# Patient Record
Sex: Male | Born: 1970
Health system: Southern US, Community
[De-identification: ages and names within clinical notes are randomized; demographics above are authoritative.]

## PROBLEM LIST (undated history)

## (undated) DIAGNOSIS — T7840XA Allergy, unspecified, initial encounter: Secondary | ICD-10-CM

## (undated) DIAGNOSIS — E119 Type 2 diabetes mellitus without complications: Secondary | ICD-10-CM

## (undated) DIAGNOSIS — I878 Other specified disorders of veins: Secondary | ICD-10-CM

## (undated) DIAGNOSIS — G4730 Sleep apnea, unspecified: Secondary | ICD-10-CM

## (undated) DIAGNOSIS — M1990 Unspecified osteoarthritis, unspecified site: Secondary | ICD-10-CM

## (undated) DIAGNOSIS — R079 Chest pain, unspecified: Secondary | ICD-10-CM

## (undated) DIAGNOSIS — R7301 Impaired fasting glucose: Secondary | ICD-10-CM

## (undated) DIAGNOSIS — M109 Gout, unspecified: Secondary | ICD-10-CM

## (undated) DIAGNOSIS — I10 Essential (primary) hypertension: Secondary | ICD-10-CM

## (undated) HISTORY — PX: FINGER SURGERY: SHX640

## (undated) HISTORY — DX: Morbid (severe) obesity due to excess calories: E66.01

## (undated) HISTORY — DX: Essential (primary) hypertension: I10

## (undated) HISTORY — DX: Chest pain, unspecified: R07.9

## (undated) HISTORY — DX: Other specified disorders of veins: I87.8

## (undated) HISTORY — DX: Allergy, unspecified, initial encounter: T78.40XA

## (undated) HISTORY — DX: Impaired fasting glucose: R73.01

## (undated) HISTORY — DX: Sleep apnea, unspecified: G47.30

---

## 2007-03-01 ENCOUNTER — Emergency Department (HOSPITAL_COMMUNITY): Admission: EM | Admit: 2007-03-01 | Discharge: 2007-03-01 | Payer: Self-pay | Admitting: Emergency Medicine

## 2007-08-29 ENCOUNTER — Emergency Department (HOSPITAL_COMMUNITY): Admission: EM | Admit: 2007-08-29 | Discharge: 2007-08-29 | Payer: Self-pay | Admitting: Emergency Medicine

## 2011-03-25 ENCOUNTER — Emergency Department (HOSPITAL_COMMUNITY)
Admission: EM | Admit: 2011-03-25 | Discharge: 2011-03-25 | Disposition: A | Discharge status: Home / Self Care | Payer: BC Managed Care – PPO | Attending: Emergency Medicine | Admitting: Emergency Medicine

## 2011-03-25 ENCOUNTER — Emergency Department (HOSPITAL_COMMUNITY): Payer: BC Managed Care – PPO

## 2011-03-25 DIAGNOSIS — R079 Chest pain, unspecified: Secondary | ICD-10-CM | POA: Insufficient documentation

## 2011-03-25 LAB — BASIC METABOLIC PANEL
BUN: 20 mg/dL (ref 6–23)
CO2: 26 mEq/L (ref 19–32)
Calcium: 8.9 mg/dL (ref 8.4–10.5)
Chloride: 107 mEq/L (ref 96–112)
GFR calc Af Amer: >60 mL/min (ref >60)
Potassium: 3.5 mEq/L (ref 3.5–5.1)
Sodium: 138 mEq/L (ref 135–145)

## 2011-03-25 LAB — DIFFERENTIAL
Basophils Absolute: 0 10*3/uL (ref 0.0–0.1)
Basophils Relative: 0 % (ref 0–1)
Eosinophils Relative: 3 % (ref 0–5)
Lymphs Abs: 2.8 10*3/uL (ref 0.7–4.0)
Monocytes Relative: 8 % (ref 3–12)

## 2011-03-25 LAB — POCT CARDIAC MARKERS
CKMB, poc: 3.5 ng/mL (ref 1.0–8.0)
Myoglobin, poc: 71.5 ng/mL (ref 12–200)

## 2011-03-25 LAB — CBC
HCT: 44.5 % (ref 39.0–52.0)
MCH: 27.6 pg (ref 26.0–34.0)
RDW: 14.5 % (ref 11.5–15.5)

## 2011-09-07 LAB — CBC
HCT: 42.3
Hemoglobin: 14.1
MCV: 81.9
RBC: 5.16
WBC: 9.8

## 2011-09-07 LAB — DIFFERENTIAL
Basophils Absolute: 0
Eosinophils Relative: 1
Lymphocytes Relative: 20
Neutro Abs: 6.9
Neutrophils Relative %: 70

## 2011-09-07 LAB — BASIC METABOLIC PANEL
BUN: 10
CO2: 29
Chloride: 108
GFR calc Af Amer: >60
Glucose, Bld: 123 — ABNORMAL HIGH

## 2011-09-07 LAB — POCT CARDIAC MARKERS
CKMB, poc: 2.8
CKMB, poc: 3
Myoglobin, poc: 117
Operator id: 166561
Troponin i, poc: <0.05

## 2011-11-29 ENCOUNTER — Emergency Department (HOSPITAL_COMMUNITY): Payer: Worker's Compensation | Admitting: Certified Registered"

## 2011-11-29 ENCOUNTER — Emergency Department (HOSPITAL_COMMUNITY): Payer: Worker's Compensation

## 2011-11-29 ENCOUNTER — Encounter (HOSPITAL_COMMUNITY): Payer: Self-pay | Admitting: Certified Registered"

## 2011-11-29 ENCOUNTER — Emergency Department (HOSPITAL_COMMUNITY)
Admission: EM | Admit: 2011-11-29 | Discharge: 2011-11-29 | Disposition: A | Discharge status: Home / Self Care | Payer: Worker's Compensation | Attending: Emergency Medicine | Admitting: Emergency Medicine

## 2011-11-29 ENCOUNTER — Encounter: Payer: Self-pay | Admitting: Emergency Medicine

## 2011-11-29 ENCOUNTER — Other Ambulatory Visit: Payer: Self-pay

## 2011-11-29 ENCOUNTER — Encounter (HOSPITAL_COMMUNITY)
Admission: EM | Disposition: A | Discharge status: Home / Self Care | Payer: Self-pay | Source: Home / Self Care | Attending: Emergency Medicine

## 2011-11-29 DIAGNOSIS — S6710XA Crushing injury of unspecified finger(s), initial encounter: Secondary | ICD-10-CM | POA: Insufficient documentation

## 2011-11-29 DIAGNOSIS — Y9269 Other specified industrial and construction area as the place of occurrence of the external cause: Secondary | ICD-10-CM | POA: Insufficient documentation

## 2011-11-29 DIAGNOSIS — S62639B Displaced fracture of distal phalanx of unspecified finger, initial encounter for open fracture: Secondary | ICD-10-CM | POA: Insufficient documentation

## 2011-11-29 DIAGNOSIS — S62639A Displaced fracture of distal phalanx of unspecified finger, initial encounter for closed fracture: Secondary | ICD-10-CM | POA: Insufficient documentation

## 2011-11-29 DIAGNOSIS — W3189XA Contact with other specified machinery, initial encounter: Secondary | ICD-10-CM | POA: Insufficient documentation

## 2011-11-29 DIAGNOSIS — S61209A Unspecified open wound of unspecified finger without damage to nail, initial encounter: Secondary | ICD-10-CM | POA: Insufficient documentation

## 2011-11-29 DIAGNOSIS — S61409A Unspecified open wound of unspecified hand, initial encounter: Secondary | ICD-10-CM | POA: Insufficient documentation

## 2011-11-29 DIAGNOSIS — Y990 Civilian activity done for income or pay: Secondary | ICD-10-CM | POA: Insufficient documentation

## 2011-11-29 HISTORY — PX: I & D EXTREMITY: SHX5045

## 2011-11-29 LAB — CBC
HCT: 44.4 % (ref 39.0–52.0)
MCHC: 33.8 g/dL (ref 30.0–36.0)
Platelets: 191 10*3/uL (ref 150–400)
RBC: 5.32 MIL/uL (ref 4.22–5.81)

## 2011-11-29 LAB — DIFFERENTIAL
Basophils Absolute: 0 10*3/uL (ref 0.0–0.1)
Basophils Relative: 0 % (ref 0–1)
Eosinophils Absolute: 0 10*3/uL (ref 0.0–0.7)
Eosinophils Relative: 0 % (ref 0–5)
Monocytes Absolute: 0.5 10*3/uL (ref 0.1–1.0)

## 2011-11-29 LAB — ETHANOL: Alcohol, Ethyl (B): <11 mg/dL (ref 0–11)

## 2011-11-29 LAB — BASIC METABOLIC PANEL
BUN: 12 mg/dL (ref 6–23)
Creatinine, Ser: 0.9 mg/dL (ref 0.50–1.35)
GFR calc Af Amer: >90 mL/min (ref >90)
Potassium: 4.1 mEq/L (ref 3.5–5.1)
Sodium: 138 mEq/L (ref 135–145)

## 2011-11-29 SURGERY — IRRIGATION AND DEBRIDEMENT EXTREMITY
Anesthesia: General | Site: Arm Lower | Laterality: Left | Wound class: Contaminated

## 2011-11-29 MED ORDER — OXYCODONE-ACETAMINOPHEN 10-325 MG PO TABS: 1.0000 | ORAL_TABLET | ORAL | Status: AC | PRN

## 2011-11-29 MED ORDER — MIDAZOLAM HCL 5 MG/5ML IJ SOLN
INTRAMUSCULAR | Status: DC | PRN
  Administered 2011-11-29: 2 mg via INTRAVENOUS

## 2011-11-29 MED ORDER — MEPERIDINE HCL 25 MG/ML IJ SOLN: 6.2500 mg | INTRAMUSCULAR | Status: DC | PRN

## 2011-11-29 MED ORDER — HYDROMORPHONE HCL PF 1 MG/ML IJ SOLN: 1.0000 mg | Freq: Once | INTRAMUSCULAR | Status: DC

## 2011-11-29 MED ORDER — PROMETHAZINE HCL 25 MG/ML IJ SOLN: 6.2500 mg | INTRAMUSCULAR | Status: DC | PRN

## 2011-11-29 MED ORDER — VITAMIN C 500 MG PO TABS: 500.0000 mg | ORAL_TABLET | Freq: Every day | ORAL | Status: DC

## 2011-11-29 MED ORDER — LACTATED RINGERS IV SOLN
INTRAVENOUS | Status: DC | PRN
  Administered 2011-11-29: 19:00:00 via INTRAVENOUS

## 2011-11-29 MED ORDER — DOCUSATE SODIUM 100 MG PO CAPS: 100.0000 mg | ORAL_CAPSULE | Freq: Two times a day (BID) | ORAL | Status: AC

## 2011-11-29 MED ORDER — CEPHALEXIN 500 MG PO CAPS: 500.0000 mg | ORAL_CAPSULE | Freq: Four times a day (QID) | ORAL | Status: AC

## 2011-11-29 MED ORDER — CEFAZOLIN SODIUM 1-5 GM-% IV SOLN
1.0000 g | Freq: Once | INTRAVENOUS | Status: AC
  Administered 2011-11-29: 1 g via INTRAVENOUS
  Filled 2011-11-29: qty 50

## 2011-11-29 MED ORDER — PROPOFOL 10 MG/ML IV BOLUS
INTRAVENOUS | Status: DC | PRN
  Administered 2011-11-29: 300 mg via INTRAVENOUS

## 2011-11-29 MED ORDER — SODIUM CHLORIDE 0.9 % IR SOLN
Status: DC | PRN
  Administered 2011-11-29: 1000 mL

## 2011-11-29 MED ORDER — FENTANYL CITRATE 0.05 MG/ML IJ SOLN
INTRAMUSCULAR | Status: DC | PRN
  Administered 2011-11-29: 50 ug via INTRAVENOUS
  Administered 2011-11-29: 100 ug via INTRAVENOUS

## 2011-11-29 MED ORDER — ONDANSETRON HCL 4 MG/2ML IJ SOLN
INTRAMUSCULAR | Status: DC | PRN
  Administered 2011-11-29: 4 mg via INTRAVENOUS

## 2011-11-29 MED ORDER — HYDROMORPHONE HCL PF 1 MG/ML IJ SOLN
1.0000 mg | Freq: Once | INTRAMUSCULAR | Status: AC
  Administered 2011-11-29: 1 mg via INTRAVENOUS
  Filled 2011-11-29: qty 1

## 2011-11-29 MED ORDER — BUPIVACAINE HCL (PF) 0.25 % IJ SOLN
INTRAMUSCULAR | Status: DC | PRN
  Administered 2011-11-29: 10 mL

## 2011-11-29 MED ORDER — CEFAZOLIN SODIUM 1-5 GM-% IV SOLN
INTRAVENOUS | Status: DC | PRN
  Administered 2011-11-29: 2 g via INTRAVENOUS

## 2011-11-29 MED ORDER — HYDROMORPHONE HCL PF 1 MG/ML IJ SOLN
0.2500 mg | INTRAMUSCULAR | Status: DC | PRN
  Administered 2011-11-29: 0.5 mg via INTRAVENOUS
  Administered 2011-11-29: 0.25 mg via INTRAVENOUS

## 2011-11-29 MED ORDER — SODIUM CHLORIDE 0.9 % IV SOLN
INTRAVENOUS | Status: DC | PRN
  Administered 2011-11-29: 19:00:00 via INTRAVENOUS

## 2011-11-29 MED ORDER — TETANUS-DIPHTH-ACELL PERTUSSIS 5-2.5-18.5 LF-MCG/0.5 IM SUSP
0.5000 mL | Freq: Once | INTRAMUSCULAR | Status: AC
  Administered 2011-11-29: 0.5 mL via INTRAMUSCULAR
  Filled 2011-11-29: qty 0.5

## 2011-11-29 SURGICAL SUPPLY — 56 items
BANDAGE CO FLEX L/F 1IN X 5YD (GAUZE/BANDAGES/DRESSINGS) ×1 IMPLANT
BANDAGE CONFORM 2  STR LF (GAUZE/BANDAGES/DRESSINGS) ×1 IMPLANT
BANDAGE CONFORM 2X5YD N/S (GAUZE/BANDAGES/DRESSINGS) ×1 IMPLANT
BANDAGE ELASTIC 3 VELCRO ST LF (GAUZE/BANDAGES/DRESSINGS) ×1 IMPLANT
BANDAGE ELASTIC 4 VELCRO ST LF (GAUZE/BANDAGES/DRESSINGS) ×2 IMPLANT
BANDAGE GAUZE ELAST BULKY 4 IN (GAUZE/BANDAGES/DRESSINGS) ×2 IMPLANT
BNDG CMPR 9X4 STRL LF SNTH (GAUZE/BANDAGES/DRESSINGS) ×1
BNDG COHESIVE 1X5 TAN STRL LF (GAUZE/BANDAGES/DRESSINGS) ×1 IMPLANT
BNDG ESMARK 4X9 LF (GAUZE/BANDAGES/DRESSINGS) ×2 IMPLANT
CLOTH BEACON ORANGE TIMEOUT ST (SAFETY) ×2 IMPLANT
CORDS BIPOLAR (ELECTRODE) ×2 IMPLANT
COVER SURGICAL LIGHT HANDLE (MISCELLANEOUS) ×2 IMPLANT
CUFF TOURNIQUET SINGLE 18IN (TOURNIQUET CUFF) ×1 IMPLANT
CUFF TOURNIQUET SINGLE 24IN (TOURNIQUET CUFF) ×1 IMPLANT
DRAIN PENROSE 1/4X12 LTX STRL (WOUND CARE) IMPLANT
DRAPE SURG 17X23 STRL (DRAPES) ×2 IMPLANT
DRSG ADAPTIC 3X8 NADH LF (GAUZE/BANDAGES/DRESSINGS) ×2 IMPLANT
DRSG EMULSION OIL 3X16 NADH (GAUZE/BANDAGES/DRESSINGS) ×1 IMPLANT
ELECT REM PT RETURN 9FT ADLT (ELECTROSURGICAL)
ELECTRODE REM PT RTRN 9FT ADLT (ELECTROSURGICAL) IMPLANT
GAUZE XEROFORM 1X8 LF (GAUZE/BANDAGES/DRESSINGS) ×1 IMPLANT
GAUZE XEROFORM 5X9 LF (GAUZE/BANDAGES/DRESSINGS) IMPLANT
GLOVE BIOGEL PI IND STRL 8.5 (GLOVE) ×1 IMPLANT
GLOVE BIOGEL PI INDICATOR 8.5 (GLOVE) ×1
GLOVE SURG ORTHO 8.0 STRL STRW (GLOVE) ×2 IMPLANT
GOWN PREVENTION PLUS XLARGE (GOWN DISPOSABLE) ×2 IMPLANT
GOWN STRL NON-REIN LRG LVL3 (GOWN DISPOSABLE) ×6 IMPLANT
HANDPIECE INTERPULSE COAX TIP (DISPOSABLE)
KIT BASIN OR (CUSTOM PROCEDURE TRAY) ×2 IMPLANT
KIT ROOM TURNOVER OR (KITS) ×2 IMPLANT
MANIFOLD NEPTUNE II (INSTRUMENTS) ×1 IMPLANT
NDL HYPO 25GX1X1/2 BEV (NEEDLE) IMPLANT
NEEDLE HYPO 25GX1X1/2 BEV (NEEDLE) ×2 IMPLANT
NS IRRIG 1000ML POUR BTL (IV SOLUTION) ×2 IMPLANT
PACK ORTHO EXTREMITY (CUSTOM PROCEDURE TRAY) ×2 IMPLANT
PAD ARMBOARD 7.5X6 YLW CONV (MISCELLANEOUS) ×3 IMPLANT
PAD CAST 4YDX4 CTTN HI CHSV (CAST SUPPLIES) ×1 IMPLANT
PADDING CAST COTTON 4X4 STRL (CAST SUPPLIES) ×2
SET HNDPC FAN SPRY TIP SCT (DISPOSABLE) IMPLANT
SOAP 2 % CHG 4 OZ (WOUND CARE) ×2 IMPLANT
SPONGE GAUZE 4X4 12PLY (GAUZE/BANDAGES/DRESSINGS) ×2 IMPLANT
SPONGE GAUZE 4X4 FOR O.R. (GAUZE/BANDAGES/DRESSINGS) ×1 IMPLANT
SPONGE LAP 18X18 X RAY DECT (DISPOSABLE) ×2 IMPLANT
SPONGE LAP 4X18 X RAY DECT (DISPOSABLE) ×2 IMPLANT
SUCTION FRAZIER TIP 10 FR DISP (SUCTIONS) ×2 IMPLANT
SUT ETHILON 4 0 PS 2 18 (SUTURE) IMPLANT
SUT ETHILON 5 0 P 3 18 (SUTURE)
SUT NYLON ETHILON 5-0 P-3 1X18 (SUTURE) ×1 IMPLANT
SYR CONTROL 10ML LL (SYRINGE) ×1 IMPLANT
TOWEL OR 17X24 6PK STRL BLUE (TOWEL DISPOSABLE) ×2 IMPLANT
TOWEL OR 17X26 10 PK STRL BLUE (TOWEL DISPOSABLE) ×2 IMPLANT
TUBE ANAEROBIC SPECIMEN COL (MISCELLANEOUS) IMPLANT
TUBE CONNECTING 12X1/4 (SUCTIONS) ×2 IMPLANT
UNDERPAD 30X30 INCONTINENT (UNDERPADS AND DIAPERS) ×2 IMPLANT
WATER STERILE IRR 1000ML POUR (IV SOLUTION) ×1 IMPLANT
YANKAUER SUCT BULB TIP NO VENT (SUCTIONS) ×2 IMPLANT

## 2011-11-29 NOTE — Anesthesia Preprocedure Evaluation (Addendum)
Anesthesia Evaluation  Patient identified by MRN, date of birth, ID band Patient awake    Reviewed: Allergy & Precautions, H&P , NPO status , Patient's Chart, lab work & pertinent test results  History of Anesthesia Complications Negative for: history of anesthetic complications  Airway Mallampati: II TM Distance: >3 FB Neck ROM: Full    Dental No notable dental hx. (+) Teeth Intact   Pulmonary neg pulmonary ROS,  clear to auscultation  Pulmonary exam normal       Cardiovascular neg cardio ROS regular Normal    Neuro/Psych Negative Neurological ROS  Negative Psych ROS   GI/Hepatic negative GI ROS, Neg liver ROS,   Endo/Other  Negative Endocrine ROS  Renal/GU negative Renal ROS  Genitourinary negative   Musculoskeletal   Abdominal   Peds  Hematology negative hematology ROS (+)   Anesthesia Other Findings   Reproductive/Obstetrics negative OB ROS                          Anesthesia Physical Anesthesia Plan  ASA: II  Anesthesia Plan: General LMA   Post-op Pain Management:    Induction:   Airway Management Planned:   Additional Equipment:   Intra-op Plan:   Post-operative Plan:   Informed Consent: I have reviewed the patients History and Physical, chart, labs and discussed the procedure including the risks, benefits and alternatives for the proposed anesthesia with the patient or authorized representative who has indicated his/her understanding and acceptance.     Plan Discussed with: CRNA and Surgeon  Anesthesia Plan Comments:         Anesthesia Quick Evaluation

## 2011-11-29 NOTE — Anesthesia Procedure Notes (Addendum)
Performed by: Julianne Rice Z   Procedure Name: LMA Insertion Date/Time: 11/29/2011 7:44 PM Performed by: Pricilla Holm Pre-anesthesia Checklist: Patient identified, Timeout performed, Emergency Drugs available, Suction available and Patient being monitored Patient Re-evaluated:Patient Re-evaluated prior to inductionOxygen Delivery Method: Circle System Utilized Preoxygenation: Pre-oxygenation with 100% oxygen Intubation Type: IV induction Ventilation: Mask ventilation without difficulty LMA: LMA inserted LMA Size: 4.0 Number of attempts: 1 Tube secured with: Tape Dental Injury: Teeth and Oropharynx as per pre-operative assessment

## 2011-11-29 NOTE — ED Notes (Signed)
SPOKE WITH HEATHER PA AND SHE ADVISES TO MOVE PT TO CDU TO AWAIT HAND SURGEON

## 2011-11-29 NOTE — ED Notes (Signed)
Pt has crushing injury to left hand.  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  Per EMS the last two fingers were crushed.  Vitals have remained stable and pulses are good. No history and no allergies

## 2011-11-29 NOTE — ED Provider Notes (Signed)
History     CSN: [id number]  Arrival date & time 11/29/11  4782   First MD Initiated Contact with Patient 11/29/11 [phone]      Chief Complaint  Patient presents with  . Hand Injury    (Consider location/radiation/quality/duration/timing/severity/associated sxs/prior treatment) HPI Comments: Injury occurred just prior to arrival.  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  He is having considerable amount of pain of the 3rd and 4th digit.  No other symptoms at this time.  He is unsure of the date of his last tetanus.   He reports that he is otherwise healthy.  Patient is a 41 y.o. male presenting with hand injury. The history is provided by the patient.  Hand Injury  Incident onset: just prior to arrival. The incident occurred at work. The pain is severe. The pain has been constant since the incident. Pertinent negatives include no fever. The symptoms are aggravated by movement.    History reviewed. No pertinent past medical history.  History reviewed. No pertinent past surgical history.  History reviewed. No pertinent family history.  History  Substance Use Topics  . Smoking status: Never Smoker   . Smokeless tobacco: Not on file  . Alcohol Use: No      Review of Systems  Constitutional: Negative for fever and chills.  Respiratory: Negative for shortness of breath.   Cardiovascular: Negative for chest pain.  Musculoskeletal: Negative for gait problem.  Neurological: Negative for dizziness, syncope and light-headedness.  Psychiatric/Behavioral: Negative for confusion.    Allergies  Review of patient's allergies indicates no known allergies.  Home Medications   Current Outpatient Rx  Name Route Sig Dispense Refill  . CEPHALEXIN 500 MG PO CAPS Oral Take 1 capsule (500 mg total) by mouth 4 (four) times daily. 28 capsule 0  . DOCUSATE SODIUM 100 MG PO CAPS Oral Take 1 capsule (100 mg total) by mouth 2  (two) times daily. 30 capsule 0  . OXYCODONE-ACETAMINOPHEN 10-325 MG PO TABS Oral Take 1 tablet by mouth every 4 (four) hours as needed for pain. 40 tablet 0  . VITAMIN C 500 MG PO TABS Oral Take 1 tablet (500 mg total) by mouth daily. 60 tablet 0    BP 122/78  Pulse 69  Temp(Src) 98.4 F (36.9 C) (Oral)  Resp 20  SpO2 96%  Physical Exam  Nursing note and vitals reviewed. Constitutional: He is oriented to person, place, and time. He appears well-developed and well-nourished.       Obese  HENT:  Head: Normocephalic and atraumatic.  Cardiovascular: Normal rate, regular rhythm and normal heart sounds.   Pulmonary/Chest: Effort normal and breath sounds normal. No respiratory distress.  Musculoskeletal:       Laceration of the 3rd and 4th digit palmer surface. Laceration on 4th digit exposing the flexor tendon.  Patient unable to move the DIP joint on both the 3rd and 4th digit.  Suspect injury to the flexor tendon.    Neurological: He is alert and oriented to person, place, and time.  Psychiatric: He has a normal mood and affect.    ED Course  Procedures (including critical care time)  Labs Reviewed  BASIC METABOLIC PANEL - Abnormal; Notable for the following:    Glucose, Bld 101 (*)    All other components within normal limits  DIFFERENTIAL - Abnormal; Notable for the following:    Neutro Abs 7.9 (*)  All other components within normal limits  CBC  ETHANOL  I-STAT, CHEM 8  DRUG SCREEN PANEL (SERUM)   Dg Chest 2 View  11/29/2011  *RADIOLOGY REPORT*  Clinical Data: Left hand injury. Pre-op respiratory exam.  CHEST - 2 VIEW  Comparison:  03/25/2011  Findings:  The heart size and mediastinal contours are within normal limits.  Both lungs are clear.  The visualized skeletal structures are unremarkable.  IMPRESSION: No active cardiopulmonary disease.  Original Report Authenticated By: Danae Orleans, M.D.   Dg Hand Complete Left  11/29/2011  *RADIOLOGY REPORT*  Clinical Data:  Trauma.  LEFT HAND - COMPLETE 3+ VIEW  Comparison: None.  Findings: Comminuted fracture of the left fourth distal phalanx.  Fracture of the tuft of the left third distal phalanx.  Significant soft tissue injury left third and fourth finger.  IMPRESSION: Comminuted fracture of the left fourth distal phalanx.  Fracture of the tuft of the left third distal phalanx.  Significant soft tissue injury left third and fourth finger.  Original Report Authenticated By: Fuller Canada, M.D.     1. Open wound of finger with tendon involvement     Patient given Ancef 1g IV and tetanus shot while in the Ed.  8:28 PM Discussed with Dr. Melvyn Novas with Hand Surgery.  He recommends putting the hand in a betadine dressing.  He is also recommending ordering pre op labs.  He is in surgery at the time and states that he will not be able to see the patient for some time. 11:00 Am Patient will be moved to the CDU for pain management while awaiting seeing Hand.  Patient signed out to Remi Haggard, NP. 8:28 PM Received call from Horald Pollen with Employee Health.  She reports that the company that the patient works for works along with [employer] and their policy requires an UDS and blood EtOH after a work related injury.  Will order UDS and EtOH   MDM  Patient with large finger laceration and xray showing comminuted fracture of the 4th digit.  Flexor tendon exposed.  Patient unable to flex the DIP joint. Therefore, feel that there is most likely tendon injury.  Can consulted for possible surgery.  Patient NPO and preop labs ordered.        Pascal Lux Maralyn Sago 11/29/11 2032

## 2011-11-29 NOTE — ED Notes (Signed)
Per ems pt has had 8 total morphine

## 2011-11-29 NOTE — ED Provider Notes (Signed)
History     CSN: [id number]  Arrival date & time 11/29/11  7829   First MD Initiated Contact with Patient 11/29/11 [phone]      Chief Complaint  Patient presents with  . Hand Injury    (Consider location/radiation/quality/duration/timing/severity/associated sxs/prior treatment) HPI  History reviewed. No pertinent past medical history.  History reviewed. No pertinent past surgical history.  History reviewed. No pertinent family history.  History  Substance Use Topics  . Smoking status: Never Smoker   . Smokeless tobacco: Not on file  . Alcohol Use: No      Review of Systems  Allergies  Review of patient's allergies indicates no known allergies.  Home Medications   Current Outpatient Rx  Name Route Sig Dispense Refill  . IBUPROFEN 200 MG PO TABS Oral Take 800 mg by mouth every 6 (six) hours as needed. pain       BP 147/91  Pulse 80  Temp(Src) 98.3 F (36.8 C) (Oral)  Resp 10  SpO2 95%  Physical Exam  ED Course  Procedures (including critical care time)   Labs Reviewed  I-STAT, CHEM 8  BASIC METABOLIC PANEL  CBC  DIFFERENTIAL  URINE RAPID DRUG SCREEN (HOSP PERFORMED)  ETHANOL   Dg Chest 2 View  11/29/2011  *RADIOLOGY REPORT*  Clinical Data: Left hand injury. Pre-op respiratory exam.  CHEST - 2 VIEW  Comparison:  03/25/2011  Findings:  The heart size and mediastinal contours are within normal limits.  Both lungs are clear.  The visualized skeletal structures are unremarkable.  IMPRESSION: No active cardiopulmonary disease.  Original Report Authenticated By: Danae Orleans, M.D.   Dg Hand Complete Left  11/29/2011  *RADIOLOGY REPORT*  Clinical Data: Trauma.  LEFT HAND - COMPLETE 3+ VIEW  Comparison: None.  Findings: Comminuted fracture of the left fourth distal phalanx.  Fracture of the tuft of the left third distal phalanx.  Significant soft tissue injury left third and fourth finger.  IMPRESSION: Comminuted fracture of the left fourth distal phalanx.   Fracture of the tuft of the left third distal phalanx.  Significant soft tissue injury left third and fourth finger.  Original Report Authenticated By: Fuller Canada, M.D.     No diagnosis found.    MDM  11:30am Report received from [facility] for CDU holding after left hand injury at work. Patient sustained community fracture of the left fourth distal phalanx, fracture of the tuft of the left third distal phalanx. Patient awaiting hand surgeon to see him in the CDU 12. We'll keep him n.p.o. Good left radial pulse. Hand is wrapped bleeding controlled.   14:30 Workmans comp called to see why etoh was cancelled.  ETOH reordered.  Awaiting Dr. Orlan Leavens to arrive.  Providing pain mediacation.    1530 Report given to lisette PA. Awaiting Dr. Melvyn Novas hand surgeon after crushing injury to his hand. Ancef 1 gm IV in the ER.  Dilaudid for pain control.      Jethro Bastos, NP 11/29/11 1606

## 2011-11-29 NOTE — Transfer of Care (Signed)
Immediate Anesthesia Transfer of Care Note  Patient: James Rivas  Procedure(s) Performed:  IRRIGATION AND DEBRIDEMENT EXTREMITY - I&D Right Long and Index Fingers with Tendon Repair as Needed  Patient Location: PACU  Anesthesia Type: General  Level of Consciousness: awake, alert  and oriented  Airway & Oxygen Therapy: Patient Spontanous Breathing and Patient connected to nasal cannula oxygen  Post-op Assessment: Report given to PACU RN and Post -op Vital signs reviewed and stable  Post vital signs: Reviewed and stable  Complications: No apparent anesthesia complications

## 2011-11-29 NOTE — H&P (Signed)
James Rivas is an 41 y.o. male.   Chief Complaint: CRUSH INJURY TO LEFT LONG AND RING FINGERS HPI: PT AT WORK GOT FINGERS CAUGHT IN DEVICE. TRIED TO GET THEM OUT AND SUSTAINED INJURIES TO LEFT LONG AND RING FINGERS. PT C/O PAIN TO LEFT RING AND LONG FINGERS.  History reviewed. No pertinent past medical history.  History reviewed. No pertinent past surgical history.  History reviewed. No pertinent family history. Social History:  reports that he has never smoked. He does not have any smokeless tobacco history on file. He reports that he does not drink alcohol. His drug history not on file.  Allergies: No Known Allergies  Medications Prior to Admission  Medication Dose Route Frequency Provider Last Rate Last Dose  . ceFAZolin (ANCEF) IVPB 1 g/50 mL premix  1 g Intravenous Once [facility] Wingen   1 g at 11/29/11 0955  . HYDROmorphone (DILAUDID) injection 1 mg  1 mg Intravenous Once Pascal Lux Wingen   1 mg at 11/29/11 4098  . HYDROmorphone (DILAUDID) injection 1 mg  1 mg Intravenous Once Jethro Bastos, NP   1 mg at 11/29/11 1206  . HYDROmorphone (DILAUDID) injection 1 mg  1 mg Intravenous Once Jethro Bastos, NP   1 mg at 11/29/11 1352  . TDaP (BOOSTRIX) injection 0.5 mL  0.5 mL Intramuscular Once Pascal Lux Wingen   0.5 mL at 11/29/11 0955  . DISCONTD: HYDROmorphone (DILAUDID) injection 1 mg  1 mg Intravenous Once Forbes Cellar, MD       No current outpatient prescriptions on file as of 11/29/2011.    Results for orders placed during the hospital encounter of 11/29/11 (from the past 48 hour(s))  BASIC METABOLIC PANEL     Status: Abnormal   Collection Time   11/29/11 11:13 AM      Component Value Range Comment   Sodium 138  135 - 145 (mEq/L)    Potassium 4.1  3.5 - 5.1 (mEq/L)    Chloride 104  96 - 112 (mEq/L)    CO2 23  19 - 32 (mEq/L)    Glucose, Bld 101 (*) 70 - 99 (mg/dL)    BUN 12  6 - 23 (mg/dL)    Creatinine, Ser 1.19  0.50 - 1.35 (mg/dL)    Calcium 8.9  8.4 - 10.5  (mg/dL)    GFR calc non Af Amer >90  >90 (mL/min)    GFR calc Af Amer >90  >90 (mL/min)   CBC     Status: Normal   Collection Time   11/29/11 11:13 AM      Component Value Range Comment   WBC 10.3  4.0 - 10.5 (K/uL)    RBC 5.32  4.22 - 5.81 (MIL/uL)    Hemoglobin 15.0  13.0 - 17.0 (g/dL)    HCT 14.7  82.9 - 56.2 (%)    MCV 83.5  78.0 - 100.0 (fL)    MCH 28.2  26.0 - 34.0 (pg)    MCHC 33.8  30.0 - 36.0 (g/dL)    RDW 13.0  86.5 - 78.4 (%)    Platelets 191  150 - 400 (K/uL)   DIFFERENTIAL     Status: Abnormal   Collection Time   11/29/11 11:13 AM      Component Value Range Comment   Neutrophils Relative 77  43 - 77 (%)    Neutro Abs 7.9 (*) 1.7 - 7.7 (K/uL)    Lymphocytes Relative 18  12 - 46 (%)  Lymphs Abs 1.9  0.7 - 4.0 (K/uL)    Monocytes Relative 5  3 - 12 (%)    Monocytes Absolute 0.5  0.1 - 1.0 (K/uL)    Eosinophils Relative 0  0 - 5 (%)    Eosinophils Absolute 0.0  0.0 - 0.7 (K/uL)    Basophils Relative 0  0 - 1 (%)    Basophils Absolute 0.0  0.0 - 0.1 (K/uL)   ETHANOL     Status: Normal   Collection Time   11/29/11  2:44 PM      Component Value Range Comment   Alcohol, Ethyl (B) <11  0 - 11 (mg/dL)    Dg Chest 2 View  0/06/6577  *RADIOLOGY REPORT*  Clinical Data: Left hand injury. Pre-op respiratory exam.  CHEST - 2 VIEW  Comparison:  03/25/2011  Findings:  The heart size and mediastinal contours are within normal limits.  Both lungs are clear.  The visualized skeletal structures are unremarkable.  IMPRESSION: No active cardiopulmonary disease.  Original Report Authenticated By: Danae Orleans, M.D.   Dg Hand Complete Left  11/29/2011  *RADIOLOGY REPORT*  Clinical Data: Trauma.  LEFT HAND - COMPLETE 3+ VIEW  Comparison: None.  Findings: Comminuted fracture of the left fourth distal phalanx.  Fracture of the tuft of the left third distal phalanx.  Significant soft tissue injury left third and fourth finger.  IMPRESSION: Comminuted fracture of the left fourth distal phalanx.   Fracture of the tuft of the left third distal phalanx.  Significant soft tissue injury left third and fourth finger.  Original Report Authenticated By: Fuller Canada, M.D.    NO RECENT ILLNESSES  Blood pressure 122/78, pulse 69, temperature 98.4 F (36.9 C), temperature source Oral, resp. rate 20, SpO2 96.00%. General Appearance:  Alert, cooperative, no distress, appears stated age  Head:  Normocephalic, without obvious abnormality, atraumatic  Eyes:  Pupils equal, conjunctiva/corneas clear,         Throat: Lips, mucosa, and tongue normal; teeth and gums normal  Neck: No visible masses     Lungs:   respirations unlabored  Chest Wall:  No tenderness or deformity  Heart:  Regular rate and rhythm,  Abdomen:   Soft, non-tender,         Extremities: LEFT HAND: LEFT LONG FINGER PARTIAL THICKNESS SKIN LOSS FROM DIP DISTALLY, DERMIS STILL INTACT. ABLE TO FLEX  IP JOINT AND PIP JOINT LEFT RING FINGER OPEN WOUND WITH EXPOSED FLEXOR TENDON ABLE TO FLEX DIP JOINT BUT NOT FULL MOBILITY PARTIAL THICKNESS SKIN LOSS FROM DIP DISTALLY NO INJURY TO INDEX/SMALL GO THUMB MOBILITY   Pulses: 2+ and symmetric  Skin: Skin color, texture, turgor normal, no rashes or lesions     Neurologic: Normal    Assessment/Plan LEFT LONG AND RING FINGER CRUSH INJURIES WITH DISTAL PHALANX FRACTURES  TO OR FOR DEBRIDEMENT AND REPAIR AS INDICATED  R/B/A DISCUSSED WITH PT IN OFFICE.  PT VOICED UNDERSTANDING OF PLAN CONSENT SIGNED DAY OF SURGERY PT SEEN AND EXAMINED PRIOR TO OPERATIVE PROCEDURE/DAY OF SURGERY SITE MARKED. QUESTIONS ANSWERED WILL GO HOME FOLLOWING SURGERY  Sharma Covert 11/29/2011, 7:10 PM

## 2011-11-29 NOTE — ED Notes (Signed)
Called or and spoke with cynthia. She advises will be around 1.5-2 hours before they are ready for pt

## 2011-11-29 NOTE — ED Notes (Signed)
4x4 soaked, betadine dressing with curlex

## 2011-11-29 NOTE — ED Notes (Signed)
CALLED VOLERIA IN THE LAB BECAUSE THE EARLIER CONVERSATION TO GET PT URINE DRUG SCREEN AS WELL BLOOD ALCOHOL FOR WORKMANS COMP WAS NOT COMMUNICATED TO PHLEBOTOMY. SHE WILL SPEAK WITH TONYA  AND GET THE PROPER CHAIN OF CUSTODY PAPERS AND LABS DONE

## 2011-11-29 NOTE — ED Notes (Signed)
CALLED LAB ABOUT NEW REQUEST FOR A BLOOD ETOH, AND URINE DRUG SCREEN. SPOKE WITH JAQUETTA.

## 2011-11-29 NOTE — ED Notes (Signed)
Lab reorder. Lab stated could not draw labs to IV running.

## 2011-11-29 NOTE — Brief Op Note (Signed)
11/29/2011  8:32 PM  PATIENT:  James Rivas  41 y.o. male  PRE-OPERATIVE DIAGNOSIS:  Right hand injury  POST-OPERATIVE DIAGNOSIS:  RIGHT LONG AND RING FINGER CRUSH INJURIES  PROCEDURE:  Procedure(s): IRRIGATION AND DEBRIDEMENT EXTREMITY  SURGEON:  Surgeon(s): Sharma Covert  PHYSICIAN ASSISTANT:   ASSISTANTS: none   ANESTHESIA:   general  EBL:  Total I/O In: 500 [I.V.:500] Out: -   BLOOD ADMINISTERED:none  DRAINS: none   LOCAL MEDICATIONS USED:  MARCAINE 10CC  SPECIMEN:  No Specimen  DISPOSITION OF SPECIMEN:  N/A  COUNTS:  YES  TOURNIQUET:   Total Tourniquet Time Documented: Upper Arm (Left) - 17 minutes  DICTATION: .Other Dictation: Dictation Number [id number]  PLAN OF CARE: Discharge to home after PACU  PATIENT DISPOSITION:  PACU - hemodynamically stable.   Delay start of Pharmacological VTE agent (>24hrs) due to surgical blood loss or risk of bleeding:  {YES/NO/NOT APPLICABLE:20182

## 2011-11-29 NOTE — ED Notes (Signed)
OR IS READY FOR PT

## 2011-11-29 NOTE — Anesthesia Postprocedure Evaluation (Signed)
  Anesthesia Post-op Note  Patient: James Rivas  Procedure(s) Performed:  IRRIGATION AND DEBRIDEMENT EXTREMITY - I&D Right Long and Index Fingers with Tendon Repair as Needed  Patient Location: PACU  Anesthesia Type: General  Level of Consciousness: awake  Airway and Oxygen Therapy: Patient Spontanous Breathing  Post-op Pain: mild  Post-op Assessment: Post-op Vital signs reviewed, Patient's Cardiovascular Status Stable, Respiratory Function Stable, Patent Airway and No signs of Nausea or vomiting  Post-op Vital Signs: Reviewed and stable  Complications: No apparent anesthesia complications

## 2011-11-29 NOTE — ED Notes (Signed)
Pt family on call bell. Explained to pt and family for the 4th time that the hand surgeon on call usually does not come in to the er until after 530 or so. Most times it is after 6.

## 2011-11-29 NOTE — ED Notes (Signed)
PT UNDRESSED TO GO TO OR. URINAL TO BEDSIDE FOR PT TO VOID

## 2011-11-29 NOTE — Preoperative (Signed)
Beta Blockers   Reason not to administer Beta Blockers:Not Applicable 

## 2011-11-30 ENCOUNTER — Encounter (HOSPITAL_COMMUNITY): Payer: Self-pay | Admitting: Orthopedic Surgery

## 2011-11-30 NOTE — Op Note (Signed)
NAMEVolney, [name]                   ACCOUNT NO.:  [id number]  MEDICAL RECORD NO.:  [id number]  LOCATION:  [facility]                         FACILITY:  [facility]  PHYSICIAN:  Madelynn Done, MD  DATE OF BIRTH:  Dec 09, 1970  DATE OF PROCEDURE:  11/29/2011 DATE OF DISCHARGE:  11/29/2011                              OPERATIVE REPORT   PREOPERATIVE DIAGNOSES: 1. Left ring finger distal phalanx fracture. 2. Left long finger open distal phalanx fracture with an open wound     and flexor tendon injury.  POSTOPERATIVE DIAGNOSES: 1. Left ring finger distal phalanx fracture. 2. Left long finger open distal phalanx fracture with an open wound     and flexor tendon injury.  ATTENDING PHYSICIAN:  Madelynn Done, MD, who scrubbed and present for James entire procedure.  ASSISTANT SURGEON:  None.  ANESTHESIA:  General via LMA.  TOURNIQUET TIME:  30 minutes at 250 mmHg.  SURGICAL PROCEDURE: 1. Debridement of skin and subcutaneous tissue, left long finger,     excisional debridement. 2. Closed treatment of left long finger distal phalanx fracture     without internal fixation. 3. Left ring finger excisional debridement of open fracture, skin,     subcutaneous and bone, left ring finger. 4. Left long finger flexor tendon repair, FDP zone 1 side-to-side     repair. 5. Left long finger, open treatment of distal phalangeal fracture     without internal fixation. 6. Left long finger V-Y advancement flap closure. 7. Left long finger rotational flap. 8. Radiographs 2 views of left long and ring finger.  SURGICAL INDICATIONS:  James Rivas is a right-hand-dominant gentleman who sustained a crushing injury to his left long and ring finger.  James Rivas was seen and evaluated in James ER and recommended he undergo James above procedure.  Risks, benefits, and alternatives were discussed in detail with James Rivas and a signed informed consent was obtained. Risks include, but not limited to bleeding, infection,  damage to nearby nerves, arteries, or tendons, loss of motion of James elbow, wrist and digits, and need for further surgical intervention.  DESCRIPTION OF PROCEDURE:  James Rivas was properly identified in James preop holding area and mark with a permanent marker, made on James left long and ring finger indicate James correct operative site.  James Rivas was then brought back up to James operating room and placed supine on James anesthesia room table.  General anesthesia was administered.  James Rivas tolerated this well.  A well-padded tourniquet was then placed on left brachium and sealed with 1000 drape.  James Rivas received preoperative antibiotics.  Left upper extremity was then prepped and draped in normal sterile fashion.  Time-out was called, correct side was identified, and procedure was then begun.  Attention was then turned to James left long finger where excisional debridement of James skin and subcutaneous tissue was then carried out.  James Rivas did have a partial skin loss, but no exposed bone or tendon.  James skin area was then thoroughly irrigated.  James Rivas did have James underlying fracture, but did not appear to communicate with James fracture site. Therefore, closed treatment was continued.  After this, attention was then turned to James ring finger, which was James most involved injury.  James Rivas did have an open wound with James underlying distal phalanx fracture.  Excisional debridement of James skin, subcutaneous tissue, tendon all James way down James bone was then carried out with James open fracture site.  After open debridement, James wound was then thoroughly irrigated.  James Rivas did have a laceration of James flexor tendon with approximately 20% of James tendon involved.  James tendon edges were then trimmed in order to prevent any difficulty with flexion and extension arc distally.  James gap that was left was then sewn and repair of James FDP with side-to-side 4-0 Ethibond suture closing  James defect from where James excision was then carried out.  After repair of James tendon, James wound was then irrigated.  James Rivas did have approximately less than 1 cm defect of skin loss, a small rotational V-Y flap was then carried out and this was then used to advance and close in James soft tissue defect nicely and then James skin was then closed using simple Prolene suture. Final radiographs were then obtained.  A 5 mL of 0.25% Marcaine infiltrated with local flexor sheath block into James both digits.  James Rivas tolerated this well.  Adaptic dressing, sterile compressive bandages were then applied.  James Rivas was then returned to James recovery room in good condition.  POSTOPERATIVE PLAN:  James Rivas was discharged home, seen back in office in approximately 7 days for wound check and then therapy appointment, small-tip protector, splints and local wound care modalities, radiographs at James first visit.  Continue to follow him as James wound continue to heal.     Madelynn Done, MD     FWO/MEDQ  D:  11/29/2011  T:  11/30/2011  Job:  [id number]

## 2011-12-01 NOTE — ED Provider Notes (Signed)
Medical screening examination/treatment/procedure(s) were conducted as a shared visit with non-physician practitioner(s) and myself.  I personally evaluated the patient during the encounter  See original note  Forbes Cellar, MD 12/01/11 [phone]

## 2011-12-01 NOTE — ED Provider Notes (Signed)
Medical screening examination/treatment/procedure(s) were conducted as a shared visit with non-physician practitioner(s) and myself.  I personally evaluated the patient during the encounter  Lt 2nd and 3rd digits with open fx. Flexor tendon exposed 3rd digit. Unable to full flex ? Pain vs tendon injury. Given tetanus, Ancef. Hand surgery to see. Placed in CDU holding.     Forbes Cellar, MD 12/01/11 [phone]

## 2011-12-04 LAB — DRUG SCREEN PANEL (SERUM)

## 2012-01-26 ENCOUNTER — Emergency Department (HOSPITAL_COMMUNITY)
Admission: EM | Admit: 2012-01-26 | Discharge: 2012-01-27 | Disposition: A | Discharge status: Home / Self Care | Payer: BC Managed Care – PPO | Attending: Emergency Medicine | Admitting: Emergency Medicine

## 2012-01-26 DIAGNOSIS — R079 Chest pain, unspecified: Secondary | ICD-10-CM | POA: Insufficient documentation

## 2012-01-26 NOTE — ED Notes (Signed)
Chest pain, onset 7am Friday.  No cough,

## 2012-01-27 ENCOUNTER — Emergency Department (HOSPITAL_COMMUNITY): Payer: BC Managed Care – PPO

## 2012-01-27 ENCOUNTER — Encounter (HOSPITAL_COMMUNITY): Payer: Self-pay | Admitting: *Deleted

## 2012-01-27 ENCOUNTER — Other Ambulatory Visit: Payer: Self-pay

## 2012-01-27 LAB — BASIC METABOLIC PANEL
Chloride: 102 mEq/L (ref 96–112)
GFR calc Af Amer: >90 mL/min (ref >90)
Potassium: 4 mEq/L (ref 3.5–5.1)
Sodium: 138 mEq/L (ref 135–145)

## 2012-01-27 LAB — CBC
Platelets: 210 10*3/uL (ref 150–400)
RDW: 14.9 % (ref 11.5–15.5)
WBC: 7.8 10*3/uL (ref 4.0–10.5)

## 2012-01-27 LAB — D-DIMER, QUANTITATIVE: D-Dimer, Quant: <0.22 ug/mL-FEU (ref 0.00–0.48)

## 2012-01-27 LAB — TROPONIN I: Troponin I: <0.3 ng/mL (ref <0.30)

## 2012-01-27 MED ORDER — SODIUM CHLORIDE 0.9 % IV BOLUS (SEPSIS)
250.0000 mL | Freq: Once | INTRAVENOUS | Status: AC
  Administered 2012-01-27: 250 mL via INTRAVENOUS

## 2012-01-27 MED ORDER — SODIUM CHLORIDE 0.9 % IV SOLN: INTRAVENOUS | Status: DC

## 2012-01-27 MED ORDER — ASPIRIN 325 MG PO TABS
325.0000 mg | ORAL_TABLET | Freq: Once | ORAL | Status: AC
  Administered 2012-01-27: 325 mg via ORAL
  Filled 2012-01-27: qty 1

## 2012-01-27 NOTE — Discharge Instructions (Signed)
Aspirin and Your Heart Aspirin affects the way your blood clots and helps "thin" the blood. Aspirin has many uses in heart disease. It may be used as a primary prevention to help reduce the risk of heart related events. It also can be used as a secondary measure to prevent more heart attacks or to prevent additional damage from blood clots.  ASPIRIN MAY HELP IF YOU:  Have had a heart attack or chest pain.   Have undergone open heart surgery such as CABG (Coronary Artery Bypass Surgery).   Have had coronary angioplasty with or without stents.   Have experienced a stroke or TIA (transient ischemic attack).   Have peripheral vascular disease (PAD).   Have chronic heart rhythm problems such as atrial fibrillation.   Are at risk for heart disease.  BEFORE STARTING ASPIRIN Before you start taking aspirin, your caregiver will need to review your medical history. Many things will need to be taken into consideration, such as:  Smoking status.   Blood pressure.   Diabetes.   Gender.   Weight.   Cholesterol level.  ASPIRIN DOSES  Aspirin should only be taken on the advice of your caregiver. Talk to your caregiver about how much aspirin you should take. Aspirin comes in different doses such as:   81 mg.   162 mg.   325 mg.   The aspirin dose you take may be affected by many factors, some of which include:   Your current medications, especially if your are taking blood-thinners or anti-platelet medicine.   Liver function.   Heart disease risk.   Age.   Aspirin comes in two forms:   Non-enteric-coated. This type of aspirin does not have a coating and is absorbed faster. Non-enteric coated aspirin is recommended for patients experiencing chest pain symptoms. This type of aspirin also comes in a chewable form.   Enteric-coated. This means the aspirin has a special coating that releases the medicine very slowly. Enteric-coated aspirin causes less stomach upset. This type of  aspirin should not be chewed or crushed.  ASPIRIN SIDE EFFECTS Daily use of aspirin can increase your risk of serious side effects, some of these include:  Increased bleeding. This can range from a cut that does not stop bleeding to more serious problems such as stomach bleeding or bleeding into the brain (Intracerebral bleeding).   Increased bruising.   Stomach upset.   An allergic reaction such as red, itchy skin.   Increased risk of bleeding when combined with non-steroidal anti-inflammatory medicine (NSAIDS).   Alcohol should be drank in moderation when taking aspirin. Alcohol can increase the risk of stomach bleeding when taken with aspirin.   Aspirin should not be given to children less than 18 years of age due to the association of Reye syndrome. Reye syndrome is a serious illness that can affect the brain and liver. Studies have linked Reye syndrome with aspirin use in children.   People that have nasal polyps have an increased risk of developing an aspirin allergy.  SEEK MEDICAL CARE IF:   You develop an allergic reaction such as:   Hives.   Itchy skin.   Swelling of the lips, tongue or face.   You develop stomach pain.   You have unusual bleeding or bruising.   You have ringing in your ears.  SEEK IMMEDIATE MEDICAL CARE IF:   You have severe chest pain, especially if the pain is crushing or pressure-like and spreads to the arms, back, neck, or jaw. THIS   IS AN EMERGENCY. Do not wait to see if the pain will go away. Get medical help at once. Call your local emergency services (911 in the [place] DO NOT drive yourself to the hospital.   You have stroke-like symptoms such as:   Loss of vision.   Difficulty talking.   Numbness or weakness on one side of your body.   Numbness or weakness in your arm or leg.   Not thinking clearly or feeling confused.   Your bowel movements are bloody, dark red or black in color.   You vomit or cough up blood.   You have blood  in your urine.   You have shortness of breath, coughing or wheezing.  MAKE SURE YOU:   Understand these instructions.   Will monitor your condition.   Seek immediate medical care if necessary.  Document Released: 10/26/2008 Document Revised: 07/26/2011 Document Reviewed: 10/26/2008 Exit[employer] Patient Information 2012 [place], [place].  followup with your doctor next few days workup here tonight was negative for any signs of acute cardiac event or blood clotting your lungs. Recommend to start taking a baby aspirin a day. Return for new or worse symptoms.

## 2012-01-27 NOTE — ED Provider Notes (Addendum)
History     CSN: [id number]  Arrival date & time 01/26/12  2347   First MD Initiated Contact with Patient 01/27/12 0007      Chief Complaint  Patient presents with  . Chest Pain    (Consider location/radiation/quality/duration/timing/severity/associated sxs/prior treatment) Patient is a 41 y.o. male presenting with chest pain.  Chest Pain The chest pain began 6 - 12 hours ago. Chest pain occurs intermittently. The chest pain is resolved. At its most intense, the pain is at 8/10. The pain is currently at 0/10. The quality of the pain is described as sharp. The pain does not radiate. Pertinent negatives for primary symptoms include no fever, no fatigue, no syncope, no shortness of breath, no cough, no wheezing, no palpitations, no abdominal pain, no nausea, no vomiting and no dizziness. Risk factors include no known risk factors.     History reviewed. No pertinent past medical history.  Past Surgical History  Procedure Date  . I&d extremity 11/29/2011    Procedure: IRRIGATION AND DEBRIDEMENT EXTREMITY;  Surgeon: Sharma Covert;  Location: [facility] OR;  Service: Orthopedics;  Laterality: Left;  I&D Right Long and Index Fingers with Tendon Repair as Needed    History reviewed. No pertinent family history.  History  Substance Use Topics  . Smoking status: Never Smoker   . Smokeless tobacco: Not on file  . Alcohol Use: No      Review of Systems  Constitutional: Negative for fever and fatigue.  HENT: Negative for congestion and neck pain.   Eyes: Negative for pain and redness.  Respiratory: Negative for cough, shortness of breath and wheezing.   Cardiovascular: Positive for chest pain. Negative for palpitations and syncope.  Gastrointestinal: Negative for nausea, vomiting and abdominal pain.  Genitourinary: Negative for dysuria.  Musculoskeletal: Negative for back pain.  Neurological: Negative for dizziness and headaches.  Hematological: Does not bruise/bleed easily.    Allergies   Review of patient's allergies indicates no known allergies.  Home Medications   Current Outpatient Rx  Name Route Sig Dispense Refill  . OXYCODONE-ACETAMINOPHEN 7.5-500 MG PO TABS Oral Take 1 tablet by mouth every 4 (four) hours as needed.    . IBUPROFEN 200 MG PO TABS Oral Take 800 mg by mouth every 6 (six) hours as needed. pain     . VITAMIN C 500 MG PO TABS Oral Take 1 tablet (500 mg total) by mouth daily. 60 tablet 0    BP 150/89  Pulse 75  Temp(Src) 98.3 F (36.8 C) (Oral)  Resp 22  Ht 6\' 5"  (1.956 m)  Wt 340 lb (154.223 kg)  BMI 40.32 kg/m2  SpO2 96%  Physical Exam  Nursing note and vitals reviewed. Constitutional: He appears well-developed and well-nourished. No distress.  HENT:  Head: Normocephalic and atraumatic.  Mouth/Throat: Oropharynx is clear and moist.  Eyes: Conjunctivae and EOM are normal. Pupils are equal, round, and reactive to light.  Neck: Normal range of motion. Neck supple.  Cardiovascular: Normal rate, regular rhythm, normal heart sounds and intact distal pulses.   No murmur heard. Pulmonary/Chest: Effort normal and breath sounds normal. No respiratory distress. He has no wheezes. He has no rales. He exhibits no tenderness.  Abdominal: Soft. There is no tenderness.  Musculoskeletal: Normal range of motion. He exhibits no edema.  Lymphadenopathy:    He has no cervical adenopathy.  Neurological: He is alert. No cranial nerve deficit. He exhibits normal muscle tone. Coordination normal.  Skin: Skin is warm. No rash noted. He  is not diaphoretic.    ED Course  Procedures (including critical care time)  Labs Reviewed  BASIC METABOLIC PANEL - Abnormal; Notable for the following:    Glucose, Bld 120 (*)    All other components within normal limits  TROPONIN I  D-DIMER, QUANTITATIVE  CBC  TROPONIN I   Dg Chest 2 View  01/27/2012  *RADIOLOGY REPORT*  Clinical Data: Mid sternal chest pain for 1 week.  CHEST - 2 VIEW  Comparison: Chest radiograph  performed 11/29/2011  Findings: The lungs are well-aerated.  Minimal bibasilar opacities likely reflect atelectasis.  There is no evidence of pleural effusion or pneumothorax.  The heart is borderline enlarged; the mediastinal contour is within normal limits.  No acute osseous abnormalities are seen.  IMPRESSION: Minimal bibasilar opacities likely reflect atelectasis; lungs otherwise clear.  Borderline cardiomegaly.  Original Report Authenticated By: Tonia Ghent, M.D.   Results for orders placed during the hospital encounter of 01/26/12  TROPONIN I      Component Value Range   Troponin I <0.30  <0.30 (ng/mL)  D-DIMER, QUANTITATIVE      Component Value Range   D-Dimer, Quant <0.22  0.00 - 0.48 (ug/mL-FEU)  CBC      Component Value Range   WBC 7.8  4.0 - 10.5 (K/uL)   RBC 5.16  4.22 - 5.81 (MIL/uL)   Hemoglobin 13.9  13.0 - 17.0 (g/dL)   HCT 45.4  09.8 - 11.9 (%)   MCV 83.9  78.0 - 100.0 (fL)   MCH 26.9  26.0 - 34.0 (pg)   MCHC 32.1  30.0 - 36.0 (g/dL)   RDW 14.7  82.9 - 56.2 (%)   Platelets 210  150 - 400 (K/uL)  BASIC METABOLIC PANEL      Component Value Range   Sodium 138  135 - 145 (mEq/L)   Potassium 4.0  3.5 - 5.1 (mEq/L)   Chloride 102  96 - 112 (mEq/L)   CO2 22  19 - 32 (mEq/L)   Glucose, Bld 120 (*) 70 - 99 (mg/dL)   BUN 14  6 - 23 (mg/dL)   Creatinine, Ser 1.30  0.50 - 1.35 (mg/dL)   Calcium 9.7  8.4 - 86.5 (mg/dL)   GFR calc non Af Amer >90  >90 (mL/min)   GFR calc Af Amer >90  >90 (mL/min)  TROPONIN I      Component Value Range   Troponin I <0.30  <0.30 (ng/mL)    Date: 01/27/2012  Rate: 71  Rhythm: normal sinus rhythm  QRS Axis: normal  Intervals: normal  ST/T Wave abnormalities: normal  Conduction Disutrbances:none  Narrative Interpretation:   Old EKG Reviewed: unchanged No change in EKG from 11/29/2011   1. Chest pain       MDM  Chest pain onset at 7:00 this morning lasted for one hour then resolved and reoccurred at 8:30 this evening workup in the  emergency room and negative for acute cardiac event cardiac markers x2 negative EKG without acute event chest x-ray without ammonia or pneumothorax d-dimer negative suggesting not related to pulmonary embolism. We'll arrange followup with primary care Dr. Raelene Bott start taking baby aspirin a day.        Shelda Jakes, MD 01/27/12 [id number]  Shelda Jakes, MD 01/27/12 [phone]

## 2012-02-06 ENCOUNTER — Ambulatory Visit (HOSPITAL_COMMUNITY)
Admission: RE | Admit: 2012-02-06 | Discharge: 2012-02-06 | Disposition: A | Discharge status: Home / Self Care | Payer: BC Managed Care – PPO | Source: Ambulatory Visit | Attending: Family Medicine | Admitting: Family Medicine

## 2012-02-06 DIAGNOSIS — R079 Chest pain, unspecified: Secondary | ICD-10-CM | POA: Insufficient documentation

## 2012-02-06 NOTE — Progress Notes (Signed)
Stress Lab Nurses Notes - [facility]  JAMARQUES PINEDO 02/06/2012  Reason for doing test: Chest Pain Type of test: Regular GTX Nurse performing test: Parke Poisson, RN Nuclear Medicine Tech: Not Applicable Echo Tech: Not Applicable MD performing test: Lubertha South Family MD: Lubertha South Test explained and consent signed: yes IV started: No IV started Symptoms: SOB &  fatigue Treatment/Intervention: None Reason test stopped: fatigue and reached target HR After recovery IV was: NA Patient to return to Nuc. Med at : NA Patient discharged: Home Patient's Condition upon discharge was: stable Comments: During test BP 198/100 , 220/98 & HR 172.  Recovery BP 148/88 & HR 94.  Symptoms resolved in recovery. Erskine Speed T

## 2012-07-22 ENCOUNTER — Encounter (HOSPITAL_COMMUNITY): Payer: Self-pay | Admitting: Family Medicine

## 2012-08-30 ENCOUNTER — Emergency Department (HOSPITAL_COMMUNITY): Payer: BC Managed Care – PPO

## 2012-08-30 ENCOUNTER — Emergency Department (HOSPITAL_COMMUNITY)
Admission: EM | Admit: 2012-08-30 | Discharge: 2012-08-30 | Disposition: A | Discharge status: Home / Self Care | Payer: BC Managed Care – PPO | Attending: Emergency Medicine | Admitting: Emergency Medicine

## 2012-08-30 ENCOUNTER — Encounter (HOSPITAL_COMMUNITY): Payer: Self-pay | Admitting: *Deleted

## 2012-08-30 DIAGNOSIS — E669 Obesity, unspecified: Secondary | ICD-10-CM | POA: Insufficient documentation

## 2012-08-30 DIAGNOSIS — R079 Chest pain, unspecified: Secondary | ICD-10-CM | POA: Insufficient documentation

## 2012-08-30 DIAGNOSIS — Z7982 Long term (current) use of aspirin: Secondary | ICD-10-CM | POA: Insufficient documentation

## 2012-08-30 LAB — BASIC METABOLIC PANEL
CO2: 26 mEq/L (ref 19–32)
Chloride: 103 mEq/L (ref 96–112)
GFR calc non Af Amer: >90 mL/min (ref >90)
Glucose, Bld: 91 mg/dL (ref 70–99)
Potassium: 3.9 mEq/L (ref 3.5–5.1)
Sodium: 137 mEq/L (ref 135–145)

## 2012-08-30 LAB — CBC
Hemoglobin: 14.3 g/dL (ref 13.0–17.0)
MCHC: 33.6 g/dL (ref 30.0–36.0)
RBC: 5.11 MIL/uL (ref 4.22–5.81)
WBC: 9 10*3/uL (ref 4.0–10.5)

## 2012-08-30 MED ORDER — IBUPROFEN 400 MG PO TABS
600.0000 mg | ORAL_TABLET | Freq: Once | ORAL | Status: AC
  Administered 2012-08-30: 600 mg via ORAL
  Filled 2012-08-30: qty 2

## 2012-08-30 MED ORDER — IBUPROFEN 800 MG PO TABS
ORAL_TABLET | ORAL | Status: AC
  Filled 2012-08-30: qty 1

## 2012-08-30 NOTE — ED Notes (Signed)
Patient with no complaints at this time. Respirations even and unlabored. Skin warm/dry. Discharge instructions reviewed with patient at this time. Patient given opportunity to voice concerns/ask questions. Patient discharged at this time and left Emergency Department with steady gait.   

## 2012-08-30 NOTE — ED Notes (Signed)
Pain lt chest for 5 days, initially pain  Llq, that radiated up to chest and his md said "pulled muscle" ,Now pain in chest only and his md told him to come to ER

## 2012-08-30 NOTE — ED Provider Notes (Signed)
History     CSN: [id number]  Arrival date & time 08/30/12  1610   First MD Initiated Contact with Patient 08/30/12 1711      Chief Complaint  Patient presents with  . Chest Pain    (Consider location/radiation/quality/duration/timing/severity/associated sxs/prior treatment) Patient is a 41 y.o. male presenting with chest pain. The history is provided by the patient.  Chest Pain Pertinent negatives for primary symptoms include no fever, no shortness of breath, no cough, no palpitations and no abdominal pain.   pt c/o left cp at costal margin for past 4-5 days. Constant. Dull, mild. Non radiating. No neck,jaw, or arm pain. No associated nv, no diaphoresis, no sob. Pain is not pleuritic. At rest. No specific exacerbating or alleviating factors. No fam hx cad. Non smoker, no drug use. No leg pain or swelling. No dvt or pe hx. No immobility, trauma or surgery. No fever or chills. No cough or uri c/o. Normal appetite. No nvd. No gu c/o. No chest wall injury or strain. Reports normal stress test a approximately 6 months ago.     History reviewed. No pertinent past medical history.  Past Surgical History  Procedure Date  . I&d extremity 11/29/2011    Procedure: IRRIGATION AND DEBRIDEMENT EXTREMITY;  Surgeon: Sharma Covert;  Location: [facility] OR;  Service: Orthopedics;  Laterality: Left;  I&D Right Long and Index Fingers with Tendon Repair as Needed    History reviewed. No pertinent family history.  History  Substance Use Topics  . Smoking status: Never Smoker   . Smokeless tobacco: Not on file  . Alcohol Use: Yes     occ      Review of Systems  Constitutional: Negative for fever and chills.  HENT: Negative for neck pain.   Eyes: Negative for redness.  Respiratory: Negative for cough and shortness of breath.   Cardiovascular: Positive for chest pain. Negative for palpitations and leg swelling.  Gastrointestinal: Negative for abdominal pain.  Genitourinary: Negative for flank pain.    Musculoskeletal: Negative for back pain.  Skin: Negative for rash.  Neurological: Negative for headaches.  Hematological: Does not bruise/bleed easily.  Psychiatric/Behavioral: Negative for confusion.    Allergies  Review of patient's allergies indicates no known allergies.  Home Medications   Current Outpatient Rx  Name Route Sig Dispense Refill  . ASPIRIN EC 81 MG PO TBEC Oral Take 324 mg by mouth 2 (two) times daily as needed. For pain    . ETODOLAC 400 MG PO TABS Oral Take 400 mg by mouth 2 (two) times daily.    . IBUPROFEN 200 MG PO TABS Oral Take 800 mg by mouth every 6 (six) hours as needed. pain     . MOMETASONE FUROATE 50 MCG/ACT NA SUSP Nasal Place 2 sprays into the nose daily.      BP 149/87  Pulse 66  Temp 98.6 F (37 C) (Oral)  Resp 20  Ht 6\' 5"  (1.956 m)  Wt 350 lb (158.759 kg)  BMI 41.50 kg/m2  SpO2 98%  Physical Exam  Nursing note and vitals reviewed. Constitutional: He is oriented to person, place, and time. He appears well-developed and well-nourished. No distress.  HENT:  Head: Atraumatic.  Eyes: Pupils are equal, round, and reactive to light.  Neck: Neck supple. No tracheal deviation present.  Cardiovascular: Normal rate, regular rhythm, normal heart sounds and intact distal pulses.  Exam reveals no gallop and no friction rub.   No murmur heard. Pulmonary/Chest: Effort normal and breath sounds  normal. No accessory muscle usage. No respiratory distress. He exhibits no tenderness.  Abdominal: Soft. Bowel sounds are normal. He exhibits no distension and no mass. There is no tenderness. There is no rebound and no guarding.  Genitourinary:       No cva tenderness  Musculoskeletal: Normal range of motion. He exhibits no edema and no tenderness.  Neurological: He is alert and oriented to person, place, and time.  Skin: Skin is warm and dry.  Psychiatric: He has a normal mood and affect.    ED Course  Procedures (including critical care time)   Labs  Reviewed  CBC  BASIC METABOLIC PANEL  TROPONIN I    Results for orders placed during the hospital encounter of 08/30/12  CBC      Component Value Range   WBC 9.0  4.0 - 10.5 K/uL   RBC 5.11  4.22 - 5.81 MIL/uL   Hemoglobin 14.3  13.0 - 17.0 g/dL   HCT 16.1  09.6 - 04.5 %   MCV 83.4  78.0 - 100.0 fL   MCH 28.0  26.0 - 34.0 pg   MCHC 33.6  30.0 - 36.0 g/dL   RDW 40.9  81.1 - 91.4 %   Platelets 202  150 - 400 K/uL  BASIC METABOLIC PANEL      Component Value Range   Sodium 137  135 - 145 mEq/L   Potassium 3.9  3.5 - 5.1 mEq/L   Chloride 103  96 - 112 mEq/L   CO2 26  19 - 32 mEq/L   Glucose, Bld 91  70 - 99 mg/dL   BUN 17  6 - 23 mg/dL   Creatinine, Ser 7.82  0.50 - 1.35 mg/dL   Calcium 9.3  8.4 - 95.6 mg/dL   GFR calc non Af Amer >90  >90 mL/min   GFR calc Af Amer >90  >90 mL/min  TROPONIN I      Component Value Range   Troponin I <0.30  <0.30 ng/mL   Dg Chest 2 View  08/30/2012  *RADIOLOGY REPORT*  Clinical Data: Chest pain since Sunday.  Obesity.  CHEST - 2 VIEW  Comparison: 01/27/2012  Findings: The heart is enlarged.  There are no focal consolidations or pleural effusions.  No pulmonary edema.  Degenerative changes are seen in the thoracic spine.  IMPRESSION: Cardiomegaly.   Original Report Authenticated By: Patterson Hammersmith, M.D.       MDM  Labs. Cxr. Ecg.   Date: 08/30/2012  Rate: 63  Rhythm: normal sinus rhythm  QRS Axis: normal  Intervals: normal  ST/T Wave abnormalities: normal  Conduction Disutrbances:incomplete rbbb  Narrative Interpretation:   Old EKG Reviewed: unchanged   Reviewed nursing notes and prior charts for additional history.   Two prior/remote ED visits for cp reviewed.  After atypical cp for past 4-5 days, constant, trop negative. ecg without acute change. cxr neg acute.   Discussed pcp/card f/u.   Pt appears stable for d/c.          Suzi Roots, MD 08/30/12 [phone]

## 2012-09-03 ENCOUNTER — Ambulatory Visit (INDEPENDENT_AMBULATORY_CARE_PROVIDER_SITE_OTHER): Payer: BC Managed Care – PPO | Admitting: Physician Assistant

## 2012-09-03 ENCOUNTER — Encounter: Payer: Self-pay | Admitting: Physician Assistant

## 2012-09-03 VITALS — BP 151/97 | HR 61 | Ht 77.0 in | Wt 367.0 lb

## 2012-09-03 DIAGNOSIS — R079 Chest pain, unspecified: Secondary | ICD-10-CM

## 2012-09-03 DIAGNOSIS — I10 Essential (primary) hypertension: Secondary | ICD-10-CM

## 2012-09-03 NOTE — Progress Notes (Signed)
Primary Cardiologist: Jerral Bonito, MD (new)   HPI: Patient presents to our office for the first time, status post recent [facility] ER visit, on October 4, with complaint of chest pain.  Patient presented with no known history of heart disease, and his symptoms were felt to be atypical. A singular troponin level was drawn and normal. Chest x-ray yielded cardiomegaly, but no edema or pleural effusions.  Clinically, he denies any history of exertional CP region dyspnea. His symptoms began approximately 1/2 weeks ago, and he now believes that it may have developed after he had moved some furniture. It originated in the lower left thorax, and then radiated upward. He was initially placed on NSAIDs, by Dr. Gerda Diss, but his symptoms persisted and he was directed to the ED this past Friday.  Since then, he reports that his CP has completely resolved. He continues to have some residual left thoracic pain, but this has also significantly improved. He denies any exacerbation with deep inspiration or walking.  Of note, patient states that he had a routine GXT back in March of this year, per Dr. Gerda Diss, for evaluation of CP. This was reportedly a negative study.   12-lead EKG indicated NSR 63 bpm; LAD; incompletely RBBB; nonspecific ST changes  No Known Allergies  Current Outpatient Prescriptions  Medication Sig Dispense Refill  . etodolac (LODINE) 400 MG tablet Take 400 mg by mouth 2 (two) times daily.      Marland Kitchen ibuprofen (ADVIL,MOTRIN) 200 MG tablet Take 800 mg by mouth every 6 (six) hours as needed. pain         Past Medical History  Diagnosis Date  . Chest pain   . HTN (hypertension)     Past Surgical History  Procedure Date  . I&d extremity 11/29/2011    Procedure: IRRIGATION AND DEBRIDEMENT EXTREMITY;  Surgeon: Sharma Covert;  Location: [facility] OR;  Service: Orthopedics;  Laterality: Left;  I&D Right Long and Index Fingers with Tendon Repair as Needed    History   Social History  . Marital Status:  Married    Spouse Name: N/A    Number of Children: N/A  . Years of Education: N/A   Occupational History  . Not on file.   Social History Main Topics  . Smoking status: Never Smoker   . Smokeless tobacco: Not on file  . Alcohol Use: Yes     occ  . Drug Use: No  . Sexually Active: Not on file   Other Topics Concern  . Not on file   Social History Narrative  . No narrative on file    No family history on file.  ROS: no nausea, vomiting; no fever, chills; no melena, hematochezia; no claudication  PHYSICAL EXAM: BP 151/97  Pulse 61  Ht 6\' 5"  (1.956 m)  Wt 367 lb (166.47 kg)  BMI 43.52 kg/m2 GENERAL: 41 year-old male, morbidly obese; NAD HEENT: NCAT, PERRLA, EOMI; sclera clear; no xanthelasma NECK: palpable bilateral carotid pulses, no bruits; unable to assess JVD, secondary to neck girth LUNGS: CTA bilaterally CARDIAC: RRR (S1, S2); no significant murmurs; no rubs or gallops ABDOMEN: Markedly protuberant EXTREMETIES: 1+ bilateral nonpitting peripheral edema SKIN: warm/dry; no obvious rash/lesions MUSCULOSKELETAL: no joint deformity NEURO: no focal deficit; NL affect   EKG: reviewed and available in Electronic Records   ASSESSMENT & PLAN:  Chest pain Patient presents with atypical symptoms which most likely are related to musculoskeletal etiology. He has minimal CRFs, although he does present with elevated BP, which may  be new. His symptoms of CP have since resolved, and recent labs notable for a negative troponin and a non-diagnostic EKG. Patient presents with no history of exertional CP, and reportedly had a negative routine GXT back in March of this year. After conferring with Dr. Myrtis Ser, plan is to proceed with a baseline 2-D echocardiogram for assessment of LVF, and rule out of any underlying structural abnormalities. We will have patient return for early clinic followup for review of this study, and for further recommendations. If he were to have recurrent or  worsening symptoms, then we may consider further evaluation with a dobutamine stress echocardiogram for risk stratification.  HTN (hypertension) I instructed patient to closely monitor his BP, and to follow up with his primary M.D. regarding possible medical therapy, if his BP remains elevated.    James Rivas, PAC

## 2012-09-03 NOTE — Assessment & Plan Note (Signed)
I instructed patient to closely monitor his BP, and to follow up with his primary M.D. regarding possible medical therapy, if his BP remains elevated.

## 2012-09-03 NOTE — Assessment & Plan Note (Signed)
Patient presents with atypical symptoms which most likely are related to musculoskeletal etiology. He has minimal CRFs, although he does present with elevated BP, which may be new. His symptoms of CP have since resolved, and recent labs notable for a negative troponin and a non-diagnostic EKG. Patient presents with no history of exertional CP, and reportedly had a negative routine GXT back in March of this year. After conferring with Dr. Myrtis Ser, plan is to proceed with a baseline 2-D echocardiogram for assessment of LVF, and rule out of any underlying structural abnormalities. We will have patient return for early clinic followup for review of this study, and for further recommendations. If he were to have recurrent or worsening symptoms, then we may consider further evaluation with a dobutamine stress echocardiogram for risk stratification.

## 2012-09-03 NOTE — Patient Instructions (Addendum)
   Echo  Office will contact with results Follow up in  2 weeks 

## 2012-09-05 ENCOUNTER — Other Ambulatory Visit: Payer: Self-pay

## 2012-09-05 ENCOUNTER — Other Ambulatory Visit (INDEPENDENT_AMBULATORY_CARE_PROVIDER_SITE_OTHER): Payer: BC Managed Care – PPO

## 2012-09-05 DIAGNOSIS — R072 Precordial pain: Secondary | ICD-10-CM

## 2012-09-05 DIAGNOSIS — R079 Chest pain, unspecified: Secondary | ICD-10-CM

## 2012-09-09 ENCOUNTER — Encounter: Payer: Self-pay | Admitting: *Deleted

## 2012-09-20 ENCOUNTER — Ambulatory Visit (INDEPENDENT_AMBULATORY_CARE_PROVIDER_SITE_OTHER): Payer: BC Managed Care – PPO | Admitting: Physician Assistant

## 2012-09-20 ENCOUNTER — Encounter: Payer: Self-pay | Admitting: Physician Assistant

## 2012-09-20 VITALS — BP 164/83 | HR 68 | Ht 77.0 in | Wt 364.8 lb

## 2012-09-20 DIAGNOSIS — R079 Chest pain, unspecified: Secondary | ICD-10-CM

## 2012-09-20 DIAGNOSIS — I10 Essential (primary) hypertension: Secondary | ICD-10-CM

## 2012-09-20 NOTE — Patient Instructions (Signed)
Continue all current medications. Follow up with primary MD - Dr. Lubertha South in 7-10 days for hypertension Follow up as needed

## 2012-09-20 NOTE — Assessment & Plan Note (Addendum)
I strongly recommended that he return to Dr. Gerda Diss for early followup, and we will assist him with an appointment today. If he demonstrates persistently elevated BP readings, then he may require medical therapy, in addition to lifestyle modification changes. In any event, I will defer to Dr. Gerda Diss regarding continued monitoring and recommended management for this.

## 2012-09-20 NOTE — Progress Notes (Signed)
Primary Cardiologist: James Bonito, MD   HPI: Patient presents for early scheduled followup, and review of outpatient 2-D echocardiogram.  Since his last OV, patient continues to experience occasional, intermittent left chest discomfort. However, as previously outlined, he feels that it originates in the "stomach", and then radiates upward. I queried him, once again, about any exertional symptoms, and he continues to report no interim development of such.  I reviewed with him the results of recent echocardiogram, with results as follows:   - EF 55-60%, grade 1 diastolic dysfunction; no focal WMAs; no significant valvular disease, and no pericardial effusion  No Known Allergies  Current Outpatient Prescriptions  Medication Sig Dispense Refill  . ibuprofen (ADVIL,MOTRIN) 200 MG tablet Take 800 mg by mouth every 6 (six) hours as needed. pain         Past Medical History  Diagnosis Date  . Chest pain   . HTN (hypertension)     Past Surgical History  Procedure Date  . I&d extremity 11/29/2011    Procedure: IRRIGATION AND DEBRIDEMENT EXTREMITY;  Surgeon: Sharma Covert;  Location: [facility] OR;  Service: Orthopedics;  Laterality: Left;  I&D Right Long and Index Fingers with Tendon Repair as Needed    History   Social History  . Marital Status: Married    Spouse Name: N/A    Number of Children: N/A  . Years of Education: N/A   Occupational History  . Not on file.   Social History Main Topics  . Smoking status: Never Smoker   . Smokeless tobacco: Not on file  . Alcohol Use: Yes     occ  . Drug Use: No  . Sexually Active: Not on file   Other Topics Concern  . Not on file   Social History Narrative  . No narrative on file    No family history on file.  ROS: no nausea, vomiting; no fever, chills; no melena, hematochezia; no claudication  PHYSICAL EXAM: BP 164/83  Pulse 68  Ht 6\' 5"  (1.956 m)  Wt 364 lb 12.8 oz (165.472 kg)  BMI 43.26 kg/m2 GENERAL: 41 year-old male,  morbidly obese; NAD  HEENT: NCAT, PERRLA, EOMI; sclera clear; no xanthelasma  NECK: palpable bilateral carotid pulses, no bruits; unable to assess JVD, secondary to neck girth  LUNGS: CTA bilaterally  CARDIAC: RRR (S1, S2); no significant murmurs; no rubs or gallops  ABDOMEN: Markedly protuberant  EXTREMETIES: 1+ bilateral nonpitting peripheral edema  SKIN: warm/dry; no obvious rash/lesions  MUSCULOSKELETAL: no joint deformity  NEURO: no focal deficit; NL affect    EKG: reviewed and available in Electronic Records   ASSESSMENT & PLAN:  Chest pain No further workup indicated. Recent 2-D echocardiogram was within normal limits. Review of patient's history of CP is without any correlation with exertion. Therefore, I feel that there is no current indication to proceed with a stress test, noting that patient had a reportedly negative GXT study back in March of this year, with Dr. Gerda Diss. We will have him return to Dr. Myrtis Ser on an as needed basis.  HTN (hypertension) I strongly recommended that he return to Dr. Gerda Diss for early followup, and we will assist him with an appointment today. If he demonstrates persistently elevated BP readings, then he may require medical therapy, in addition to lifestyle modification changes. In any event, I will defer to Dr. Gerda Diss regarding continued monitoring and recommended management for this.    Gene Beula Joyner, PAC

## 2012-09-20 NOTE — Assessment & Plan Note (Signed)
No further workup indicated. Recent 2-D echocardiogram was within normal limits. Review of patient's history of CP is without any correlation with exertion. Therefore, I feel that there is no current indication to proceed with a stress test, noting that patient had a reportedly negative GXT study back in March of this year, with Dr. Gerda Diss. We will have him return to Dr. Myrtis Ser on an as needed basis.

## 2012-12-01 ENCOUNTER — Encounter (HOSPITAL_COMMUNITY): Payer: Self-pay | Admitting: *Deleted

## 2012-12-01 ENCOUNTER — Emergency Department (HOSPITAL_COMMUNITY)
Admission: EM | Admit: 2012-12-01 | Discharge: 2012-12-02 | Disposition: A | Discharge status: Home / Self Care | Payer: BC Managed Care – PPO | Attending: Emergency Medicine | Admitting: Emergency Medicine

## 2012-12-01 ENCOUNTER — Emergency Department (HOSPITAL_COMMUNITY): Payer: BC Managed Care – PPO

## 2012-12-01 DIAGNOSIS — M79602 Pain in left arm: Secondary | ICD-10-CM

## 2012-12-01 DIAGNOSIS — R112 Nausea with vomiting, unspecified: Secondary | ICD-10-CM | POA: Insufficient documentation

## 2012-12-01 DIAGNOSIS — R42 Dizziness and giddiness: Secondary | ICD-10-CM

## 2012-12-01 DIAGNOSIS — R6883 Chills (without fever): Secondary | ICD-10-CM | POA: Insufficient documentation

## 2012-12-01 DIAGNOSIS — M79609 Pain in unspecified limb: Secondary | ICD-10-CM | POA: Insufficient documentation

## 2012-12-01 DIAGNOSIS — I10 Essential (primary) hypertension: Secondary | ICD-10-CM

## 2012-12-01 DIAGNOSIS — R079 Chest pain, unspecified: Secondary | ICD-10-CM

## 2012-12-01 DIAGNOSIS — E669 Obesity, unspecified: Secondary | ICD-10-CM | POA: Insufficient documentation

## 2012-12-01 DIAGNOSIS — R0789 Other chest pain: Secondary | ICD-10-CM | POA: Insufficient documentation

## 2012-12-01 LAB — CBC WITH DIFFERENTIAL/PLATELET
Basophils Absolute: 0 10*3/uL (ref 0.0–0.1)
Basophils Relative: 0 % (ref 0–1)
Eosinophils Absolute: 0.1 10*3/uL (ref 0.0–0.7)
Hemoglobin: 14 g/dL (ref 13.0–17.0)
MCH: 27.9 pg (ref 26.0–34.0)
MCHC: 33.2 g/dL (ref 30.0–36.0)
Monocytes Relative: 7 % (ref 3–12)
Neutro Abs: 5.9 10*3/uL (ref 1.7–7.7)
Neutrophils Relative %: 61 % (ref 43–77)
RDW: 14.5 % (ref 11.5–15.5)

## 2012-12-01 LAB — COMPREHENSIVE METABOLIC PANEL
AST: 17 U/L (ref 0–37)
Albumin: 3.9 g/dL (ref 3.5–5.2)
Alkaline Phosphatase: 64 U/L (ref 39–117)
BUN: 17 mg/dL (ref 6–23)
Chloride: 105 mEq/L (ref 96–112)
Creatinine, Ser: 0.95 mg/dL (ref 0.50–1.35)
Potassium: 3.5 mEq/L (ref 3.5–5.1)
Total Protein: 7.6 g/dL (ref 6.0–8.3)

## 2012-12-01 LAB — TROPONIN I: Troponin I: <0.3 ng/mL (ref <0.30)

## 2012-12-01 MED ORDER — KETOROLAC TROMETHAMINE 30 MG/ML IJ SOLN
30.0000 mg | Freq: Once | INTRAMUSCULAR | Status: AC
  Administered 2012-12-01: 30 mg via INTRAVENOUS
  Filled 2012-12-01: qty 1

## 2012-12-01 MED ORDER — CYCLOBENZAPRINE HCL 10 MG PO TABS
10.0000 mg | ORAL_TABLET | Freq: Once | ORAL | Status: AC
  Administered 2012-12-01: 10 mg via ORAL
  Filled 2012-12-01: qty 1

## 2012-12-01 MED ORDER — MECLIZINE HCL 12.5 MG PO TABS
25.0000 mg | ORAL_TABLET | Freq: Once | ORAL | Status: AC
  Administered 2012-12-02: 25 mg via ORAL
  Filled 2012-12-01: qty 2

## 2012-12-01 MED ORDER — NITROGLYCERIN 2 % TD OINT
1.0000 [in_us] | TOPICAL_OINTMENT | Freq: Once | TRANSDERMAL | Status: AC
Start: 2012-12-01 — End: 2012-12-01
  Administered 2012-12-01: 1 [in_us] via TOPICAL
  Filled 2012-12-01: qty 1

## 2012-12-01 MED ORDER — ONDANSETRON HCL 4 MG/2ML IJ SOLN
4.0000 mg | Freq: Once | INTRAMUSCULAR | Status: AC
  Administered 2012-12-01: 4 mg via INTRAVENOUS
  Filled 2012-12-01: qty 2

## 2012-12-01 NOTE — ED Notes (Signed)
Pt c/o left sided chest pain and nausea that started around 9pm.

## 2012-12-01 NOTE — ED Provider Notes (Signed)
History   This chart was scribed for Ward Givens, MD scribed by Magnus Sinning. The patient was seen in room [facility] at 22:33   CSN: [id number]  Arrival date & time 12/01/12  2213    Chief Complaint  Patient presents with  . Chest Pain  . Nausea    (Consider location/radiation/quality/duration/timing/severity/associated sxs/prior treatment) Patient is a 42 y.o. male presenting with chest pain. The history is provided by the patient. No language interpreter was used.  Chest Pain Primary symptoms include nausea, vomiting and dizziness. Pertinent negatives for primary symptoms include no fever and no shortness of breath.  Dizziness also occurs with nausea and vomiting. Dizziness does not occur with diaphoresis.   Pertinent negatives for associated symptoms include no diaphoresis.    James Rivas is a 42 y.o. male who presents to the Emergency Department complaining of gradually worsened constant severe sharp stabbing left-sided CP with associated dizziness, chills, 4 episodes of emesis and nausea, all onset 1.5 hours ago while watching TV. Patient also reports left arm pain that has been intermittent since two days ago  Pt states his CP was initially intermittent starting four days ago, but he says it has since worsened and become more persistent.He describes that upon standing up from the bed he had spinning like dizziness, but he denies SOB,  blurred or double vision, fevers or diaphoresis.  Pt says at eval at [facility] cardiology office he was informed that he had "bruised arteries" on echo and that they noted he also had pulled muscle.  Pt explains that hx of CP episodes began approximately three months ago, which he notes was improving prior to recent sxs. He explains that he has been having recent stressors, which might be contributing to CP.  The patient explains he is currently on HTN medication, which he says he has been taking as prescribed.   PCP: Dr. Lubertha South Cardiologist: Dr Stormy Card in [place]  Past Medical History  Diagnosis Date  . Chest pain   . HTN (hypertension)     Past Surgical History  Procedure Date  . I&d extremity 11/29/2011    Procedure: IRRIGATION AND DEBRIDEMENT EXTREMITY;  Surgeon: Sharma Covert;  Location: [facility] OR;  Service: Orthopedics;  Laterality: Left;  I&D Right Long and Index Fingers with Tendon Repair as Needed    History reviewed. No pertinent family history. Provides no family history of heart disease.   History  Substance Use Topics  . Smoking status: Never Smoker   . Smokeless tobacco: Not on file  . Alcohol Use: Yes     Comment: occ  The patient works in [employer] and he states he does not smoke or drink.  Review of Systems  Constitutional: Positive for chills. Negative for fever and diaphoresis.  Eyes: Negative for visual disturbance.  Respiratory: Negative for shortness of breath.   Cardiovascular: Positive for chest pain.  Gastrointestinal: Positive for nausea and vomiting.  Neurological: Positive for dizziness.  All other systems reviewed and are negative.    Allergies  Review of patient's allergies indicates no known allergies.  Home Medications   Current Outpatient Rx  Name  Route  Sig  Dispense  Refill  . CYCLOBENZAPRINE HCL 10 MG PO TABS   Oral   Take 1 tablet (10 mg total) by mouth 3 (three) times daily as needed for muscle spasms.   30 tablet   0   . MECLIZINE HCL 25 MG PO TABS   Oral   Take  1 tablet (25 mg total) by mouth 4 (four) times daily as needed for dizziness.   30 tablet   0     BP 162/85  Pulse 73  Temp 98.1 F (36.7 C) (Oral)  Resp 20  Ht 6\' 5"  (1.956 m)  Wt 334 lb (151.501 kg)  BMI 39.61 kg/m2  SpO2 98%  Physical Exam  Nursing note and vitals reviewed. Constitutional: He is oriented to person, place, and time. He appears well-developed and well-nourished. No distress.       obese  HENT:  Head: Normocephalic and atraumatic.  Right  Ear: External ear normal.  Left Ear: External ear normal.  Nose: Nose normal.  Mouth/Throat: Oropharynx is clear and moist.  Eyes: Conjunctivae normal and EOM are normal. Pupils are equal, round, and reactive to light.  Neck: Normal range of motion. Neck supple. No tracheal deviation present.  Cardiovascular: Normal rate, regular rhythm and normal heart sounds.   Pulmonary/Chest: Effort normal and breath sounds normal. No respiratory distress. He has no wheezes. He has no rales.  Abdominal: Soft. Bowel sounds are normal. He exhibits no distension. There is no tenderness. There is no rebound and no guarding.  Musculoskeletal: Normal range of motion.  Neurological: He is alert and oriented to person, place, and time. No sensory deficit.  Skin: Skin is dry.  Psychiatric: He has a normal mood and affect. His behavior is normal.    ED Course  Procedures (including critical care time) DIAGNOSTIC STUDIES: Oxygen Saturation is 98% on room air, normal by my interpretation.     Medications  cyclobenzaprine (FLEXERIL) 10 MG tablet (not administered)  meclizine (ANTIVERT) 25 MG tablet (not administered)  ondansetron (ZOFRAN) injection 4 mg (4 mg Intravenous Given 12/01/12 2247)  nitroGLYCERIN (NITROGLYN) 2 % ointment 1 inch (1 inch Topical Given 12/01/12 2248)  ketorolac (TORADOL) 30 MG/ML injection 30 mg (30 mg Intravenous Given 12/01/12 2248)  cyclobenzaprine (FLEXERIL) tablet 10 mg (10 mg Oral Given 12/01/12 2248)  meclizine (ANTIVERT) tablet 25 mg (25 mg Oral Given 12/02/12 0010)   COORDINATION OF CARE: 22:38: Physical exam performed  23:59 Pt is feeling better. BP 131/62 now. Will discharge after second troponin if negative.     Pt was seen by [facility] in October who felt he was having atypical chest pain. Pt does admit to some stress in his life right now. He has no family hx of CAD and his main risk factor for CAD is HTN.   Echocardiogram Oct  2013 ------------------------------------------------------------ LV EF: 55% - 60%  ------------------------------------------------------------ Indications: Chest pain 786.51.  ------------------------------------------------------------ History: PMH: Acquired from the patient and from the patient's chart. Bilateral lower extremity edema. No prior cardiac history. Risk factors: Hypertension.  ------------------------------------------------------------ Study Conclusions  - Left ventricle: The cavity size was normal. Wall thickness was increased in a pattern of mild LVH. Systolic function was normal. The estimated ejection fraction was in the range of 55% to 60%. Wall motion was normal; there were no regional wall motion abnormalities. Doppler parameters are consistent with abnormal left ventricular relaxation (grade 1 diastolic dysfunction). - Mitral valve: Trivial regurgitation. - Left atrium: The atrium was at the upper limits of normal in size. - Right ventricle: The cavity size was mildly dilated. - Right atrium: The atrium was mildly dilated. - Tricuspid valve: Trivial regurgitation. - Pericardium, extracardiac: There was no pericardial effusion. Transthoracic echocardiography. M-mode, complete 2D, spectral Doppler, and color Doppler. Height: Height: 195.6cm. Height: 77in. Weight: Weight: 166.5kg. Weight: 366.2lb. Body mass index:  BMI: 43.5kg/m^2. Body surface area: BSA: 2.72m^2. Blood pressure: 150/80. Patient status: Outpatient. Location: [facility].  ------------------------------------------------------------  ---------------------------------------------------------------- Prepared and Electronically Authenticated by  Nona Dell 2013-10-10T18:26:[id number]    Results for orders placed during the hospital encounter of 12/01/12  TROPONIN I      Component Value Range   Troponin I <0.30  <0.30 ng/mL  CBC WITH DIFFERENTIAL      Component Value Range    WBC 9.7  4.0 - 10.5 K/uL   RBC 5.02  4.22 - 5.81 MIL/uL   Hemoglobin 14.0  13.0 - 17.0 g/dL   HCT 16.1  09.6 - 04.5 %   MCV 84.1  78.0 - 100.0 fL   MCH 27.9  26.0 - 34.0 pg   MCHC 33.2  30.0 - 36.0 g/dL   RDW 40.9  81.1 - 91.4 %   Platelets 213  150 - 400 K/uL   Neutrophils Relative 61  43 - 77 %   Neutro Abs 5.9  1.7 - 7.7 K/uL   Lymphocytes Relative 31  12 - 46 %   Lymphs Abs 3.0  0.7 - 4.0 K/uL   Monocytes Relative 7  3 - 12 %   Monocytes Absolute 0.6  0.1 - 1.0 K/uL   Eosinophils Relative 1  0 - 5 %   Eosinophils Absolute 0.1  0.0 - 0.7 K/uL   Basophils Relative 0  0 - 1 %   Basophils Absolute 0.0  0.0 - 0.1 K/uL  COMPREHENSIVE METABOLIC PANEL      Component Value Range   Sodium 137  135 - 145 mEq/L   Potassium 3.5  3.5 - 5.1 mEq/L   Chloride 105  96 - 112 mEq/L   CO2 24  19 - 32 mEq/L   Glucose, Bld 118 (*) 70 - 99 mg/dL   BUN 17  6 - 23 mg/dL   Creatinine, Ser 7.82  0.50 - 1.35 mg/dL   Calcium 9.2  8.4 - 95.6 mg/dL   Total Protein 7.6  6.0 - 8.3 g/dL   Albumin 3.9  3.5 - 5.2 g/dL   AST 17  0 - 37 U/L   ALT 15  0 - 53 U/L   Alkaline Phosphatase 64  39 - 117 U/L   Total Bilirubin 0.3  0.3 - 1.2 mg/dL   GFR calc non Af Amer >90  >90 mL/min   GFR calc Af Amer >90  >90 mL/min  TROPONIN I      Component Value Range   Troponin I <0.30  <0.30 ng/mL   Dg Chest Portable 1 View  12/01/2012  *RADIOLOGY REPORT*  Clinical Data: Chest pain and nausea.  PORTABLE CHEST - 1 VIEW  Comparison: PA and lateral chest 08/30/2012 and 03/25/2011.  Findings: There is cardiomegaly without edema.  Lungs clear.  No pneumothorax or effusion.  IMPRESSION: Cardiomegaly without acute disease.   Original Report Authenticated By: Holley Dexter, M.D.     Date: 12/01/2012  Rate: 63  Rhythm: normal sinus rhythm  QRS Axis: normal  Intervals: normal  ST/T Wave abnormalities: normal  Conduction Disutrbances:normal  Narrative Interpretation: PRWP  Old EKG Reviewed: none available     1.  Chest pain at rest   2. Hypertension   3. Dizziness   4. Left arm pain     New Prescriptions   CYCLOBENZAPRINE (FLEXERIL) 10 MG TABLET    Take 1 tablet (10 mg total) by mouth 3 (three) times daily as needed for muscle spasms.  MECLIZINE (ANTIVERT) 25 MG TABLET    Take 1 tablet (25 mg total) by mouth 4 (four) times daily as needed for dizziness.    Plan discharge  Devoria Albe, MD, FACEP   MDM   I personally performed the services described in this documentation, which was scribed in my presence. The recorded information has been reviewed and considered.  Devoria Albe, MD, FACEP         Ward Givens, MD 12/02/12 [phone]

## 2012-12-02 LAB — TROPONIN I: Troponin I: <0.3 ng/mL (ref <0.30)

## 2012-12-02 MED ORDER — MECLIZINE HCL 25 MG PO TABS: 25.0000 mg | ORAL_TABLET | Freq: Four times a day (QID) | ORAL | Status: DC | PRN

## 2012-12-02 MED ORDER — CYCLOBENZAPRINE HCL 10 MG PO TABS: 10.0000 mg | ORAL_TABLET | Freq: Three times a day (TID) | ORAL | Status: DC | PRN

## 2013-01-05 ENCOUNTER — Emergency Department (HOSPITAL_COMMUNITY)
Admission: EM | Admit: 2013-01-05 | Discharge: 2013-01-05 | Disposition: A | Discharge status: Home / Self Care | Payer: BC Managed Care – PPO | Attending: Emergency Medicine | Admitting: Emergency Medicine

## 2013-01-05 ENCOUNTER — Encounter (HOSPITAL_COMMUNITY): Payer: Self-pay

## 2013-01-05 DIAGNOSIS — I10 Essential (primary) hypertension: Secondary | ICD-10-CM | POA: Insufficient documentation

## 2013-01-05 DIAGNOSIS — R079 Chest pain, unspecified: Secondary | ICD-10-CM | POA: Insufficient documentation

## 2013-01-05 DIAGNOSIS — Z79899 Other long term (current) drug therapy: Secondary | ICD-10-CM | POA: Insufficient documentation

## 2013-01-05 DIAGNOSIS — I8390 Asymptomatic varicose veins of unspecified lower extremity: Secondary | ICD-10-CM

## 2013-01-05 DIAGNOSIS — R0789 Other chest pain: Secondary | ICD-10-CM

## 2013-01-05 DIAGNOSIS — R0602 Shortness of breath: Secondary | ICD-10-CM | POA: Insufficient documentation

## 2013-01-05 DIAGNOSIS — R110 Nausea: Secondary | ICD-10-CM | POA: Insufficient documentation

## 2013-01-05 DIAGNOSIS — R071 Chest pain on breathing: Secondary | ICD-10-CM | POA: Insufficient documentation

## 2013-01-05 DIAGNOSIS — I83893 Varicose veins of bilateral lower extremities with other complications: Secondary | ICD-10-CM | POA: Insufficient documentation

## 2013-01-05 LAB — POCT I-STAT TROPONIN I
Troponin i, poc: 0.01 ng/mL (ref 0.00–0.08)
Troponin i, poc: 0.01 ng/mL (ref 0.00–0.08)

## 2013-01-05 LAB — POCT I-STAT, CHEM 8
BUN: 13 mg/dL (ref 6–23)
Chloride: 107 mEq/L (ref 96–112)
Creatinine, Ser: 1 mg/dL (ref 0.50–1.35)
Sodium: 141 mEq/L (ref 135–145)

## 2013-01-05 MED ORDER — KETOROLAC TROMETHAMINE 30 MG/ML IJ SOLN
30.0000 mg | Freq: Once | INTRAMUSCULAR | Status: AC
  Administered 2013-01-05: 30 mg via INTRAVENOUS
  Filled 2013-01-05: qty 1

## 2013-01-05 NOTE — ED Notes (Signed)
Pt seen by EDP, pt reports chest pain started yesterday, had N/V, SOB and diaphoresis.  Pt was reminded what was told to RN a few minutes earlier,  NAD noted at this time.

## 2013-01-05 NOTE — ED Notes (Signed)
Pt was laying on sofa when started around 1pm today, pain is mid sternal, raadiated to left arm, burning in sensation, +sob, +nausea and vomited x2.  Pain is constant.since it began. Has taken no meds.

## 2013-01-05 NOTE — ED Provider Notes (Signed)
History     CSN: [id number]  Arrival date & time 01/05/13  1018   First MD Initiated Contact with Patient 01/05/13 1031      Chief Complaint  Patient presents with  . Extremity Laceration    (Consider location/radiation/quality/duration/timing/severity/associated sxs/prior treatment) HPI Comments: Patient states he was in his usual state of good health until an hour before admission to the emergency department at which time he was in the shower and and began to notice some blood in the shower. He got out dried off his leg and notice that he had an area of bleeding of the right lower leg. Patient denies having any pain. He denies any known injury. Not on any blood thinning type medications. And he has never had this problem to occur before.  The history is provided by the patient.    Past Medical History  Diagnosis Date  . Chest pain   . HTN (hypertension)     Past Surgical History  Procedure Laterality Date  . I&d extremity  11/29/2011    Procedure: IRRIGATION AND DEBRIDEMENT EXTREMITY;  Surgeon: Sharma Covert;  Location: [facility] OR;  Service: Orthopedics;  Laterality: Left;  I&D Right Long and Index Fingers with Tendon Repair as Needed    No family history on file.  History  Substance Use Topics  . Smoking status: Never Smoker   . Smokeless tobacco: Not on file  . Alcohol Use: Yes     Comment: occ      Review of Systems  Constitutional: Negative for activity change.       All ROS Neg except as noted in HPI  HENT: Negative for nosebleeds and neck pain.   Eyes: Negative for photophobia and discharge.  Respiratory: Negative for cough, shortness of breath and wheezing.   Cardiovascular: Positive for chest pain. Negative for palpitations.  Gastrointestinal: Negative for abdominal pain and blood in stool.  Genitourinary: Negative for dysuria, frequency and hematuria.  Musculoskeletal: Negative for back pain and arthralgias.       Varicose veins  Skin: Negative.    Neurological: Negative for dizziness, seizures and speech difficulty.  Psychiatric/Behavioral: Negative for hallucinations and confusion.    Allergies  Review of patient's allergies indicates no known allergies.  Home Medications   Current Outpatient Rx  Name  Route  Sig  Dispense  Refill  . enalapril (VASOTEC) 10 MG tablet   Oral   Take 10 mg by mouth daily.         Marland Kitchen oxyCODONE-acetaminophen (PERCOCET/ROXICET) 5-325 MG per tablet   Oral   Take 1 tablet by mouth every 4 (four) hours as needed for pain.           BP 132/81  Pulse 74  Temp(Src) 98.1 F (36.7 C) (Oral)  Resp 20  Ht 6\' 5"  (1.956 m)  Wt 340 lb (154.223 kg)  BMI 40.31 kg/m2  SpO2 97%  Physical Exam  Nursing note and vitals reviewed. Constitutional: He is oriented to person, place, and time. He appears well-developed and well-nourished.  Non-toxic appearance.  HENT:  Head: Normocephalic.  Right Ear: Tympanic membrane and external ear normal.  Left Ear: Tympanic membrane and external ear normal.  Eyes: EOM and lids are normal. Pupils are equal, round, and reactive to light.  Neck: Normal range of motion. Neck supple. Carotid bruit is not present.  Cardiovascular: Normal rate, regular rhythm, normal heart sounds, intact distal pulses and normal pulses.   Pulmonary/Chest: Breath sounds normal. No respiratory distress.  Abdominal:  Soft. Bowel sounds are normal. There is no tenderness. There is no guarding.  Musculoskeletal: Normal range of motion.  There is dried blood from the mid medial calf area on the right lower extremity extending down to the ankle. There is no active bleeding noted at this time. Cannot identify the exact site of the bleeding at this time. The dorsalis pedis and posterior tibial pulses are symmetrical. There no hot areas of the lower extremities. There are multiple varicose veins of the right and left lower extremities.  Lymphadenopathy:       Head (right side): No submandibular  adenopathy present.       Head (left side): No submandibular adenopathy present.    He has no cervical adenopathy.  Neurological: He is alert and oriented to person, place, and time. He has normal strength. No cranial nerve deficit or sensory deficit.  Skin: Skin is warm and dry.  Psychiatric: He has a normal mood and affect. His speech is normal.    ED Course  Procedures (including critical care time)  Labs Reviewed - No data to display No results found.   No diagnosis found.    MDM  I have reviewed nursing notes, vital signs, and all appropriate lab and imaging results for this patient. Upon arrival to the emergency department the bleeding of the right lower extremity had stopped. The area was cleansed and observed with no bleeding. The patient was ambulated in the without reproduction of the bleeding area. The patient has a dressing applied to the area that was bleeding. He is asked to keep the right lower extremity elevated today. He is given instructions to return immediately if the bleeding recurs for adequate therapy and treatment. The sclerae were discussed with the patient terms which he understands, and he is in full agreement.       Kathie Dike, [place] 01/05/13 1221

## 2013-01-05 NOTE — ED Notes (Signed)
Pt returns to ED this evening c/o mid sternal chest pain that began this afternoon at 1 pm while lying on the sofa. Pt refers to pain as radiating into arm and neck, starting in the chest. Pt also reports n/v  With emesis x 2.   No acute distress noted at this time. No diaphoresis noted or SOB.

## 2013-01-05 NOTE — ED Notes (Signed)
Pt ambulated around nursing station four times without any further bleeding of affected leg.

## 2013-01-05 NOTE — ED Notes (Signed)
Pt at home in shower and began bleeding from leg. Denies cutting leg. Just started bleeding.

## 2013-01-05 NOTE — ED Provider Notes (Signed)
History     CSN: [id number]  Arrival date & time 01/05/13  1659   First MD Initiated Contact with Patient 01/05/13 1713      Chief Complaint  Patient presents with  . Chest Pain     HPI Patient presented with chest pain which he states had off and on since yesterday.  Had some shortness breath and nausea.  Has had numerous evaluations in ER visits for same.  Patient has had recent evaluation by cardiologist which included a dobutamine stress echo.  No serious abnormalities were noted.  Cardiologist notes reviewed.       Past Medical History  Diagnosis Date  . Chest pain   . HTN (hypertension)     Past Surgical History  Procedure Laterality Date  . I&d extremity  11/29/2011    Procedure: IRRIGATION AND DEBRIDEMENT EXTREMITY;  Surgeon: Sharma Covert;  Location: [facility] OR;  Service: Orthopedics;  Laterality: Left;  I&D Right Long and Index Fingers with Tendon Repair as Needed    No family history on file.  History  Substance Use Topics  . Smoking status: Never Smoker   . Smokeless tobacco: Not on file  . Alcohol Use: Yes     Comment: occ   Review of cardiology notes: Chest pain  No further workup indicated. Recent 2-D echocardiogram was within normal limits. Review of patient's history of CP is without any correlation with exertion. Therefore, I feel that there is no current indication to proceed with a stress test, noting that patient had a reportedly negative GXT study back in March of this year, with Dr. Gerda Diss. We will have him return to Dr. Myrtis Ser on an as needed basis.  HTN (hypertension)  I strongly recommended that he return to Dr. Gerda Diss for early followup, and we will assist him with an appointment today. If he demonstrates persistently elevated BP readings, then he may require medical therapy, in addition to lifestyle modification changes. In any event, I will defer to Dr. Gerda Diss regarding continued monitoring and recommended management for this.    Review of  Systems All other systems reviewed and are negative Allergies  Review of patient's allergies indicates no known allergies.  Home Medications   Current Outpatient Rx  Name  Route  Sig  Dispense  Refill  . enalapril (VASOTEC) 10 MG tablet   Oral   Take 10 mg by mouth daily.         Marland Kitchen oxyCODONE-acetaminophen (PERCOCET/ROXICET) 5-325 MG per tablet   Oral   Take 1 tablet by mouth every 4 (four) hours as needed for pain.           BP 144/65  Pulse 65  Temp(Src) 98.7 F (37.1 C) (Oral)  Resp 15  SpO2 100%  Physical Exam  Nursing note and vitals reviewed. Constitutional: He is oriented to person, place, and time. He appears well-developed and well-nourished. No distress.  HENT:  Head: Normocephalic and atraumatic.  Eyes: Pupils are equal, round, and reactive to light.  Neck: Normal range of motion.  Cardiovascular: Normal rate and intact distal pulses.   Pulmonary/Chest: No respiratory distress.    Abdominal: Normal appearance. He exhibits no distension.  Musculoskeletal: Normal range of motion.  Neurological: He is alert and oriented to person, place, and time. No cranial nerve deficit.  Skin: Skin is warm and dry. No rash noted.  Psychiatric: He has a normal mood and affect. His behavior is normal.    ED Course  Procedures (including critical care time)  Date: 01/05/2013  Rate: 64  Rhythm: normal sinus rhythm  QRS Axis: Left axis deviation  Intervals: normal  ST/T Wave abnormalities: normal  Conduction Disutrbances: none  Narrative Interpretation: unremarkable  Meds ordered this encounter  Medications  . ketorolac (TORADOL) 30 MG/ML injection 30 mg    Sig:       Labs Reviewed  POCT I-STAT, CHEM 8 - Abnormal; Notable for the following:    Glucose, Bld 116 (*)    All other components within normal limits  POCT I-STAT TROPONIN I  POCT I-STAT TROPONIN I   No results found.   1. Chest wall pain       MDM       Nelia Shi, MD 01/05/13  2055

## 2013-01-06 NOTE — ED Provider Notes (Signed)
Medical screening examination/treatment/procedure(s) were performed by non-physician practitioner and as supervising physician I was immediately available for consultation/collaboration.   Hyder Deman M Michille Mcelrath, DO 01/06/13 2111 

## 2013-03-14 ENCOUNTER — Encounter: Payer: Self-pay | Admitting: Family Medicine

## 2013-03-14 ENCOUNTER — Ambulatory Visit (INDEPENDENT_AMBULATORY_CARE_PROVIDER_SITE_OTHER): Payer: BC Managed Care – PPO | Admitting: Family Medicine

## 2013-03-14 VITALS — BP 118/84 | Temp 98.4°F | Wt 334.0 lb

## 2013-03-14 DIAGNOSIS — J322 Chronic ethmoidal sinusitis: Secondary | ICD-10-CM

## 2013-03-14 MED ORDER — AMOXICILLIN-POT CLAVULANATE 875-125 MG PO TABS: 1.0000 | ORAL_TABLET | Freq: Two times a day (BID) | ORAL | Status: AC

## 2013-03-14 MED ORDER — FLUTICASONE PROPIONATE 50 MCG/ACT NA SUSP: NASAL | Status: DC

## 2013-03-14 NOTE — Progress Notes (Signed)
  Subjective:    Patient ID: James Rivas, male    DOB: 20-Mar-1971, 42 y.o.   MRN: [id number]  Sinusitis This is a new problem. The current episode started in the past 7 days. The problem has been gradually worsening since onset. There has been no fever. His pain is at a severity of 4/10. The pain is moderate. Associated symptoms include congestion, coughing, a hoarse voice, sinus pressure and sneezing. Past treatments include oral decongestants. The treatment provided mild relief.  positive hx of allergies. Some cong in the chest.  No meds, dealing with it  Review of Systems  HENT: Positive for congestion, hoarse voice, sneezing and sinus pressure.   Respiratory: Positive for cough.   otherwise neg     Objective:   Physical Exam  Alert mild malaise. Vitals reviewed. HEENT moderate nasal congestion. Frontal tenderness. Pharynx slight erythema neck supple. Lungs clear. Heart regular rate and rhythm.     Assessment & Plan:  Impression sinusitis-discussed. #2 allergic rhinitis discussed. Plan Augmentin twice a day for 10 days.

## 2013-03-16 MED ORDER — ENALAPRIL MALEATE 10 MG PO TABS: 20.0000 mg | ORAL_TABLET | Freq: Every day | ORAL | Status: DC

## 2013-04-03 ENCOUNTER — Encounter: Payer: Self-pay | Admitting: *Deleted

## 2013-04-04 ENCOUNTER — Ambulatory Visit (HOSPITAL_COMMUNITY)
Admission: RE | Admit: 2013-04-04 | Discharge: 2013-04-04 | Disposition: A | Discharge status: Home / Self Care | Payer: BC Managed Care – PPO | Source: Ambulatory Visit | Attending: Family Medicine | Admitting: Family Medicine

## 2013-04-04 ENCOUNTER — Encounter: Payer: Self-pay | Admitting: Family Medicine

## 2013-04-04 ENCOUNTER — Ambulatory Visit (INDEPENDENT_AMBULATORY_CARE_PROVIDER_SITE_OTHER): Payer: BC Managed Care – PPO | Admitting: Family Medicine

## 2013-04-04 VITALS — BP 138/82 | Temp 98.7°F | Wt 328.0 lb

## 2013-04-04 DIAGNOSIS — R0600 Dyspnea, unspecified: Secondary | ICD-10-CM

## 2013-04-04 DIAGNOSIS — I10 Essential (primary) hypertension: Secondary | ICD-10-CM | POA: Insufficient documentation

## 2013-04-04 DIAGNOSIS — R0989 Other specified symptoms and signs involving the circulatory and respiratory systems: Secondary | ICD-10-CM

## 2013-04-04 DIAGNOSIS — R0602 Shortness of breath: Secondary | ICD-10-CM | POA: Insufficient documentation

## 2013-04-04 DIAGNOSIS — R0609 Other forms of dyspnea: Secondary | ICD-10-CM

## 2013-04-04 MED ORDER — CITALOPRAM HYDROBROMIDE 20 MG PO TABS: ORAL_TABLET | ORAL | Status: DC

## 2013-04-04 NOTE — Progress Notes (Signed)
  Subjective:    Patient ID: James Rivas, male    DOB: 1971-08-15, 42 y.o.   MRN: [id number]  Chest Pain  This is a recurrent problem. The current episode started in the past 7 days. The onset quality is gradual. The problem has been gradually improving. The pain is at a severity of 6/10. The pain is moderate. The pain does not radiate. Associated symptoms include shortness of breath. Pertinent negatives include no diaphoresis or nausea. The pain is aggravated by emotional upset. He has tried NSAIDs for the symptoms. There are no known risk factors.  His past medical history is significant for anxiety/panic attacks.   Patient notes a lot of stress also. Father died relatively suddenly just one week ago.  Patient had a heart workup last year. This was negative. Patient seen in emergency room a chest pain earlier this year with negative results.  Patient admits to a lot of stress. Irritability times. Feels down. No suicidal thoughts. Hates his job. Notes that his mental symptoms often correlate with his physical   Review of Systems  Constitutional: Negative for diaphoresis.  Respiratory: Positive for shortness of breath.   Cardiovascular: Positive for chest pain.  Gastrointestinal: Negative for nausea.   ROS otherwise negative    Objective:   Physical Exam  Alert no acute distress. HEENT normal. Lungs clear. Heart regular in rhythm. Blood pressure good. Abdomen benign.      Assessment & Plan:  #1 dyspnea likely related to #2, though some workup warranted. #2 depression with anxiety and intermittent symptoms #3 lateral chest wall pain nonspecific. #4 hypertension good control. Plan easily 25 minutes spent most in discussion. Chest x-ray and pulmonary function test. Initiate Celexa 20 every morning per week then 40 2 AM. Xanax 0.5 use sparingly. Recheck in 6 weeks. Diet exercise discussed. WSL

## 2013-04-04 NOTE — Progress Notes (Signed)
PFT scheduled [facility] Apr 18, 2013 at 9am. Pt notified of appt

## 2013-04-17 ENCOUNTER — Other Ambulatory Visit: Payer: Self-pay | Admitting: *Deleted

## 2013-04-17 MED ORDER — ENALAPRIL MALEATE 20 MG PO TABS: 20.0000 mg | ORAL_TABLET | Freq: Every day | ORAL | Status: DC

## 2013-04-18 ENCOUNTER — Ambulatory Visit (HOSPITAL_COMMUNITY)
Admission: RE | Admit: 2013-04-18 | Discharge: 2013-04-18 | Disposition: A | Discharge status: Home / Self Care | Payer: BC Managed Care – PPO | Source: Ambulatory Visit | Attending: Family Medicine | Admitting: Family Medicine

## 2013-04-18 DIAGNOSIS — R0602 Shortness of breath: Secondary | ICD-10-CM | POA: Insufficient documentation

## 2013-04-29 NOTE — Procedures (Signed)
NAME:  James Rivas, James Rivas                   ACCOUNT NO.:  [id number]  MEDICAL RECORD NO.:  [id number]  LOCATION:  RESP                          FACILITY:  [facility]  PHYSICIAN:  Ruston Fedora L. Juanetta Gosling, M.D.DATE OF BIRTH:  Jun 01, 1971  DATE OF PROCEDURE: DATE OF DISCHARGE:  04/18/2013                           PULMONARY FUNCTION TEST   REASON FOR PULMONARY FUNCTION TESTING:  Shortness of breath. 1. Spirometry shows a minimal ventilatory defect with some evidence of     airflow obstruction. 2. Lung volumes show reduction of total lung capacity which may     indicate a restrictive disease. 3. DLCO is normal. 4. Airway resistance is normal. 5. Noting the patient's height and weight, some of this may be related     to body habitus.     Hernandez Losasso L. Juanetta Gosling, M.D.     [facility]/MED[facility]  D:  04/28/2013  T:  04/29/2013  Job:  [id number]  cc:   Donna Bernard, M.D. Fax: [phone]

## 2013-04-30 LAB — PULMONARY FUNCTION TEST

## 2013-05-13 ENCOUNTER — Ambulatory Visit (INDEPENDENT_AMBULATORY_CARE_PROVIDER_SITE_OTHER): Payer: BC Managed Care – PPO | Admitting: Family Medicine

## 2013-05-13 ENCOUNTER — Encounter: Payer: Self-pay | Admitting: Family Medicine

## 2013-05-13 VITALS — BP 130/80 | Wt 337.0 lb

## 2013-05-13 DIAGNOSIS — R0600 Dyspnea, unspecified: Secondary | ICD-10-CM

## 2013-05-13 DIAGNOSIS — R0989 Other specified symptoms and signs involving the circulatory and respiratory systems: Secondary | ICD-10-CM

## 2013-05-13 DIAGNOSIS — R0609 Other forms of dyspnea: Secondary | ICD-10-CM

## 2013-05-13 NOTE — Patient Instructions (Signed)
Try to exercise regularly and stick with your meds

## 2013-05-18 DIAGNOSIS — R0600 Dyspnea, unspecified: Secondary | ICD-10-CM | POA: Insufficient documentation

## 2013-05-18 NOTE — Progress Notes (Signed)
  Subjective:    Patient ID: James Rivas, male    DOB: December 28, 1970, 42 y.o.   MRN: [id number]  HPI Patient arrives office for followup of shortness of breath. Reports that his symptoms have improved. Taking the Celexa regularly. Less shortness of breath   Review of Systems No chest pain no abdominal pain no headache    Objective:   Physical Exam Alert significant obesity present lungs clear. Heart regular in rhythm. HEENT normal.       Assessment & Plan:  Impression dyspnea pulmonary function tests reviewed. It does show diffuse diminished in. Likely this is due to the patient's morbid obesity and deconditioning. Discussed. Plan regular exercise encourage. Maintain Celexa. Some of these sudden shortness of breath spells he was experiencing likely was due to anxiety. Patient aware of this. Followup as scheduled. WSL

## 2013-06-20 ENCOUNTER — Encounter: Payer: Self-pay | Admitting: Family Medicine

## 2013-06-20 ENCOUNTER — Ambulatory Visit (INDEPENDENT_AMBULATORY_CARE_PROVIDER_SITE_OTHER): Payer: BC Managed Care – PPO | Admitting: Family Medicine

## 2013-06-20 VITALS — BP 132/90 | Temp 98.4°F | Wt 339.0 lb

## 2013-06-20 DIAGNOSIS — R079 Chest pain, unspecified: Secondary | ICD-10-CM

## 2013-06-20 MED ORDER — CHLORZOXAZONE 500 MG PO TABS: 500.0000 mg | ORAL_TABLET | Freq: Three times a day (TID) | ORAL | Status: DC | PRN

## 2013-06-20 MED ORDER — ETODOLAC 400 MG PO TABS: 400.0000 mg | ORAL_TABLET | Freq: Two times a day (BID) | ORAL | Status: DC

## 2013-06-20 NOTE — Progress Notes (Signed)
  Subjective:    Patient ID: James Rivas, male    DOB: [birth year], 42 y.o.   MRN: [id number]  HPI Pt notes chest pain--highly painful. Lateral chest.  No dyspnea.  Pain was sharp at its worst. Increased when turning in certain directions.  Worse getting out of bed.  Sticking with blood presure med. Left work yesterday due to pain. Took one oxycodone for pain as needed. Took ibuprofen yest while at work  Patient still worried about potential for serious underlying etiology. Review of Systems    no fever no cough no congestion chest pain is very intermittent. Will occur for a few days then go away for weeks at a time. Objective:   Physical Exam Significant obesity. Alert no acute distress. HEENT normal. Vitals reviewed. Lungs clear. Heart regular in rhythm. No chest wall tenderness. Abdomen benign.  EKG normal sinus rhythm no significant ST-T changes atypical T-wave changes.       Assessment & Plan:  Impression chest pain old records reviewed at length easily 25 minutes spent with patient most in discussion and review of data. His cardiologist did advise the pain continues would need some type of dynamic stress test with nuclear medicine imaging or echo. See their reports. This is discussed at length with patient. Plan trial of anti-inflammatory in and I spasm. Maintain other meds. Cardiology reconsultation rationale discussed. WSL

## 2013-06-22 DIAGNOSIS — G8929 Other chronic pain: Secondary | ICD-10-CM | POA: Insufficient documentation

## 2013-06-22 HISTORY — DX: Other chronic pain: G89.29

## 2013-06-23 ENCOUNTER — Encounter (HOSPITAL_COMMUNITY): Payer: Self-pay

## 2013-06-23 ENCOUNTER — Emergency Department (HOSPITAL_COMMUNITY): Payer: BC Managed Care – PPO

## 2013-06-23 ENCOUNTER — Emergency Department (HOSPITAL_COMMUNITY)
Admission: EM | Admit: 2013-06-23 | Discharge: 2013-06-23 | Disposition: A | Discharge status: Home / Self Care | Payer: BC Managed Care – PPO | Attending: Emergency Medicine | Admitting: Emergency Medicine

## 2013-06-23 DIAGNOSIS — N201 Calculus of ureter: Secondary | ICD-10-CM | POA: Insufficient documentation

## 2013-06-23 DIAGNOSIS — R350 Frequency of micturition: Secondary | ICD-10-CM | POA: Insufficient documentation

## 2013-06-23 DIAGNOSIS — Z79899 Other long term (current) drug therapy: Secondary | ICD-10-CM | POA: Insufficient documentation

## 2013-06-23 DIAGNOSIS — I10 Essential (primary) hypertension: Secondary | ICD-10-CM | POA: Insufficient documentation

## 2013-06-23 DIAGNOSIS — R110 Nausea: Secondary | ICD-10-CM | POA: Insufficient documentation

## 2013-06-23 DIAGNOSIS — Z8679 Personal history of other diseases of the circulatory system: Secondary | ICD-10-CM | POA: Insufficient documentation

## 2013-06-23 DIAGNOSIS — N23 Unspecified renal colic: Secondary | ICD-10-CM | POA: Insufficient documentation

## 2013-06-23 LAB — POCT I-STAT, CHEM 8
BUN: 15 mg/dL (ref 6–23)
Calcium, Ion: 1.21 mmol/L (ref 1.12–1.23)
Chloride: 106 mEq/L (ref 96–112)
Creatinine, Ser: 1.2 mg/dL (ref 0.50–1.35)
Glucose, Bld: 97 mg/dL (ref 70–99)
HCT: 47 % (ref 39.0–52.0)
Hemoglobin: 16 g/dL (ref 13.0–17.0)
Potassium: 4.9 mEq/L (ref 3.5–5.1)
Sodium: 142 mEq/L (ref 135–145)
TCO2: 26 mmol/L (ref 0–100)

## 2013-06-23 LAB — URINALYSIS, ROUTINE W REFLEX MICROSCOPIC
Glucose, UA: NEGATIVE mg/dL
Ketones, ur: NEGATIVE mg/dL
Leukocytes, UA: NEGATIVE
Nitrite: NEGATIVE
Protein, ur: NEGATIVE mg/dL
Specific Gravity, Urine: 1.02 (ref 1.005–1.030)
Urobilinogen, UA: 0.2 mg/dL (ref 0.0–1.0)
pH: 7.5 (ref 5.0–8.0)

## 2013-06-23 LAB — URINE MICROSCOPIC-ADD ON

## 2013-06-23 MED ORDER — OXYCODONE-ACETAMINOPHEN 5-325 MG PO TABS: 1.0000 | ORAL_TABLET | ORAL | Status: DC | PRN

## 2013-06-23 MED ORDER — TAMSULOSIN HCL 0.4 MG PO CAPS: 0.4000 mg | ORAL_CAPSULE | Freq: Every day | ORAL | Status: DC

## 2013-06-23 MED ORDER — OXYCODONE-ACETAMINOPHEN 5-325 MG PO TABS
2.0000 | ORAL_TABLET | Freq: Once | ORAL | Status: AC
  Administered 2013-06-23: 2 via ORAL
  Filled 2013-06-23 (×2): qty 1

## 2013-06-23 MED ORDER — IBUPROFEN 400 MG PO TABS
600.0000 mg | ORAL_TABLET | Freq: Once | ORAL | Status: DC
  Filled 2013-06-23: qty 1

## 2013-06-23 MED ORDER — IBUPROFEN 800 MG PO TABS
800.0000 mg | ORAL_TABLET | Freq: Once | ORAL | Status: AC
  Administered 2013-06-23: 800 mg via ORAL
  Filled 2013-06-23: qty 1

## 2013-06-23 MED ORDER — ONDANSETRON 4 MG PO TBDP
4.0000 mg | ORAL_TABLET | Freq: Once | ORAL | Status: AC
  Administered 2013-06-23: 4 mg via ORAL
  Filled 2013-06-23: qty 1

## 2013-06-23 NOTE — ED Notes (Signed)
Pt reports ab pain since 5am today, +nausea, +dry heaves. No diarrhea or fever.  Has taken no meds for symptoms. Pt points to mid lower ab area, above his bladder as painful. +painful urination.  Denies any blood in his urine. Denies any penile discharge.

## 2013-06-23 NOTE — ED Provider Notes (Signed)
CSN: [id number]     Arrival date & time 06/23/13  1302 History    This chart was scribed for James Razor, MD, by Yevette Edwards, ED Scribe. This patient was seen in room [facility] and the patient's care was started at 2:03 PM.   First MD Initiated Contact with Patient 06/23/13 1338     Chief Complaint  Patient presents with  . Abdominal Pain    The history is provided by the patient. No language interpreter was used.   HPI Comments: James Rivas is a 42 y.o.  who presents to the Emergency Department complaining of constant abdominal pain which began seven hours ago this morning. He describes the pain as "burning" and "sharp."  He has not attempted to alleviate the pain.  The pt reports that urination increases pain. He has experienced frequency and nausea. He denies experiencing any hematuria, fever, emesis, testicular swelling, and penile discharge. He denies a h/o of any similar  symptoms.  He also denies a h/o of any abdominal surgeries. He has a h/o HTN. The pt denies smoking, but he uses alcohol.   Past Medical History  Diagnosis Date  . Chest pain   . HTN (hypertension)   . Allergy   . Impaired fasting glucose   . Morbid obesity   . IFG (impaired fasting glucose)   . Venous stasis    Past Surgical History  Procedure Laterality Date  . I&d extremity  11/29/2011    Procedure: IRRIGATION AND DEBRIDEMENT EXTREMITY;  Surgeon: Sharma Covert;  Location: [facility] OR;  Service: Orthopedics;  Laterality: Left;  I&D Right Long and Index Fingers with Tendon Repair as Needed  . Finger surgery Left     left ring finger 2 surgeries   Family History  Problem Relation Age of Onset  . Diabetes Father   . Diabetes Paternal Aunt    History  Substance Use Topics  . Smoking status: Never Smoker   . Smokeless tobacco: Not on file  . Alcohol Use: Yes     Comment: occ    Review of Systems  Constitutional: Negative for fever and chills.  Gastrointestinal: Positive for nausea and abdominal  pain. Negative for vomiting.  Genitourinary: Positive for frequency. Negative for penile pain and testicular pain.  All other systems reviewed and are negative.    Allergies  Review of patient's allergies indicates no known allergies.  Home Medications   Current Outpatient Rx  Name  Route  Sig  Dispense  Refill  . chlorzoxazone (PARAFON) 500 MG tablet   Oral   Take 1 tablet (500 mg total) by mouth 3 (three) times daily as needed for muscle spasms.   42 tablet   0   . citalopram (CELEXA) 20 MG tablet   Oral   Take 20 mg by mouth daily.         . enalapril (VASOTEC) 20 MG tablet   Oral   Take 1 tablet (20 mg total) by mouth daily.   30 tablet   2   . etodolac (LODINE) 400 MG tablet   Oral   Take 1 tablet (400 mg total) by mouth 2 (two) times daily.   28 tablet   0   . fluticasone (FLONASE) 50 MCG/ACT nasal spray      Two sprays per nostril daily   16 g   2    Triage Vitals: BP 149/78  Pulse 84  Temp(Src) 98.1 F (36.7 C) (Oral)  Resp 20  Ht 6\' 5"  (  1.956 m)  Wt 330 lb (149.687 kg)  BMI 39.12 kg/m2  SpO2 97%  Physical Exam  Nursing note and vitals reviewed. Constitutional: He is oriented to person, place, and time. He appears well-developed and well-nourished.  Non-toxic appearance. He does not appear ill. No distress.  HENT:  Head: Normocephalic and atraumatic.  Right Ear: External ear normal.  Left Ear: External ear normal.  Nose: Nose normal. No mucosal edema or rhinorrhea.  Mouth/Throat: Oropharynx is clear and moist and mucous membranes are normal. No dental abscesses or edematous.  Eyes: Conjunctivae and EOM are normal. Pupils are equal, round, and reactive to light.  Neck: Normal range of motion and full passive range of motion without pain. Neck supple.  Cardiovascular: Normal rate, regular rhythm and normal heart sounds.  Exam reveals no gallop and no friction rub.   No murmur heard. Pulmonary/Chest: Effort normal and breath sounds normal. No  respiratory distress. He has no wheezes. He has no rhonchi. He has no rales. He exhibits no tenderness and no crepitus.  Abdominal: Soft. Normal appearance and bowel sounds are normal. He exhibits no distension. There is no tenderness. There is no rebound and no guarding.  No CVA tenderness.  No abdominal tenderness.   Genitourinary:  Normal external male genitalia.  No discharge.  No hernia appreciated.  No significant tenderness.   Musculoskeletal: Normal range of motion. He exhibits no edema and no tenderness.  Moves all extremities well.   Neurological: He is alert and oriented to person, place, and time. He has normal strength. No cranial nerve deficit.  Skin: Skin is warm, dry and intact. No rash noted. No erythema. No pallor.  Psychiatric: He has a normal mood and affect. His speech is normal and behavior is normal. His mood appears not anxious.    ED Course   Medications  oxyCODONE-acetaminophen (PERCOCET/ROXICET) 5-325 MG per tablet 2 tablet (2 tablets Oral Given 06/23/13 1428)  ondansetron (ZOFRAN-ODT) disintegrating tablet 4 mg (4 mg Oral Given 06/23/13 1427)  ibuprofen (ADVIL,MOTRIN) tablet 800 mg (800 mg Oral Given 06/23/13 1428)    DIAGNOSTIC STUDIES: Oxygen Saturation is 97% on room air, normal by my interpretation.    COORDINATION OF CARE:  2:06 PM- Discussed treatment plan with patient, and the patient agreed to the plan.   Procedures (including critical care time)  Labs Reviewed  URINALYSIS, ROUTINE W REFLEX MICROSCOPIC - Abnormal; Notable for the following:    Hgb urine dipstick LARGE (*)    Bilirubin Urine SMALL (*)    All other components within normal limits  URINE MICROSCOPIC-ADD ON  POCT I-STAT, CHEM 8   Ct Abdomen Pelvis Wo Contrast  06/23/2013   *RADIOLOGY REPORT*  Clinical Data: Right-sided pain with hematuria  CT ABDOMEN AND PELVIS WITHOUT CONTRAST  Technique:  Multidetector CT imaging of the abdomen and pelvis was performed following the standard  protocol without intravenous contrast.  Comparison: None.  Findings: 11 mm subcapsular low density lesion in the inferior left liver is too small to characterize but likely reflects a tiny cyst. Otherwise no focal abnormalities seen in the liver or spleen on this study performed without intravenous contrast material. Stomach, duodenum, pancreas, gallbladder, and adrenal glands are unremarkable.  No stones are seen in either kidney. No hydronephrosis.  A 1 - 2 mm stone is identified at the right ureterovesical junction with mild fullness of the right ureter and subtle right periureteric stranding.  No left ureteral stones.  No abdominal aortic aneurysm.  No free fluid or  lymphadenopathy in the abdomen.  Imaging through the pelvis shows no free intraperitoneal fluid.  No pelvic sidewall lymphadenopathy.  Scattered diverticuli are seen in the left colon without diverticulitis.  The terminal ileum is normal.  The appendix is normal.  Small bone island is identified in the anterior left acetabulum. Bone windows reveal no worrisome lytic or sclerotic osseous lesions.  IMPRESSION: 1 - 2 mm stone at the right UVJ causes mild fullness in the right ureter.  No other urinary stones are evident.   Original Report Authenticated By: Kennith Center, M.D.   1. Ureteral colic   2. Right ureteral stone     MDM  42yM with abdominal pain. CT as above. Pain controlled. Renal function fine. UA not consistent with infection. Should be able to pass stone of this size.  PRN pain meds. Flomax. Expectant management. Urology FU as needed.  I personally preformed the services scribed in my presence. The recorded information has been reviewed is accurate. James Razor, MD.    James Razor, MD 06/26/13 [phone]

## 2013-07-01 ENCOUNTER — Encounter: Payer: Self-pay | Admitting: Family Medicine

## 2013-07-31 ENCOUNTER — Other Ambulatory Visit: Payer: Self-pay | Admitting: Family Medicine

## 2013-08-07 ENCOUNTER — Encounter: Payer: Self-pay | Admitting: Cardiovascular Disease

## 2013-08-07 ENCOUNTER — Encounter: Payer: Self-pay | Admitting: *Deleted

## 2013-08-07 ENCOUNTER — Ambulatory Visit (INDEPENDENT_AMBULATORY_CARE_PROVIDER_SITE_OTHER): Payer: BC Managed Care – PPO | Admitting: Cardiovascular Disease

## 2013-08-07 VITALS — BP 150/89 | HR 63 | Ht 77.0 in | Wt 340.0 lb

## 2013-08-07 DIAGNOSIS — R079 Chest pain, unspecified: Secondary | ICD-10-CM

## 2013-08-07 DIAGNOSIS — I10 Essential (primary) hypertension: Secondary | ICD-10-CM

## 2013-08-07 DIAGNOSIS — R0609 Other forms of dyspnea: Secondary | ICD-10-CM

## 2013-08-07 MED ORDER — CHLORTHALIDONE 25 MG PO TABS: 25.0000 mg | ORAL_TABLET | Freq: Every day | ORAL | Status: DC

## 2013-08-07 NOTE — Progress Notes (Signed)
SUBJECTIVE: Mr. Grabe is a 42 yr old man with a h/o atypical chest pain (deemed to be musculoskeletal in etiology), dyspnea on exertion, as well as a PMH significant for morbid obesity, HTN, venous insufficiency, and impaired fasting glucose. An ECG on 06-20-13 showed sinus bradycardia, 57 bpm, with a 1st degree AV block, PR 226 msec. PFT's in May 2014 showed minimal airway obstruction. An echocardiogram in October 2013 revealed normal LV systolic function, grade I diastolic dysfunction, and mild RVE/RAE. He reportedly had a normal GXT in March 2013.  He says his BP fluctuates, and his BP had been 120/72 mmHg last week, and has it checked at work every week.   He continues to have chest pain intermittently, and describes it as an ache in the left precordium. It may last 1-2 days off and on, then subside for a few weeks, and only occurs at night. 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 He denies any burning sensation in his throat nor a sour taste in the mouth. He denies GERD. He went to the [facility] ED in January with chest pain, and was told it was a muscle spasm.   He exercises at the [facility] without dyspnea or chest pain.    No Known Allergies  Current Outpatient Prescriptions  Medication Sig Dispense Refill  . chlorzoxazone (PARAFON) 500 MG tablet Take 1 tablet (500 mg total) by mouth 3 (three) times daily as needed for muscle spasms.  42 tablet  0  . citalopram (CELEXA) 20 MG tablet Take 20 mg by mouth daily.      . enalapril (VASOTEC) 20 MG tablet take 1 tablet once daily  30 tablet  2  . etodolac (LODINE) 400 MG tablet Take 1 tablet (400 mg total) by mouth 2 (two) times daily.  28 tablet  0  . fluticasone (FLONASE) 50 MCG/ACT nasal spray Two sprays per nostril daily  16 g  2  . oxyCODONE-acetaminophen (PERCOCET/ROXICET) 5-325 MG per tablet Take 1-2 tablets by mouth every 4 (four) hours as needed for  pain.  15 tablet  0   No current facility-administered medications for this visit.    Past Medical History  Diagnosis Date  . Chest pain   . HTN (hypertension)   . Allergy   . Impaired fasting glucose   . Morbid obesity   . IFG (impaired fasting glucose)   . Venous stasis     Past Surgical History  Procedure Laterality Date  . I&d extremity  11/29/2011    Procedure: IRRIGATION AND DEBRIDEMENT EXTREMITY;  Surgeon: Sharma Covert;  Location: [facility] OR;  Service: Orthopedics;  Laterality: Left;  I&D Right Long and Index Fingers with Tendon Repair as Needed  . Finger surgery Left     left ring finger 2 surgeries    History   Social History  . Marital Status: Married    Spouse Name: N/A    Number of Children: N/A  . Years of Education: N/A   Occupational History  . Not on file.   Social History Main Topics  . Smoking status: Never Smoker   . Smokeless tobacco: Not on file  . Alcohol Use: Yes     Comment: occ  . Drug Use: No  . Sexual Activity: No   Other Topics Concern  . Not on file   Social History Narrative  . No narrative on file  Filed Vitals:   08/07/13 0841  BP: 150/89  Pulse: 63  Height: 6\' 5"  (1.956 m)  Weight: 340 lb (154.223 kg)    PHYSICAL EXAM General: NAD Neck: No JVD, no thyromegaly or thyroid nodule.  Lungs: Clear to auscultation bilaterally with normal respiratory effort. CV: Nondisplaced PMI.  Heart regular S1/ split S2, no S3/S4, no murmur.  No peripheral edema.  No carotid bruit.  Normal pedal pulses.  Abdomen: Soft, nontender, no hepatosplenomegaly, no distention.  Neurologic: Alert and oriented x 3.  Psych: Normal affect. Extremities: No clubbing or cyanosis.   ECG: reviewed and available in electronic records.      ASSESSMENT AND PLAN: 1. Chest pain: very atypical for cardiac pain, as it does not occur with exertion either at work or when he's exercising at the gym. Again, he has normal LV systolic function with no wall motion  abnormalities in October 2013 by echocardiography. He does have labile hypertension. Given the recurrence of this pain, I will obtain a stress echocardiogram to evaluate for any inducible wall motion abnormalities. 2. HTN: uncontrolled today. He is on a good dose of enalapril. I will start Chlorthalidone 25 mg daily and check a BMP in 1 week.   Prentice Docker, M.D., F.A.C.C.

## 2013-08-07 NOTE — Patient Instructions (Addendum)
Your physician has requested that you have a stress echocardiogram. For further information please visit [email or web address] Please follow instruction sheet as given. Begin Chlorthalidone 25mg  daily - new sent to pharm Continue all other medications.   Lab for BMET in 1 week - around August 14, 2013 Office will contact with results via phone or letter.   Follow up in  6 weeks

## 2013-08-14 ENCOUNTER — Ambulatory Visit: Payer: BC Managed Care – PPO | Admitting: Family Medicine

## 2013-08-15 ENCOUNTER — Ambulatory Visit (HOSPITAL_COMMUNITY)
Admission: RE | Admit: 2013-08-15 | Discharge: 2013-08-15 | Disposition: A | Discharge status: Home / Self Care | Payer: BC Managed Care – PPO | Source: Ambulatory Visit | Attending: Cardiovascular Disease | Admitting: Cardiovascular Disease

## 2013-08-15 ENCOUNTER — Encounter (HOSPITAL_COMMUNITY): Payer: Self-pay

## 2013-08-15 DIAGNOSIS — R0989 Other specified symptoms and signs involving the circulatory and respiratory systems: Secondary | ICD-10-CM | POA: Insufficient documentation

## 2013-08-15 DIAGNOSIS — R079 Chest pain, unspecified: Secondary | ICD-10-CM | POA: Insufficient documentation

## 2013-08-15 DIAGNOSIS — Z6841 Body Mass Index (BMI) 40.0 and over, adult: Secondary | ICD-10-CM | POA: Insufficient documentation

## 2013-08-15 DIAGNOSIS — R0600 Dyspnea, unspecified: Secondary | ICD-10-CM

## 2013-08-15 DIAGNOSIS — R0609 Other forms of dyspnea: Secondary | ICD-10-CM | POA: Insufficient documentation

## 2013-08-15 DIAGNOSIS — I10 Essential (primary) hypertension: Secondary | ICD-10-CM | POA: Insufficient documentation

## 2013-08-15 DIAGNOSIS — R072 Precordial pain: Secondary | ICD-10-CM

## 2013-08-15 NOTE — Progress Notes (Signed)
Stress Lab Nurses Notes - James Rivas  James Rivas 08/15/2013 Reason for doing test: Chest Pain and Dyspnea Type of test: Stress Echo Nurse performing test: Parke Poisson, RN Nuclear Medicine Tech: Not Applicable Echo Tech: Veda Canning MD performing test: Ival Bible / Joni Reining NP Family MD: Lubertha South Test explained and consent signed: yes IV started: No IV started Symptoms: Fatigue Treatment/Intervention: None Reason test stopped: fatigue After recovery IV was: NA Patient to return to Nuc. Med at :NA Patient discharged: Home Patient's Condition upon discharge was: stable Comments: During test peak BP 226/84 & HR 173.  Recovery BP 134/61 & HR 86.  Symptoms resolved in recovery. Erskine Speed T

## 2013-08-15 NOTE — Progress Notes (Signed)
*  PRELIMINARY RESULTS* Echocardiogram Echocardiogram stress test has been performed.  Yvetta Drotar 08/15/2013, 10:34 AM

## 2013-08-16 ENCOUNTER — Other Ambulatory Visit: Payer: Self-pay | Admitting: Cardiovascular Disease

## 2013-08-16 LAB — BASIC METABOLIC PANEL
CO2: 28 mEq/L (ref 19–32)
Calcium: 9.3 mg/dL (ref 8.4–10.5)
Creat: 1.01 mg/dL (ref 0.50–1.35)

## 2013-08-19 ENCOUNTER — Encounter: Payer: Self-pay | Admitting: *Deleted

## 2013-09-12 ENCOUNTER — Encounter: Payer: Self-pay | Admitting: Cardiovascular Disease

## 2013-09-12 ENCOUNTER — Ambulatory Visit (INDEPENDENT_AMBULATORY_CARE_PROVIDER_SITE_OTHER): Payer: BC Managed Care – PPO | Admitting: Cardiovascular Disease

## 2013-09-12 VITALS — BP 117/75 | HR 69 | Ht 77.0 in | Wt 337.8 lb

## 2013-09-12 DIAGNOSIS — Z136 Encounter for screening for cardiovascular disorders: Secondary | ICD-10-CM

## 2013-09-12 DIAGNOSIS — R079 Chest pain, unspecified: Secondary | ICD-10-CM

## 2013-09-12 DIAGNOSIS — IMO0001 Reserved for inherently not codable concepts without codable children: Secondary | ICD-10-CM

## 2013-09-12 DIAGNOSIS — R0609 Other forms of dyspnea: Secondary | ICD-10-CM

## 2013-09-12 DIAGNOSIS — I10 Essential (primary) hypertension: Secondary | ICD-10-CM

## 2013-09-12 NOTE — Patient Instructions (Signed)
Continue all current medications.  Follow up AS NEEDED  

## 2013-09-12 NOTE — Progress Notes (Signed)
Patient ID: James Rivas, male   DOB: Aug 22, 1971, 42 y.o.   MRN: [id number]      SUBJECTIVE: James Rivas had a normal stress echocardiogram, with normal LV systolic function. His BP is now controlled with the addition of chlorthalidone. His K was 4.1, BUN 17, creatinine 1.01. He has not had further recurrences of chest pain.    No Known Allergies  Current Outpatient Prescriptions  Medication Sig Dispense Refill  . chlorthalidone (HYGROTON) 25 MG tablet Take 1 tablet (25 mg total) by mouth daily.  30 tablet  6  . chlorzoxazone (PARAFON) 500 MG tablet Take 1 tablet (500 mg total) by mouth 3 (three) times daily as needed for muscle spasms.  42 tablet  0  . citalopram (CELEXA) 20 MG tablet Take 20 mg by mouth daily.      . enalapril (VASOTEC) 20 MG tablet take 1 tablet once daily  30 tablet  2  . fluticasone (FLONASE) 50 MCG/ACT nasal spray Two sprays per nostril daily  16 g  2  . oxyCODONE-acetaminophen (PERCOCET/ROXICET) 5-325 MG per tablet Take 1-2 tablets by mouth every 4 (four) hours as needed for pain.  15 tablet  0   No current facility-administered medications for this visit.    Past Medical History  Diagnosis Date  . Chest pain   . HTN (hypertension)   . Allergy   . Impaired fasting glucose   . Morbid obesity   . IFG (impaired fasting glucose)   . Venous stasis     Past Surgical History  Procedure Laterality Date  . I&d extremity  11/29/2011    Procedure: IRRIGATION AND DEBRIDEMENT EXTREMITY;  Surgeon: Sharma Covert;  Location: [facility] OR;  Service: Orthopedics;  Laterality: Left;  I&D Right Long and Index Fingers with Tendon Repair as Needed  . Finger surgery Left     left ring finger 2 surgeries    History   Social History  . Marital Status: Married    Spouse Name: N/A    Number of Children: N/A  . Years of Education: N/A   Occupational History  . Not on file.   Social History Main Topics  . Smoking status: Never Smoker   . Smokeless tobacco: Not on file  . Alcohol  Use: Yes     Comment: occ  . Drug Use: No  . Sexual Activity: No   Other Topics Concern  . Not on file   Social History Narrative  . No narrative on file     Filed Vitals:   09/12/13 1117  BP: 117/75  Pulse: 69  Height: 6\' 5"  (1.956 m)  Weight: 337 lb 12.8 oz (153.225 kg)  SpO2: 98%    PHYSICAL EXAM General: NAD Neck: No JVD, no thyromegaly or thyroid nodule.  Lungs: Clear to auscultation bilaterally with normal respiratory effort. CV: Nondisplaced PMI.  Heart regular S1/S2, no S3/S4, no murmur.  No peripheral edema.  No carotid bruit.  Normal pedal pulses.  Abdomen: Soft, nontender, no hepatosplenomegaly, no distention.  Neurologic: Alert and oriented x 3.  Psych: Normal affect. Extremities: No clubbing or cyanosis.   ECG: reviewed and available in electronic records.      ASSESSMENT AND PLAN: 1. Chest pain: atypical in character, with no further recurrences, and a normal stress echocardiogram portend a favorable prognosis. I encouraged exercise and weight loss. 2. HTN: controlled with the addition of chlorthalidone. I did encourage him to exercise and lose weight as this will help prevent cardiovascular events, and  also help to reduce BP.  Dispo: f/u prn.  Prentice Docker, M.D., F.A.C.C.

## 2013-10-17 ENCOUNTER — Encounter: Payer: Self-pay | Admitting: Family Medicine

## 2013-10-17 ENCOUNTER — Ambulatory Visit (INDEPENDENT_AMBULATORY_CARE_PROVIDER_SITE_OTHER): Payer: BC Managed Care – PPO | Admitting: Family Medicine

## 2013-10-17 VITALS — BP 142/80 | HR 80 | Ht 76.0 in | Wt 334.0 lb

## 2013-10-17 DIAGNOSIS — Z125 Encounter for screening for malignant neoplasm of prostate: Secondary | ICD-10-CM

## 2013-10-17 DIAGNOSIS — Z0000 Encounter for general adult medical examination without abnormal findings: Secondary | ICD-10-CM

## 2013-10-17 DIAGNOSIS — I10 Essential (primary) hypertension: Secondary | ICD-10-CM

## 2013-10-17 DIAGNOSIS — Z79899 Other long term (current) drug therapy: Secondary | ICD-10-CM

## 2013-10-17 MED ORDER — FLUTICASONE PROPIONATE 50 MCG/ACT NA SUSP: NASAL | Status: DC

## 2013-10-17 MED ORDER — CHLORZOXAZONE 500 MG PO TABS: 500.0000 mg | ORAL_TABLET | Freq: Three times a day (TID) | ORAL | Status: DC | PRN

## 2013-10-17 MED ORDER — CITALOPRAM HYDROBROMIDE 20 MG PO TABS: 20.0000 mg | ORAL_TABLET | Freq: Every day | ORAL | Status: DC

## 2013-10-17 MED ORDER — ENALAPRIL MALEATE 20 MG PO TABS: ORAL_TABLET | ORAL | Status: DC

## 2013-10-17 NOTE — Progress Notes (Signed)
  Subjective:    Patient ID: James Rivas, male    DOB: 24-Apr-1971, 42 y.o.   MRN: [id number]  HPIHere for a physical.  Sticking with bp meds. No side effects. Lowered salt intake. BP numb good elsewhere  declines flu vaccine.   Went on to see the cardiologist. Had a workup. Essentially cardiac disease has been ruled out. Patient appears to be willing to agree with this at this time.  Ongoing challenges with morbid obesity. We have offered consultation with bariatric surgeons in the past. Patient would prefer to work on it himself.     Review of Systems  Constitutional: Negative for fever, activity change and appetite change.  HENT: Negative for congestion and rhinorrhea.   Eyes: Negative for discharge.  Respiratory: Negative for cough and wheezing.   Cardiovascular: Negative for chest pain.  Gastrointestinal: Negative for vomiting, abdominal pain and blood in stool.  Genitourinary: Negative for frequency and difficulty urinating.  Musculoskeletal: Negative for neck pain.  Skin: Negative for rash.  Allergic/Immunologic: Negative for environmental allergies and food allergies.  Neurological: Negative for weakness and headaches.  Psychiatric/Behavioral: Negative for agitation.       Objective:   Physical Exam  Vitals reviewed. Constitutional: He appears well-developed and well-nourished.  HENT:  Head: Normocephalic and atraumatic.  Right Ear: External ear normal.  Left Ear: External ear normal.  Nose: Nose normal.  Mouth/Throat: Oropharynx is clear and moist.  Eyes: EOM are normal. Pupils are equal, round, and reactive to light.  Neck: Normal range of motion. Neck supple. No thyromegaly present.  Cardiovascular: Normal rate, regular rhythm and normal heart sounds.   No murmur heard. Pulmonary/Chest: Effort normal and breath sounds normal. No respiratory distress. He has no wheezes.  Abdominal: Soft. Bowel sounds are normal. He exhibits no distension and no mass. There is no  tenderness.  Genitourinary: Penis normal.  Musculoskeletal: Normal range of motion. He exhibits no edema.  Lymphadenopathy:    He has no cervical adenopathy.  Neurological: He is alert. He exhibits normal muscle tone.  Skin: Skin is warm and dry. No erythema.  Psychiatric: He has a normal mood and affect. His behavior is normal. Judgment normal.          Assessment & Plan:  Impression #1 preventive wellness exam #2 morbid obesity discussed. #3 hypertension clinically stable plan appropriate vaccines discussed. Maintain same blood pressure medication. Maintain all restriction meds. Diet exercise discussed. Appropriate blood work. Recheck in 6 months. WSL

## 2013-10-18 LAB — HEPATIC FUNCTION PANEL
ALT: 12 U/L (ref 0–53)
AST: 16 U/L (ref 0–37)
Albumin: 4.2 g/dL (ref 3.5–5.2)
Alkaline Phosphatase: 55 U/L (ref 39–117)
Total Bilirubin: 0.5 mg/dL (ref 0.3–1.2)

## 2013-10-18 LAB — LIPID PANEL
Cholesterol: 185 mg/dL (ref 0–200)
HDL: 44 mg/dL (ref >39)
Triglycerides: 77 mg/dL (ref <150)

## 2013-10-30 ENCOUNTER — Encounter: Payer: Self-pay | Admitting: Family Medicine

## 2013-12-01 ENCOUNTER — Other Ambulatory Visit: Payer: Self-pay | Admitting: Family Medicine

## 2014-02-26 ENCOUNTER — Other Ambulatory Visit: Payer: Self-pay | Admitting: Family Medicine

## 2014-03-23 ENCOUNTER — Encounter: Payer: Self-pay | Admitting: Family Medicine

## 2014-03-23 ENCOUNTER — Ambulatory Visit (INDEPENDENT_AMBULATORY_CARE_PROVIDER_SITE_OTHER): Payer: BC Managed Care – PPO | Admitting: Family Medicine

## 2014-03-23 ENCOUNTER — Ambulatory Visit (HOSPITAL_COMMUNITY)
Admission: RE | Admit: 2014-03-23 | Discharge: 2014-03-23 | Disposition: A | Discharge status: Home / Self Care | Payer: BC Managed Care – PPO | Source: Ambulatory Visit | Attending: Family Medicine | Admitting: Family Medicine

## 2014-03-23 VITALS — BP 140/90 | Ht 76.0 in | Wt 330.0 lb

## 2014-03-23 DIAGNOSIS — I8390 Asymptomatic varicose veins of unspecified lower extremity: Secondary | ICD-10-CM | POA: Insufficient documentation

## 2014-03-23 DIAGNOSIS — I878 Other specified disorders of veins: Secondary | ICD-10-CM | POA: Insufficient documentation

## 2014-03-23 DIAGNOSIS — I872 Venous insufficiency (chronic) (peripheral): Secondary | ICD-10-CM | POA: Insufficient documentation

## 2014-03-23 DIAGNOSIS — I8310 Varicose veins of unspecified lower extremity with inflammation: Secondary | ICD-10-CM

## 2014-03-23 DIAGNOSIS — M7989 Other specified soft tissue disorders: Secondary | ICD-10-CM | POA: Insufficient documentation

## 2014-03-23 NOTE — Progress Notes (Signed)
   Subjective:    Patient ID: James Rivas, male    DOB: 04/20/1971, 43 y.o.   MRN: [id number]  HPI Patient states he has some swelling in his right lower leg. It has been present for about 1-2 weeks now. No pain noted. Patient has no other concerns at this time.  Compliant with bp and fluid pill  Swelling primarily right foot  Walks a lot with job[employer] History of hypertension. Compliant with medications. Tried watch salt intake. Next  Up on his feet a lot throughout the day. Has noted progressive swelling particularly in the right leg.      Review of Systems Skin changes. Darker pigmentation. Some scaliness. Occasional pruritus.  No chest pain no shortness breath no abdominal pain no change in bowel habits no blood in stools    Objective:   Physical Exam Alert HEENT normal. Vitals reviewed. Lungs clear. Heart regular in rhythm. Ankles 1-1/2+ edema right leg half to one plus edema left leg. Venous stasis changes. Pulses good. Sensation intact.       Assessment & Plan:  Impression 1 venous stasis dermatitis discussed. #2 hypertension good control. #3 morbid obesity contributing to problem. #4 venous stasis with progressive swelling. Doubt DVT plan right leg ultrasound. Bilateral toes 3. Recheck in month. Cut down salt intake. Regular exercise. Weight loss encouraged. WSL

## 2014-04-16 ENCOUNTER — Other Ambulatory Visit: Payer: Self-pay | Admitting: *Deleted

## 2014-04-16 MED ORDER — CHLORTHALIDONE 25 MG PO TABS: 25.0000 mg | ORAL_TABLET | Freq: Every day | ORAL | Status: DC

## 2014-04-23 ENCOUNTER — Ambulatory Visit (INDEPENDENT_AMBULATORY_CARE_PROVIDER_SITE_OTHER): Payer: BC Managed Care – PPO | Admitting: Family Medicine

## 2014-04-23 ENCOUNTER — Encounter: Payer: Self-pay | Admitting: Family Medicine

## 2014-04-23 VITALS — BP 138/88 | Ht 76.0 in | Wt 321.0 lb

## 2014-04-23 DIAGNOSIS — I872 Venous insufficiency (chronic) (peripheral): Secondary | ICD-10-CM

## 2014-04-23 DIAGNOSIS — R0609 Other forms of dyspnea: Secondary | ICD-10-CM

## 2014-04-23 DIAGNOSIS — R0989 Other specified symptoms and signs involving the circulatory and respiratory systems: Secondary | ICD-10-CM

## 2014-04-23 DIAGNOSIS — R0600 Dyspnea, unspecified: Secondary | ICD-10-CM

## 2014-04-23 DIAGNOSIS — I10 Essential (primary) hypertension: Secondary | ICD-10-CM

## 2014-04-23 DIAGNOSIS — I878 Other specified disorders of veins: Secondary | ICD-10-CM

## 2014-04-23 DIAGNOSIS — I8310 Varicose veins of unspecified lower extremity with inflammation: Secondary | ICD-10-CM

## 2014-04-23 NOTE — Progress Notes (Signed)
   Subjective:    Patient ID: James Rivas, male    DOB: 1971/02/13, 43 y.o.   MRN: [id number]  HPI Patient is here today for a follow up visit on right leg edema. Patient states that the edema has improved slightly. Patient states that he has no concerns at this time.   Notes a swelling has improved somewhat. Still a problem.  Had a ultrasound that was negative as far as DVT. He did reveal significant varicosities.  Patient claims full compliance with blood pressure medication. No obvious side effects.  Still having shortness of breath with exertion. No associated chest pain. On further history not exercising much.  Still don't challenges of morbid obesity. Patient has declined referral recommendations in this regard in the past. And still does. Review of Systems No chest pain no back pain no dyspnea no headache ROS otherwise negative    Objective:   Physical Exam  Alert. Vitals reviewed. Blood pressure 134/82 on repeat. no apparent distress. Lungs clear. Heart rare rhythm. Legs chronic venous stasis changes of the skin noted pulses good trace to 1+ edema bilateral      Assessment & Plan:  Impression chronic venous stasis discussed #2 hypertension good control. #3 dyspnea likely secondary to #4 and #5. #4 morbid obesity discussed. #5 poor exercise tolerance. plan maintain same meds. Add compression stockings. Start exercising. Try to lose weight. Rationale discussed. WSL

## 2014-04-28 ENCOUNTER — Encounter: Payer: Self-pay | Admitting: Family Medicine

## 2014-04-28 ENCOUNTER — Ambulatory Visit (INDEPENDENT_AMBULATORY_CARE_PROVIDER_SITE_OTHER): Payer: BC Managed Care – PPO | Admitting: Family Medicine

## 2014-04-28 VITALS — BP 130/80 | Ht 76.0 in | Wt 347.2 lb

## 2014-04-28 DIAGNOSIS — R079 Chest pain, unspecified: Secondary | ICD-10-CM

## 2014-04-28 MED ORDER — PREDNISONE 20 MG PO TABS: ORAL_TABLET | ORAL | Status: DC

## 2014-04-28 NOTE — Progress Notes (Signed)
   Subjective:    Patient ID: James Rivas, male    DOB: 06-25-1971, 43 y.o.   MRN: [id number]  HPI Patient arrives with complaint of chest pain with deep breath and movement since Sunday. Patient said it also bother him when he tries to lay down.  Wynelle Link eve started up, thought it might be indigestion  Muscle spasm intrermittently  Ibuprofen helped it some  Sternal, sharp pain  Worse wit dep breath  No pai with pressure to the chest   Increased actvivty in the days before this, practiced b ball Review of Systems No nausea no diaphoresis no exertional component no back pain    Objective:   Physical Exam  Alert no acute distress. Lungs clear heart rare rhythm. Chest wall no obvious tenderness. Abdomen benign.      Assessment & Plan:  Impression chest wall pain patient has had workup for this in the past all negative plan prednisone taper. Symptomatic care discussed. WSL

## 2014-05-28 ENCOUNTER — Other Ambulatory Visit: Payer: Self-pay | Admitting: Family Medicine

## 2014-08-05 ENCOUNTER — Ambulatory Visit (INDEPENDENT_AMBULATORY_CARE_PROVIDER_SITE_OTHER): Payer: BC Managed Care – PPO | Admitting: Family Medicine

## 2014-08-05 ENCOUNTER — Encounter: Payer: Self-pay | Admitting: Family Medicine

## 2014-08-05 VITALS — BP 122/70 | Temp 98.6°F | Ht 76.0 in | Wt 347.0 lb

## 2014-08-05 DIAGNOSIS — M5481 Occipital neuralgia: Secondary | ICD-10-CM

## 2014-08-05 DIAGNOSIS — M531 Cervicobrachial syndrome: Secondary | ICD-10-CM

## 2014-08-05 MED ORDER — NAPROXEN 500 MG PO TABS: 500.0000 mg | ORAL_TABLET | Freq: Two times a day (BID) | ORAL | Status: DC

## 2014-08-05 NOTE — Progress Notes (Signed)
   Subjective:    Patient ID: James Rivas, male    DOB: Oct 04, 1971, 43 y.o.   MRN: [id number]  Headache  This is a new problem. The current episode started 1 to 4 weeks ago. The problem occurs intermittently. The problem has been unchanged. Pain location: back of head. The pain does not radiate. The quality of the pain is described as aching. The pain is at a severity of 4/10. The pain is moderate. Nothing aggravates the symptoms. He has tried NSAIDs for the symptoms. The treatment provided no relief.   Patient states that he has no other concerns at this time.  Started about 1 week ago. Dull pain left posterior head occas into jaw region left side Does not awaken him No vomiting No fever, tried ibuprofen No prior hx of this  Review of Systems  Neurological: Positive for headaches.   Patient denies fevers sweats chills denies nausea denies unilateral numbness or weakness    Objective:   Physical Exam  Subjective discomfort in the left posterior part of his head neck is supple good range of motion neurologic grossly normal eardrums normal throat normal no masses are felt blood pressure was rechecked with large cuff of the      Assessment & Plan:  Musculoskeletal headache-this could be occipital neuralgia. It is in the left posterior occipital region. I discussed with him warning signs of what to watch for regarding severe headache if vomiting blurred vision fevers or severe pain followup immediately here or to ER. Otherwise use Naprosyn twice a day over the next several weeks to recheck with Dr. Viviann Spare a few weeks if ongoing troubles possibly will need cervical x-rays or possible MRI of the brain

## 2014-08-24 ENCOUNTER — Ambulatory Visit: Payer: BC Managed Care – PPO | Admitting: Family Medicine

## 2014-09-15 ENCOUNTER — Telehealth: Payer: Self-pay | Admitting: Family Medicine

## 2014-09-15 DIAGNOSIS — Z125 Encounter for screening for malignant neoplasm of prostate: Secondary | ICD-10-CM

## 2014-09-15 DIAGNOSIS — Z79899 Other long term (current) drug therapy: Secondary | ICD-10-CM

## 2014-09-15 DIAGNOSIS — E785 Hyperlipidemia, unspecified: Secondary | ICD-10-CM

## 2014-09-15 NOTE — Telephone Encounter (Signed)
Blood work ordered in Epic. Patient notified. 

## 2014-09-15 NOTE — Telephone Encounter (Signed)
Rep same 

## 2014-09-15 NOTE — Telephone Encounter (Signed)
Pt has phy 11/23 needs bw orders Please call when sent

## 2014-09-15 NOTE — Telephone Encounter (Signed)
Patient had Lipid, Liver, Psa and Met 7 on 09/2013

## 2014-09-26 ENCOUNTER — Other Ambulatory Visit: Payer: Self-pay | Admitting: Family Medicine

## 2014-09-27 LAB — HEPATIC FUNCTION PANEL
ALBUMIN: 4 g/dL (ref 3.5–5.2)
ALT: 19 U/L (ref 0–53)
AST: 22 U/L (ref 0–37)
Alkaline Phosphatase: 52 U/L (ref 39–117)
BILIRUBIN DIRECT: 0.1 mg/dL (ref 0.0–0.3)
Indirect Bilirubin: 0.4 mg/dL (ref 0.2–1.2)
Total Bilirubin: 0.5 mg/dL (ref 0.2–1.2)
Total Protein: 6.9 g/dL (ref 6.0–8.3)

## 2014-09-27 LAB — LIPID PANEL
CHOL/HDL RATIO: 4 ratio
CHOLESTEROL: 169 mg/dL (ref 0–200)
HDL: 42 mg/dL (ref >39)
LDL Cholesterol: 112 mg/dL — ABNORMAL HIGH (ref 0–99)
Triglycerides: 74 mg/dL (ref <150)
VLDL: 15 mg/dL (ref 0–40)

## 2014-09-27 LAB — BASIC METABOLIC PANEL
BUN: 14 mg/dL (ref 6–23)
CALCIUM: 9.2 mg/dL (ref 8.4–10.5)
CO2: 28 meq/L (ref 19–32)
Chloride: 102 mEq/L (ref 96–112)
Creat: 0.98 mg/dL (ref 0.50–1.35)
GLUCOSE: 96 mg/dL (ref 70–99)
POTASSIUM: 4.2 meq/L (ref 3.5–5.3)
SODIUM: 137 meq/L (ref 135–145)

## 2014-09-28 LAB — PSA: PSA: 0.18 ng/mL (ref <=4.00)

## 2014-10-20 ENCOUNTER — Encounter: Payer: Self-pay | Admitting: Family Medicine

## 2014-10-20 ENCOUNTER — Ambulatory Visit (INDEPENDENT_AMBULATORY_CARE_PROVIDER_SITE_OTHER): Payer: BC Managed Care – PPO | Admitting: Family Medicine

## 2014-10-20 VITALS — BP 134/82 | Ht 76.0 in | Wt 359.0 lb

## 2014-10-20 DIAGNOSIS — I8311 Varicose veins of right lower extremity with inflammation: Secondary | ICD-10-CM

## 2014-10-20 DIAGNOSIS — Z0000 Encounter for general adult medical examination without abnormal findings: Secondary | ICD-10-CM

## 2014-10-20 DIAGNOSIS — I10 Essential (primary) hypertension: Secondary | ICD-10-CM

## 2014-10-20 DIAGNOSIS — I872 Venous insufficiency (chronic) (peripheral): Secondary | ICD-10-CM

## 2014-10-20 DIAGNOSIS — I8312 Varicose veins of left lower extremity with inflammation: Secondary | ICD-10-CM

## 2014-10-20 MED ORDER — CHLORTHALIDONE 25 MG PO TABS: 25.0000 mg | ORAL_TABLET | Freq: Every day | ORAL | Status: DC

## 2014-10-20 MED ORDER — ENALAPRIL MALEATE 20 MG PO TABS: 20.0000 mg | ORAL_TABLET | Freq: Every day | ORAL | Status: DC
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
## 2015-01-06 ENCOUNTER — Ambulatory Visit (INDEPENDENT_AMBULATORY_CARE_PROVIDER_SITE_OTHER): Payer: BLUE CROSS/BLUE SHIELD | Admitting: Family Medicine

## 2015-01-06 ENCOUNTER — Encounter: Payer: Self-pay | Admitting: Family Medicine

## 2015-01-06 VITALS — BP 138/84 | Ht 76.0 in | Wt 377.0 lb

## 2015-01-06 DIAGNOSIS — J329 Chronic sinusitis, unspecified: Secondary | ICD-10-CM

## 2015-01-06 DIAGNOSIS — J309 Allergic rhinitis, unspecified: Secondary | ICD-10-CM

## 2015-01-06 DIAGNOSIS — N400 Enlarged prostate without lower urinary tract symptoms: Secondary | ICD-10-CM | POA: Insufficient documentation

## 2015-01-06 MED ORDER — TAMSULOSIN HCL 0.4 MG PO CAPS: 0.4000 mg | ORAL_CAPSULE | Freq: Every day | ORAL | Status: DC

## 2015-01-06 MED ORDER — AMOXICILLIN 500 MG PO CAPS: 500.0000 mg | ORAL_CAPSULE | Freq: Three times a day (TID) | ORAL | Status: DC

## 2015-01-06 MED ORDER — FLUTICASONE PROPIONATE 50 MCG/ACT NA SUSP: 2.0000 | Freq: Every day | NASAL | Status: DC

## 2015-01-06 NOTE — Progress Notes (Signed)
   Subjective:    Patient ID: James Rivas, male    DOB: 06/16/1971, 44 y.o.   MRN: [id number]  HPIHere today to discuss snoring.   Having progressive snoring  No energy  Not sleeping well. On further history patient is adamant that his snoring difficulties have really just appeared within recent weeks. History somewhat uncertain  Patient feels his allergies are acting up. Itchy eyes runny nose stuffy nose. This is what more congestion and patient feels more difficulty with snoring.    incr urination Both daytime and nighttime. Now getting up 2-3 times each night to urinate. This is been progressive over the past couple years. Patient relates this has been a contributor to his sleep disturbance.   Compliant with b p medfs no obvious side effects    Review of Systems No headache no fever no cough no sore throat no chest pain no back pain ROS otherwise negative    Objective:   Physical Exam Alert mild malaise. HEENT mild nasal congestion pharynx normal. Lungs clear. Heart regular in rhythm.       Assessment & Plan:  Impression 1 allergic rhinitis discussed #2 flare of snoring not enough symptomatology at this time to declare probable sleep apnea. Discussed at length #3 prostate hypertrophy merging past couple years. Recent prostate exam normal. Recent PSA normal. Plan trial of Flomax daily at bedtime 0.4 mg rationale discussed. Resume Flonase. Add antibiotic for potential sinusitis component. Recheck her persists. Warning signs discussed. WSL

## 2015-02-02 ENCOUNTER — Encounter: Payer: Self-pay | Admitting: Family Medicine

## 2015-02-02 ENCOUNTER — Ambulatory Visit (INDEPENDENT_AMBULATORY_CARE_PROVIDER_SITE_OTHER): Payer: BLUE CROSS/BLUE SHIELD | Admitting: Family Medicine

## 2015-02-02 VITALS — BP 140/88 | Temp 99.4°F | Ht 76.0 in | Wt 375.0 lb

## 2015-02-02 DIAGNOSIS — J329 Chronic sinusitis, unspecified: Secondary | ICD-10-CM | POA: Diagnosis not present

## 2015-02-02 MED ORDER — FLUTICASONE PROPIONATE 50 MCG/ACT NA SUSP: 2.0000 | Freq: Every day | NASAL | Status: DC

## 2015-02-02 MED ORDER — LORATADINE 10 MG PO TABS: 10.0000 mg | ORAL_TABLET | Freq: Every day | ORAL | Status: DC

## 2015-02-02 MED ORDER — AMOXICILLIN-POT CLAVULANATE 875-125 MG PO TABS: 1.0000 | ORAL_TABLET | Freq: Two times a day (BID) | ORAL | Status: DC

## 2015-02-02 NOTE — Progress Notes (Signed)
   Subjective:    Patient ID: Reuel Derbyric L Kiene, male    DOB: 07/30/1971, 44 y.o.   MRN: [id number]  HPIRecheck on snoring. Using humidifier at night. Helping some.    Having sinus drainage at night. Started last week. Drainage couple wks a lot of coughing and congestion and drainage.  normall has springtime allergies  Snoring substantial  Sleeps with snoring, turns over and experiences it   Six months ago was snoring some but not badly  A lot of temp changes with work and home and weather    Review of Systems No headache other than frontal. No chest pain no shortness of breath no abdominal pain    Objective:   Physical Exam Alert moderate malaise. Worsening morbid obesity H&T moderate his congestion frontal tenderness pharynx large soft palate lungs clear. Heart regular in rhythm.       Assessment & Plan:  Impression rhinosinusitis with component of allergic rhinitis discussed also potential element of sleep apnea. Complicated by worsening morbid obesity. Plan trial of antihistamine and steroid nasal spray. Along with antibiotics for infection. If snoring persists may well need a sleep study. 25 minutes spent most in discussion WSL

## 2015-02-02 NOTE — Patient Instructions (Signed)
Neuralgia paraesthitica

## 2015-05-29 ENCOUNTER — Other Ambulatory Visit: Payer: Self-pay | Admitting: Family Medicine

## 2015-06-01 MED ORDER — ENALAPRIL MALEATE 20 MG PO TABS: 20.0000 mg | ORAL_TABLET | Freq: Every day | ORAL | Status: DC

## 2015-06-01 NOTE — Addendum Note (Signed)
Addended by: Margaretha SheffieldBROWN, Willaim Mode S on: 06/01/2015 04:27 PM   Modules accepted: Orders

## 2015-06-28 ENCOUNTER — Other Ambulatory Visit: Payer: Self-pay | Admitting: Family Medicine

## 2015-07-09 ENCOUNTER — Ambulatory Visit (INDEPENDENT_AMBULATORY_CARE_PROVIDER_SITE_OTHER): Payer: BLUE CROSS/BLUE SHIELD | Admitting: Family Medicine

## 2015-07-09 ENCOUNTER — Encounter: Payer: Self-pay | Admitting: Family Medicine

## 2015-07-09 VITALS — BP 132/86 | Ht 76.0 in | Wt 375.4 lb

## 2015-07-09 DIAGNOSIS — M7581 Other shoulder lesions, right shoulder: Secondary | ICD-10-CM

## 2015-07-09 DIAGNOSIS — I10 Essential (primary) hypertension: Secondary | ICD-10-CM | POA: Diagnosis not present

## 2015-07-09 DIAGNOSIS — M7711 Lateral epicondylitis, right elbow: Secondary | ICD-10-CM

## 2015-07-09 MED ORDER — ENALAPRIL MALEATE 20 MG PO TABS: 20.0000 mg | ORAL_TABLET | Freq: Every day | ORAL | Status: DC

## 2015-07-09 MED ORDER — CHLORTHALIDONE 25 MG PO TABS: 25.0000 mg | ORAL_TABLET | Freq: Every day | ORAL | Status: DC

## 2015-07-09 MED ORDER — NAPROXEN 500 MG PO TABS: 500.0000 mg | ORAL_TABLET | Freq: Two times a day (BID) | ORAL | Status: DC

## 2015-07-09 NOTE — Progress Notes (Signed)
   Subjective:    Patient ID: James Rivas, male    DOB: 19-Sep-1971, 44 y.o.   MRN: [id number]  HPI  Patient arrives with c/o right shoulder pain for 3 days-pain woke him up in his sleep. Tried ibuprofen Helped some A little better today No numbeness or tingling No prior hx Some hx of occas right shoulder pain   Review of Systems  he denies any chest tightness pressure pain shortness of breath. He does relate pain in the trapezius on the right side as well as the right shoulder hurts with certain movements worse when he lays on it on the right side. He also relates some elbow pain when he tries to grab stop and let things.    Objective:   Physical Exam   there is some tenderness in the right elbow and right shoulder I don't find evidence of a rotator cuff tear but I do believe that there may be tendinitis very also has lateral epicondylitis. Lungs are clear hearts regular      Assessment & Plan:   hypertension-refills given on his medicine he should follow-up later this fall blood pressure is good hearts regular   right shoulder tendinitis I recommend range of motion exercises if not significantly better within the next 2 weeks referral to orthopedics   anti-inflammatory is prescribed if bothers stomach stop these also tennis elbow brace recommended

## 2015-07-28 ENCOUNTER — Ambulatory Visit (HOSPITAL_COMMUNITY)
Admission: RE | Admit: 2015-07-28 | Discharge: 2015-07-28 | Disposition: A | Discharge status: Home / Self Care | Payer: BLUE CROSS/BLUE SHIELD | Source: Ambulatory Visit | Attending: Family Medicine | Admitting: Family Medicine

## 2015-07-28 ENCOUNTER — Ambulatory Visit (INDEPENDENT_AMBULATORY_CARE_PROVIDER_SITE_OTHER): Payer: BLUE CROSS/BLUE SHIELD | Admitting: Family Medicine

## 2015-07-28 ENCOUNTER — Encounter: Payer: Self-pay | Admitting: Family Medicine

## 2015-07-28 VITALS — BP 130/80 | Temp 98.9°F | Ht 76.0 in | Wt 376.2 lb

## 2015-07-28 DIAGNOSIS — M25562 Pain in left knee: Secondary | ICD-10-CM | POA: Insufficient documentation

## 2015-07-28 MED ORDER — NABUMETONE 750 MG PO TABS: 750.0000 mg | ORAL_TABLET | Freq: Two times a day (BID) | ORAL | Status: DC | PRN

## 2015-07-28 NOTE — Progress Notes (Signed)
   Subjective:    Patient ID: James Rivas, male    DOB: 1971/04/08, 44 y.o.   MRN: [id number]  Knee Pain  The incident occurred more than 1 week ago. The incident occurred at home. There was no injury mechanism. The pain is present in the left knee. The quality of the pain is described as aching. The pain is at a severity of 7/10. The pain is moderate. The pain has been constant since onset. Associated symptoms include an inability to bear weight. He reports no foreign bodies present. The symptoms are aggravated by weight bearing. He has tried nothing for the symptoms. The treatment provided no relief.   Patient states that he has no other concerns at this time.   Buckles with cert mlotions  Recalls no sudden injury  Swollen last wk  Stiff at times, worse with incr motion    Generally knee has treated patient will and in the past. Feels that it is slight week swollen.  On further history he does recall stepped off a high surface and fell in some discomfort Review of Systems No hip pain no ankle pain no rash    Objective:   Physical Exam  Alert vitals stable. No acute distress. Lungs clear. Heart regular in rhythm. Left knee very large. No joint line tenderness. Slight effusion. No joint laxity.      Assessment & Plan:  Impression flare knee knee pain possible post injury plan x-ray knee. An inflammatory medicine prescribed. Expect gradual resolution of persists call and we will set up further investigation and/or referral [facility]

## 2015-09-27 ENCOUNTER — Telehealth: Payer: Self-pay | Admitting: Family Medicine

## 2015-09-27 DIAGNOSIS — Z79899 Other long term (current) drug therapy: Secondary | ICD-10-CM

## 2015-09-27 DIAGNOSIS — Z13220 Encounter for screening for lipoid disorders: Secondary | ICD-10-CM

## 2015-09-27 DIAGNOSIS — I10 Essential (primary) hypertension: Secondary | ICD-10-CM

## 2015-09-27 DIAGNOSIS — Z125 Encounter for screening for malignant neoplasm of prostate: Secondary | ICD-10-CM

## 2015-09-27 NOTE — Telephone Encounter (Signed)
bw orders please, he would like to have this done now an not  Wait for his PE in Dec   Last labs   09-26-14 Lip, BMP, Hep, PSA

## 2015-09-28 NOTE — Telephone Encounter (Signed)
Rep all same 

## 2015-09-28 NOTE — Telephone Encounter (Signed)
Orders put in. Pt notified on vm

## 2015-11-01 ENCOUNTER — Ambulatory Visit (INDEPENDENT_AMBULATORY_CARE_PROVIDER_SITE_OTHER): Payer: BLUE CROSS/BLUE SHIELD | Admitting: Family Medicine

## 2015-11-01 ENCOUNTER — Encounter: Payer: Self-pay | Admitting: Family Medicine

## 2015-11-01 VITALS — BP 130/84 | Ht 76.0 in | Wt 372.0 lb

## 2015-11-01 DIAGNOSIS — I10 Essential (primary) hypertension: Secondary | ICD-10-CM

## 2015-11-01 DIAGNOSIS — Z0000 Encounter for general adult medical examination without abnormal findings: Secondary | ICD-10-CM | POA: Diagnosis not present

## 2015-11-01 MED ORDER — TAMSULOSIN HCL 0.4 MG PO CAPS: 0.4000 mg | ORAL_CAPSULE | Freq: Every day | ORAL | Status: DC

## 2015-11-01 MED ORDER — CHLORTHALIDONE 25 MG PO TABS: 25.0000 mg | ORAL_TABLET | Freq: Every day | ORAL | Status: DC

## 2015-11-01 MED ORDER — ENALAPRIL MALEATE 20 MG PO TABS: 20.0000 mg | ORAL_TABLET | Freq: Every day | ORAL | Status: DC

## 2015-11-01 NOTE — Progress Notes (Signed)
Subjective:    Patient ID: James Rivas, male    DOB: 12/24/1970, 44 y.o.   MRN: [id number]  HPI The patient comes in today for a wellness visit. Goes to y a bit  Lot of walking at work  , stays active  BP meds compliant, cks bp some, occas with parti time nurse  A review of their health history was completed.  A review of medications was also completed.  Any needed refills; Yes  Eating habits: Patient states eating habits are fair.   Falls/  MVA accidents in past few months: None  Regular exercise: No  Specialist pt sees on regular basis: None  Preventative health issues were discussed.   Additional concerns: Patient states no concerns this visit.  Results for orders placed or performed in visit on 09/26/14  Basic metabolic panel  Result Value Ref Range   Sodium 137 135 - 145 mEq/L   Potassium 4.2 3.5 - 5.3 mEq/L   Chloride 102 96 - 112 mEq/L   CO2 28 19 - 32 mEq/L   Glucose, Bld 96 70 - 99 mg/dL   BUN 14 6 - 23 mg/dL   Creat 7.820.98 9.560.50 - 2.131.35 mg/dL   Calcium 9.2 8.4 - 08.610.5 mg/dL  Lipid panel  Result Value Ref Range   Cholesterol 169 0 - 200 mg/dL   Triglycerides 74 <578<150 mg/dL   HDL 42 >46>39 mg/dL   Total CHOL/HDL Ratio 4.0 Ratio   VLDL 15 0 - 40 mg/dL   LDL Cholesterol 962112 (H) 0 - 99 mg/dL  Hepatic function panel  Result Value Ref Range   Total Bilirubin 0.5 0.2 - 1.2 mg/dL   Bilirubin, Direct 0.1 0.0 - 0.3 mg/dL   Indirect Bilirubin 0.4 0.2 - 1.2 mg/dL   Alkaline Phosphatase 52 39 - 117 U/L   AST 22 0 - 37 U/L   ALT 19 0 - 53 U/L   Total Protein 6.9 6.0 - 8.3 g/dL   Albumin 4.0 3.5 - 5.2 g/dL  PSA  Result Value Ref Range   PSA 0.18 <=4.00 ng/mL   Joints overall decent left nknee aches at times   Review of Systems  Constitutional: Negative for fever, activity change and appetite change.       Ongoing challenges with excess weight  HENT: Negative for congestion and rhinorrhea.   Eyes: Negative for discharge.  Respiratory: Negative for cough and  wheezing.   Cardiovascular: Negative for chest pain.  Gastrointestinal: Negative for vomiting, abdominal pain and blood in stool.  Genitourinary: Negative for frequency and difficulty urinating.  Musculoskeletal: Negative for neck pain.  Skin: Negative for rash.  Allergic/Immunologic: Negative for environmental allergies and food allergies.  Neurological: Negative for weakness and headaches.  Psychiatric/Behavioral: Negative for agitation.       Objective:   Physical Exam  Constitutional: He appears well-developed and well-nourished.  Morbid obesity present  HENT:  Head: Normocephalic and atraumatic.  Right Ear: External ear normal.  Left Ear: External ear normal.  Nose: Nose normal.  Mouth/Throat: Oropharynx is clear and moist.  Eyes: EOM are normal. Pupils are equal, round, and reactive to light.  Neck: Normal range of motion. Neck supple. No thyromegaly present.  Cardiovascular: Normal rate, regular rhythm and normal heart sounds.   No murmur heard. Pulmonary/Chest: Effort normal and breath sounds normal. No respiratory distress. He has no wheezes.  Abdominal: Soft. Bowel sounds are normal. He exhibits no distension and no mass. There is no tenderness.  Genitourinary: Penis  normal.  Musculoskeletal: Normal range of motion. He exhibits no edema.  Lymphadenopathy:    He has no cervical adenopathy.  Neurological: He is alert. He exhibits normal muscle tone.  Skin: Skin is warm and dry. No erythema.  Psychiatric: He has a normal mood and affect. His behavior is normal. Judgment normal.  Vitals reviewed.  Blood pressure excellent on repeat       Assessment & Plan:  Impression 1 wellness exam #2 hypertension good control with current meds meds reviewed to maintain same treatment #3 morbid obesity discussed at length. Time to consider bariatric referral. Educational information given. Patient advised to consider this strongly plan appropriate blood work medicines refilled.  Further recommendations based results W[facility]

## 2015-11-01 NOTE — Patient Instructions (Signed)
Bariatric Surgery Information Bariatric surgery, also called weight loss surgery, is a procedure that helps you lose weight. You may consider or your health care provider may suggest bariatric surgery if:  You are severely obese and have been unable to lose weight through diet and exercise.  You have health problems related to obesity, such as:  Type 2 diabetes.  Heart disease.  Lung disease. HOW DOES BARIATRIC SURGERY HELP ME LOSE WEIGHT?  Bariatric surgery helps you lose weight by decreasing how much food your body absorbs. This is done by closing off part of your stomach to make it smaller. This restricts the amount of food your stomach can hold. Bariatric surgery can also change your body's regular digestive process, so that food bypasses the parts of your body that absorb calories and nutrients.  If you decide to have bariatric surgery, it is important to continue to eat a healthy diet and exercise regularly after the surgery.  WHAT ARE THE DIFFERENT KINDS OF BARIATRIC SURGERY?  There are two kinds of bariatric surgeries:  Restrictive surgeries make your stomach smaller. They do not change your digestive process. The smaller the size of your new stomach, the less food you can eat. There are different types of restrictive surgeries.  Malabsorptive surgeries both make your stomach smaller and alter your digestive process so that your body processes less calories and nutrients. These are the most common kind of bariatric surgery. There are different types of malabsorptive surgeries. WHAT ARE THE DIFFERENT TYPES OF RESTRICTIVE SURGERY? Adjustable Gastric Banding In this procedure, an inflatable band is placed around your stomach near the upper end. This makes the passageway for food into the rest of your stomach much smaller. The band can be adjusted, making it tighter or looser, by filling it with salt solution. Your surgeon can adjust the band based on how are you feeling and how much  weight you are losing. The band can be removed in the future.  Vertical Banded Gastroplasty In this procedure, staples are used to separate your stomach into two parts, a small upper pouch and a bigger lower pouch. This decreases how much food you can eat. Sleeve Gastrectomy In this procedure, your stomach is made smaller. This is done by surgically removing a large part of your stomach. When your stomach is smaller, you feel full more quickly and reduce how much you eat. WHAT ARE THE DIFFERENT TYPES OF MALABSORPTIVE SURGERY? Roux-en-Y Gastric Bypass (RGB) This is the most common weight loss surgery. In this procedure, a small stomach pouch is created in the upper part of your stomach. Next, this small stomach pouch is attached directly to the middle part of your small intestine. The farther down your small intestine the new connection is made, the fewer calories and nutrients you will absorb.  Biliopancreatic Diversion with Duodenal Switch (BPD/DS)  This is a multi-step procedure. In this procedure, a large part of your stomach is removed, making your stomach smaller. Next, this smaller stomach is attached to the lower part of your small intestine. Like the RGB surgery, you absorb fewer calories and nutrients the farther down your small intestine the attachment is made.   WHAT ARE THE RISKS OF BARIATRIC SURGERY? As with any surgical procedure, each type of bariatric surgery has its own risks. These risks also depend on your age, your overall health, and any other medical conditions you may have. When deciding on bariatric surgery, it is very important to:   Talk to your health care provider   and choose the surgery that is best for you.  Ask your health care provider about specific risks for the surgery you choose. FOR MORE INFORMATION  American Society for Metabolic & Bariatric Surgery: www.asmbs.org  Weight-control Information Network (WIN): win.niddk.nih.gov   This information is not  intended to replace advice given to you by your health care provider. Make sure you discuss any questions you have with your health care provider.   Document Released: 11/13/2005 Document Revised: 08/04/2015 Document Reviewed: 05/14/2013 Elsevier Interactive Patient Education 2016 Elsevier Inc.  

## 2015-11-08 LAB — BASIC METABOLIC PANEL
BUN/Creatinine Ratio: 17 (ref 9–20)
BUN: 15 mg/dL (ref 6–24)
CALCIUM: 9.2 mg/dL (ref 8.7–10.2)
CHLORIDE: 99 mmol/L (ref 97–106)
CO2: 25 mmol/L (ref 18–29)
Creatinine, Ser: 0.9 mg/dL (ref 0.76–1.27)
GFR calc Af Amer: 119 mL/min/{1.73_m2} (ref >59)
GFR calc non Af Amer: 103 mL/min/{1.73_m2} (ref >59)
GLUCOSE: 112 mg/dL — AB (ref 65–99)
Potassium: 4.7 mmol/L (ref 3.5–5.2)
Sodium: 139 mmol/L (ref 136–144)

## 2015-11-08 LAB — LIPID PANEL
Chol/HDL Ratio: 3.8 ratio units (ref 0.0–5.0)
Cholesterol, Total: 155 mg/dL (ref 100–199)
HDL: 41 mg/dL (ref >39)
LDL Calculated: 102 mg/dL — ABNORMAL HIGH (ref 0–99)
TRIGLYCERIDES: 58 mg/dL (ref 0–149)
VLDL Cholesterol Cal: 12 mg/dL (ref 5–40)

## 2015-11-08 LAB — HEPATIC FUNCTION PANEL
ALT: 18 IU/L (ref 0–44)
AST: 21 IU/L (ref 0–40)
Albumin: 4.2 g/dL (ref 3.5–5.5)
Alkaline Phosphatase: 72 IU/L (ref 39–117)
BILIRUBIN TOTAL: 0.4 mg/dL (ref 0.0–1.2)
BILIRUBIN, DIRECT: 0.11 mg/dL (ref 0.00–0.40)
Total Protein: 7.1 g/dL (ref 6.0–8.5)

## 2015-11-08 LAB — PSA: Prostate Specific Ag, Serum: 0.2 ng/mL (ref 0.0–4.0)

## 2015-11-11 ENCOUNTER — Encounter: Payer: Self-pay | Admitting: Family Medicine

## 2016-01-19 ENCOUNTER — Encounter: Payer: Self-pay | Admitting: Family Medicine

## 2016-01-19 ENCOUNTER — Ambulatory Visit (INDEPENDENT_AMBULATORY_CARE_PROVIDER_SITE_OTHER): Payer: BLUE CROSS/BLUE SHIELD | Admitting: Family Medicine

## 2016-01-19 VITALS — BP 128/82 | Temp 99.9°F | Ht 76.0 in | Wt 377.0 lb

## 2016-01-19 DIAGNOSIS — J019 Acute sinusitis, unspecified: Secondary | ICD-10-CM | POA: Diagnosis not present

## 2016-01-19 DIAGNOSIS — J111 Influenza due to unidentified influenza virus with other respiratory manifestations: Secondary | ICD-10-CM

## 2016-01-19 DIAGNOSIS — J200 Acute bronchitis due to Mycoplasma pneumoniae: Secondary | ICD-10-CM

## 2016-01-19 MED ORDER — AZITHROMYCIN 250 MG PO TABS: ORAL_TABLET | ORAL | Status: DC

## 2016-01-19 MED ORDER — CEFTRIAXONE SODIUM 1 G IJ SOLR
500.0000 mg | Freq: Once | INTRAMUSCULAR | Status: AC
  Administered 2016-01-19: 500 mg via INTRAMUSCULAR

## 2016-01-19 NOTE — Progress Notes (Signed)
   Subjective:    Patient ID: James Rivas, male    DOB: 1971-05-22, 45 y.o.   MRN: [id number]  Fever  This is a new problem. The current episode started 1 to 4 weeks ago. The problem occurs intermittently. The problem has been unchanged. Associated symptoms include chest pain, coughing, headaches and muscle aches. Treatments tried: mucinex. The treatment provided no relief.    patient states he was sick last week with what seemed to be a bad cold it got better for a few days and then over the past 2 days is had some body aches fevers chills congestion coughing not feeling good low energy.   Review of Systems  Constitutional: Positive for fever.  Respiratory: Positive for cough.   Cardiovascular: Positive for chest pain.  Neurological: Positive for headaches.       Objective:   Physical Exam  patient makes good eye contact not rest or distress throat is normal eardrums normal neck no masses lungs are clear no crackles heart regular cough noted       Assessment & Plan:   I suspect this patient had a viral illness last week and has some secondary infection going on currently I believe it is more likely a sinus infection given his fever and chills and this being a secondary infection we will go ahead with shot of antibiotics and antibiotic pills in addition to this I cannot exclude the possibility of a moderate case of the flu along with this illness he is to rest up over the next few days. Follow-up if progressive troubles. Warning signs were discussed in detail.

## 2016-01-19 NOTE — Patient Instructions (Signed)

## 2016-01-28 DIAGNOSIS — Z029 Encounter for administrative examinations, unspecified: Secondary | ICD-10-CM

## 2016-04-28 ENCOUNTER — Encounter: Payer: Self-pay | Admitting: Family Medicine

## 2016-04-28 ENCOUNTER — Ambulatory Visit (INDEPENDENT_AMBULATORY_CARE_PROVIDER_SITE_OTHER): Payer: BLUE CROSS/BLUE SHIELD | Admitting: Family Medicine

## 2016-04-28 VITALS — BP 138/88 | Wt 373.6 lb

## 2016-04-28 DIAGNOSIS — M25562 Pain in left knee: Secondary | ICD-10-CM | POA: Diagnosis not present

## 2016-04-28 MED ORDER — HYDROCODONE-ACETAMINOPHEN 5-325 MG PO TABS: 1.0000 | ORAL_TABLET | Freq: Four times a day (QID) | ORAL | Status: DC | PRN

## 2016-04-28 MED ORDER — PREDNISONE 20 MG PO TABS: ORAL_TABLET | ORAL | Status: DC

## 2016-04-28 NOTE — Progress Notes (Signed)
   Subjective:    Patient ID: James Rivas, male    DOB: 09/13/1971, 45 y.o.   MRN: [id number]  HPI  Patient arrives with c/o left knee pain for 7-10 days.atient is been working substantially over time. Coof medial knee pain.Worse when standing. Un his job requires is nearly continuously.  Also hurts: Up steps. Getting out of vehicle.  Pain is worse in the medial portion. Exactly where it occurred during last flare.  On further history patient does have remote knee injury back in school and playing football  Sig swelling and stiffness  Review of Systems No headache, no major weight loss or weight gain, no chest pain no back pain abdominal pain no change in bowel habits complete ROS otherwise negative     Objective:   Physical Exam Alert no apparent distress lungs clear. Heart regular in rhythm. Medial knee tender to palpation no obvious effusion. Psych crepitations       Assessment & Plan:  Impression flare of chronic knee pain with likely medial meniscus coti-inflammatoork excuse givencare discussed  Gradual improvement. If persists may need to see orthopedicWSL

## 2016-06-24 ENCOUNTER — Other Ambulatory Visit: Payer: Self-pay | Admitting: Family Medicine

## 2016-07-27 ENCOUNTER — Other Ambulatory Visit: Payer: Self-pay | Admitting: Family Medicine

## 2016-08-13 ENCOUNTER — Other Ambulatory Visit: Payer: Self-pay | Admitting: Family Medicine

## 2016-08-25 ENCOUNTER — Other Ambulatory Visit: Payer: Self-pay | Admitting: Family Medicine

## 2016-08-30 ENCOUNTER — Telehealth: Payer: Self-pay | Admitting: Family Medicine

## 2016-08-30 ENCOUNTER — Other Ambulatory Visit: Payer: Self-pay | Admitting: *Deleted

## 2016-08-30 MED ORDER — CHLORTHALIDONE 25 MG PO TABS: 25.0000 mg | ORAL_TABLET | Freq: Every day | ORAL | 0 refills | Status: DC

## 2016-08-30 MED ORDER — ENALAPRIL MALEATE 20 MG PO TABS: 20.0000 mg | ORAL_TABLET | Freq: Every day | ORAL | 0 refills | Status: DC

## 2016-08-30 NOTE — Telephone Encounter (Signed)
Pt is needing refills on  enalapril (VASOTEC) 20 MG tablet chlorthalidone (HYGROTON) 25 MG tablet  [facility] [place]

## 2016-08-30 NOTE — Telephone Encounter (Signed)
Pt last seen in dec for a med check. 30 day supply only sent to pharm. Pt transferred to front to schedule office visit within the next 30 days

## 2016-09-15 ENCOUNTER — Ambulatory Visit: Payer: BLUE CROSS/BLUE SHIELD | Admitting: Family Medicine

## 2016-10-13 ENCOUNTER — Ambulatory Visit (INDEPENDENT_AMBULATORY_CARE_PROVIDER_SITE_OTHER): Payer: BLUE CROSS/BLUE SHIELD | Admitting: Family Medicine

## 2016-10-13 ENCOUNTER — Encounter: Payer: Self-pay | Admitting: Family Medicine

## 2016-10-13 VITALS — BP 138/86 | Ht 76.0 in | Wt 379.4 lb

## 2016-10-13 DIAGNOSIS — N400 Enlarged prostate without lower urinary tract symptoms: Secondary | ICD-10-CM | POA: Diagnosis not present

## 2016-10-13 DIAGNOSIS — Z125 Encounter for screening for malignant neoplasm of prostate: Secondary | ICD-10-CM

## 2016-10-13 DIAGNOSIS — J301 Allergic rhinitis due to pollen: Secondary | ICD-10-CM

## 2016-10-13 DIAGNOSIS — I10 Essential (primary) hypertension: Secondary | ICD-10-CM

## 2016-10-13 DIAGNOSIS — Z13220 Encounter for screening for lipoid disorders: Secondary | ICD-10-CM | POA: Diagnosis not present

## 2016-10-13 MED ORDER — ENALAPRIL MALEATE 20 MG PO TABS: 20.0000 mg | ORAL_TABLET | Freq: Every day | ORAL | 5 refills | Status: DC

## 2016-10-13 NOTE — Progress Notes (Signed)
   Subjective:    Patient ID: James Rivas, male    DOB: 07/13/1971, 45 y.o.   MRN: [id number]  Hypertension  This is a chronic problem. The current episode started more than 1 year ago. Risk factors for coronary artery disease include male gender and obesity. Treatments tried: enalapril,chlorthalidone.    bp meds faithfulll , Generally does not miss a dose. Blood pressure overall looking good when checked elsewhere.  Blood pressure medicine and blood pressure levels reviewed today with patient. Compliant with blood pressure medicine. States does not miss a dose. No obvious side effects. Blood pressure generally good when checked elsewhere. Watching salt intake.  Patient still struggling with obesity. Has fairly severe morbid obesity. Reluctant to consider surgical or procedural intervention.  Reports she has stopped using the Flomax. Still getting up about once per night to urinate.  Allergies generally under control. Uses her Flonase regularly  A lot of walking at work   No flu shot  Watching salt not overdoing it, usues garlic ''    Review of Systems No headache, no major weight loss or weight gain, no chest pain no back pain abdominal pain no change in bowel habits complete ROS otherwise negative     Objective:   Physical Exam  Alert vitals stable, NAD. Blood pressure good on repeat. HEENT Mild baseline congestion otherwise normal. Lungs clear. Heart regular rate and rhythm. Morbid obesity present ankles trace edema some venous stasis changes evident.      Assessment & Plan:  Impression 1 hypertension on multiple meds overall good control discussed time discussed #2 chronic allergic rhinitis. Response to medications maintain same #3 morbid obesity discussed at great length. Patient still reluctant to consider further intervention despite minimal improvement with his own efforts. #4 prostate hypertrophy ongoing. Patient elected to stop Flomax plan appropriate blood work. Diet  exercise discussed. Medications refilled. Recheck in 6 months. For wellness plus chronic further recommendations based on lab results

## 2016-11-17 DIAGNOSIS — Z13220 Encounter for screening for lipoid disorders: Secondary | ICD-10-CM | POA: Diagnosis not present

## 2016-11-17 DIAGNOSIS — I10 Essential (primary) hypertension: Secondary | ICD-10-CM | POA: Diagnosis not present

## 2016-11-17 DIAGNOSIS — Z125 Encounter for screening for malignant neoplasm of prostate: Secondary | ICD-10-CM | POA: Diagnosis not present

## 2016-11-18 LAB — BASIC METABOLIC PANEL
BUN / CREAT RATIO: 16 (ref 9–20)
BUN: 17 mg/dL (ref 6–24)
CO2: 25 mmol/L (ref 18–29)
CREATININE: 1.05 mg/dL (ref 0.76–1.27)
Calcium: 9.5 mg/dL (ref 8.7–10.2)
Chloride: 99 mmol/L (ref 96–106)
GFR calc Af Amer: 98 mL/min/{1.73_m2} (ref >59)
GFR, EST NON AFRICAN AMERICAN: 85 mL/min/{1.73_m2} (ref >59)
GLUCOSE: 98 mg/dL (ref 65–99)
Potassium: 4.2 mmol/L (ref 3.5–5.2)
Sodium: 140 mmol/L (ref 134–144)

## 2016-11-18 LAB — HEPATIC FUNCTION PANEL
ALBUMIN: 4.7 g/dL (ref 3.5–5.5)
ALT: 30 IU/L (ref 0–44)
AST: 30 IU/L (ref 0–40)
Alkaline Phosphatase: 71 IU/L (ref 39–117)
BILIRUBIN TOTAL: 0.5 mg/dL (ref 0.0–1.2)
Bilirubin, Direct: 0.14 mg/dL (ref 0.00–0.40)
TOTAL PROTEIN: 8.2 g/dL (ref 6.0–8.5)

## 2016-11-18 LAB — LIPID PANEL
CHOL/HDL RATIO: 3.8 ratio (ref 0.0–5.0)
Cholesterol, Total: 175 mg/dL (ref 100–199)
HDL: 46 mg/dL (ref >39)
LDL CALC: 113 mg/dL — AB (ref 0–99)
Triglycerides: 78 mg/dL (ref 0–149)
VLDL Cholesterol Cal: 16 mg/dL (ref 5–40)

## 2016-11-18 LAB — PSA: Prostate Specific Ag, Serum: 0.2 ng/mL (ref 0.0–4.0)

## 2016-11-22 ENCOUNTER — Ambulatory Visit (INDEPENDENT_AMBULATORY_CARE_PROVIDER_SITE_OTHER): Payer: BLUE CROSS/BLUE SHIELD | Admitting: Family Medicine

## 2016-11-22 ENCOUNTER — Encounter: Payer: Self-pay | Admitting: Family Medicine

## 2016-11-22 VITALS — BP 134/90 | Ht 76.5 in | Wt 384.2 lb

## 2016-11-22 DIAGNOSIS — Z0000 Encounter for general adult medical examination without abnormal findings: Secondary | ICD-10-CM

## 2016-11-22 NOTE — Progress Notes (Signed)
Subjective:    Patient ID: James Rivas, male    DOB: 05/06/1971, 45 y.o.   MRN: [id number]  HPI The patient comes in today for a wellness visit.  Results for orders placed or performed in visit on 10/13/16  Basic metabolic panel  Result Value Ref Range   Glucose 98 65 - 99 mg/dL   BUN 17 6 - 24 mg/dL   Creatinine, Ser 4.091.05 0.76 - 1.27 mg/dL   GFR calc non Af Amer 85 >59 mL/min/1.73   GFR calc Af Amer 98 >59 mL/min/1.73   BUN/Creatinine Ratio 16 9 - 20   Sodium 140 134 - 144 mmol/L   Potassium 4.2 3.5 - 5.2 mmol/L   Chloride 99 96 - 106 mmol/L   CO2 25 18 - 29 mmol/L   Calcium 9.5 8.7 - 10.2 mg/dL  Lipid panel  Result Value Ref Range   Cholesterol, Total 175 100 - 199 mg/dL   Triglycerides 78 0 - 149 mg/dL   HDL 46 >81>39 mg/dL   VLDL Cholesterol Cal 16 5 - 40 mg/dL   LDL Calculated 191113 (H) 0 - 99 mg/dL   Chol/HDL Ratio 3.8 0.0 - 5.0 ratio units  Hepatic function panel  Result Value Ref Range   Total Protein 8.2 6.0 - 8.5 g/dL   Albumin 4.7 3.5 - 5.5 g/dL   Bilirubin Total 0.5 0.0 - 1.2 mg/dL   Bilirubin, Direct 4.780.14 0.00 - 0.40 mg/dL   Alkaline Phosphatase 71 39 - 117 IU/L   AST 30 0 - 40 IU/L   ALT 30 0 - 44 IU/L  PSA  Result Value Ref Range   Prostate Specific Ag, Serum 0.2 0.0 - 4.0 ng/mL    Exercise overall doing well , but not recently  No coln ca no prost cancer  Obesity runs in fathr's side of the family  A review of their health history was completed.  A review of medications was also completed.  Any needed refills: none  Eating habits: good  Falls/  MVA accidents in past few months: none  Regular exercise: none  Specialist pt sees on regular basis: none  Preventative health issues were discussed.   Additional concerns: none    Review of Systems  Constitutional: Negative for activity change, appetite change and fever.  HENT: Negative for congestion and rhinorrhea.   Eyes: Negative for discharge.  Respiratory: Negative for cough and  wheezing.   Cardiovascular: Negative for chest pain.  Gastrointestinal: Negative for abdominal pain, blood in stool and vomiting.  Genitourinary: Negative for difficulty urinating and frequency.  Musculoskeletal: Negative for neck pain.  Skin: Negative for rash.  Allergic/Immunologic: Negative for environmental allergies and food allergies.  Neurological: Negative for weakness and headaches.  Psychiatric/Behavioral: Negative for agitation.  All other systems reviewed and are negative.      Objective:   Physical Exam  Constitutional: He appears well-developed and well-nourished.  Substantial morbid obesity bmi 46   HENT:  Head: Normocephalic and atraumatic.  Right Ear: External ear normal.  Left Ear: External ear normal.  Nose: Nose normal.  Mouth/Throat: Oropharynx is clear and moist.  Eyes: EOM are normal. Pupils are equal, round, and reactive to light.  Neck: Normal range of motion. Neck supple. No thyromegaly present.  Cardiovascular: Normal rate, regular rhythm and normal heart sounds.   No murmur heard. Pulmonary/Chest: Effort normal and breath sounds normal. No respiratory distress. He has no wheezes.  Abdominal: Soft. Bowel sounds are normal. He exhibits no distension  and no mass. There is no tenderness.  Genitourinary: Penis normal.  Musculoskeletal: Normal range of motion. He exhibits no edema.  Lymphadenopathy:    He has no cervical adenopathy.  Neurological: He is alert. He exhibits normal muscle tone.  Skin: Skin is warm and dry. No erythema.  Venous stasis changes p hyperpig  Psychiatric: He has a normal mood and affect. His behavior is normal. Judgment normal.  Vitals reviewed.         Assessment & Plan:  Impression wellness exam #2 morbid obesity long discussion held. Patient was in trouble here he is try but he is not able to lose weight #3 venous stasis bilateral stockings prescribed plan prior blood work reviewed diet exercise discussed flu shot  declinedp plan bariatric referral. Patient agrees with this

## 2016-11-23 ENCOUNTER — Encounter: Payer: Self-pay | Admitting: Family Medicine

## 2016-12-14 ENCOUNTER — Other Ambulatory Visit: Payer: Self-pay | Admitting: Family Medicine

## 2017-03-16 ENCOUNTER — Ambulatory Visit (INDEPENDENT_AMBULATORY_CARE_PROVIDER_SITE_OTHER): Payer: BLUE CROSS/BLUE SHIELD | Admitting: Family Medicine

## 2017-03-16 ENCOUNTER — Encounter: Payer: Self-pay | Admitting: Family Medicine

## 2017-03-16 VITALS — BP 128/84 | Temp 100.3°F | Ht 77.0 in | Wt 396.0 lb

## 2017-03-16 DIAGNOSIS — J329 Chronic sinusitis, unspecified: Secondary | ICD-10-CM

## 2017-03-16 DIAGNOSIS — J019 Acute sinusitis, unspecified: Secondary | ICD-10-CM | POA: Diagnosis not present

## 2017-03-16 MED ORDER — METHYLPREDNISOLONE ACETATE 40 MG/ML IJ SUSP
40.0000 mg | Freq: Once | INTRAMUSCULAR | Status: AC
  Administered 2017-03-16: 40 mg via INTRAMUSCULAR

## 2017-03-16 MED ORDER — AMOXICILLIN-POT CLAVULANATE 875-125 MG PO TABS: ORAL_TABLET | ORAL | 0 refills | Status: DC

## 2017-03-16 NOTE — Progress Notes (Signed)
   Subjective:    Patient ID: James Rivas, male    DOB: [birth year], 46 y.o.   MRN: [id number]  Sinusitis  This is a new problem. Episode onset: one week. Associated symptoms include congestion, coughing and headaches. Treatments tried: nasal spray.    Felt achey and bad last night  headche frontal  Normally take s caritin or zyrtec prn for symtoms  Gets to coughing and five min later coughing again  Review of Systems  HENT: Positive for congestion.   Respiratory: Positive for cough.   Neurological: Positive for headaches.       Objective:   Physical Exam Alert, mild malaise. Hydration good Vitals stable. frontal/ maxillary tenderness evident positive nasal congestion. pharynx normal neck supple  lungs clear/no crackles or wheezes. heart regular in rhythm        Assessment & Plan:  Impression rhinosinusitis likely post viral, discussed with patient. plan antibiotics prescribed. Questions answered. Symptomatic care discussed. warning signs discussed. [facility]SL

## 2017-04-13 ENCOUNTER — Other Ambulatory Visit: Payer: Self-pay | Admitting: Family Medicine

## 2017-05-22 ENCOUNTER — Telehealth: Payer: Self-pay | Admitting: Family Medicine

## 2017-05-22 NOTE — Telephone Encounter (Signed)
Patient is requesting a letter to excuse him to go to the bathroom at work.  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  His human resources has advised him to get a letter from his PCP to allow him to use the bathroom at his convenience closer to him.

## 2017-05-23 ENCOUNTER — Ambulatory Visit: Payer: BLUE CROSS/BLUE SHIELD | Admitting: Family Medicine

## 2017-06-04 ENCOUNTER — Encounter: Payer: Self-pay | Admitting: Family Medicine

## 2017-06-04 ENCOUNTER — Ambulatory Visit (INDEPENDENT_AMBULATORY_CARE_PROVIDER_SITE_OTHER): Payer: BLUE CROSS/BLUE SHIELD | Admitting: Family Medicine

## 2017-06-04 DIAGNOSIS — I878 Other specified disorders of veins: Secondary | ICD-10-CM

## 2017-06-04 DIAGNOSIS — I10 Essential (primary) hypertension: Secondary | ICD-10-CM

## 2017-06-04 MED ORDER — ENALAPRIL MALEATE 20 MG PO TABS: 20.0000 mg | ORAL_TABLET | Freq: Every day | ORAL | 5 refills | Status: DC

## 2017-06-04 NOTE — Progress Notes (Signed)
   Subjective:    Patient ID: James Rivas, male    DOB: 07/19/1971, 46 y.o.   MRN: [id number]  Hypertension  This is a chronic problem. The current episode started more than 1 year ago. The problem has been gradually improving since onset. The problem is controlled. Risk factors for coronary artery disease include obesity.  Pt states he exercises and diets when he can.  Blood pressure medicine and blood pressure levels reviewed today with patient. Compliant with blood pressure medicine. States does not miss a dose. No obvious side effects. Blood pressure generally good when checked elsewhere. Watching salt intake. Patient arrives office with several concerns  Ongoing challenges with morbid obesity. In fact is gaining 35 pounds in the last 3 and half years. Now he is within a few pounds 400. There is family history of obesity next  Ongoing challenges with swelling in legs. Worse after a full day at work. Gives achiness in the legs. Needs a new prescription for venous stasis.   Has started skipping meals  Still  No other concerns.  Review of Systems No headache, no major weight loss or weight gain, no chest pain no back pain abdominal pain no change in bowel habits complete ROS otherwise negative     Objective:   Physical Exam Alert and oriented, vitals reviewed and stable, NAD ENT-TM's and ext canals WNL bilat via otoscopic exam Soft palate, tonsils and post pharynx WNL via oropharyngeal exam Neck-symmetric, no masses; thyroid nonpalpable and nontender Pulmonary-no tachypnea or accessory muscle use; Clear without wheezes via auscultation Card--no abnrml murmurs, rhythm reg and rate WNL Carotid pulses symmetric, without bruits Substantial morbid obesity present in both legs significant venous stasis skin changes evident with 1+ edema bilateral       Assessment & Plan:  Impression 1 morbid obesity discussed at great length time for referral to bariatric surgeons. We've tried this  in the past patient now agrees #2 hypertension good control discussed maintain same meds #3 venous stasis ongoing with need to resume proper compression stockings rationale discussed. Medications refilled referral initiated diet exercise discussed recheck in 6 months for wellness plus chronic

## 2017-06-12 ENCOUNTER — Encounter: Payer: Self-pay | Admitting: Family Medicine

## 2017-10-09 ENCOUNTER — Other Ambulatory Visit: Payer: Self-pay | Admitting: Family Medicine

## 2017-11-29 ENCOUNTER — Encounter: Payer: Self-pay | Admitting: Family Medicine

## 2017-11-29 ENCOUNTER — Ambulatory Visit (INDEPENDENT_AMBULATORY_CARE_PROVIDER_SITE_OTHER): Payer: BLUE CROSS/BLUE SHIELD | Admitting: Family Medicine

## 2017-11-29 VITALS — BP 126/88 | Ht 77.0 in | Wt 391.0 lb

## 2017-11-29 DIAGNOSIS — Z13220 Encounter for screening for lipoid disorders: Secondary | ICD-10-CM | POA: Diagnosis not present

## 2017-11-29 DIAGNOSIS — Z0000 Encounter for general adult medical examination without abnormal findings: Secondary | ICD-10-CM | POA: Diagnosis not present

## 2017-11-29 DIAGNOSIS — I10 Essential (primary) hypertension: Secondary | ICD-10-CM

## 2017-11-29 DIAGNOSIS — Z125 Encounter for screening for malignant neoplasm of prostate: Secondary | ICD-10-CM | POA: Diagnosis not present

## 2017-11-29 DIAGNOSIS — N400 Enlarged prostate without lower urinary tract symptoms: Secondary | ICD-10-CM | POA: Diagnosis not present

## 2017-11-29 MED ORDER — CHLORTHALIDONE 25 MG PO TABS: 25.0000 mg | ORAL_TABLET | Freq: Every day | ORAL | 5 refills | Status: DC

## 2017-11-29 MED ORDER — ENALAPRIL MALEATE 20 MG PO TABS: 20.0000 mg | ORAL_TABLET | Freq: Every day | ORAL | 5 refills | Status: DC

## 2017-11-29 NOTE — Progress Notes (Signed)
Subjective:    Patient ID: James Rivas, male    DOB: 01/01/1971, 47 y.o.   MRN: [id number]  HPI The patient comes in today for a wellness visit.    A review of their health history was completed.  A review of medications was also completed.  Any needed refills; none  Eating habits: health conscious  Falls/  MVA accidents in past few months: none  Regular exercise: [employer] 2 -3 times per week. Treadmill and lifts weights exerfisisn gthree to four d per wk    Tries to stay active  Results for orders placed or performed in visit on 10/13/16  Basic metabolic panel  Result Value Ref Range   Glucose 98 65 - 99 mg/dL   BUN 17 6 - 24 mg/dL   Creatinine, Ser 4.091.05 0.76 - 1.27 mg/dL   GFR calc non Af Amer 85 >59 mL/min/1.73   GFR calc Af Amer 98 >59 mL/min/1.73   BUN/Creatinine Ratio 16 9 - 20   Sodium 140 134 - 144 mmol/L   Potassium 4.2 3.5 - 5.2 mmol/L   Chloride 99 96 - 106 mmol/L   CO2 25 18 - 29 mmol/L   Calcium 9.5 8.7 - 10.2 mg/dL  Lipid panel  Result Value Ref Range   Cholesterol, Total 175 100 - 199 mg/dL   Triglycerides 78 0 - 149 mg/dL   HDL 46 >81>39 mg/dL   VLDL Cholesterol Cal 16 5 - 40 mg/dL   LDL Calculated 191113 (H) 0 - 99 mg/dL   Chol/HDL Ratio 3.8 0.0 - 5.0 ratio units  Hepatic function panel  Result Value Ref Range   Total Protein 8.2 6.0 - 8.5 g/dL   Albumin 4.7 3.5 - 5.5 g/dL   Bilirubin Total 0.5 0.0 - 1.2 mg/dL   Bilirubin, Direct 4.780.14 0.00 - 0.40 mg/dL   Alkaline Phosphatase 71 39 - 117 IU/L   AST 30 0 - 40 IU/L   ALT 30 0 - 44 IU/L  PSA  Result Value Ref Range   Prostate Specific Ag, Serum 0.2 0.0 - 4.0 ng/mL   Prost a no coonc  Specialist pt sees on regular basis: none  Preventative health issues were discussed.   Additional concerns: none  Pt declines flu vaccine.   Blood pressure medicine and blood pressure levels reviewed today with patient. Compliant with blood pressure medicine. States does not miss a dose. No obvious side effects.  Blood pressure generally good when checked elsewhere. Watching salt intake.   Watching sal tintak e  Review of Systems  Constitutional: Negative for activity change, appetite change and fever.  HENT: Negative for congestion and rhinorrhea.   Eyes: Negative for discharge.  Respiratory: Negative for cough and wheezing.   Cardiovascular: Negative for chest pain.  Gastrointestinal: Negative for abdominal pain, blood in stool and vomiting.  Genitourinary: Negative for difficulty urinating and frequency.  Musculoskeletal: Negative for neck pain.  Skin: Negative for rash.  Allergic/Immunologic: Negative for environmental allergies and food allergies.  Neurological: Negative for weakness and headaches.  Psychiatric/Behavioral: Negative for agitation.  All other systems reviewed and are negative.      Objective:   Physical Exam  Constitutional: He appears well-developed and well-nourished.  HENT:  Head: Normocephalic and atraumatic.  Right Ear: External ear normal.  Left Ear: External ear normal.  Nose: Nose normal.  Mouth/Throat: Oropharynx is clear and moist.  Eyes: EOM are normal. Pupils are equal, round, and reactive to light.  Neck: Normal range of motion.  Neck supple. No thyromegaly present.  Cardiovascular: Normal rate, regular rhythm and normal heart sounds.  No murmur heard. Pulmonary/Chest: Effort normal and breath sounds normal. No respiratory distress. He has no wheezes.  Abdominal: Soft. Bowel sounds are normal. He exhibits no distension and no mass. There is no tenderness.  Genitourinary: Penis normal.  Musculoskeletal: Normal range of motion. He exhibits no edema.  Lymphadenopathy:    He has no cervical adenopathy.  Neurological: He is alert. He exhibits normal muscle tone.  Skin: Skin is warm and dry. No erythema.  Psychiatric: He has a normal mood and affect. His behavior is normal. Judgment normal.  Vitals reviewed.         Assessment & Plan:  Wellness.  morb obesity. Saw intake person this summer. Did not f u this summer.pt reluctant to undergo more surg  htn good control, disc, to maintain same meds   Morbid obesity , dis, reencouraged to get up with obiesity specialist  appropri b w

## 2017-12-06 ENCOUNTER — Ambulatory Visit (INDEPENDENT_AMBULATORY_CARE_PROVIDER_SITE_OTHER): Payer: BLUE CROSS/BLUE SHIELD | Admitting: Family Medicine

## 2017-12-06 ENCOUNTER — Encounter: Payer: Self-pay | Admitting: Family Medicine

## 2017-12-06 VITALS — BP 130/90 | Temp 98.1°F | Ht 77.0 in | Wt 390.2 lb

## 2017-12-06 DIAGNOSIS — J329 Chronic sinusitis, unspecified: Secondary | ICD-10-CM | POA: Diagnosis not present

## 2017-12-06 MED ORDER — HYDROCODONE-HOMATROPINE 5-1.5 MG/5ML PO SYRP: 5.0000 mL | ORAL_SOLUTION | Freq: Every evening | ORAL | 0 refills | Status: DC | PRN

## 2017-12-06 MED ORDER — AMOXICILLIN-POT CLAVULANATE 875-125 MG PO TABS: 1.0000 | ORAL_TABLET | Freq: Two times a day (BID) | ORAL | 0 refills | Status: DC

## 2017-12-06 NOTE — Progress Notes (Signed)
   Subjective:    Patient ID: James Rivas, male    DOB: 12/08/1970, 47 y.o.   MRN: [id number]  Cough  This is a new problem. The current episode started in the past 7 days. Associated symptoms include nasal congestion. Treatments tried: otc meds.   Started last week  Coughing and spitting up phlegm  No fever or chills  headacje  None  Definitely in the chest, energy o k    Review of Systems  Respiratory: Positive for cough.    No vomiting or diarrhea    Objective:   Physical Exam  Alert, mild malaise. Hydration good Vitals stable. frontal/ maxillary tenderness evident positive nasal congestion. pharynx normal neck supple  lungs clear/no crackles or wheezes. heart regular in rhythm      Assessment & Plan:  Impression rhinosinusitis likely post viral, discussed with patient. plan antibiotics prescribed. Questions answered. Symptomatic care discussed. warning signs discussed. WSL

## 2017-12-08 DIAGNOSIS — Z0000 Encounter for general adult medical examination without abnormal findings: Secondary | ICD-10-CM | POA: Diagnosis not present

## 2017-12-08 DIAGNOSIS — I10 Essential (primary) hypertension: Secondary | ICD-10-CM | POA: Diagnosis not present

## 2017-12-08 DIAGNOSIS — Z125 Encounter for screening for malignant neoplasm of prostate: Secondary | ICD-10-CM | POA: Diagnosis not present

## 2017-12-08 DIAGNOSIS — Z13220 Encounter for screening for lipoid disorders: Secondary | ICD-10-CM | POA: Diagnosis not present

## 2017-12-10 ENCOUNTER — Encounter: Payer: Self-pay | Admitting: Family Medicine

## 2017-12-10 LAB — BASIC METABOLIC PANEL
BUN / CREAT RATIO: 14 (ref 9–20)
BUN: 14 mg/dL (ref 6–24)
CHLORIDE: 101 mmol/L (ref 96–106)
CO2: 27 mmol/L (ref 20–29)
Calcium: 9.5 mg/dL (ref 8.7–10.2)
Creatinine, Ser: 0.98 mg/dL (ref 0.76–1.27)
GFR calc Af Amer: 105 mL/min/{1.73_m2} (ref >59)
GFR calc non Af Amer: 91 mL/min/{1.73_m2} (ref >59)
GLUCOSE: 122 mg/dL — AB (ref 65–99)
Potassium: 4.9 mmol/L (ref 3.5–5.2)
SODIUM: 141 mmol/L (ref 134–144)

## 2017-12-10 LAB — HEPATIC FUNCTION PANEL
ALK PHOS: 66 IU/L (ref 39–117)
ALT: 45 IU/L — AB (ref 0–44)
AST: 38 IU/L (ref 0–40)
Albumin: 4.3 g/dL (ref 3.5–5.5)
BILIRUBIN, DIRECT: 0.14 mg/dL (ref 0.00–0.40)
Bilirubin Total: 0.5 mg/dL (ref 0.0–1.2)
Total Protein: 7.4 g/dL (ref 6.0–8.5)

## 2017-12-10 LAB — LIPID PANEL
CHOL/HDL RATIO: 3.7 ratio (ref 0.0–5.0)
Cholesterol, Total: 142 mg/dL (ref 100–199)
HDL: 38 mg/dL — AB (ref >39)
LDL Calculated: 88 mg/dL (ref 0–99)
Triglycerides: 79 mg/dL (ref 0–149)
VLDL CHOLESTEROL CAL: 16 mg/dL (ref 5–40)

## 2017-12-10 LAB — PSA: PROSTATE SPECIFIC AG, SERUM: 0.2 ng/mL (ref 0.0–4.0)

## 2018-03-07 ENCOUNTER — Encounter: Payer: Self-pay | Admitting: Family Medicine

## 2018-03-07 ENCOUNTER — Ambulatory Visit: Payer: BLUE CROSS/BLUE SHIELD | Admitting: Family Medicine

## 2018-03-07 VITALS — BP 142/94 | Ht 77.0 in | Wt 376.0 lb

## 2018-03-07 DIAGNOSIS — M545 Low back pain, unspecified: Secondary | ICD-10-CM

## 2018-03-07 MED ORDER — ETODOLAC 400 MG PO TABS: 400.0000 mg | ORAL_TABLET | Freq: Two times a day (BID) | ORAL | 1 refills | Status: DC

## 2018-03-07 MED ORDER — CHLORZOXAZONE 500 MG PO TABS: 500.0000 mg | ORAL_TABLET | Freq: Three times a day (TID) | ORAL | 1 refills | Status: DC | PRN

## 2018-03-07 NOTE — Patient Instructions (Signed)

## 2018-03-07 NOTE — Progress Notes (Signed)
   Subjective:    Patient ID: James Rivas, male    DOB: 06/13/1971, 47 y.o.   MRN: [id number]  Back Pain  This is a new problem. Episode onset: 1 -2 weeks. The pain is present in the lumbar spine. The symptoms are aggravated by position, bending and twisting. Treatments tried: ibuprofen.   Exercising freq at the y  Lifting weight s now  Maybe injured it there    Any certain moves grabs sudden   Pos right lumbar region  Stays mainly in low lumbar region  Takes ibu qo d     No lifting with the job , currently       Review of Systems  Musculoskeletal: Positive for back pain.       Objective:   Physical Exam Alert active good hydration.  HEENT normal.  Lungs clear.  Heart regular rate and rhythm.  Negative straight leg raise.  Positive distinct pain right lumbar region.  No spinal tenderness or pain       Assessment & Plan:  Impression subacute lumbar strain with element of spasm.  Plan anti-inflammatory medicine prescribed.  Muscle spasm medicine prescribed.  Local measures discussed/long-term exercise discussed

## 2018-04-08 ENCOUNTER — Other Ambulatory Visit: Payer: Self-pay | Admitting: Family Medicine

## 2018-05-27 ENCOUNTER — Encounter (HOSPITAL_COMMUNITY): Payer: Self-pay | Admitting: Emergency Medicine

## 2018-05-27 ENCOUNTER — Other Ambulatory Visit: Payer: Self-pay

## 2018-05-27 ENCOUNTER — Emergency Department (HOSPITAL_COMMUNITY)
Admission: EM | Admit: 2018-05-27 | Discharge: 2018-05-27 | Disposition: A | Discharge status: Home / Self Care | Payer: BLUE CROSS/BLUE SHIELD | Attending: Emergency Medicine | Admitting: Emergency Medicine

## 2018-05-27 DIAGNOSIS — I10 Essential (primary) hypertension: Secondary | ICD-10-CM | POA: Diagnosis not present

## 2018-05-27 DIAGNOSIS — I83891 Varicose veins of right lower extremities with other complications: Secondary | ICD-10-CM | POA: Insufficient documentation

## 2018-05-27 DIAGNOSIS — Z79899 Other long term (current) drug therapy: Secondary | ICD-10-CM | POA: Diagnosis not present

## 2018-05-27 DIAGNOSIS — R58 Hemorrhage, not elsewhere classified: Secondary | ICD-10-CM | POA: Diagnosis not present

## 2018-05-27 DIAGNOSIS — I8390 Asymptomatic varicose veins of unspecified lower extremity: Secondary | ICD-10-CM

## 2018-05-27 DIAGNOSIS — R198 Other specified symptoms and signs involving the digestive system and abdomen: Secondary | ICD-10-CM | POA: Diagnosis not present

## 2018-05-27 DIAGNOSIS — I83899 Varicose veins of unspecified lower extremities with other complications: Secondary | ICD-10-CM

## 2018-05-27 MED ORDER — FUROSEMIDE 40 MG PO TABS: 40.0000 mg | ORAL_TABLET | Freq: Once | ORAL | Status: DC

## 2018-05-27 MED ORDER — FUROSEMIDE 40 MG PO TABS: 20.0000 mg | ORAL_TABLET | Freq: Every day | ORAL | 0 refills | Status: DC

## 2018-05-27 NOTE — ED Triage Notes (Signed)
Pt in shower and noticed blood coming from right lower leg. States got it to stop bleeding but now right foot is numb. No bleeding at this time. ble swelling and shiny appearance noted. Pt states this is chronic

## 2018-05-27 NOTE — ED Provider Notes (Signed)
[facility] EMERGENCY DEPARTMENT Provider Note   CSN: [id number] Arrival date & time: 05/27/18  1807     History   Chief Complaint Chief Complaint  Patient presents with  . Leg Pain    HPI TEJON GRACIE is a 47 y.o. male.  HPI 47 year old male reports he had bleeding from the medial aspect of his right leg in a pinpoint area that began tonight in the shower.  Grabs it as squirting.  He put pressure on it it has since resolved.  There is some tingling and pain in the area at the time.  This is improved. Past Medical History:  Diagnosis Date  . Allergy   . Chest pain   . HTN (hypertension)   . IFG (impaired fasting glucose)   . Impaired fasting glucose   . Morbid obesity (HCC)   . Venous stasis     Patient Active Problem List   Diagnosis Date Noted  . Morbid obesity (HCC) 10/15/2016  . Allergic rhinitis 01/06/2015  . Prostate hypertrophy 01/06/2015  . Venous stasis 03/23/2014  . Venous stasis dermatitis 03/23/2014  . Chronic chest pain 06/22/2013  . Dyspnea 05/18/2013  . Chest pain   . HTN (hypertension)     Past Surgical History:  Procedure Laterality Date  . FINGER SURGERY Left    left ring finger 2 surgeries  . I&D EXTREMITY  11/29/2011   Procedure: IRRIGATION AND DEBRIDEMENT EXTREMITY;  Surgeon: Sharma Covert;  Location: [facility] OR;  Service: Orthopedics;  Laterality: Left;  I&D Right Long and Index Fingers with Tendon Repair as Needed        Home Medications    Prior to Admission medications   Medication Sig Start Date End Date Taking? Authorizing Provider  amoxicillin-clavulanate (AUGMENTIN) 875-125 MG tablet Take 1 tablet by mouth 2 (two) times daily. Patient not taking: Reported on 03/07/2018 12/06/17   Merlyn Albert, MD  chlorthalidone (HYGROTON) 25 MG tablet Take 1 tablet (25 mg total) by mouth daily. 11/29/17   Merlyn Albert, MD  chlorzoxazone (PARAFON) 500 MG tablet Take 1 tablet (500 mg total) by mouth 3 (three) times daily as needed for muscle  spasms. 03/07/18   Merlyn Albert, MD  enalapril (VASOTEC) 20 MG tablet TAKE 1 TABLET BY MOUTH ONCE DAILY 04/09/18   Merlyn Albert, MD  etodolac (LODINE) 400 MG tablet Take 1 tablet (400 mg total) by mouth 2 (two) times daily. 03/07/18   Merlyn Albert, MD  HYDROcodone-homatropine [facility]) 5-1.5 MG/5ML syrup Take 5 mLs by mouth at bedtime as needed for cough. 12/06/17   Merlyn Albert, MD    Family History Family History  Problem Relation Age of Onset  . Diabetes Father   . Diabetes Paternal Aunt     Social History Social History   Tobacco Use  . Smoking status: Never Smoker  . Smokeless tobacco: Never Used  Substance Use Topics  . Alcohol use: Yes    Comment: occ  . Drug use: No     Allergies   Patient has no known allergies.   Review of Systems Review of Systems   Physical Exam Updated Vital Signs BP (!) 153/84 (BP Location: Right Arm)   Pulse 92   Temp 98.4 F (36.9 C) (Oral)   Resp 20   SpO2 96%   Physical Exam  Constitutional: He is oriented to person, place, and time. He appears well-developed and well-nourished.  HENT:  Head: Normocephalic and atraumatic.  Eyes: Pupils are equal,  round, and reactive to light.  Neck: Normal range of motion. Neck supple.  Cardiovascular: Normal rate and regular rhythm.  Pulmonary/Chest: Effort normal and breath sounds normal.  Abdominal: Soft.  Musculoskeletal: Normal range of motion. He exhibits edema.  Use bilateral varicosities No bleeding is noted No site of previous bleeding noted Dorsal t pedalis pulses intact. Trace edema noted  Neurological: He is alert and oriented to person, place, and time.  Skin: Skin is warm. Capillary refill takes less than 2 seconds.  Psychiatric: He has a normal mood and affect.  Nursing note and vitals reviewed.    ED Treatments / Results  Labs (all labs ordered are listed, but only abnormal results are displayed) Labs Reviewed - No data to  display  EKG None  Radiology No results found.  Procedures Procedures (including critical care time)  Medications Ordered in ED Medications - No data to display   Initial Impression / Assessment and Plan / ED Course  I have reviewed the triage vital signs and the nursing notes.  Pertinent labs & imaging results that were available during my care of the patient were reviewed by me and considered in my medical decision making (see chart for details).     Patient has history consistent with bleeding from varicosity.  This is resolved prior to my evaluation.  We discussed Lasix and compression.  He will keep leg elevated.  He has a follow-up with Dr. Gerda Dissluking this week.  He is on his feet for 12-hour shifts.  He is given a work note to be off until follow-up with Dr. Gerda Dissluking.  Final Clinical Impressions(s) / ED Diagnoses   Final diagnoses:  Ruptured varicosity    ED Discharge Orders    None       Margarita Grizzleay, Ronita Hargreaves, MD 05/29/18 1442

## 2018-05-27 NOTE — Discharge Instructions (Addendum)
Keep leg elevated and use compression stockings- use ace wrap until you are able to get compression hose. Recheck with Dr. Gerda DissLuking as scheduled

## 2018-05-29 ENCOUNTER — Encounter: Payer: Self-pay | Admitting: Family Medicine

## 2018-05-29 ENCOUNTER — Ambulatory Visit: Payer: BLUE CROSS/BLUE SHIELD | Admitting: Family Medicine

## 2018-05-29 VITALS — BP 130/84 | Ht 77.0 in | Wt 370.0 lb

## 2018-05-29 DIAGNOSIS — I10 Essential (primary) hypertension: Secondary | ICD-10-CM

## 2018-05-29 DIAGNOSIS — I878 Other specified disorders of veins: Secondary | ICD-10-CM

## 2018-05-29 DIAGNOSIS — I872 Venous insufficiency (chronic) (peripheral): Secondary | ICD-10-CM

## 2018-05-29 MED ORDER — ENALAPRIL MALEATE 20 MG PO TABS: 20.0000 mg | ORAL_TABLET | Freq: Every day | ORAL | 5 refills | Status: DC

## 2018-05-29 MED ORDER — CHLORTHALIDONE 25 MG PO TABS: 25.0000 mg | ORAL_TABLET | Freq: Every day | ORAL | 5 refills | Status: DC

## 2018-05-29 NOTE — Progress Notes (Signed)
   Subjective:    Patient ID: James Rivas, male    DOB: 03/31/1971, 47 y.o.   MRN: [id number] Patient arrives with numerous concerns Patient is here today to follow up on Htn. He states he tries to eat healthy and exercise.He does not see any specialist. He is taking Enalapril 20 mg daily, and furosemide 40 mg 1/2 daily,and Chlorthalidone 25 mg daily.  Blood pressure medicine and blood pressure levels reviewed today with patient. Compliant with blood pressure medicine. States does not miss a dose. No obvious side effects. Blood pressure generally good when checked elsewhere. Watching salt intake.  Patient has known morbid obesity.  Bariatric referral has been discussed in the past.  Discussed once again.  Patient is decided to hold off on it for now.  Working on his own.  More concerned about recent visit to the emergency room   Recent ER visit, had bleeding vaicosity.  Was advised to use Lasix.  Was advised to wear in a stocking and pressure dressing.  Reports one further episode of bleeding since then.  Recalls no injury does tend to not take any aspirin    Has mprb id obsity   No headache, no major weight loss or weight gain, no chest pain no back pain abdominal pain no change in bowel habits complete ROS otherwise negative   Alert and oriented, vitals reviewed and stable, NAD ENT-TM's and ext canals WNL bilat via otoscopic exam Soft palate, tonsils and post pharynx WNL via oropharyngeal exam Neck-symmetric, no masses; thyroid nonpalpable and nontender Pulmonary-no tachypnea or accessory muscle use; Clear without wheezes via auscultation Card--no abnrml murmurs, rhythm reg and rate WNL Carotid pulses symmetric, without bruits Morbid obesity present legs positive hemosiderin staining.  1+ edema.  Impressive varicosities bilateral dressing on right leg pinpoint hemorrhage site no infection   Impression 1 Hypertension good control discussed maintain same meds compliance discussed  2.   Venous stasis discussed at length.  Would recommend pressure stockings during the day rationale discussed 30 mm stockings prescribed.  Morbid obesity discussed at length patient wishes to hold off on bariatric referral  Greater than 50% of this 25 minute face to face visit was spent in counseling and discussion and coordination of care regarding the above diagnosis/diagnosies    Follow-up for physical and visit in 6 months

## 2018-06-14 ENCOUNTER — Emergency Department (HOSPITAL_COMMUNITY): Payer: BLUE CROSS/BLUE SHIELD

## 2018-06-14 ENCOUNTER — Telehealth: Payer: Self-pay | Admitting: Family Medicine

## 2018-06-14 ENCOUNTER — Other Ambulatory Visit: Payer: Self-pay

## 2018-06-14 ENCOUNTER — Encounter (HOSPITAL_COMMUNITY): Payer: Self-pay | Admitting: *Deleted

## 2018-06-14 ENCOUNTER — Emergency Department (HOSPITAL_COMMUNITY)
Admission: EM | Admit: 2018-06-14 | Discharge: 2018-06-14 | Disposition: A | Discharge status: Home / Self Care | Payer: BLUE CROSS/BLUE SHIELD | Attending: Emergency Medicine | Admitting: Emergency Medicine

## 2018-06-14 DIAGNOSIS — R0600 Dyspnea, unspecified: Secondary | ICD-10-CM

## 2018-06-14 DIAGNOSIS — J40 Bronchitis, not specified as acute or chronic: Secondary | ICD-10-CM | POA: Insufficient documentation

## 2018-06-14 DIAGNOSIS — I10 Essential (primary) hypertension: Secondary | ICD-10-CM | POA: Diagnosis not present

## 2018-06-14 DIAGNOSIS — R079 Chest pain, unspecified: Secondary | ICD-10-CM | POA: Diagnosis not present

## 2018-06-14 DIAGNOSIS — Z79899 Other long term (current) drug therapy: Secondary | ICD-10-CM | POA: Insufficient documentation

## 2018-06-14 DIAGNOSIS — R0602 Shortness of breath: Secondary | ICD-10-CM | POA: Diagnosis not present

## 2018-06-14 LAB — BASIC METABOLIC PANEL
ANION GAP: 7 (ref 5–15)
BUN: 16 mg/dL (ref 6–20)
CALCIUM: 9.3 mg/dL (ref 8.9–10.3)
CO2: 29 mmol/L (ref 22–32)
CREATININE: 1.06 mg/dL (ref 0.61–1.24)
Chloride: 102 mmol/L (ref 98–111)
GFR calc Af Amer: >60 mL/min (ref >60)
Glucose, Bld: 98 mg/dL (ref 70–99)
Potassium: 3.9 mmol/L (ref 3.5–5.1)
Sodium: 138 mmol/L (ref 135–145)

## 2018-06-14 LAB — CBC
HCT: 45.4 % (ref 39.0–52.0)
HEMOGLOBIN: 15.2 g/dL (ref 13.0–17.0)
MCH: 28.7 pg (ref 26.0–34.0)
MCHC: 33.5 g/dL (ref 30.0–36.0)
MCV: 85.8 fL (ref 78.0–100.0)
PLATELETS: 203 10*3/uL (ref 150–400)
RBC: 5.29 MIL/uL (ref 4.22–5.81)
RDW: 14.9 % (ref 11.5–15.5)
WBC: 10.5 10*3/uL (ref 4.0–10.5)

## 2018-06-14 LAB — BRAIN NATRIURETIC PEPTIDE: B NATRIURETIC PEPTIDE 5: 26 pg/mL (ref 0.0–100.0)

## 2018-06-14 LAB — TROPONIN I

## 2018-06-14 LAB — D-DIMER, QUANTITATIVE: D-Dimer, Quant: 0.47 ug/mL-FEU (ref 0.00–0.50)

## 2018-06-14 MED ORDER — ALBUTEROL SULFATE HFA 108 (90 BASE) MCG/ACT IN AERS
1.0000 | INHALATION_SPRAY | RESPIRATORY_TRACT | Status: DC | PRN
  Administered 2018-06-14: 2 via RESPIRATORY_TRACT
  Filled 2018-06-14: qty 6.7

## 2018-06-14 MED ORDER — PREDNISONE 50 MG PO TABS
60.0000 mg | ORAL_TABLET | Freq: Once | ORAL | Status: AC
  Administered 2018-06-14: 60 mg via ORAL
  Filled 2018-06-14: qty 1

## 2018-06-14 MED ORDER — PREDNISONE 20 MG PO TABS: ORAL_TABLET | ORAL | 0 refills | Status: DC

## 2018-06-14 NOTE — Telephone Encounter (Signed)
Patient informed he needed to be evaluated at the Ed.

## 2018-06-14 NOTE — Telephone Encounter (Signed)
I called and asked that he r/c. 

## 2018-06-14 NOTE — ED Notes (Signed)
Lab is processing BNP as well as D dimer

## 2018-06-14 NOTE — Telephone Encounter (Signed)
Please see nurse triage With his symptomatology and risk factors it is not wise for him to wait till next week He needs to go to ER for further evaluation

## 2018-06-14 NOTE — ED Notes (Signed)
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

## 2018-06-14 NOTE — ED Provider Notes (Signed)
[facility] EMERGENCY DEPARTMENT Provider Note   CSN: [id number] Arrival date & time: 06/14/18  1514     History   Chief Complaint Chief Complaint  Patient presents with  . Chest Pain    HPI James Rivas is a 47 y.o. male.  HPI Patient presents with 1 week of shortness of breath.  He is had mild nonproductive cough.  He specifically denies any chest pain.  Complains of generalized weakness.  He has had bilateral lower extremity swelling which he states is chronic.  Denies any fever or chills.  States his shortness of breath is not worse with lying flat or exertion.  He does have intermittent palpitations associated with this.  Currently denies any symptoms. Past Medical History:  Diagnosis Date  . Allergy   . Chest pain   . HTN (hypertension)   . IFG (impaired fasting glucose)   . Impaired fasting glucose   . Morbid obesity (HCC)   . Venous stasis     Patient Active Problem List   Diagnosis Date Noted  . Morbid obesity (HCC) 10/15/2016  . Allergic rhinitis 01/06/2015  . Prostate hypertrophy 01/06/2015  . Venous stasis 03/23/2014  . Venous stasis dermatitis 03/23/2014  . Chronic chest pain 06/22/2013  . Dyspnea 05/18/2013  . Chest pain   . HTN (hypertension)     Past Surgical History:  Procedure Laterality Date  . FINGER SURGERY Left    left ring finger 2 surgeries  . I&D EXTREMITY  11/29/2011   Procedure: IRRIGATION AND DEBRIDEMENT EXTREMITY;  Surgeon: Sharma Covert;  Location: [facility] OR;  Service: Orthopedics;  Laterality: Left;  I&D Right Long and Index Fingers with Tendon Repair as Needed        Home Medications    Prior to Admission medications   Medication Sig Start Date End Date Taking? Authorizing Provider  chlorthalidone (HYGROTON) 25 MG tablet Take 1 tablet (25 mg total) by mouth daily. 05/29/18  Yes Merlyn Albert, MD  enalapril (VASOTEC) 20 MG tablet Take 1 tablet (20 mg total) by mouth daily. 05/29/18  Yes Merlyn Albert, MD  predniSONE  (DELTASONE) 20 MG tablet 3 tabs po day one, then 2 po daily x 4 days 06/15/18   Loren Racer, MD    Family History Family History  Problem Relation Age of Onset  . Diabetes Father   . Diabetes Paternal Aunt     Social History Social History   Tobacco Use  . Smoking status: Never Smoker  . Smokeless tobacco: Never Used  Substance Use Topics  . Alcohol use: Yes    Comment: occ  . Drug use: No     Allergies   Patient has no known allergies.   Review of Systems Review of Systems  Constitutional: Negative for chills and fever.  HENT: Negative for trouble swallowing.   Eyes: Negative for visual disturbance.  Respiratory: Positive for cough and shortness of breath.   Cardiovascular: Positive for palpitations and leg swelling. Negative for chest pain.  Gastrointestinal: Negative for abdominal pain, constipation, diarrhea, nausea and vomiting.  Genitourinary: Negative for dysuria, flank pain and frequency.  Musculoskeletal: Negative for back pain and myalgias.  Skin: Negative for rash and wound.  Neurological: Negative for dizziness, weakness, light-headedness, numbness and headaches.  All other systems reviewed and are negative.    Physical Exam Updated Vital Signs BP 123/66   Pulse 70   Temp 99.2 F (37.3 C) (Oral)   Resp 13   Ht 6\' 5"  (1.956  m)   Wt (!) 158.8 kg (350 lb)   SpO2 95%   BMI 41.50 kg/m   Physical Exam  Constitutional: He is oriented to person, place, and time. He appears well-developed and well-nourished. No distress.  HENT:  Head: Normocephalic and atraumatic.  Mouth/Throat: Oropharynx is clear and moist. No oropharyngeal exudate.  Eyes: Pupils are equal, round, and reactive to light. EOM are normal.  Neck: Normal range of motion. Neck supple. No JVD present.  Cardiovascular: Normal rate and regular rhythm. Exam reveals no gallop and no friction rub.  No murmur heard. Pulmonary/Chest: Effort normal.  Diminished breath sounds bilateral bases  which may be related to body habitus.  Abdominal: Soft. Bowel sounds are normal. There is no tenderness. There is no rebound and no guarding.  Musculoskeletal: Normal range of motion. He exhibits edema. He exhibits no tenderness.  2+ bilateral lower extremity dating edema.  No calf tenderness.  Distal pulses intact.  Lymphadenopathy:    He has no cervical adenopathy.  Neurological: He is alert and oriented to person, place, and time.  Moves all extremities without focal deficit.  Sensation intact.  Skin: Skin is warm and dry. Capillary refill takes less than 2 seconds. No rash noted. He is not diaphoretic. No erythema.  Psychiatric: He has a normal mood and affect. His behavior is normal.  Nursing note and vitals reviewed.    ED Treatments / Results  Labs (all labs ordered are listed, but only abnormal results are displayed) Labs Reviewed  BASIC METABOLIC PANEL  CBC  TROPONIN I  BRAIN NATRIURETIC PEPTIDE  D-DIMER, QUANTITATIVE (NOT AT [facility])    EKG EKG Interpretation  Date/Time:  Friday June 14 2018 15:26:49 EDT Ventricular Rate:  83 PR Interval:  160 QRS Duration: 102 QT Interval:  390 QTC Calculation: 458 R Axis:   10 Text Interpretation:  Normal sinus rhythm Low voltage QRS Possible Anterolateral infarct , age undetermined Abnormal ECG Confirmed by Loren Racer ([id number]) on 06/14/2018 6:43:46 PM   Radiology Dg Chest 2 View  Result Date: 06/14/2018 CLINICAL DATA:  Progressively worsening chest pain for the past week. Weakness and shortness of breath for the past 3-4 days. EXAM: CHEST - 2 VIEW COMPARISON:  None. FINDINGS: Poor inspiration. Normal sized heart. Clear lungs. Mild central peribronchial thickening without significant change. Thoracic spine degenerative changes. IMPRESSION: No acute abnormality.  Stable mild bronchitic changes. Electronically Signed   By: Beckie Salts M.D.   On: 06/14/2018 16:06    Procedures Procedures (including critical care  time)  Medications Ordered in ED Medications  albuterol (PROVENTIL HFA;VENTOLIN HFA) 108 (90 Base) MCG/ACT inhaler 1-2 puff (2 puffs Inhalation Given 06/14/18 2028)  predniSONE (DELTASONE) tablet 60 mg (60 mg Oral Given 06/14/18 2028)     Initial Impression / Assessment and Plan / ED Course  I have reviewed the triage vital signs and the nursing notes.  Pertinent labs & imaging results that were available during my care of the patient were reviewed by me and considered in my medical decision making (see chart for details).     EKG, troponin, d-dimer are normal as well as BNP.  X-ray with evidence of chronic bronchitis.  Concern for possible bronchospasm responsible for patient's symptoms.  Will give short course of steroids and albuterol inhaler and have follow-up closely with his primary physician.  If symptoms persist may need to be referred to pulmonologist.  Final Clinical Impressions(s) / ED Diagnoses   Final diagnoses:  Dyspnea, unspecified type  Bronchitis  ED Discharge Orders        Ordered    predniSONE (DELTASONE) 20 MG tablet     06/14/18 2039       Loren RacerYelverton, Delaine Canter, MD 06/14/18 2040

## 2018-06-14 NOTE — Telephone Encounter (Signed)
So tanya with his risk factors make clear to the pt that our firm recommendation is fgot to the ER, pt has numerous risk factors and sob plus burning in chest could be quite serious, and requires testing we cannot provide here to cxonfirm presence or abscence of serious issues. Call him bak and advise ER, o v next wk for today's scenario not in pt's(or ours) best interest

## 2018-06-14 NOTE — Telephone Encounter (Signed)
I called and asked that he r/c.

## 2018-06-14 NOTE — Telephone Encounter (Signed)
Pt contacted office. Pt stated that he feels SOB sometimes. He has a burning sensation in his chest. Pt states that this started yesterday afternoon. Pt informed to go to ED or urgent care due to no availability. Pt stated that he was at work and did not feel like going to urgent care or ED. Informed patient that we could get him in next week but if he became worse to go to ED. Pt verbalized understanding.

## 2018-06-14 NOTE — Telephone Encounter (Signed)
Patient informed he needed to be evaluated at the Ed. 

## 2018-06-14 NOTE — ED Triage Notes (Signed)
Chest for 1 week, progressively getting worse, having weakness and sob x 3-4 days

## 2018-06-17 ENCOUNTER — Encounter: Payer: Self-pay | Admitting: Family Medicine

## 2018-06-17 ENCOUNTER — Ambulatory Visit: Payer: BLUE CROSS/BLUE SHIELD | Admitting: Family Medicine

## 2018-06-17 VITALS — BP 110/74 | Ht 77.0 in | Wt 367.5 lb

## 2018-06-17 DIAGNOSIS — I10 Essential (primary) hypertension: Secondary | ICD-10-CM

## 2018-06-17 DIAGNOSIS — R000 Tachycardia, unspecified: Secondary | ICD-10-CM

## 2018-06-17 DIAGNOSIS — R0600 Dyspnea, unspecified: Secondary | ICD-10-CM | POA: Diagnosis not present

## 2018-06-17 NOTE — Progress Notes (Signed)
   Subjective:    Patient ID: James Rivas, male    DOB: 09/17/1971, 47 y.o.   MRN: [id number]  HPI Pt here for hospital follow up. Had SOB and rapid heart rate. Feeling a lot better than he was feeling over the weekend. ER prescribed Prednisone, pt states is still taking it and is suppose to take it until Wednesday  Pt was in the ER for eight hrs on Friday, had major work up for chest pain and sob  Was told heart ws ok  Pt was given prednisone--nowiritating and burning stoach   Using prn inhaler     Caused g I distress with milk  Pt note need to sit up in bed, with ongoing sense of sob and sense of heart going rapidly  Pt senses a discomfor  In chest at times, but states not pain,  Tired and fatigued working in the heat currently  Never has smoked    Complete hospital record reviewed in presence of patient.  Also old cardiac work-up reviewed in presence of the patient  Review of Systems No headache, no major weight loss or weight gain, no chest pain no back pain abdominal pain no change in bowel habits complete ROS otherwise negative     Objective:   Physical Exam  Alert and oriented, vitals reviewed and stable, NAD ENT-TM's and ext canals WNL bilat via otoscopic exam Soft palate, tonsils and post pharynx WNL via oropharyngeal exam Neck-symmetric, no masses; thyroid nonpalpable and nontender Pulmonary-no tachypnea or accessory muscle use; Clear without wheezes via auscultation Card--no abnrml murmurs, rhythm reg and rate WNL Carotid pulses symmetric, without bruits       Assessment & Plan:  Impression seen in the emergency room for dyspnea with rapid heart rate and anxiety regarding all of this.  No true chest pain.  Negative d-dimer.  Negative BNP.  Negative troponin.  EKG stable.  Chest x-ray showed suboptimal expansion.  Long discussion held.  Chance of heart disease very low.  Patient still worried, would because of this recommend cardiology referral.  Greater than  50% of this 25 minute face to face visit was spent in counseling and discussion and coordination of care regarding the above diagnosis/diagnosies

## 2018-06-20 ENCOUNTER — Encounter: Payer: Self-pay | Admitting: Family Medicine

## 2018-07-11 ENCOUNTER — Encounter: Payer: Self-pay | Admitting: Cardiology

## 2018-07-11 ENCOUNTER — Ambulatory Visit (INDEPENDENT_AMBULATORY_CARE_PROVIDER_SITE_OTHER): Payer: BLUE CROSS/BLUE SHIELD | Admitting: Cardiology

## 2018-07-11 VITALS — BP 116/69 | HR 78 | Ht 77.0 in | Wt 371.2 lb

## 2018-07-11 DIAGNOSIS — R002 Palpitations: Secondary | ICD-10-CM

## 2018-07-11 NOTE — Progress Notes (Signed)
Clinical Summary [name] is a 47 y.o.male last seen by Dr Purvis SheffieldKoneswaran in 2014   1. Chest pain - in 2014 evaluated for atypical symptoms. Stress echo was normal  - no recent symptoms  2. HTN - compliant with meds   3. Dyspnea - ER visit 05/2018 with dyspnea. Normal trop and D-dimer, EKG nonacute. CXR chronic bronchitis changes. Treated with course of abx and steroids - he has chronic LE edema  - breathing is improving.   4. OSA screen? - occasional snoring, daytime somnolence he attributes 12 hr work days, no apneic episodes.    5. Palpitations - few times a week. Mainly occurs at rest. Feeling of heat pounding for a few minutes. Can have a burning sensations at times - occasional tea, no coffee, occasional sodas, no energy drinks. Occasional beer.     Past Medical History:  Diagnosis Date  . Allergy   . Chest pain   . HTN (hypertension)   . IFG (impaired fasting glucose)   . Impaired fasting glucose   . Morbid obesity (HCC)   . Venous stasis      No Known Allergies   Current Outpatient Medications  Medication Sig Dispense Refill  . chlorthalidone (HYGROTON) 25 MG tablet Take 1 tablet (25 mg total) by mouth daily. 30 tablet 5  . enalapril (VASOTEC) 20 MG tablet Take 1 tablet (20 mg total) by mouth daily. 30 tablet 5  . predniSONE (DELTASONE) 20 MG tablet 3 tabs po day one, then 2 po daily x 4 days 11 tablet 0   No current facility-administered medications for this visit.      Past Surgical History:  Procedure Laterality Date  . FINGER SURGERY Left    left ring finger 2 surgeries  . I&D EXTREMITY  11/29/2011   Procedure: IRRIGATION AND DEBRIDEMENT EXTREMITY;  Surgeon: Sharma CovertFred W Ortmann;  Location: [facility] OR;  Service: Orthopedics;  Laterality: Left;  I&D Right Long and Index Fingers with Tendon Repair as Needed     No Known Allergies    Family History  Problem Relation Age of Onset  . Diabetes Father   . Diabetes Paternal Aunt      Social  History [name] reports that he has never smoked. He has never used smokeless tobacco. [name] reports that he drinks alcohol.   Review of Systems CONSTITUTIONAL: No weight loss, fever, chills, weakness or fatigue.  HEENT: Eyes: No visual loss, blurred vision, double vision or yellow sclerae.No hearing loss, sneezing, congestion, runny nose or sore throat.  SKIN: No rash or itching.  CARDIOVASCULAR: per hpi RESPIRATORY: No shortness of breath, cough or sputum.  GASTROINTESTINAL: No anorexia, nausea, vomiting or diarrhea. No abdominal pain or blood.  GENITOURINARY: No burning on urination, no polyuria NEUROLOGICAL: No headache, dizziness, syncope, paralysis, ataxia, numbness or tingling in the extremities. No change in bowel or bladder control.  MUSCULOSKELETAL: No muscle, back pain, joint pain or stiffness.  LYMPHATICS: No enlarged nodes. No history of splenectomy.  PSYCHIATRIC: No history of depression or anxiety.  ENDOCRINOLOGIC: No reports of sweating, cold or heat intolerance. No polyuria or polydipsia.  Marland Kitchen.   Physical Examination Vitals:   07/11/18 1328  BP: 116/69  Pulse: 78  SpO2: 96%   Vitals:   07/11/18 1328  Weight: (!) 371 lb 3.2 oz (168.4 kg)  Height: 6\' 5"  (1.956 m)    Gen: resting comfortably, no acute distress HEENT: no scleral icterus, pupils equal round and reactive, no palptable cervical adenopathy,  CV: RRR, no m/r/g, no jvd Resp: Clear to auscultation bilaterally GI: abdomen is soft, non-tender, non-distended, normal bowel sounds, no hepatosplenomegaly MSK: extremities are warm, no edema.  Skin: warm, no rash Neuro:  no focal deficits Psych: appropriate affect   Diagnostic Studies 07/2013 Stress echo Study Conclusions  - Stress ECG conclusions: No diagnostic ST segment abnormalities. Occasional PVCs in recovery. Couplet and irregular 3 beat run of WCT - likely combination of fusion beats and PVCs during exercise. The stress ECG  was negative for ischemia. - Staged echo: There was no echocardiographic evidence for stress-induced ischemia.   08/2012 echo Study Conclusions  - Left ventricle: The cavity size was normal. Wall thickness was increased in a pattern of mild LVH. Systolic function was normal. The estimated ejection fraction was in the range of 55% to 60%. Wall motion was normal; there were no regional wall motion abnormalities. Doppler parameters are consistent with abnormal left ventricular relaxation (grade 1 diastolic dysfunction). - Mitral valve: Trivial regurgitation. - Left atrium: The atrium was at the upper limits of normal in size. - Right ventricle: The cavity size was mildly dilated. - Right atrium: The atrium was mildly dilated. - Tricuspid valve: Trivial regurgitation. - Pericardium, extracardiac: There was no pericardial effusion.  Assessment and Plan  1. Palpitations - recent symptoms. EKGs have shown NSR. We will plan for a 7 day event monitor to further evaluate    F/u pending monitor results, f/u would be with Dr Purvis SheffieldKoneswaran who had seen him in 2014.       James Rivas, M.D.

## 2018-07-11 NOTE — Patient Instructions (Signed)
Your physician recommends that you schedule a follow-up appointment in: TO BE DETERMINED WITH DR BRANCH  Your physician recommends that you continue on your current medications as directed. Please refer to the Current Medication list given to you today.  Your physician has recommended that you wear an event monitor FOR 7 DAYS. Event monitors are medical devices that record the heart's electrical activity. Doctors most often us these monitors to diagnose arrhythmias. Arrhythmias are problems with the speed or rhythm of the heartbeat. The monitor is a small, portable device. You can wear one while you do your normal daily activities. This is usually used to diagnose what is causing palpitations/syncope (passing out).  Thank you for choosing [facility]!!      

## 2018-07-26 ENCOUNTER — Telehealth: Payer: Self-pay | Admitting: Cardiology

## 2018-07-26 NOTE — Telephone Encounter (Signed)
Patient called to check on monitor results.  Leave message if no one answers

## 2018-07-26 NOTE — Telephone Encounter (Signed)
Patient informed that monitor result received today and he would be contacted with results once reviewed by Dr. Wyline MoodBranch. Verbalized understanding.

## 2018-08-06 ENCOUNTER — Telehealth: Payer: Self-pay | Admitting: *Deleted

## 2018-08-06 DIAGNOSIS — R9431 Abnormal electrocardiogram [ECG] [EKG]: Secondary | ICD-10-CM

## 2018-08-06 DIAGNOSIS — I493 Ventricular premature depolarization: Secondary | ICD-10-CM

## 2018-08-06 MED ORDER — METOPROLOL TARTRATE 25 MG PO TABS: 12.5000 mg | ORAL_TABLET | Freq: Two times a day (BID) | ORAL | 1 refills | Status: DC

## 2018-08-06 NOTE — Telephone Encounter (Signed)
-----   Message from Antoine Poche, MD sent at 08/05/2018  4:23 PM EDT ----- Heart monitor does show some extra heart beats at times (PVCs), sometimes he can have a few in a row. Sometimes this just happens, but it also can in some cases be a sign of some weakness to the heart function . He needs an echo to evaluate for PVCs, abnormal EKG. If having lots of symptoms of heart fluttering  we can try lopressor 12.5mg  bid. He is a regular patient of Dr Kirtland Bouchard, needs f/u 6 weeks or so  Dominga Ferry MD

## 2018-08-06 NOTE — Telephone Encounter (Signed)
Pt voiced understanding and says his fluttering is bothersome especially when laying down, will rx to pharmacy and forward to schedulers for echo appt - routed to pcp

## 2018-08-07 ENCOUNTER — Ambulatory Visit: Payer: BLUE CROSS/BLUE SHIELD | Admitting: Cardiovascular Disease

## 2018-08-14 ENCOUNTER — Ambulatory Visit (INDEPENDENT_AMBULATORY_CARE_PROVIDER_SITE_OTHER): Payer: BLUE CROSS/BLUE SHIELD

## 2018-08-14 ENCOUNTER — Other Ambulatory Visit: Payer: Self-pay

## 2018-08-14 DIAGNOSIS — I493 Ventricular premature depolarization: Secondary | ICD-10-CM | POA: Diagnosis not present

## 2018-08-14 DIAGNOSIS — R9431 Abnormal electrocardiogram [ECG] [EKG]: Secondary | ICD-10-CM

## 2018-08-16 ENCOUNTER — Telehealth: Payer: Self-pay | Admitting: *Deleted

## 2018-08-16 NOTE — Telephone Encounter (Signed)
-----   Message from Albertine PatriciaStaci T Ashworth, CMA sent at 08/16/2018  1:30 PM EDT -----   ----- Message ----- From: Antoine PocheBranch, Jonathan F, MD Sent: 08/16/2018  12:53 PM EDT To: Albertine PatriciaStaci T Ashworth, CMA  Normal echo. Dr Kirtland BouchardK to discuss in detail at f/u  Dominga FerryJ Branch MD

## 2018-08-21 ENCOUNTER — Encounter

## 2018-08-22 NOTE — Telephone Encounter (Signed)
Notes recorded by Lesle Chris, LPN on 0/98/1191 at 9:06 AM EDT Patient notified. Copy to pmd. Follow up scheduled for 09/24/2018. ------  Notes recorded by Eustace Moore, RN on 08/16/2018 at 2:15 PM EDT Left message for patient to call office. ------

## 2018-09-24 ENCOUNTER — Ambulatory Visit: Payer: BLUE CROSS/BLUE SHIELD | Admitting: Cardiovascular Disease

## 2018-09-26 ENCOUNTER — Encounter: Payer: Self-pay | Admitting: Cardiovascular Disease

## 2018-09-26 ENCOUNTER — Ambulatory Visit (INDEPENDENT_AMBULATORY_CARE_PROVIDER_SITE_OTHER): Payer: BLUE CROSS/BLUE SHIELD | Admitting: Cardiovascular Disease

## 2018-09-26 VITALS — BP 138/88 | HR 90 | Ht 77.0 in | Wt 370.0 lb

## 2018-09-26 DIAGNOSIS — R002 Palpitations: Secondary | ICD-10-CM | POA: Diagnosis not present

## 2018-09-26 DIAGNOSIS — I493 Ventricular premature depolarization: Secondary | ICD-10-CM

## 2018-09-26 NOTE — Patient Instructions (Signed)
Medication Instructions:  Your physician recommends that you continue on your current medications as directed. Please refer to the Current Medication list given to you today.  If you need a refill on your cardiac medications before your next appointment, please call your pharmacy.   Lab work: NONE If you have labs (blood work) drawn today and your tests are completely normal, you will receive your results only by: . MyChart Message (if you have MyChart) OR . A paper copy in the mail If you have any lab test that is abnormal or we need to change your treatment, we will call you to review the results.  Testing/Procedures: NONE  Follow-Up: At [facility], you and your health needs are our priority.  As part of our continuing mission to provide you with exceptional heart care, we have created designated Provider Care Teams.  These Care Teams include your primary Cardiologist (physician) and Advanced Practice Providers (APPs -  Physician Assistants and Nurse Practitioners) who all work together to provide you with the care you need, when you need it. You will need a follow up appointment in 1 years.  Please call our office 2 months in advance to schedule this appointment.  You may see Suresh Koneswaran, MD or one of the following Advanced Practice Providers on your designated Care Team:   Brittany Strader, PA-C ([place] Office) . Michele Lenze, PA-C ([place] Office)  Any Other Special Instructions Will Be Listed Below (If Applicable). NONE   

## 2018-09-26 NOTE — Progress Notes (Signed)
SUBJECTIVE: The patient returns for follow-up after undergoing cardiovascular testing performed for the evaluation of palpitations.  Event monitoring demonstrated sinus rhythm with isolated PVCs, couplets, and shorts runs of NSVT up to 4 beats.  He was started on Lopressor 12.5 mg bid.  Echocardiogram on 08/14/18 was normal, LVEF 60-65%.  He had been going through a lot of stress over the summer but things have subsided and he is feeling less anxious now.  He had been having palps 2-3 times/ week but he hasn't had any in the last 10 days.  He wants to exercise more and lose weight.  He likes playing basketball with his son.  He is a [employer] cabin crew.   Review of Systems: As per "subjective", otherwise negative.  No Known Allergies  Current Outpatient Medications  Medication Sig Dispense Refill  . chlorthalidone (HYGROTON) 25 MG tablet Take 1 tablet (25 mg total) by mouth daily. 30 tablet 5  . enalapril (VASOTEC) 20 MG tablet Take 1 tablet (20 mg total) by mouth daily. 30 tablet 5  . metoprolol tartrate (LOPRESSOR) 25 MG tablet Take 0.5 tablets (12.5 mg total) by mouth 2 (two) times daily. 90 tablet 1   No current facility-administered medications for this visit.     Past Medical History:  Diagnosis Date  . Allergy   . Chest pain   . HTN (hypertension)   . IFG (impaired fasting glucose)   . Impaired fasting glucose   . Morbid obesity (HCC)   . Venous stasis     Past Surgical History:  Procedure Laterality Date  . FINGER SURGERY Left    left ring finger 2 surgeries  . I&D EXTREMITY  11/29/2011   Procedure: IRRIGATION AND DEBRIDEMENT EXTREMITY;  Surgeon: Sharma Covert;  Location: [facility] OR;  Service: Orthopedics;  Laterality: Left;  I&D Right Long and Index Fingers with Tendon Repair as Needed    Social History   Socioeconomic History  . Marital status: Married    Spouse name: Not on file  . Number of children: Not on file  . Years of education: Not on file  .  Highest education level: Not on file  Occupational History  . Not on file  Social Needs  . Financial resource strain: Not on file  . Food insecurity:    Worry: Not on file    Inability: Not on file  . Transportation needs:    Medical: Not on file    Non-medical: Not on file  Tobacco Use  . Smoking status: Never Smoker  . Smokeless tobacco: Never Used  Substance and Sexual Activity  . Alcohol use: Yes    Comment: occ  . Drug use: No  . Sexual activity: Never  Lifestyle  . Physical activity:    Days per week: Not on file    Minutes per session: Not on file  . Stress: Not on file  Relationships  . Social connections:    Talks on phone: Not on file    Gets together: Not on file    Attends religious service: Not on file    Active member of club or organization: Not on file    Attends meetings of clubs or organizations: Not on file    Relationship status: Not on file  . Intimate partner violence:    Fear of current or ex partner: Not on file    Emotionally abused: Not on file    Physically abused: Not on file    Forced  sexual activity: Not on file  Other Topics Concern  . Not on file  Social History Narrative  . Not on file     Vitals:   09/26/18 1330  BP: 140/82  Pulse: 90  SpO2: 96%  Weight: (!) 370 lb (167.8 kg)  Height: 6\' 5"  (1.956 m)    Wt Readings from Last 3 Encounters:  09/26/18 (!) 370 lb (167.8 kg)  07/11/18 (!) 371 lb 3.2 oz (168.4 kg)  06/17/18 (!) 367 lb 8 oz (166.7 kg)     PHYSICAL EXAM General: NAD HEENT: Normal. Neck: No JVD, no thyromegaly. Lungs: Clear to auscultation bilaterally with normal respiratory effort. CV: Regular rate and rhythm, normal S1/S2, no S3/S4, no murmur. No pretibial or periankle edema.   Abdomen: Soft, nontender, obese.  Neurologic: Alert and oriented.  Psych: Normal affect. Skin: Normal. Musculoskeletal: No gross deformities.    ECG: Reviewed above under Subjective   Labs: Lab Results  Component Value  Date/Time   K 3.9 06/14/2018 04:18 PM   BUN 16 06/14/2018 04:18 PM   BUN 14 12/08/2017 08:59 AM   CREATININE 1.06 06/14/2018 04:18 PM   CREATININE 0.98 09/26/2014 10:09 AM   ALT 45 (H) 12/08/2017 08:59 AM   HGB 15.2 06/14/2018 04:18 PM     Lipids: Lab Results  Component Value Date/Time   LDLCALC 88 12/08/2017 08:59 AM   CHOL 142 12/08/2017 08:59 AM   TRIG 79 12/08/2017 08:59 AM   HDL 38 (L) 12/08/2017 08:59 AM       ASSESSMENT AND PLAN: 1. Palpitations/NSVT: Symptomatically stable on Lopressor 12.5 mg bid. No changes. Cardiac structure and function is normal by echocardiogram in Sept 2019.  2. HTN: BP is mildly elevated. No changes. On chlorthalidone 25 mg, enalapril 20 mg, and Lopressor 12.5 mg bid.   Disposition: Follow up 1 year.   Prentice Docker, M.D., F.A.C.C.

## 2018-11-25 ENCOUNTER — Other Ambulatory Visit: Payer: Self-pay | Admitting: Family Medicine

## 2018-11-25 NOTE — Telephone Encounter (Signed)
30 d ok, needs six mo visit latter [place]

## 2018-12-06 ENCOUNTER — Encounter: Payer: BLUE CROSS/BLUE SHIELD | Admitting: Family Medicine

## 2018-12-25 ENCOUNTER — Other Ambulatory Visit: Payer: Self-pay | Admitting: Family Medicine

## 2018-12-25 NOTE — Telephone Encounter (Signed)
One mo ea 

## 2019-01-03 ENCOUNTER — Ambulatory Visit (INDEPENDENT_AMBULATORY_CARE_PROVIDER_SITE_OTHER): Payer: BLUE CROSS/BLUE SHIELD | Admitting: Family Medicine

## 2019-01-03 ENCOUNTER — Encounter: Payer: Self-pay | Admitting: Family Medicine

## 2019-01-03 VITALS — BP 138/82 | Ht 77.0 in | Wt 369.0 lb

## 2019-01-03 DIAGNOSIS — Z125 Encounter for screening for malignant neoplasm of prostate: Secondary | ICD-10-CM | POA: Diagnosis not present

## 2019-01-03 DIAGNOSIS — Z0000 Encounter for general adult medical examination without abnormal findings: Secondary | ICD-10-CM

## 2019-01-03 DIAGNOSIS — I878 Other specified disorders of veins: Secondary | ICD-10-CM | POA: Diagnosis not present

## 2019-01-03 DIAGNOSIS — I10 Essential (primary) hypertension: Secondary | ICD-10-CM

## 2019-01-03 MED ORDER — METOPROLOL TARTRATE 25 MG PO TABS: 12.5000 mg | ORAL_TABLET | Freq: Two times a day (BID) | ORAL | 1 refills | Status: DC

## 2019-01-03 MED ORDER — ENALAPRIL MALEATE 20 MG PO TABS: ORAL_TABLET | ORAL | 5 refills | Status: DC

## 2019-01-03 MED ORDER — CHLORTHALIDONE 25 MG PO TABS: ORAL_TABLET | ORAL | 5 refills | Status: DC

## 2019-01-03 NOTE — Progress Notes (Signed)
Subjective:    Patient ID: James Rivas, male    DOB: 14-Jul-1971, 48 y.o.   MRN: [id number]  HPI  The patient comes in today for a wellness visit.    A review of their health history was completed.  A review of medications was also completed.  Any needed refills; yes  Eating habits: trying to eat healthy  Falls/  MVA accidents in past few months: none  Regular exercise: when schedule allows  Specialist pt sees on regular basis: none  Preventative health issues were discussed.   Additional concerns: right shoulder pains  Blood pressure medicine and blood pressure levels reviewed today with patient. Compliant with blood pressure medicine. States does not miss a dose. No obvious side effects. Blood pressure generally good when checked elsewhere. Watching salt intake.   dimished energy  Not resting eough  Keeping wide  Open pace   Works three am to three pm  Still at [employer]  Needs to go to closer bathroom, needs not  Sone pasy h s basketball    Review of Systems  Constitutional: Negative for activity change, appetite change and fever.  HENT: Negative for congestion and rhinorrhea.   Eyes: Negative for discharge.  Respiratory: Negative for cough and wheezing.   Cardiovascular: Negative for chest pain.  Gastrointestinal: Negative for abdominal pain, blood in stool and vomiting.  Genitourinary: Negative for difficulty urinating and frequency.  Musculoskeletal: Negative for neck pain.  Skin: Negative for rash.  Allergic/Immunologic: Negative for environmental allergies and food allergies.  Neurological: Negative for weakness and headaches.  Psychiatric/Behavioral: Negative for agitation.  All other systems reviewed and are negative.      Objective:   Physical Exam Vitals signs reviewed.  Constitutional:      Appearance: He is well-developed.     Comments: Morbid obesity present  HENT:     Head: Normocephalic and atraumatic.     Right Ear: External ear  normal.     Left Ear: External ear normal.     Nose: Nose normal.  Eyes:     Pupils: Pupils are equal, round, and reactive to light.  Neck:     Musculoskeletal: Normal range of motion and neck supple.     Thyroid: No thyromegaly.  Cardiovascular:     Rate and Rhythm: Normal rate and regular rhythm.     Heart sounds: Normal heart sounds. No murmur.  Pulmonary:     Effort: Pulmonary effort is normal. No respiratory distress.     Breath sounds: Normal breath sounds. No wheezing.  Abdominal:     General: Bowel sounds are normal. There is no distension.     Palpations: Abdomen is soft. There is no mass.     Tenderness: There is no abdominal tenderness.  Genitourinary:    Penis: Normal.   Musculoskeletal: Normal range of motion.     Comments: Chronic venous stasis changes both legs  Lymphadenopathy:     Cervical: No cervical adenopathy.  Skin:    General: Skin is warm and dry.     Findings: No erythema.  Neurological:     Mental Status: He is alert.     Motor: No abnormal muscle tone.  Psychiatric:        Behavior: Behavior normal.        Judgment: Judgment normal.           Assessment & Plan:  Needs notes  Impression #1 wellness, diet disc, exercise disc, vaccines discussed and encouraged.  Appropriate blood work.  2.  Morbid obesity.  Patient has actually lost 22 pounds compared to 1 year ago.  I still think he needs bariatric specialist but he declines.  Challenged to lose 15 more pounds in the next 6 months  3.  Hypertension good control discussed to maintain same meds  4.  Venous stasis dermatitis discussed.  Patient tries to wear compressions  Stockings  Follow-up in 6 months

## 2019-01-11 DIAGNOSIS — I10 Essential (primary) hypertension: Secondary | ICD-10-CM | POA: Diagnosis not present

## 2019-01-11 DIAGNOSIS — Z125 Encounter for screening for malignant neoplasm of prostate: Secondary | ICD-10-CM | POA: Diagnosis not present

## 2019-01-12 LAB — BASIC METABOLIC PANEL
BUN/Creatinine Ratio: 15 (ref 9–20)
BUN: 17 mg/dL (ref 6–24)
CALCIUM: 9.7 mg/dL (ref 8.7–10.2)
CHLORIDE: 100 mmol/L (ref 96–106)
CO2: 26 mmol/L (ref 20–29)
Creatinine, Ser: 1.15 mg/dL (ref 0.76–1.27)
GFR calc Af Amer: 86 mL/min/{1.73_m2} (ref >59)
GFR calc non Af Amer: 74 mL/min/{1.73_m2} (ref >59)
GLUCOSE: 99 mg/dL (ref 65–99)
POTASSIUM: 4.1 mmol/L (ref 3.5–5.2)
Sodium: 139 mmol/L (ref 134–144)

## 2019-01-12 LAB — HEPATIC FUNCTION PANEL
ALK PHOS: 59 IU/L (ref 39–117)
ALT: 24 IU/L (ref 0–44)
AST: 21 IU/L (ref 0–40)
Albumin: 4.3 g/dL (ref 4.0–5.0)
Bilirubin Total: 0.5 mg/dL (ref 0.0–1.2)
Bilirubin, Direct: 0.13 mg/dL (ref 0.00–0.40)
Total Protein: 7.5 g/dL (ref 6.0–8.5)

## 2019-01-12 LAB — LIPID PANEL
Chol/HDL Ratio: 4.3 ratio (ref 0.0–5.0)
Cholesterol, Total: 179 mg/dL (ref 100–199)
HDL: 42 mg/dL (ref >39)
LDL CALC: 121 mg/dL — AB (ref 0–99)
Triglycerides: 82 mg/dL (ref 0–149)
VLDL CHOLESTEROL CAL: 16 mg/dL (ref 5–40)

## 2019-01-12 LAB — PSA: PROSTATE SPECIFIC AG, SERUM: 0.2 ng/mL (ref 0.0–4.0)

## 2019-01-19 ENCOUNTER — Encounter: Payer: Self-pay | Admitting: Family Medicine

## 2019-03-18 ENCOUNTER — Other Ambulatory Visit: Payer: Self-pay

## 2019-03-18 ENCOUNTER — Ambulatory Visit (INDEPENDENT_AMBULATORY_CARE_PROVIDER_SITE_OTHER): Payer: BLUE CROSS/BLUE SHIELD | Admitting: Family Medicine

## 2019-03-18 DIAGNOSIS — R0600 Dyspnea, unspecified: Secondary | ICD-10-CM | POA: Diagnosis not present

## 2019-03-18 NOTE — Progress Notes (Signed)
   Subjective:    Patient ID: James Rivas, male    DOB: 1971/09/12, 48 y.o.   MRN: [id number] Audio plus visual HPI Pt states he has to take a deep breath every so often. Pt states that he feels as if he can not get enough air into lungs. Pt states no chest pain, no chest tightness, no chest discomfort. No other symptoms. Started yesterday.  Virtual Visit via Video Note  I connected with James Rivas on 03/18/19 at  2:30 PM EDT by a video enabled telemedicine application and verified that I am speaking with the correct person using two identifiers.   I discussed the limitations of evaluation and management by telemedicine and the availability of in person appointments. The patient expressed understanding and agreed to proceed.  History of Present Illness:    Observations/Objective:   Assessment and Plan:   Follow Up Instructions:    I discussed the assessment and treatment plan with the patient. The patient was provided an opportunity to ask questions and all were answered. The patient agreed with the plan and demonstrated an understanding of the instructions.   The patient was advised to call back or seek an in-person evaluation if the symptoms worsen or if the condition fails to improve as anticipated.  I provided 20 minutes of non-face-to-face time during this encounter.   Marlowe [place], LPN  Patient experiencing dyspnea.  Occasional sighing.  Sense that he cannot take a full breath.  Has had similar spells in the past when under stress.  Admits some anxiety understandably due to the coronavirus.  Also due to being unemployed in recent weeks.  No chest pain no palpitations no depression  Review of Systems No headache, no major weight loss or weight gain, no chest pain no back pain abdominal pain no change in bowel habits complete ROS otherwise negative     Objective:   Physical Exam   Virtual visit     Assessment & Plan:  Impression intermittent dyspnea.  Likely  related to anxiety.  Discussed.  No need for major work-up at this time.  Patient declined offer for occasional nerve tablet.  May call back

## 2019-04-28 ENCOUNTER — Other Ambulatory Visit: Payer: Self-pay | Admitting: Family Medicine

## 2019-07-01 ENCOUNTER — Encounter: Payer: Self-pay | Admitting: Family Medicine

## 2019-07-01 ENCOUNTER — Ambulatory Visit (INDEPENDENT_AMBULATORY_CARE_PROVIDER_SITE_OTHER): Payer: BC Managed Care – PPO | Admitting: Family Medicine

## 2019-07-01 ENCOUNTER — Ambulatory Visit (HOSPITAL_COMMUNITY)
Admission: RE | Admit: 2019-07-01 | Discharge: 2019-07-01 | Disposition: A | Discharge status: Home / Self Care | Payer: BC Managed Care – PPO | Source: Ambulatory Visit | Attending: Family Medicine | Admitting: Family Medicine

## 2019-07-01 ENCOUNTER — Other Ambulatory Visit: Payer: Self-pay

## 2019-07-01 VITALS — BP 134/86 | Temp 97.9°F | Wt 379.6 lb

## 2019-07-01 DIAGNOSIS — M25561 Pain in right knee: Secondary | ICD-10-CM

## 2019-07-01 DIAGNOSIS — I10 Essential (primary) hypertension: Secondary | ICD-10-CM | POA: Diagnosis not present

## 2019-07-01 MED ORDER — ETODOLAC 400 MG PO TABS: ORAL_TABLET | ORAL | 0 refills | Status: DC

## 2019-07-01 MED ORDER — ENALAPRIL MALEATE 20 MG PO TABS: ORAL_TABLET | ORAL | 5 refills | Status: DC

## 2019-07-01 NOTE — Progress Notes (Signed)
   Subjective:    Patient ID: James Rivas, male    DOB: 12-09-1970, 48 y.o.   MRN: [id number]  Knee Pain  Incident onset: one month ago. Incident location: pt moved to the 3rd floor recently  The pain is present in the right knee (does radiate to lower leg at times). Quality: throbbing. The pain is severe. The pain has been constant since onset. Associated symptoms include a loss of sensation. The symptoms are aggravated by movement. Treatments tried: heating pad, ibuprofen. The treatment provided mild relief.   Right knee very painful going up a lot of stairs  Last couple months more a more painful  Popping senation   Some old injuries in the past   Blood pressure medicine and blood pressure levels reviewed today with patient. Compliant with blood pressure medicine. States does not miss a dose. No obvious side effects. Blood pressure generally good when checked elsewhere. Watching salt intake.        Objective:   Physical Exam  Alert vitals stable, NAD. Blood pressure good on repeat. HEENT normal. Lungs clear. Heart regular rate and rhythm. Right knee mild effusion no joint line tenderness mild crepitations positive pain with extension      Assessment & Plan:  Impression 1 hypertension good control discussed maintain same meds  2.  Knee injury potential flare of old posttraumatic arthritis.  Await x-ray anti-inflammatory medicine prescribed if persists may need to see an orthopedist

## 2019-07-04 ENCOUNTER — Ambulatory Visit: Payer: BLUE CROSS/BLUE SHIELD | Admitting: Family Medicine

## 2019-07-17 ENCOUNTER — Other Ambulatory Visit: Payer: Self-pay | Admitting: Family Medicine

## 2019-07-27 ENCOUNTER — Other Ambulatory Visit: Payer: Self-pay | Admitting: Family Medicine

## 2019-08-22 ENCOUNTER — Other Ambulatory Visit: Payer: Self-pay

## 2019-08-22 ENCOUNTER — Ambulatory Visit (INDEPENDENT_AMBULATORY_CARE_PROVIDER_SITE_OTHER): Payer: BC Managed Care – PPO | Admitting: Nurse Practitioner

## 2019-08-22 DIAGNOSIS — J019 Acute sinusitis, unspecified: Secondary | ICD-10-CM | POA: Diagnosis not present

## 2019-08-22 MED ORDER — AMOXICILLIN-POT CLAVULANATE 875-125 MG PO TABS: 1.0000 | ORAL_TABLET | Freq: Two times a day (BID) | ORAL | 0 refills | Status: DC

## 2019-08-22 NOTE — Progress Notes (Signed)
  PHONE VISIT Subjective:    Patient ID: James Rivas, male    DOB: 1971-09-19, 48 y.o.   MRN: [id number]  Headache  This is a new problem. The current episode started 1 to 4 weeks ago. Pain location: behind eyes. Quality: migraine or sinus. Exacerbated by: OTC medications.   Pt denies fever, chills, ear pain, sore throat, or cough. Producing some mucus at times. Pt has some nasal drainage, colorless. Pt complaining of a headache and sinus pressure, located between and behind his eyes for the past week. Pt has taken Coricidin HBP with some relief. Pt has a history of seasonal allergies.   Virtual Visit via Video Note  I connected with Eather Colas on 08/22/19 at 11:00 AM EDT by a video enabled telemedicine application and verified that I am speaking with the correct person using two identifiers.  Location: Patient: home Provider: office   I discussed the limitations of evaluation and management by telemedicine and the availability of in person appointments. The patient expressed understanding and agreed to proceed.  History of Present Illness:    Observations/Objective:  Today's visit was via telephone Physical exam was not possible for this visit  Assessment and Plan: Problem List Items Addressed This Visit    None    Visit Diagnoses    Acute rhinosinusitis    -  Primary   Relevant Medications   amoxicillin-clavulanate (AUGMENTIN) 875-125 MG tablet     -Take full course of antibiotics. Take antibiotics with food. Recommended adding OTC nasal steroid spray to Coricidin. If new symptoms, such as fever chills, cough or shortness of breath appear or symptoms unrelieved after antibiotic therapy please contact provider for in-person visit.   Follow Up Instructions:    I discussed the assessment and treatment plan with the patient. The patient was provided an opportunity to ask questions and all were answered. The patient agreed with the plan and demonstrated an understanding of the  instructions.   The patient was advised to call back or seek an in-person evaluation if the symptoms worsen or if the condition fails to improve as anticipated.

## 2019-08-23 ENCOUNTER — Encounter: Payer: Self-pay | Admitting: Nurse Practitioner

## 2019-08-25 DIAGNOSIS — L309 Dermatitis, unspecified: Secondary | ICD-10-CM | POA: Diagnosis not present

## 2019-08-25 DIAGNOSIS — M79671 Pain in right foot: Secondary | ICD-10-CM | POA: Diagnosis not present

## 2019-08-25 DIAGNOSIS — L851 Acquired keratosis [keratoderma] palmaris et plantaris: Secondary | ICD-10-CM | POA: Diagnosis not present

## 2019-08-25 DIAGNOSIS — M7740 Metatarsalgia, unspecified foot: Secondary | ICD-10-CM | POA: Diagnosis not present

## 2019-10-25 ENCOUNTER — Other Ambulatory Visit: Payer: Self-pay | Admitting: Family Medicine

## 2019-10-27 DIAGNOSIS — L851 Acquired keratosis [keratoderma] palmaris et plantaris: Secondary | ICD-10-CM | POA: Diagnosis not present

## 2019-10-27 DIAGNOSIS — M79671 Pain in right foot: Secondary | ICD-10-CM | POA: Diagnosis not present

## 2019-10-27 DIAGNOSIS — R234 Changes in skin texture: Secondary | ICD-10-CM | POA: Diagnosis not present

## 2019-10-27 DIAGNOSIS — L309 Dermatitis, unspecified: Secondary | ICD-10-CM | POA: Diagnosis not present

## 2019-10-31 ENCOUNTER — Telehealth: Payer: Self-pay | Admitting: Family Medicine

## 2019-10-31 NOTE — Telephone Encounter (Signed)
Pt dropped off physical form that needs to be filled out for work.   Form placed in nurse box at nurse station.   Please fax to [phone]

## 2019-11-03 ENCOUNTER — Other Ambulatory Visit: Payer: Self-pay

## 2019-11-03 ENCOUNTER — Ambulatory Visit (INDEPENDENT_AMBULATORY_CARE_PROVIDER_SITE_OTHER): Payer: BC Managed Care – PPO | Admitting: Family Medicine

## 2019-11-03 DIAGNOSIS — R0681 Apnea, not elsewhere classified: Secondary | ICD-10-CM | POA: Diagnosis not present

## 2019-11-03 DIAGNOSIS — I10 Essential (primary) hypertension: Secondary | ICD-10-CM

## 2019-11-03 DIAGNOSIS — G4730 Sleep apnea, unspecified: Secondary | ICD-10-CM

## 2019-11-03 DIAGNOSIS — R5383 Other fatigue: Secondary | ICD-10-CM | POA: Diagnosis not present

## 2019-11-03 DIAGNOSIS — R0683 Snoring: Secondary | ICD-10-CM

## 2019-11-03 MED ORDER — METOPROLOL TARTRATE 25 MG PO TABS: ORAL_TABLET | ORAL | 1 refills | Status: DC

## 2019-11-03 MED ORDER — CHLORTHALIDONE 25 MG PO TABS: ORAL_TABLET | ORAL | 1 refills | Status: DC

## 2019-11-03 MED ORDER — ENALAPRIL MALEATE 20 MG PO TABS: ORAL_TABLET | ORAL | 1 refills | Status: DC

## 2019-11-03 NOTE — Progress Notes (Signed)
   Subjective:  Audio plus video  Patient ID: James Rivas, male    DOB: [birth year], 48 y.o.   MRN: [id number]  HPI Pt is having issue with snoring. Snoring has become worse. [place] questionnaire completed. Pt states he has nodded off while driving but that does not happen often. Pt does quit breathing but rarely.   Virtual Visit via Video Note  I connected with James Rivas on 11/03/19 at  3:30 PM EST by a video enabled telemedicine application and verified that I am speaking with the correct person using two identifiers.  Location: Patient: home Provider: office   I discussed the limitations of evaluation and management by telemedicine and the availability of in person appointments. The patient expressed understanding and agreed to proceed.  History of Present Illness:    Observations/Objective:   Assessment and Plan:   Follow Up Instructions:    I discussed the assessment and treatment plan with the patient. The patient was provided an opportunity to ask questions and all were answered. The patient agreed with the plan and demonstrated an understanding of the instructions.   The patient was advised to call back or seek an in-person evaluation if the symptoms worsen or if the condition fails to improve as anticipated.  I provided 20 minutes of non-face-to-face time during this encounter.   Vicente Males, LPN  Progressive difficulty with snoring.  Progressive difficulty with breathing stopping while sleeping.  Progressive difficulty with fatigue and tiredness during the day.  Positive history of hypertension.  Positive history of substantial morbid obesity  Also time for chronic concerns  Blood pressure medicine and blood pressure levels reviewed today with patient. Compliant with blood pressure medicine. States does not miss a dose. No obvious side effects. Blood pressure generally good when checked elsewhere. Watching salt intake.        Review of Systems No headache  no chest pain chronic shortness of breath    Objective:   Physical Exam   Virtual     Assessment & Plan:  Impression probable sleep apnea.  High improvement score.  Numerous risk factors discussed we will press on with sleep study this is definitely advisable in this patient  2.  Hypertension.  Good control discussed compliance discussed exercise discussed  Repeat blood work.  Sleep study.  Diet exercise discussed.  Follow-up in 6 months

## 2019-11-09 ENCOUNTER — Encounter: Payer: Self-pay | Admitting: Family Medicine

## 2019-11-18 ENCOUNTER — Telehealth: Payer: Self-pay | Admitting: Family Medicine

## 2019-11-18 NOTE — Telephone Encounter (Signed)
Pt is checking on prior authorization on sleep study.

## 2019-11-25 NOTE — Telephone Encounter (Signed)
Called pt, explained that the [facility] does PA's & scheduling, gave pt # to [facility] to call to check status, pt verbalized understanding

## 2019-11-26 ENCOUNTER — Other Ambulatory Visit (HOSPITAL_BASED_OUTPATIENT_CLINIC_OR_DEPARTMENT_OTHER): Payer: Self-pay

## 2019-12-12 ENCOUNTER — Other Ambulatory Visit: Payer: Self-pay

## 2019-12-12 ENCOUNTER — Other Ambulatory Visit (HOSPITAL_COMMUNITY)
Admission: RE | Admit: 2019-12-12 | Discharge: 2019-12-12 | Disposition: A | Discharge status: Home / Self Care | Payer: BC Managed Care – PPO | Source: Ambulatory Visit | Attending: Neurology | Admitting: Neurology

## 2019-12-12 DIAGNOSIS — Z01812 Encounter for preprocedural laboratory examination: Secondary | ICD-10-CM | POA: Insufficient documentation

## 2019-12-12 DIAGNOSIS — Z20822 Contact with and (suspected) exposure to covid-19: Secondary | ICD-10-CM | POA: Diagnosis not present

## 2019-12-12 LAB — SARS CORONAVIRUS 2 (TAT 6-24 HRS): SARS Coronavirus 2: NEGATIVE

## 2019-12-14 ENCOUNTER — Ambulatory Visit: Payer: BC Managed Care – PPO | Attending: Family Medicine | Admitting: Neurology

## 2019-12-14 ENCOUNTER — Other Ambulatory Visit: Payer: Self-pay

## 2019-12-14 DIAGNOSIS — I4891 Unspecified atrial fibrillation: Secondary | ICD-10-CM | POA: Insufficient documentation

## 2019-12-14 DIAGNOSIS — G4733 Obstructive sleep apnea (adult) (pediatric): Secondary | ICD-10-CM | POA: Diagnosis not present

## 2019-12-14 DIAGNOSIS — R0683 Snoring: Secondary | ICD-10-CM | POA: Diagnosis not present

## 2019-12-14 DIAGNOSIS — G4730 Sleep apnea, unspecified: Secondary | ICD-10-CM

## 2019-12-14 DIAGNOSIS — I493 Ventricular premature depolarization: Secondary | ICD-10-CM | POA: Diagnosis not present

## 2019-12-14 DIAGNOSIS — Z79899 Other long term (current) drug therapy: Secondary | ICD-10-CM | POA: Insufficient documentation

## 2019-12-14 DIAGNOSIS — R0681 Apnea, not elsewhere classified: Secondary | ICD-10-CM

## 2019-12-14 DIAGNOSIS — I10 Essential (primary) hypertension: Secondary | ICD-10-CM

## 2019-12-14 DIAGNOSIS — R5383 Other fatigue: Secondary | ICD-10-CM

## 2019-12-20 NOTE — Procedures (Signed)
  [facility] Carney Saxton A. Gerilyn Pilgrim, MD     [email or web address]             NOCTURNAL POLYSOMNOGRAPHY   LOCATION: [facility]   Patient Name: James Rivas, James Rivas Date: 12/14/2019 Gender: Male D.O.B: [birth year] Age (years): 60 Referring Provider: W. Simone Curia Height (inches): 77 Interpreting Physician: Beryle Beams MD, ABSM Weight (lbs): 369 RPSGT: Alfonso Ellis BMI: 44 MRN: [id number] Neck Size: 20.50 CLINICAL INFORMATION Sleep Study Type: NPSG     Indication for sleep study: N/A     Epworth Sleepiness Score: 13     SLEEP STUDY TECHNIQUE As per the AASM Manual for the Scoring of Sleep and Associated Events v2.3 (April 2016) with a hypopnea requiring 4% desaturations.  The channels recorded and monitored were frontal, central and occipital EEG, electrooculogram (EOG), submentalis EMG (chin), nasal and oral airflow, thoracic and abdominal wall motion, anterior tibialis EMG, snore microphone, electrocardiogram, and pulse oximetry.  MEDICATIONS Medications self-administered by patient taken the night of the study : N/A  Current Outpatient Medications:  .  amoxicillin-clavulanate (AUGMENTIN) 875-125 MG tablet, Take 1 tablet by mouth 2 (two) times daily. (Patient not taking: Reported on 11/03/2019), Disp: 20 tablet, Rfl: 0 .  chlorthalidone (HYGROTON) 25 MG tablet, TAKE 1 TABLET(25 MG) BY MOUTH DAILY, Disp: 90 tablet, Rfl: 1 .  enalapril (VASOTEC) 20 MG tablet, TAKE 1 TABLET(20 MG) BY MOUTH DAILY, Disp: 90 tablet, Rfl: 1 .  etodolac (LODINE) 400 MG tablet, TAKE 1 TABLET BY MOUTH TWICE DAILY WITH FOOD, Disp: 42 tablet, Rfl: 0 .  metoprolol tartrate (LOPRESSOR) 25 MG tablet, TAKE 1/2 TABLET(12.5 MG) BY MOUTH TWICE DAILY, Disp: 90 tablet, Rfl: 1     SLEEP ARCHITECTURE The study was initiated at 10:01:20 PM and ended at 5:01:22 AM.  Sleep onset time was 7.2 minutes and the sleep efficiency was 83.2%%. The total sleep time was 349.3 minutes.  Stage REM latency  was 268.0 minutes.  The patient spent 9.7%% of the night in stage N1 sleep, 71.2%% in stage N2 sleep, 5.7%% in stage N3 and 13.3% in REM.  Alpha intrusion was absent.  Supine sleep was 29.63%.  RESPIRATORY PARAMETERS The overall apnea/hypopnea index (AHI) was 42.6 per hour. There were 2 total apneas, including 2 obstructive, 0 central and 0 mixed apneas. There were 246 hypopneas and 16 RERAs.  The AHI during Stage REM sleep was 76.1 per hour.  AHI while supine was 66.7 per hour.  The mean oxygen saturation was 91.1%. The minimum SpO2 during sleep was 77.0%.  loud snoring was noted during this study.  CARDIAC DATA The 2 lead EKG demonstrated sinus rhythm. The mean heart rate was 80.9 beats per minute. Other EKG findings include: Atrial Fibrillation, PVCs.  LEG MOVEMENT DATA The total PLMS were 0 with a resulting PLMS index of 0.0. Associated arousal with leg movement index was 0.0.  IMPRESSIONS 1. Severe obstructive sleep apnea is documented with this recording. AutoPAP 8- 15 is recommended.   Argie Ramming, MD Diplomate, American Board of Sleep Medicine. ELECTRONICALLY SIGNED ON:  12/20/2019, 1:56 PM [facility] PH: [phone]   FX: [phone] ACCREDITED BY THE AMERICAN ACADEMY OF SLEEP MEDICINE

## 2019-12-31 ENCOUNTER — Encounter: Payer: Self-pay | Admitting: Family Medicine

## 2020-01-01 ENCOUNTER — Ambulatory Visit: Payer: BC Managed Care – PPO | Admitting: Family Medicine

## 2020-01-05 ENCOUNTER — Encounter: Payer: Self-pay | Admitting: Family Medicine

## 2020-01-05 ENCOUNTER — Ambulatory Visit (INDEPENDENT_AMBULATORY_CARE_PROVIDER_SITE_OTHER): Payer: BC Managed Care – PPO | Admitting: Family Medicine

## 2020-01-05 ENCOUNTER — Other Ambulatory Visit: Payer: Self-pay

## 2020-01-05 VITALS — BP 138/90 | Temp 97.4°F | Ht 77.0 in | Wt 383.4 lb

## 2020-01-05 DIAGNOSIS — L309 Dermatitis, unspecified: Secondary | ICD-10-CM | POA: Diagnosis not present

## 2020-01-05 DIAGNOSIS — G4733 Obstructive sleep apnea (adult) (pediatric): Secondary | ICD-10-CM

## 2020-01-05 DIAGNOSIS — Z79899 Other long term (current) drug therapy: Secondary | ICD-10-CM

## 2020-01-05 DIAGNOSIS — Z0000 Encounter for general adult medical examination without abnormal findings: Secondary | ICD-10-CM

## 2020-01-05 DIAGNOSIS — R634 Abnormal weight loss: Secondary | ICD-10-CM

## 2020-01-05 DIAGNOSIS — R5383 Other fatigue: Secondary | ICD-10-CM

## 2020-01-05 DIAGNOSIS — Z1211 Encounter for screening for malignant neoplasm of colon: Secondary | ICD-10-CM

## 2020-01-05 DIAGNOSIS — L851 Acquired keratosis [keratoderma] palmaris et plantaris: Secondary | ICD-10-CM | POA: Diagnosis not present

## 2020-01-05 DIAGNOSIS — I10 Essential (primary) hypertension: Secondary | ICD-10-CM | POA: Diagnosis not present

## 2020-01-05 DIAGNOSIS — Z125 Encounter for screening for malignant neoplasm of prostate: Secondary | ICD-10-CM | POA: Diagnosis not present

## 2020-01-05 DIAGNOSIS — B351 Tinea unguium: Secondary | ICD-10-CM | POA: Diagnosis not present

## 2020-01-05 DIAGNOSIS — R234 Changes in skin texture: Secondary | ICD-10-CM | POA: Diagnosis not present

## 2020-01-05 MED ORDER — CHLORTHALIDONE 25 MG PO TABS: ORAL_TABLET | ORAL | 1 refills | Status: DC

## 2020-01-05 MED ORDER — METOPROLOL TARTRATE 25 MG PO TABS: ORAL_TABLET | ORAL | 1 refills | Status: DC

## 2020-01-05 MED ORDER — ENALAPRIL MALEATE 20 MG PO TABS: ORAL_TABLET | ORAL | 1 refills | Status: DC

## 2020-01-05 NOTE — Progress Notes (Signed)
   Subjective:    Patient ID: James Rivas, male    DOB: [birth year], 49 y.o.   MRN: [id number]  HPI The patient comes in today for a wellness visit.    A review of their health history was completed.  A review of medications was also completed.  Any needed refills; none at this time  Eating habits: good  Falls/  MVA accidents in past few months: none  Regular exercise: gets exercise at work  Specialist pt sees on regular basis: none  Preventative health issues were discussed.   Additional concerns:   Review of Systems  Constitutional: Negative for activity change, appetite change and fever.  HENT: Negative for congestion and rhinorrhea.   Eyes: Negative for discharge.  Respiratory: Negative for cough and wheezing.   Cardiovascular: Negative for chest pain.  Gastrointestinal: Negative for abdominal pain, blood in stool and vomiting.  Genitourinary: Negative for difficulty urinating and frequency.  Musculoskeletal: Negative for neck pain.  Skin: Negative for rash.  Allergic/Immunologic: Negative for environmental allergies and food allergies.  Neurological: Negative for weakness and headaches.  Psychiatric/Behavioral: Negative for agitation.  All other systems reviewed and are negative.      Objective:   Physical Exam Vitals reviewed.  Constitutional:      Appearance: He is well-developed.  HENT:     Head: Normocephalic and atraumatic.     Right Ear: External ear normal.     Left Ear: External ear normal.     Nose: Nose normal.  Eyes:     Pupils: Pupils are equal, round, and reactive to light.  Neck:     Thyroid: No thyromegaly.  Cardiovascular:     Rate and Rhythm: Normal rate and regular rhythm.     Heart sounds: Normal heart sounds. No murmur.  Pulmonary:     Effort: Pulmonary effort is normal. No respiratory distress.     Breath sounds: Normal breath sounds. No wheezing.  Abdominal:     General: Bowel sounds are normal. There is no distension.   Palpations: Abdomen is soft. There is no mass.     Tenderness: There is no abdominal tenderness.  Genitourinary:    Penis: Normal.   Musculoskeletal:        General: Normal range of motion.     Cervical back: Normal range of motion and neck supple.  Lymphadenopathy:     Cervical: No cervical adenopathy.  Skin:    General: Skin is warm and dry.     Findings: No erythema.  Neurological:     Mental Status: He is alert.     Motor: No abnormal muscle tone.  Psychiatric:        Behavior: Behavior normal.        Judgment: Judgment normal.           Assessment & Plan:  Impression wellness exam.  Patient is morbidly obese.  Has been resistant to bariatric referrals in the past.  At this time willing to go see a dietitian.  He realizes he is dangerously overweight.  Colonoscopy referral.  Declines flu shot.  Preventive blood work.  2.  Sleep apnea.  Sleep study shows severe sleep apnea.  Greater than 40 events per hour.  Discussion held.  Definitely time to get on sleep apnea management rationale discussed  All medications refilled follow-up in 6 months.  Appropriate blood work.  Referral for both colonoscopy and dietitian.

## 2020-01-06 LAB — BASIC METABOLIC PANEL
BUN/Creatinine Ratio: 19 (ref 9–20)
BUN: 20 mg/dL (ref 6–24)
CO2: 22 mmol/L (ref 20–29)
Calcium: 9.8 mg/dL (ref 8.7–10.2)
Chloride: 101 mmol/L (ref 96–106)
Creatinine, Ser: 1.05 mg/dL (ref 0.76–1.27)
GFR calc Af Amer: 95 mL/min/{1.73_m2} (ref >59)
GFR calc non Af Amer: 82 mL/min/{1.73_m2} (ref >59)
Glucose: 99 mg/dL (ref 65–99)
Potassium: 4.1 mmol/L (ref 3.5–5.2)
Sodium: 136 mmol/L (ref 134–144)

## 2020-01-06 LAB — LIPID PANEL
Chol/HDL Ratio: 3.9 ratio (ref 0.0–5.0)
Cholesterol, Total: 165 mg/dL (ref 100–199)
HDL: 42 mg/dL (ref >39)
LDL Chol Calc (NIH): 108 mg/dL — ABNORMAL HIGH (ref 0–99)
Triglycerides: 79 mg/dL (ref 0–149)
VLDL Cholesterol Cal: 15 mg/dL (ref 5–40)

## 2020-01-06 LAB — HEPATIC FUNCTION PANEL
ALT: 33 IU/L (ref 0–44)
AST: 27 IU/L (ref 0–40)
Albumin: 4.3 g/dL (ref 4.0–5.0)
Alkaline Phosphatase: 56 IU/L (ref 39–117)
Bilirubin Total: 0.4 mg/dL (ref 0.0–1.2)
Bilirubin, Direct: 0.11 mg/dL (ref 0.00–0.40)
Total Protein: 7.3 g/dL (ref 6.0–8.5)

## 2020-01-06 LAB — TSH: TSH: 1.16 u[IU]/mL (ref 0.450–4.500)

## 2020-01-06 LAB — PSA: Prostate Specific Ag, Serum: 0.1 ng/mL (ref 0.0–4.0)

## 2020-01-11 ENCOUNTER — Encounter: Payer: Self-pay | Admitting: Family Medicine

## 2020-01-14 DIAGNOSIS — G4733 Obstructive sleep apnea (adult) (pediatric): Secondary | ICD-10-CM | POA: Diagnosis not present

## 2020-01-21 ENCOUNTER — Encounter: Payer: Self-pay | Admitting: Family Medicine

## 2020-01-21 ENCOUNTER — Other Ambulatory Visit: Payer: Self-pay

## 2020-01-21 ENCOUNTER — Ambulatory Visit (INDEPENDENT_AMBULATORY_CARE_PROVIDER_SITE_OTHER): Payer: BC Managed Care – PPO | Admitting: Family Medicine

## 2020-01-21 VITALS — BP 128/82 | Temp 97.9°F | Wt 378.2 lb

## 2020-01-21 DIAGNOSIS — M25511 Pain in right shoulder: Secondary | ICD-10-CM | POA: Diagnosis not present

## 2020-01-21 MED ORDER — TIZANIDINE HCL 4 MG PO TABS: 4.0000 mg | ORAL_TABLET | Freq: Four times a day (QID) | ORAL | 0 refills | Status: DC | PRN

## 2020-01-21 NOTE — Progress Notes (Signed)
   Subjective:    Patient ID: James Rivas, male    DOB: 10/31/1971, 48 y.o.   MRN: [id number]  HPI  Patient arrives with sharp pain in his neck and in his right shoulder since Monday. Patient recalls no known injury and states it bothers him when he is trying to sleep.  Moving an exrsice heavy package  Kicked in mon  And Tuesday   ibu helped a bit  Got off work yest and ls night kicked in and affected sleep     worse with a deep breath    Review of Systems No shortness of breath no abdominal pain no nausea no diaphoresis no chest pain    Objective:   Physical Exam  Alert active no acute distress shoulder some pain with full range of motion some superior posterior shoulder tenderness to palpation negative chest wall tenderness lungs clear.  Heart regular rate and rhythm.  Blood pressure excellent      Assessment & Plan:  Impression shoulder/anterior chest wall strain per history with element of spasm.  Ibuprofen 800 3 times daily.  Add Zanaflex as needed symptom care discussed warning signs discussed.  Highly doubt any serious etiology though quite painful

## 2020-01-28 ENCOUNTER — Encounter: Payer: Self-pay | Admitting: Internal Medicine

## 2020-02-11 DIAGNOSIS — G4733 Obstructive sleep apnea (adult) (pediatric): Secondary | ICD-10-CM | POA: Diagnosis not present

## 2020-02-23 ENCOUNTER — Other Ambulatory Visit: Payer: Self-pay

## 2020-02-23 ENCOUNTER — Telehealth: Payer: Self-pay | Admitting: "Endocrinology

## 2020-02-23 ENCOUNTER — Encounter: Payer: Self-pay | Admitting: Nutrition

## 2020-02-23 ENCOUNTER — Encounter: Payer: BC Managed Care – PPO | Attending: Family Medicine | Admitting: Nutrition

## 2020-02-23 NOTE — Progress Notes (Signed)
  Medical Nutrition Therapy:  Appt start time: 1430 end time:  1530.   Assessment:  Primary concerns today: Morbid Obesity. He is working 6-12 hr days. He is really tired. Hasn't been able to exercise much. Wants to lose weight. Eats a home and eats out some. His wife cooks fairly healthy at home. Admits to snacking and eating later at night. Skips breakfast. Has been trying to eat less but is gaining weight. Gained 25 lbs in the last year. Wants to get his weigh off, feel better and prevent DM. No A1C available. LDL high at 108 mg/dl. CMP Latest Ref Rng & Units 01/05/2020 01/11/2019 06/14/2018  Glucose 65 - 99 mg/dL 99 99 98  BUN 6 - 24 mg/dL 20 17 16   Creatinine 0.76 - 1.27 mg/dL 8.03 2.12  Sodium 134 - 144 mmol/L 136 139 138  Potassium 3.5 - 5.2 mmol/L 4.1 4.1 3.9  Chloride 96 - 106 mmol/L 101 100 102  CO2 20 - 29 mmol/L 22 26 29   Calcium 8.7 - 10.2 mg/dL 9.8 9.7 9.3  Total Protein 6.0 - 8.5 g/dL 7.3 7.5 -  Total Bilirubin 0.0 - 1.2 mg/dL 0.4 0.5 -  Alkaline Phos 39 - 117 IU/L 56 59 -  AST 0 - 40 IU/L 27 21 -  ALT 0 - 44 IU/L 33 24 -    Preferred Learning Style:    No preference indicated   Learning Readiness:   Ready  Change in progress   MEDICATIONS:    DIETARY INTAKE:    24-hr recall:  Works 3 a to 3 p. Eats 2-3 meals per day. Drinks some soda or tea at times. Snacks some. Admits to need to watch his portions.   Usual physical activity: walks some on weekends.   Estimated energy needs: 2000 calories 225 g carbohydrates 150 g protein 56 g fat  Progress Towards Goal(s):  In progress.   Nutritional Diagnosis:  NB-1.1 Food and nutrition-related knowledge deficit As related to Obesity.  As evidenced by BMI 46.    Intervention:  Nutrition and Diabetes education provided on My Plate, CHO counting, meal planning, portion sizes, timing of meals, avoiding snacks between meals , taking medications as prescribed, benefits of exercising 30 minutes per day and  prevention of  DM. 2.48 Goals  Follow MY Plate Eat three meals per day Watch portion sizes Increase lower carb vegetables. Avoid snacking 30 minutes of treadmil.. 3 times per week. Lose 1-2 lbs per week.  Teaching Method Utilized:   Visual Auditory Hands on  Handouts given during visit include:  The Plate Method    Meal Plan Card   Barriers to learning/adherence to lifestyle change: none  Demonstrated degree of understanding via:  Teach Back   Monitoring/Evaluation:  Dietary intake, exercise, , and body weight in 1 month(s).

## 2020-02-23 NOTE — Patient Instructions (Signed)
Goals  Follow MY Plate Eat three meals per day Watch portion sizes Increase lower carb vegetables. Avoid snacking 30 minutes of treadmil.. 3 times per week. Lose 1-2 lbs per week.

## 2020-02-26 DIAGNOSIS — Z23 Encounter for immunization: Secondary | ICD-10-CM | POA: Diagnosis not present

## 2020-03-13 DIAGNOSIS — G4733 Obstructive sleep apnea (adult) (pediatric): Secondary | ICD-10-CM | POA: Diagnosis not present

## 2020-03-19 ENCOUNTER — Other Ambulatory Visit: Payer: Self-pay | Admitting: Podiatry

## 2020-03-19 ENCOUNTER — Other Ambulatory Visit: Payer: Self-pay

## 2020-03-19 ENCOUNTER — Ambulatory Visit: Payer: BC Managed Care – PPO | Admitting: Podiatry

## 2020-03-19 ENCOUNTER — Ambulatory Visit (INDEPENDENT_AMBULATORY_CARE_PROVIDER_SITE_OTHER): Payer: BC Managed Care – PPO

## 2020-03-19 DIAGNOSIS — M79674 Pain in right toe(s): Secondary | ICD-10-CM | POA: Diagnosis not present

## 2020-03-19 DIAGNOSIS — L989 Disorder of the skin and subcutaneous tissue, unspecified: Secondary | ICD-10-CM

## 2020-03-19 DIAGNOSIS — Z23 Encounter for immunization: Secondary | ICD-10-CM | POA: Diagnosis not present

## 2020-03-19 DIAGNOSIS — M79671 Pain in right foot: Secondary | ICD-10-CM

## 2020-03-19 DIAGNOSIS — B351 Tinea unguium: Secondary | ICD-10-CM | POA: Diagnosis not present

## 2020-03-19 DIAGNOSIS — M79675 Pain in left toe(s): Secondary | ICD-10-CM

## 2020-03-19 DIAGNOSIS — M79672 Pain in left foot: Secondary | ICD-10-CM

## 2020-03-19 DIAGNOSIS — M779 Enthesopathy, unspecified: Secondary | ICD-10-CM

## 2020-03-21 ENCOUNTER — Encounter: Payer: Self-pay | Admitting: Gastroenterology

## 2020-03-21 NOTE — Progress Notes (Signed)
Referring Provider: Mikey Kirschner, MD Primary Care Physician:  Mikey Kirschner, MD Primary Gastroenterologist:  Dr. Gala Romney  Chief Complaint  Patient presents with  . Consult    TCS never done prior. No FH colon cancer/polyps    HPI:   James Rivas is a 49 y.o. male presenting today at the request of Luking, Grace Bushy, MD for colon cancer screening. Medical history as per below.   Today:  No prior TCS. No GI concers. No abdominal pain. BMs daily. No constipation or diarrhea. No brbpr or black stools. No nausea or vomiting. No GERD symptoms or dysphagia. No trouble with anesthesia in the past.   No FH of colon cancer.   Past Medical History:  Diagnosis Date  . Allergy   . Chest pain   . HTN (hypertension)   . Impaired fasting glucose   . Morbid obesity ([place])   . Sleep apnea    uses a CPAP  . Venous stasis     Past Surgical History:  Procedure Laterality Date  . FINGER SURGERY Left    left ring finger 2 surgeries  . I & D EXTREMITY  11/29/2011   Procedure: IRRIGATION AND DEBRIDEMENT EXTREMITY;  Surgeon: Linna Hoff;  Location: [facility];  Service: Orthopedics;  Laterality: Left;  I&D Right Long and Index Fingers with Tendon Repair as Needed    Current Outpatient Medications  Medication Sig Dispense Refill  . chlorthalidone (HYGROTON) 25 MG tablet TAKE 1 TABLET(25 MG) BY MOUTH DAILY 90 tablet 1  . enalapril (VASOTEC) 20 MG tablet TAKE 1 TABLET(20 MG) BY MOUTH DAILY 90 tablet 1  . metoprolol tartrate (LOPRESSOR) 25 MG tablet TAKE 1/2 TABLET(12.5 MG) BY MOUTH TWICE DAILY 90 tablet 1  . tiZANidine (ZANAFLEX) 4 MG tablet Take 1 tablet (4 mg total) by mouth every 6 (six) hours as needed for muscle spasms. 30 tablet 0   No current facility-administered medications for this visit.    Allergies as of 03/22/2020  . (No Known Allergies)    Family History  Problem Relation Age of Onset  . Diabetes Father   . Diabetes Paternal Aunt   . Colon cancer Neg Hx     Social  History   Socioeconomic History  . Marital status: Married    Spouse name: Not on file  . Number of children: Not on file  . Years of education: Not on file  . Highest education level: Not on file  Occupational History  . Not on file  Tobacco Use  . Smoking status: Never Smoker  . Smokeless tobacco: Never Used  Substance and Sexual Activity  . Alcohol use: Yes    Comment: occ. 1 beer or a glass of wine a couple times a week.   . Drug use: No  . Sexual activity: Never  Other Topics Concern  . Not on file  Social History Narrative  . Not on file   Social Determinants of Health   Financial Resource Strain:   . Difficulty of Paying Living Expenses:   Food Insecurity:   . Worried About Charity fundraiser in the Last Year:   . Arboriculturist in the Last Year:   Transportation Needs:   . Film/video editor (Medical):   Marland Kitchen Lack of Transportation (Non-Medical):   Physical Activity:   . Days of Exercise per Week:   . Minutes of Exercise per Session:   Stress:   . Feeling of Stress :   Social  Connections:   . Frequency of Communication with Friends and Family:   . Frequency of Social Gatherings with Friends and Family:   . Attends Religious Services:   . Active Member of Clubs or Organizations:   . Attends Banker Meetings:   Marland Kitchen Marital Status:   Intimate Partner Violence:   . Fear of Current or Ex-Partner:   . Emotionally Abused:   Marland Kitchen Physically Abused:   . Sexually Abused:     Review of Systems: Gen: Denies any fever, chills, cold or flulike symptoms, lightheadedness, dizziness, presyncope, syncope. CV: Denies chest pain or heart palpitations Resp: Denies shortness of breath or cough. GI: See HPI GU : Denies urinary burning, urinary frequency, urinary hesitancy Derm: Denies rash Psych: Denies depression or anxiety Heme: See HPI  Physical Exam: BP (!) 152/84   Pulse 91   Temp (!) 97.1 F (36.2 C) (Oral)   Ht 6\' 5"  (1.956 m)   Wt (!) 391 lb  12.8 oz (177.7 kg)   BMI 46.46 kg/m  General:   Alert and oriented. Pleasant and cooperative. Well-nourished and well-developed.  Morbidly obese. Head:  Normocephalic and atraumatic. Eyes:  Without icterus, sclera clear and conjunctiva pink.  Ears:  Normal auditory acuity. Lungs:  Clear to auscultation bilaterally. No wheezes, rales, or rhonchi. No distress.  Heart:  S1, S2 present without murmurs appreciated.  Abdomen:  +BS, soft, non-tender and non-distended. No HSM noted. No guarding or rebound. No masses appreciated.  Rectal:  Deferred  Msk:  Symmetrical without gross deformities. Normal posture. Extremities:  With 2+ bilateral lower extremity pitting edema. Neurologic:  Alert and  oriented x4;  grossly normal neurologically. Skin: Chronic venous stasis changes in the lower extremities. Psych: Normal mood and affect.

## 2020-03-22 ENCOUNTER — Ambulatory Visit (INDEPENDENT_AMBULATORY_CARE_PROVIDER_SITE_OTHER): Payer: BC Managed Care – PPO | Admitting: Gastroenterology

## 2020-03-22 ENCOUNTER — Encounter: Payer: Self-pay | Admitting: Gastroenterology

## 2020-03-22 ENCOUNTER — Other Ambulatory Visit: Payer: Self-pay

## 2020-03-22 DIAGNOSIS — Z1211 Encounter for screening for malignant neoplasm of colon: Secondary | ICD-10-CM

## 2020-03-22 NOTE — Assessment & Plan Note (Signed)
49 year old male with history significant for HTN, sleep apnea on CPAP, and morbid obesity presenting to schedule first ever colonoscopy.  He is without any significant upper or lower GI symptoms.  No alarm symptoms.  No family history of colon cancer.  Proceed with TCS with propofol with Dr. Jena Gauss in the near future. The risks, benefits, and alternatives have been discussed in detail with patient. They have stated understanding and desire to proceed.  Propofol due to BMI 46 and sleep apnea. As he is without any significant GI symptoms, will follow-up as recommended at the time of colonoscopy.

## 2020-03-22 NOTE — Patient Instructions (Signed)
We will get you scheduled for colonoscopy in the near future with Dr. Jena Gauss.  As you are not having any GI concerns at this time, we will plan to see you back as recommended at the time of your colonoscopy.  Should you have any questions or concerns, do not hesitate to call.  Ermalinda Memos, PA-C [facility]

## 2020-03-23 NOTE — Progress Notes (Signed)
    Subjective: Patient is a 49 y.o. male presenting to the office today as a new patient with a chief complaint of painful callus lesion(s) noted to the bilateral feet that has been ongoing for the past year. He describes the pain as throbbing, stabbing and burning. Walking and bearing weight increases the pain. He has had the calluses trimmed down by another physician in the past.  Patient also complains of elongated, thickened nails that cause pain while ambulating in shoes. He is unable to trim his own nails. Patient presents today for further treatment and evaluation.  Past Medical History:  Diagnosis Date  . Allergy   . Chest pain   . HTN (hypertension)   . Impaired fasting glucose   . Morbid obesity ([facility])   . Sleep apnea    uses a CPAP  . Venous stasis     Objective:  Physical Exam General: Alert and oriented x3 in no acute distress  Dermatology: Hyperkeratotic lesion(s) present on the bilateral feet. Pain on palpation with a central nucleated core noted. Skin is warm, dry and supple bilateral lower extremities. Negative for open lesions or macerations. Nails are tender, long, thickened and dystrophic with subungual debris, consistent with onychomycosis, 1-5 bilateral. No signs of infection noted.  Vascular: Palpable pedal pulses bilaterally. No edema or erythema noted. Capillary refill within normal limits.  Neurological: Epicritic and protective threshold grossly intact bilaterally.   Musculoskeletal Exam: Pain on palpation at the keratotic lesion(s) noted. Range of motion within normal limits bilateral. Muscle strength 5/5 in all groups bilateral.  Radiographic Exam:  Normal osseous mineralization. Joint spaces preserved. No fracture/dislocation/boney destruction.    Assessment: 1. Onychodystrophic nails 1-5 bilateral with hyperkeratosis of nails.  2. Onychomycosis of nail due to dermatophyte bilateral 3. Pre-ulcerative callus lesions noted to the bilateral feet x 5 4.  Bilateral foot pain    Plan of Care:  1. Patient evaluated. X-Rays reviewed.  2. Excisional debridement of keratoic lesion(s) using a chisel blade was performed without incident.  3. Dressed with light dressing. 4. Mechanical debridement of nails 1-5 bilaterally performed using a nail nipper. Filed with dremel without incident.  5. Appointment with Pedorthist for custom molded orthotics.  6. Prescription for Lamisil 250 mg #90 provided to patient.  7. Inquired about liver function and patient declines any known history of issue or problems.  8. Revitaderm lotion dispensed.  9. Patient is to return to the clinic as needed.   Felecia Shelling, DPM [facility]  Dr. Felecia Shelling, DPM    [address]                                        [place], [place] [address]                Office [phone]  Fax [phone]

## 2020-03-25 ENCOUNTER — Telehealth: Payer: Self-pay

## 2020-03-25 ENCOUNTER — Other Ambulatory Visit: Payer: Self-pay

## 2020-03-25 MED ORDER — TERBINAFINE HCL 250 MG PO TABS: 250.0000 mg | ORAL_TABLET | Freq: Every day | ORAL | 0 refills | Status: DC

## 2020-03-25 NOTE — Telephone Encounter (Signed)
Rx for Lamisil has been sent to pharmacy and patient has been notified

## 2020-03-25 NOTE — Telephone Encounter (Signed)
-----   Message from [facility] sent at 03/25/2020  2:16 PM EDT ----- Regarding: MEDICATION Patient's wife called in about a RX that Dr. Logan Bores prescribed on 03/19/20. They went to the Rx to pick it up, they were told that they didn't have a script at the pharmacy is [facility] in [place] off of [address]. Phone # is [phone]

## 2020-03-30 ENCOUNTER — Other Ambulatory Visit: Payer: Self-pay

## 2020-03-30 ENCOUNTER — Ambulatory Visit: Payer: BC Managed Care – PPO | Admitting: Family Medicine

## 2020-03-30 ENCOUNTER — Encounter: Payer: Self-pay | Admitting: Family Medicine

## 2020-03-30 VITALS — BP 140/86 | Temp 97.5°F | Ht 77.0 in | Wt 386.2 lb

## 2020-03-30 DIAGNOSIS — M79662 Pain in left lower leg: Secondary | ICD-10-CM

## 2020-03-30 MED ORDER — ETODOLAC 400 MG PO TABS: 400.0000 mg | ORAL_TABLET | Freq: Two times a day (BID) | ORAL | 0 refills | Status: DC

## 2020-03-30 MED ORDER — DOXYCYCLINE HYCLATE 100 MG PO TABS: 100.0000 mg | ORAL_TABLET | Freq: Two times a day (BID) | ORAL | 0 refills | Status: DC

## 2020-03-30 NOTE — Progress Notes (Signed)
   Subjective:    Patient ID: James Rivas, male    DOB: [birth year], 49 y.o.   MRN: [id number]  HPI  Patient arrives with left leg pain since yesterday.  yest developed very severe pain in the leg  Today still hurting   Deep in the left calf  Feels teder and sore    Review of Systems No headache no chest pain no shortness of breath    Objective:   Physical Exam Alert no acute distress.  Lungs clear.  Heart regular rate and rhythm.  Legs bilateral substantial venous stasis changes and varices posterior left calf tender to deep palpation       Assessment & Plan:  Impression cellulitis versus DVT plan Doxy 100 twice daily.  Lodine twice daily as needed for pain.  D-dimer and leg ultrasound.  Rationale discussed with patient

## 2020-03-31 ENCOUNTER — Ambulatory Visit: Payer: BC Managed Care – PPO | Admitting: Nutrition

## 2020-03-31 ENCOUNTER — Ambulatory Visit (HOSPITAL_COMMUNITY)
Admission: RE | Admit: 2020-03-31 | Discharge: 2020-03-31 | Disposition: A | Discharge status: Home / Self Care | Payer: BC Managed Care – PPO | Source: Ambulatory Visit | Attending: Family Medicine | Admitting: Family Medicine

## 2020-03-31 ENCOUNTER — Other Ambulatory Visit (HOSPITAL_COMMUNITY)
Admission: RE | Admit: 2020-03-31 | Discharge: 2020-03-31 | Disposition: A | Discharge status: Home / Self Care | Payer: BC Managed Care – PPO | Source: Ambulatory Visit | Attending: Family Medicine | Admitting: Family Medicine

## 2020-03-31 DIAGNOSIS — M79662 Pain in left lower leg: Secondary | ICD-10-CM | POA: Diagnosis not present

## 2020-03-31 LAB — D-DIMER, QUANTITATIVE: D-Dimer, Quant: 0.48 ug/mL-FEU (ref 0.00–0.50)

## 2020-04-12 DIAGNOSIS — G4733 Obstructive sleep apnea (adult) (pediatric): Secondary | ICD-10-CM | POA: Diagnosis not present

## 2020-04-14 ENCOUNTER — Other Ambulatory Visit: Payer: BC Managed Care – PPO | Admitting: Orthotics

## 2020-04-19 ENCOUNTER — Other Ambulatory Visit: Payer: Self-pay

## 2020-04-19 ENCOUNTER — Encounter: Payer: Self-pay | Admitting: Cardiovascular Disease

## 2020-04-19 ENCOUNTER — Ambulatory Visit (INDEPENDENT_AMBULATORY_CARE_PROVIDER_SITE_OTHER): Payer: BC Managed Care – PPO | Admitting: Cardiovascular Disease

## 2020-04-19 VITALS — HR 92 | Ht 77.0 in | Wt 394.2 lb

## 2020-04-19 DIAGNOSIS — I10 Essential (primary) hypertension: Secondary | ICD-10-CM

## 2020-04-19 DIAGNOSIS — R002 Palpitations: Secondary | ICD-10-CM

## 2020-04-19 DIAGNOSIS — I493 Ventricular premature depolarization: Secondary | ICD-10-CM | POA: Diagnosis not present

## 2020-04-19 NOTE — Patient Instructions (Signed)

## 2020-04-19 NOTE — Progress Notes (Signed)
SUBJECTIVE: The patient presents for follow-up of palpitations.  Event monitoring in September 2019 demonstrated sinus rhythm with isolated PVCs, couplets, and shorts runs of NSVT up to 4 beats.  He was started on Lopressor 12.5 mg bid.  Echocardiogram on 08/14/18 was normal, LVEF 60-65%.  The patient denies any symptoms of chest pain, palpitations, shortness of breath, lightheadedness, dizziness, leg swelling, orthopnea, PND, and syncope.   He is a Administrator.  Social history: His eldest son graduated from [employer] and works for [employer] in [place].  His daughter is on the Dean's list at [employer].  His youngest son plays [employer] basketball and wants to play in college.   Review of Systems: As per "subjective", otherwise negative.  No Known Allergies  Current Outpatient Medications  Medication Sig Dispense Refill  . chlorthalidone (HYGROTON) 25 MG tablet TAKE 1 TABLET(25 MG) BY MOUTH DAILY 90 tablet 1  . enalapril (VASOTEC) 20 MG tablet TAKE 1 TABLET(20 MG) BY MOUTH DAILY 90 tablet 1  . etodolac (LODINE) 400 MG tablet Take 400 mg by mouth as needed.    . metoprolol tartrate (LOPRESSOR) 25 MG tablet TAKE 1/2 TABLET(12.5 MG) BY MOUTH TWICE DAILY 90 tablet 1  . terbinafine (LAMISIL) 250 MG tablet Take 1 tablet (250 mg total) by mouth daily. 90 tablet 0  . tiZANidine (ZANAFLEX) 4 MG tablet Take 1 tablet (4 mg total) by mouth every 6 (six) hours as needed for muscle spasms. 30 tablet 0   No current facility-administered medications for this visit.    Past Medical History:  Diagnosis Date  . Allergy   . Chest pain   . HTN (hypertension)   . Impaired fasting glucose   . Morbid obesity ([place])   . Sleep apnea    uses a CPAP  . Venous stasis     Past Surgical History:  Procedure Laterality Date  . FINGER SURGERY Left    left ring finger 2 surgeries  . I & D EXTREMITY  11/29/2011   Procedure: IRRIGATION AND DEBRIDEMENT EXTREMITY;  Surgeon:  Linna Hoff;  Location: [place];  Service: Orthopedics;  Laterality: Left;  I&D Right Long and Index Fingers with Tendon Repair as Needed    Social History   Socioeconomic History  . Marital status: Married    Spouse name: Not on file  . Number of children: Not on file  . Years of education: Not on file  . Highest education level: Not on file  Occupational History  . Not on file  Tobacco Use  . Smoking status: Never Smoker  . Smokeless tobacco: Never Used  Substance and Sexual Activity  . Alcohol use: Yes    Comment: occ. 1 beer or a glass of wine a couple times a week.   . Drug use: No  . Sexual activity: Never  Other Topics Concern  . Not on file  Social History Narrative  . Not on file   Social Determinants of Health   Financial Resource Strain:   . Difficulty of Paying Living Expenses:   Food Insecurity:   . Worried About Charity fundraiser in the Last Year:   . Arboriculturist in the Last Year:   Transportation Needs:   . Film/video editor (Medical):   Marland Kitchen Lack of Transportation (Non-Medical):   Physical Activity:   . Days of Exercise per Week:   . Minutes of Exercise per Session:   Stress:   .  Feeling of Stress :   Social Connections:   . Frequency of Communication with Friends and Family:   . Frequency of Social Gatherings with Friends and Family:   . Attends Religious Services:   . Active Member of Clubs or Organizations:   . Attends Banker Meetings:   Marland Kitchen Marital Status:   Intimate Partner Violence:   . Fear of Current or Ex-Partner:   . Emotionally Abused:   Marland Kitchen Physically Abused:   . Sexually Abused:      Vitals:   04/19/20 1545  Pulse: 92  SpO2: 96%  Weight: (!) 394 lb 3.2 oz (178.8 kg)  Height: 6\' 5"  (1.956 m)    Wt Readings from Last 3 Encounters:  04/19/20 (!) 394 lb 3.2 oz (178.8 kg)  03/30/20 (!) 386 lb 3.2 oz (175.2 kg)  03/22/20 (!) 391 lb 12.8 oz (177.7 kg)     PHYSICAL EXAM General: Morbidly obese male in  NAD HEENT: Normal. Neck: No JVD, no thyromegaly. Lungs: Clear to auscultation bilaterally with normal respiratory effort. CV: Regular rate and rhythm, normal S1/S2, no S3/S4, no murmur. No pretibial or periankle edema.   Abdomen: Soft, nontender, obese.  Neurologic: Alert and oriented.  Psych: Normal affect. Skin: Normal. Musculoskeletal: No gross deformities.    ECG: Reviewed above under Subjective   Labs: Lab Results  Component Value Date/Time   K 4.1 01/05/2020 11:24 AM   BUN 20 01/05/2020 11:24 AM   CREATININE 1.05 01/05/2020 11:24 AM   CREATININE 0.98 09/26/2014 10:09 AM   ALT 33 01/05/2020 11:24 AM   TSH 1.160 01/05/2020 11:24 AM   HGB 15.2 06/14/2018 04:18 PM     Lipids: Lab Results  Component Value Date/Time   LDLCALC 108 (H) 01/05/2020 11:24 AM   CHOL 165 01/05/2020 11:24 AM   TRIG 79 01/05/2020 11:24 AM   HDL 42 01/05/2020 11:24 AM       ASSESSMENT AND PLAN: 1. Palpitations/NSVT: Symptomatically stable on Lopressor 12.5 mg bid. No changes. Cardiac structure and function is normal by echocardiogram in Sept 2019.  2. HTN: SBP in 130 range at most recent PCP office visit within the past few weeks. No changes. On chlorthalidone 25 mg, enalapril 20 mg, and Lopressor 12.5 mg bid.  3.  Morbid obesity: He needs significant weight loss.   Disposition: Follow up 1 year.   April 22, M.D., F.A.C.C.

## 2020-05-03 ENCOUNTER — Ambulatory Visit
Admission: EM | Admit: 2020-05-03 | Discharge: 2020-05-03 | Disposition: A | Discharge status: Home / Self Care | Payer: BC Managed Care – PPO | Attending: Emergency Medicine | Admitting: Emergency Medicine

## 2020-05-03 ENCOUNTER — Other Ambulatory Visit: Payer: Self-pay

## 2020-05-03 DIAGNOSIS — M545 Low back pain, unspecified: Secondary | ICD-10-CM

## 2020-05-03 DIAGNOSIS — M62830 Muscle spasm of back: Secondary | ICD-10-CM

## 2020-05-03 MED ORDER — CYCLOBENZAPRINE HCL 10 MG PO TABS: 10.0000 mg | ORAL_TABLET | Freq: Every day | ORAL | 0 refills | Status: DC

## 2020-05-03 NOTE — Discharge Instructions (Signed)
Continue conservative management of rest, ice, and gentle stretches Take cyclobenzaprine at nighttime for symptomatic relief. Avoid driving or operating heavy machinery while using medication. Follow up with PCP if symptoms persist Return or go to the ER if you have any new or worsening symptoms (fever, chills, chest pain, abdominal pain, changes in bowel or bladder habits, pain radiating into lower legs, etc...)

## 2020-05-03 NOTE — ED Triage Notes (Signed)
Pt presents with c/o right side lower back pain. Woke up having spasms yesterday. Denies injury

## 2020-05-03 NOTE — ED Provider Notes (Signed)
[facility]   [id number] 05/03/20 Arrival Time: 7253  CC: Back PAIN  SUBJECTIVE: History from: patient. James Rivas is a 49 y.o. male complains of RT low back x 1 day.  Denies a precipitating event or specific injury.  Localizes the pain to the RT low back.  Describes the pain as intermittent and spasm in character.  Has tried OTC medications without relief.  Symptoms are made worse with movement.  Denies similar symptoms in the past.  Denies fever, chills, erythema, ecchymosis, effusion, weakness, numbness and tingling, saddle paresthesias, loss of bowel or bladder function.      ROS: As per HPI.  All other pertinent ROS negative.     Past Medical History:  Diagnosis Date   Allergy    Chest pain    HTN (hypertension)    Impaired fasting glucose    Morbid obesity ([place])    Sleep apnea    uses a CPAP   Venous stasis    Past Surgical History:  Procedure Laterality Date   FINGER SURGERY Left    left ring finger 2 surgeries   I & D EXTREMITY  11/29/2011   Procedure: IRRIGATION AND DEBRIDEMENT EXTREMITY;  Surgeon: Linna Hoff;  Location: [place];  Service: Orthopedics;  Laterality: Left;  I&D Right Long and Index Fingers with Tendon Repair as Needed   No Known Allergies No current facility-administered medications on file prior to encounter.   Current Outpatient Medications on File Prior to Encounter  Medication Sig Dispense Refill   chlorthalidone (HYGROTON) 25 MG tablet TAKE 1 TABLET(25 MG) BY MOUTH DAILY (Patient taking differently: Take 25 mg by mouth daily. TAKE 1 TABLET(25 MG) BY MOUTH DAILY) 90 tablet 1   enalapril (VASOTEC) 20 MG tablet TAKE 1 TABLET(20 MG) BY MOUTH DAILY (Patient taking differently: Take 20 mg by mouth daily. TAKE 1 TABLET(20 MG) BY MOUTH DAILY) 90 tablet 1   metoprolol tartrate (LOPRESSOR) 25 MG tablet TAKE 1/2 TABLET(12.5 MG) BY MOUTH TWICE DAILY (Patient taking differently: Take 12.5 mg by mouth 2 (two) times daily. TAKE 1/2 TABLET(12.5  MG) BY MOUTH TWICE DAILY) 90 tablet 1   terbinafine (LAMISIL) 250 MG tablet Take 1 tablet (250 mg total) by mouth daily. 90 tablet 0   tiZANidine (ZANAFLEX) 4 MG tablet Take 1 tablet (4 mg total) by mouth every 6 (six) hours as needed for muscle spasms. 30 tablet 0   Social History   Socioeconomic History   Marital status: Married    Spouse name: Not on file   Number of children: Not on file   Years of education: Not on file   Highest education level: Not on file  Occupational History   Not on file  Tobacco Use   Smoking status: Never Smoker   Smokeless tobacco: Never Used  Substance and Sexual Activity   Alcohol use: Yes    Comment: occ. 1 beer or a glass of wine a couple times a week.    Drug use: No   Sexual activity: Never  Other Topics Concern   Not on file  Social History Narrative   Not on file   Social Determinants of Health   Financial Resource Strain:    Difficulty of Paying Living Expenses:   Food Insecurity:    Worried About [place] in the Last Year:    Arboriculturist in the Last Year:   Transportation Needs:    Lack of Transportation (Medical):    Lack of  Transportation (Non-Medical):   Physical Activity:    Days of Exercise per Week:    Minutes of Exercise per Session:   Stress:    Feeling of Stress :   Social Connections:    Frequency of Communication with Friends and Family:    Frequency of Social Gatherings with Friends and Family:    Attends Religious Services:    Active Member of Clubs or Organizations:    Attends Engineer, structural:    Marital Status:   Intimate Partner Violence:    Fear of Current or Ex-Partner:    Emotionally Abused:    Physically Abused:    Sexually Abused:    Family History  Problem Relation Age of Onset   Diabetes Father    Diabetes Paternal Aunt    Colon cancer Neg Hx     OBJECTIVE:  Vitals:   05/03/20 1417  BP: 138/82  Pulse: 75  Resp: 20  Temp:  98.5 F (36.9 C)  SpO2: 94%    General appearance: ALERT; in no acute distress.  Head: NCAT Lungs: Normal respiratory effort; CTAB CV: RRR Musculoskeletal: Back Inspection: Skin warm, dry, clear and intact without obvious erythema, effusion, or ecchymosis.  Palpation: TTP over RT low back Skin: warm and dry Neurologic: Ambulates without difficulty; Sensation intact about the upper/ lower extremities Psychological: alert and cooperative; normal mood and affect   ASSESSMENT & PLAN:  1. Acute right-sided low back pain without sciatica   2. Back spasm      Meds ordered this encounter  Medications   cyclobenzaprine (FLEXERIL) 10 MG tablet    Sig: Take 1 tablet (10 mg total) by mouth at bedtime.    Dispense:  15 tablet    Refill:  0    Order Specific Question:   Supervising Provider    Answer:   Eustace Moore [[id number]]    Continue conservative management of rest, ice, and gentle stretches Take cyclobenzaprine at nighttime for symptomatic relief. Avoid driving or operating heavy machinery while using medication. Follow up with PCP if symptoms persist Return or go to the ER if you have any new or worsening symptoms (fever, chills, chest pain, abdominal pain, changes in bowel or bladder habits, pain radiating into lower legs, etc...)   Reviewed expectations re: course of current medical issues. Questions answered. Outlined signs and symptoms indicating need for more acute intervention. Patient verbalized understanding. After Visit Summary given.    Rennis Harding, PA-C 05/03/20 1428

## 2020-05-07 ENCOUNTER — Encounter: Payer: Self-pay | Admitting: Family Medicine

## 2020-05-07 ENCOUNTER — Other Ambulatory Visit: Payer: Self-pay

## 2020-05-07 ENCOUNTER — Ambulatory Visit: Payer: BC Managed Care – PPO | Admitting: Family Medicine

## 2020-05-07 VITALS — BP 128/80 | HR 72 | Temp 97.9°F | Ht 77.0 in | Wt 398.0 lb

## 2020-05-07 DIAGNOSIS — M545 Low back pain, unspecified: Secondary | ICD-10-CM

## 2020-05-07 MED ORDER — DICLOFENAC SODIUM 75 MG PO TBEC: 75.0000 mg | DELAYED_RELEASE_TABLET | Freq: Two times a day (BID) | ORAL | 0 refills | Status: DC

## 2020-05-07 NOTE — Progress Notes (Signed)
Patient ID: James Rivas, male    DOB: [birth year], 49 y.o.   MRN: [id number]   Chief Complaint  Patient presents with   Back Pain   Subjective:    HPI CC-low back pain on right side. Started 5 days ago. Went to urgent care Monday and was prescribed flexeril.  Pt stating the days prior to the pain was sitting at some graduation parties.  No new exercising or heavy lifting.  This has happened a few times over the years when weight lifting etc.   Pt tried advil and took 1000mg  a few times. Has slightly improved with the back pain since taking the flexeril.  No urinary issue, hematuria, pain down legs, saddle anesthesia or bowel or bladder incontinence No direct trauma to back or spine.  No falls.   Medical History Avan has a past medical history of Allergy, Chest pain, HTN (hypertension), Impaired fasting glucose, Morbid obesity ([place]), Sleep apnea, and Venous stasis.   Outpatient Encounter Medications as of 05/07/2020  Medication Sig   chlorthalidone (HYGROTON) 25 MG tablet TAKE 1 TABLET(25 MG) BY MOUTH DAILY (Patient taking differently: Take 25 mg by mouth daily. TAKE 1 TABLET(25 MG) BY MOUTH DAILY)   cyclobenzaprine (FLEXERIL) 10 MG tablet Take 1 tablet (10 mg total) by mouth at bedtime.   enalapril (VASOTEC) 20 MG tablet TAKE 1 TABLET(20 MG) BY MOUTH DAILY (Patient taking differently: Take 20 mg by mouth daily. TAKE 1 TABLET(20 MG) BY MOUTH DAILY)   metoprolol tartrate (LOPRESSOR) 25 MG tablet TAKE 1/2 TABLET(12.5 MG) BY MOUTH TWICE DAILY (Patient taking differently: Take 12.5 mg by mouth 2 (two) times daily. TAKE 1/2 TABLET(12.5 MG) BY MOUTH TWICE DAILY)   terbinafine (LAMISIL) 250 MG tablet Take 1 tablet (250 mg total) by mouth daily.   tiZANidine (ZANAFLEX) 4 MG tablet Take 1 tablet (4 mg total) by mouth every 6 (six) hours as needed for muscle spasms.   diclofenac (VOLTAREN) 75 MG EC tablet Take 1 tablet (75 mg total) by mouth 2 (two) times daily. Take with food.   No  facility-administered encounter medications on file as of 05/07/2020.     Review of Systems  Constitutional: Negative for chills and fever.  Genitourinary: Negative for difficulty urinating, dysuria, frequency, hematuria and urgency.  Musculoskeletal: Positive for back pain.  Skin: Negative for rash.  Neurological: Negative for weakness.     Vitals BP 128/80    Pulse 72    Temp 97.9 F (36.6 C)    Ht 6\' 5"  (1.956 m)    Wt (!) 398 lb (180.5 kg)    SpO2 99%    BMI 47.20 kg/m   Objective:   Physical Exam Vitals and nursing note reviewed.  Constitutional:      General: He is not in acute distress.    Appearance: Normal appearance. He is obese. He is not ill-appearing.  Cardiovascular:     Rate and Rhythm: Normal rate and regular rhythm.     Pulses: Normal pulses.     Heart sounds: Normal heart sounds.  Pulmonary:     Effort: Pulmonary effort is normal. No respiratory distress.     Breath sounds: Normal breath sounds.  Musculoskeletal:        General: Tenderness (rt lower back ttp, no rash, erythema, no ecchymosis. no ttp over t or L spinous process.) present. No swelling, deformity or signs of injury. Normal range of motion.  Skin:    General: Skin is warm and dry.  Findings: No bruising, erythema, lesion or rash.  Neurological:     General: No focal deficit present.     Mental Status: He is alert and oriented to person, place, and time.     Cranial Nerves: No cranial nerve deficit.     Motor: No weakness.     Gait: Gait normal.  Psychiatric:        Mood and Affect: Mood normal.        Behavior: Behavior normal.        Thought Content: Thought content normal.        Judgment: Judgment normal.      Assessment and Plan   1. Acute right-sided low back pain without sciatica - diclofenac (VOLTAREN) 75 MG EC tablet; Take 1 tablet (75 mg total) by mouth 2 (two) times daily. Take with food.  Dispense: 60 tablet; Refill: 0    Advising to only take 800mg  ibuprofen 3x per  day with food, to avoid kidney/GI issues.  While taking diclofenac to avoid taking nsaids. Use flexeril prn. Heat/ice prn. Exercises/stretches prn.  F/u 2-3 wk if not improving.

## 2020-05-07 NOTE — Patient Instructions (Signed)

## 2020-05-07 NOTE — Patient Instructions (Signed)
James Rivas  05/07/2020     @PREFPERIOPPHARMACY @   Your procedure is scheduled on  05/11/2020 .  Report to 05/13/2020 at  1245  P.M.  Call this number if you have problems the morning of surgery:  [phone]   Remember:  Follow the diet and prep instructions given to you by Dr [phone] office.                        Take these medicines the morning of surgery with A SIP OF WATER  Flexeril, metoprolol, zanaflex.    Do not wear jewelry, make-up or nail polish.  Do not wear lotions, powders, or perfumes. Please wear deodorant and brush your teeth.  Do not shave 48 hours prior to surgery.  Men may shave face and neck.  Do not bring valuables to the hospital.  [facility] is not responsible for any belongings or valuables.  Contacts, dentures or bridgework may not be worn into surgery.  Leave your suitcase in the car.  After surgery it may be brought to your room.  For patients admitted to the hospital, discharge time will be determined by your treatment team.  Patients discharged the day of surgery will not be allowed to drive home.   Name and phone number of your driver:   family Special instructions:  DO NOT smoke the morning of your procedure.  Please read over the following fact sheets that you were given. Anesthesia Post-op Instructions and Care and Recovery After Surgery       Colonoscopy, Adult, Care After This sheet gives you information about how to care for yourself after your procedure. Your health care provider may also give you more specific instructions. If you have problems or questions, contact your health care provider. What can I expect after the procedure? After the procedure, it is common to have:  A small amount of blood in your stool for 24 hours after the procedure.  Some gas.  Mild cramping or bloating of your abdomen. Follow these instructions at home: Eating and drinking   Drink enough fluid to keep your urine pale yellow.  Follow  instructions from your health care provider about eating or drinking restrictions.  Resume your normal diet as instructed by your health care provider. Avoid heavy or fried foods that are hard to digest. Activity  Rest as told by your health care provider.  Avoid sitting for a long time without moving. Get up to take short walks every 1-2 hours. This is important to improve blood flow and breathing. Ask for help if you feel weak or unsteady.  Return to your normal activities as told by your health care provider. Ask your health care provider what activities are safe for you. Managing cramping and bloating   Try walking around when you have cramps or feel bloated.  Apply heat to your abdomen as told by your health care provider. Use the heat source that your health care provider recommends, such as a moist heat pack or a heating pad. ? Place a towel between your skin and the heat source. ? Leave the heat on for 20-30 minutes. ? Remove the heat if your skin turns bright red. This is especially important if you are unable to feel pain, heat, or cold. You may have a greater risk of getting burned. General instructions  For the first 24 hours after the procedure: ? Do not drive or use machinery. ?  Do not sign important documents. ? Do not drink alcohol. ? Do your regular daily activities at a slower pace than normal. ? Eat soft foods that are easy to digest.  Take over-the-counter and prescription medicines only as told by your health care provider.  Keep all follow-up visits as told by your health care provider. This is important. Contact a health care provider if:  You have blood in your stool 2-3 days after the procedure. Get help right away if you have:  More than a small spotting of blood in your stool.  Large blood clots in your stool.  Swelling of your abdomen.  Nausea or vomiting.  A fever.  Increasing pain in your abdomen that is not relieved with  medicine. Summary  After the procedure, it is common to have a small amount of blood in your stool. You may also have mild cramping and bloating of your abdomen.  For the first 24 hours after the procedure, do not drive or use machinery, sign important documents, or drink alcohol.  Get help right away if you have a lot of blood in your stool, nausea or vomiting, a fever, or increased pain in your abdomen. This information is not intended to replace advice given to you by your health care provider. Make sure you discuss any questions you have with your health care provider. Document Revised: 06/09/2019 Document Reviewed: 06/09/2019 Elsevier Patient Education  [place] After These instructions provide you with information about caring for yourself after your procedure. Your health care provider may also give you more specific instructions. Your treatment has been planned according to current medical practices, but problems sometimes occur. Call your health care provider if you have any problems or questions after your procedure. What can I expect after the procedure? After your procedure, you may:  Feel sleepy for several hours.  Feel clumsy and have poor balance for several hours.  Feel forgetful about what happened after the procedure.  Have poor judgment for several hours.  Feel nauseous or vomit.  Have a sore throat if you had a breathing tube during the procedure. Follow these instructions at home: For at least 24 hours after the procedure:      Have a responsible adult stay with you. It is important to have someone help care for you until you are awake and alert.  Rest as needed.  Do not: ? Participate in activities in which you could fall or become injured. ? Drive. ? Use heavy machinery. ? Drink alcohol. ? Take sleeping pills or medicines that cause drowsiness. ? Make important decisions or sign legal documents. ? Take care  of children on your own. Eating and drinking  Follow the diet that is recommended by your health care provider.  If you vomit, drink water, juice, or soup when you can drink without vomiting.  Make sure you have little or no nausea before eating solid foods. General instructions  Take over-the-counter and prescription medicines only as told by your health care provider.  If you have sleep apnea, surgery and certain medicines can increase your risk for breathing problems. Follow instructions from your health care provider about wearing your sleep device: ? Anytime you are sleeping, including during daytime naps. ? While taking prescription pain medicines, sleeping medicines, or medicines that make you drowsy.  If you smoke, do not smoke without supervision.  Keep all follow-up visits as told by your health care provider. This is important. Contact a health  care provider if:  You keep feeling nauseous or you keep vomiting.  You feel light-headed.  You develop a rash.  You have a fever. Get help right away if:  You have trouble breathing. Summary  For several hours after your procedure, you may feel sleepy and have poor judgment.  Have a responsible adult stay with you for at least 24 hours or until you are awake and alert. This information is not intended to replace advice given to you by your health care provider. Make sure you discuss any questions you have with your health care provider. Document Revised: 02/11/2018 Document Reviewed: 03/05/2016 Elsevier Patient Education  [place].

## 2020-05-10 ENCOUNTER — Other Ambulatory Visit (HOSPITAL_COMMUNITY)
Admission: RE | Admit: 2020-05-10 | Discharge: 2020-05-10 | Disposition: A | Discharge status: Home / Self Care | Payer: BC Managed Care – PPO | Source: Ambulatory Visit | Attending: Internal Medicine | Admitting: Internal Medicine

## 2020-05-10 ENCOUNTER — Other Ambulatory Visit: Payer: Self-pay

## 2020-05-10 ENCOUNTER — Encounter (HOSPITAL_COMMUNITY): Payer: Self-pay

## 2020-05-10 ENCOUNTER — Encounter (HOSPITAL_COMMUNITY)
Admission: RE | Admit: 2020-05-10 | Discharge: 2020-05-10 | Disposition: A | Discharge status: Home / Self Care | Payer: BC Managed Care – PPO | Source: Ambulatory Visit | Attending: Internal Medicine | Admitting: Internal Medicine

## 2020-05-10 DIAGNOSIS — Z01818 Encounter for other preprocedural examination: Secondary | ICD-10-CM | POA: Diagnosis not present

## 2020-05-10 DIAGNOSIS — Z20822 Contact with and (suspected) exposure to covid-19: Secondary | ICD-10-CM | POA: Diagnosis not present

## 2020-05-10 LAB — SARS CORONAVIRUS 2 (TAT 6-24 HRS): SARS Coronavirus 2: NEGATIVE

## 2020-05-10 LAB — BASIC METABOLIC PANEL
Anion gap: 11 (ref 5–15)
BUN: 16 mg/dL (ref 6–20)
CO2: 24 mmol/L (ref 22–32)
Calcium: 9.1 mg/dL (ref 8.9–10.3)
Chloride: 100 mmol/L (ref 98–111)
Creatinine, Ser: 0.94 mg/dL (ref 0.61–1.24)
GFR calc Af Amer: >60 mL/min (ref >60)
GFR calc non Af Amer: >60 mL/min (ref >60)
Glucose, Bld: 129 mg/dL — ABNORMAL HIGH (ref 70–99)
Potassium: 3.7 mmol/L (ref 3.5–5.1)
Sodium: 135 mmol/L (ref 135–145)

## 2020-05-11 ENCOUNTER — Other Ambulatory Visit: Payer: Self-pay

## 2020-05-11 ENCOUNTER — Ambulatory Visit (HOSPITAL_COMMUNITY)
Admission: RE | Admit: 2020-05-11 | Discharge: 2020-05-11 | Disposition: A | Discharge status: Home / Self Care | Payer: BC Managed Care – PPO | Attending: Internal Medicine | Admitting: Internal Medicine

## 2020-05-11 ENCOUNTER — Ambulatory Visit (HOSPITAL_COMMUNITY): Payer: BC Managed Care – PPO | Admitting: Anesthesiology

## 2020-05-11 ENCOUNTER — Telehealth: Payer: Self-pay

## 2020-05-11 ENCOUNTER — Encounter (HOSPITAL_COMMUNITY)
Admission: RE | Disposition: A | Discharge status: Home / Self Care | Payer: Self-pay | Source: Home / Self Care | Attending: Internal Medicine

## 2020-05-11 ENCOUNTER — Encounter (HOSPITAL_COMMUNITY): Payer: Self-pay | Admitting: Internal Medicine

## 2020-05-11 DIAGNOSIS — Z79899 Other long term (current) drug therapy: Secondary | ICD-10-CM | POA: Insufficient documentation

## 2020-05-11 DIAGNOSIS — G4730 Sleep apnea, unspecified: Secondary | ICD-10-CM | POA: Insufficient documentation

## 2020-05-11 DIAGNOSIS — Z1211 Encounter for screening for malignant neoplasm of colon: Secondary | ICD-10-CM | POA: Insufficient documentation

## 2020-05-11 DIAGNOSIS — K5730 Diverticulosis of large intestine without perforation or abscess without bleeding: Secondary | ICD-10-CM | POA: Diagnosis not present

## 2020-05-11 DIAGNOSIS — I10 Essential (primary) hypertension: Secondary | ICD-10-CM | POA: Diagnosis not present

## 2020-05-11 DIAGNOSIS — Z791 Long term (current) use of non-steroidal anti-inflammatories (NSAID): Secondary | ICD-10-CM | POA: Diagnosis not present

## 2020-05-11 HISTORY — PX: COLONOSCOPY WITH PROPOFOL: SHX5780

## 2020-05-11 SURGERY — COLONOSCOPY WITH PROPOFOL
Anesthesia: General

## 2020-05-11 MED ORDER — KETAMINE HCL 10 MG/ML IJ SOLN
INTRAMUSCULAR | Status: DC | PRN
  Administered 2020-05-11: 20 mg via INTRAVENOUS

## 2020-05-11 MED ORDER — KETAMINE HCL 50 MG/5ML IJ SOSY
PREFILLED_SYRINGE | INTRAMUSCULAR | Status: AC
  Filled 2020-05-11: qty 5

## 2020-05-11 MED ORDER — PROPOFOL 10 MG/ML IV BOLUS
INTRAVENOUS | Status: DC | PRN
  Administered 2020-05-11: 50 mg via INTRAVENOUS
  Administered 2020-05-11: 20 mg via INTRAVENOUS
  Administered 2020-05-11 (×2): 30 mg via INTRAVENOUS

## 2020-05-11 MED ORDER — PROPOFOL 500 MG/50ML IV EMUL
INTRAVENOUS | Status: DC | PRN
  Administered 2020-05-11: 150 ug/kg/min via INTRAVENOUS

## 2020-05-11 MED ORDER — LACTATED RINGERS IV SOLN
INTRAVENOUS | Status: DC
  Administered 2020-05-11: 1000 mL via INTRAVENOUS

## 2020-05-11 MED ORDER — CHLORHEXIDINE GLUCONATE CLOTH 2 % EX PADS: 6.0000 | MEDICATED_PAD | Freq: Once | CUTANEOUS | Status: DC

## 2020-05-11 NOTE — Anesthesia Postprocedure Evaluation (Signed)
Anesthesia Post Note  Patient: James Rivas  Procedure(s) Performed: COLONOSCOPY WITH PROPOFOL (N/A )  Patient location during evaluation: PACU Anesthesia Type: General Level of consciousness: awake and alert Pain management: pain level controlled Vital Signs Assessment: post-procedure vital signs reviewed and stable Respiratory status: spontaneous breathing Cardiovascular status: blood pressure returned to baseline and stable Postop Assessment: no apparent nausea or vomiting Anesthetic complications: no   No complications documented.   Last Vitals:  Vitals:   05/11/20 1306 05/11/20 1417  BP: 125/68 (P) 116/76  Pulse: 76 (P) 83  Resp: 16 (P) 20  Temp: 36.8 C (P) 36.6 C  SpO2: 97% (P) 94%    Last Pain:  Vitals:   05/11/20 1352  PainSc: 0-No pain                 Felisha Claytor

## 2020-05-11 NOTE — Transfer of Care (Signed)
Immediate Anesthesia Transfer of Care Note  Patient: James Rivas  Procedure(s) Performed: COLONOSCOPY WITH PROPOFOL (N/A )  Patient Location: PACU  Anesthesia Type:General  Level of Consciousness: awake  Airway & Oxygen Therapy: Patient Spontanous Breathing  Post-op Assessment: Report given to RN  Post vital signs: Reviewed  Last Vitals:  Vitals Value Taken Time  BP    Temp    Pulse    Resp    SpO2      Last Pain:  Vitals:   05/11/20 1352  PainSc: 0-No pain      Patients Stated Pain Goal: 7 (05/11/20 1306)  Complications: No complications documented.

## 2020-05-11 NOTE — Anesthesia Preprocedure Evaluation (Signed)
Anesthesia Evaluation  Patient identified by MRN, date of birth, ID band Patient awake    Reviewed: Allergy & Precautions, H&P , NPO status , Patient's Chart, lab work & pertinent test results, reviewed documented beta blocker date and time   Airway Mallampati: III  TM Distance: >3 FB Neck ROM: full    Dental no notable dental hx. (+) Teeth Intact   Pulmonary shortness of breath, sleep apnea ,    Pulmonary exam normal breath sounds clear to auscultation       Cardiovascular Exercise Tolerance: Good hypertension, negative cardio ROS   Rhythm:regular Rate:Normal     Neuro/Psych negative neurological ROS  negative psych ROS   GI/Hepatic negative GI ROS, Neg liver ROS,   Endo/Other  negative endocrine ROS  Renal/GU negative Renal ROS  negative genitourinary   Musculoskeletal   Abdominal   Peds  Hematology negative hematology ROS (+)   Anesthesia Other Findings   Reproductive/Obstetrics negative OB ROS                             Anesthesia Physical Anesthesia Plan  ASA: III  Anesthesia Plan: General   Post-op Pain Management:    Induction:   PONV Risk Score and Plan: 1 and Propofol infusion  Airway Management Planned:   Additional Equipment:   Intra-op Plan:   Post-operative Plan:   Informed Consent: I have reviewed the patients History and Physical, chart, labs and discussed the procedure including the risks, benefits and alternatives for the proposed anesthesia with the patient or authorized representative who has indicated his/her understanding and acceptance.     Dental Advisory Given  Plan Discussed with: CRNA  Anesthesia Plan Comments:         Anesthesia Quick Evaluation

## 2020-05-11 NOTE — H&P (Signed)
@LOGO @   Primary Care Physician:  , MD Primary Gastroenterologist:  Dr. Merlyn Albert  Pre-Procedure History & Physical: HPI:  James Rivas is a 49 y.o. male is here for a screening colonoscopy.  First ever average rescreening examination.  No bowel symptoms.  No family history of colon cancer.  None  Past Medical History:  Diagnosis Date   Allergy    Chest pain    HTN (hypertension)    Impaired fasting glucose    Morbid obesity (HCC)    Sleep apnea    uses a CPAP   Venous stasis     Past Surgical History:  Procedure Laterality Date   FINGER SURGERY Left    left ring finger 2 surgeries   I & D EXTREMITY  11/29/2011   Procedure: IRRIGATION AND DEBRIDEMENT EXTREMITY;  Surgeon: 01/27/2012;  Location: [facility] OR;  Service: Orthopedics;  Laterality: Left;  I&D Right Long and Index Fingers with Tendon Repair as Needed    Prior to Admission medications   Medication Sig Start Date End Date Taking? Authorizing Provider  chlorthalidone (HYGROTON) 25 MG tablet TAKE 1 TABLET(25 MG) BY MOUTH DAILY Patient taking differently: Take 25 mg by mouth daily. TAKE 1 TABLET(25 MG) BY MOUTH DAILY 01/05/20  Yes 03/04/20, MD  enalapril (VASOTEC) 20 MG tablet TAKE 1 TABLET(20 MG) BY MOUTH DAILY Patient taking differently: Take 20 mg by mouth daily. TAKE 1 TABLET(20 MG) BY MOUTH DAILY 01/05/20  Yes 03/04/20, MD  metoprolol tartrate (LOPRESSOR) 25 MG tablet TAKE 1/2 TABLET(12.5 MG) BY MOUTH TWICE DAILY Patient taking differently: Take 12.5 mg by mouth 2 (two) times daily. TAKE 1/2 TABLET(12.5 MG) BY MOUTH TWICE DAILY 01/05/20  Yes 03/04/20, MD  terbinafine (LAMISIL) 250 MG tablet Take 1 tablet (250 mg total) by mouth daily. 03/25/20  Yes 03/27/20, DPM  tiZANidine (ZANAFLEX) 4 MG tablet Take 1 tablet (4 mg total) by mouth every 6 (six) hours as needed for muscle spasms. 01/21/20  Yes 01/23/20, MD  cyclobenzaprine (FLEXERIL) 10 MG tablet Take 1 tablet (10 mg total) by  mouth at bedtime. 05/03/20   Wurst, 07/03/20, PA-C  diclofenac (VOLTAREN) 75 MG EC tablet Take 1 tablet (75 mg total) by mouth 2 (two) times daily. Take with food. 05/07/20   07/07/20, DO    Allergies as of 03/22/2020   (No Known Allergies)    Family History  Problem Relation Age of Onset   Diabetes Father    Diabetes Paternal Aunt    Colon cancer Neg Hx     Social History   Socioeconomic History   Marital status: Married    Spouse name: Not on file   Number of children: Not on file   Years of education: Not on file   Highest education level: Not on file  Occupational History   Not on file  Tobacco Use   Smoking status: Never Smoker   Smokeless tobacco: Never Used  Vaping Use   Vaping Use: Never used  Substance and Sexual Activity   Alcohol use: Yes    Comment: occ. 1 beer or a glass of wine a couple times a week.    Drug use: No   Sexual activity: Never  Other Topics Concern   Not on file  Social History Narrative   Not on file   Social Determinants of Health   Financial Resource Strain:    Difficulty of Paying Living Expenses:  Food Insecurity:    Worried About Charity fundraiser in the Last Year:    Arboriculturist in the Last Year:   Transportation Needs:    Film/video editor (Medical):    Lack of Transportation (Non-Medical):   Physical Activity:    Days of Exercise per Week:    Minutes of Exercise per Session:   Stress:    Feeling of Stress :   Social Connections:    Frequency of Communication with Friends and Family:    Frequency of Social Gatherings with Friends and Family:    Attends Religious Services:    Active Member of Clubs or Organizations:    Attends Music therapist:    Marital Status:   Intimate Partner Violence:    Fear of Current or Ex-Partner:    Emotionally Abused:    Physically Abused:    Sexually Abused:     Review of Systems: See HPI, otherwise negative ROS  Physical Exam: There were no vitals  taken for this visit. General:   Alert,  Well-developed, well-nourished, pleasant and cooperative in NAD Lungs:  Clear throughout to auscultation.   No wheezes, crackles, or rhonchi. No acute distress. Heart:  Regular rate and rhythm; no murmurs, clicks, rubs,  or gallops. Abdomen:  Soft, nontender and nondistended. No masses, hepatosplenomegaly or hernias noted. Normal bowel sounds, without guarding, and without rebound.   Impression/Plan: James Rivas is now here to undergo a screening colonoscopy.  First ever average screening examination.  Risks, benefits, limitations, imponderables and alternatives regarding colonoscopy have been reviewed with the patient. Questions have been answered. All parties agreeable.     Notice:  This dictation was prepared with Dragon dictation along with smaller phrase technology. Any transcriptional errors that result from this process are unintentional and may not be corrected upon review.

## 2020-05-11 NOTE — Telephone Encounter (Signed)
Pt is scheduled for a TCS with RMR at 2:05PM today. Pt called with concerns of his stool. Pts stool is of liquid form but isn't clear. Pt states the liquid still has brown in it. Pt states that he has done everything that he has suppose to and hasn't eaten any solid foods prior to starting prep.  Please advise. [phone]

## 2020-05-11 NOTE — Telephone Encounter (Signed)
Take 17 g of MiraLAX in 8 ounces of water at 11:00 and repeat at 12:00.  No more liquids after 12 noon

## 2020-05-11 NOTE — Telephone Encounter (Signed)
Noted. Spoke with pt. Pt is aware of RMR recommendations of Miralax. Pt will complete Miralax as directed and take nothing after 12PM per RMR.

## 2020-05-11 NOTE — Op Note (Signed)
[facility] Patient Name: James Rivas Procedure Date: 05/11/2020 1:19 PM MRN: [id number] Date of Birth: 1971-10-19 Attending MD: Gennette Pac , MD CSN: [id number] Age: 49 Admit Type: Outpatient Procedure:                Colonoscopy Indications:              Screening for colorectal malignant neoplasm Providers:                Gennette Pac, MD, Crystal Page, Edrick Kins, RN, Dyann Ruddle Referring MD:              Medicines:                Propofol per Anesthesia Complications:            No immediate complications. Estimated Blood Loss:     Estimated blood loss: none. Procedure:                Pre-Anesthesia Assessment:                           - Prior to the procedure, a History and Physical                            was performed, and patient medications and                            allergies were reviewed. The patient's tolerance of                            previous anesthesia was also reviewed. The risks                            and benefits of the procedure and the sedation                            options and risks were discussed with the patient.                            All questions were answered, and informed consent                            was obtained. Prior Anticoagulants: The patient has                            taken no previous anticoagulant or antiplatelet                            agents. ASA Grade Assessment: III - A patient with                            severe systemic disease. After reviewing the risks  and benefits, the patient was deemed in                            satisfactory condition to undergo the procedure.                           After obtaining informed consent, the colonoscope                            was passed under direct vision. Throughout the                            procedure, the patient's blood pressure, pulse, and                             oxygen saturations were monitored continuously. The                            CF-[id number] ([id number]) scope was introduced through                            the anus and advanced to the the cecum, identified                            by appendiceal orifice and ileocecal valve. The                            colonoscopy was performed without difficulty. The                            patient tolerated the procedure well. The quality                            of the bowel preparation was adequate. The                            ileocecal valve, appendiceal orifice, and rectum                            were photographed. The entire colon was well                            visualized. The quality of the bowel preparation                            was adequate. Scope In: 1:56:41 PM Scope Out: 2:10:50 PM Scope Withdrawal Time: 0 hours 10 minutes 43 seconds  Total Procedure Duration: 0 hours 14 minutes 9 seconds  Findings:      The perianal and digital rectal examinations were normal.      Scattered medium-mouthed diverticula were found in the sigmoid colon and       descending colon.      The exam was otherwise without abnormality on direct and retroflexion       views. Impression:               -  Diverticulosis in the sigmoid colon and in the                            descending colon.                           - The examination was otherwise normal on direct                            and retroflexion views.                           - No specimens collected. Moderate Sedation:      Moderate (conscious) sedation was personally administered by an       anesthesia professional. The following parameters were monitored: oxygen       saturation, heart rate, blood pressure, respiratory rate, EKG, adequacy       of pulmonary ventilation, and response to care. Recommendation:           - Patient has a contact number available for                            emergencies. The signs and symptoms  of potential                            delayed complications were discussed with the                            patient. Return to normal activities tomorrow.                            Written discharge instructions were provided to the                            patient.                           - Resume previous diet.                           - Continue present medications.                           - Repeat colonoscopy in 10 years for screening                            purposes.                           - Return to GI office PRN. Procedure Code(s):        --- Professional ---                           [phone], Colonoscopy, flexible; diagnostic, including                            collection of specimen(s) by brushing or  washing,                            when performed (separate procedure) Diagnosis Code(s):        --- Professional ---                           Z12.11, Encounter for screening for malignant                            neoplasm of colon                           K57.30, Diverticulosis of large intestine without                            perforation or abscess without bleeding CPT copyright 2019 American Medical Association. All rights reserved. The codes documented in this report are preliminary and upon coder review may  be revised to meet current compliance requirements. Gerrit Friends. Ashely Goosby, MD Gennette Pac, MD 05/11/2020 2:18:05 PM This report has been signed electronically. Number of Addenda: 0

## 2020-05-11 NOTE — Discharge Instructions (Signed)
Diverticulosis  Diverticulosis is a condition that develops when small pouches (diverticula) form in the wall of the large intestine (colon). The colon is where water is absorbed and stool (feces) is formed. The pouches form when the inside layer of the colon pushes through weak spots in the outer layers of the colon. You may have a few pouches or many of them. The pouches usually do not cause problems unless they become inflamed or infected. When this happens, the condition is called diverticulitis. What are the causes? The cause of this condition is not known. What increases the risk? The following factors may make you more likely to develop this condition:  Being older than age 18. Your risk for this condition increases with age. Diverticulosis is rare among people younger than age 47. By age 73, many people have it.  Eating a low-fiber diet.  Having frequent constipation.  Being overweight.  Not getting enough exercise.  Smoking.  Taking over-the-counter pain medicines, like aspirin and ibuprofen.  Having a family history of diverticulosis. What are the signs or symptoms? In most people, there are no symptoms of this condition. If you do have symptoms, they may include:  Bloating.  Cramps in the abdomen.  Constipation or diarrhea.  Pain in the lower left side of the abdomen. How is this diagnosed? Because diverticulosis usually has no symptoms, it is most often diagnosed during an exam for other colon problems. The condition may be diagnosed by:  Using a flexible scope to examine the colon (colonoscopy).  Taking an X-ray of the colon after dye has been put into the colon (barium enema).  Having a CT scan. How is this treated? You may not need treatment for this condition. Your health care provider may recommend treatment to prevent problems. You may need treatment if you have symptoms or if you previously had diverticulitis. Treatment may include:  Eating a high-fiber  diet.  Taking a fiber supplement.  Taking a live bacteria supplement (probiotic).  Taking medicine to relax your colon. Follow these instructions at home: Medicines  Take over-the-counter and prescription medicines only as told by your health care provider.  If told by your health care provider, take a fiber supplement or probiotic. Constipation prevention Your condition may cause constipation. To prevent or treat constipation, you may need to:  Drink enough fluid to keep your urine pale yellow.  Take over-the-counter or prescription medicines.  Eat foods that are high in fiber, such as beans, whole grains, and fresh fruits and vegetables.  Limit foods that are high in fat and processed sugars, such as fried or sweet foods.  General instructions  Try not to strain when you have a bowel movement.  Keep all follow-up visits as told by your health care provider. This is important. Contact a health care provider if you:  Have pain in your abdomen.  Have bloating.  Have cramps.  Have not had a bowel movement in 3 days. Get help right away if:  Your pain gets worse.  Your bloating becomes very bad.  You have a fever or chills, and your symptoms suddenly get worse.  You vomit.  You have bowel movements that are bloody or black.  You have bleeding from your rectum. Summary  Diverticulosis is a condition that develops when small pouches (diverticula) form in the wall of the large intestine (colon).  You may have a few pouches or many of them.  This condition is most often diagnosed during an exam for other colon  problems.  Treatment may include increasing the fiber in your diet, taking supplements, or taking medicines. This information is not intended to replace advice given to you by your health care provider. Make sure you discuss any questions you have with your health care provider. Document Revised: 06/12/2019 Document Reviewed: 06/12/2019 Elsevier Patient  Education  2020 [employer]  Colonoscopy Discharge Instructions  Read the instructions outlined below and refer to this sheet in the next few weeks. These discharge instructions provide you with general information on caring for yourself after you leave the hospital. Your doctor may also give you specific instructions. While your treatment has been planned according to the most current medical practices available, unavoidable complications occasionally occur. If you have any problems or questions after discharge, call Dr. Jena Gauss at [phone]. ACTIVITY  You may resume your regular activity, but move at a slower pace for the next 24 hours.   Take frequent rest periods for the next 24 hours.   Walking will help get rid of the air and reduce the bloated feeling in your belly (abdomen).   No driving for 24 hours (because of the medicine (anesthesia) used during the test).    Do not sign any important legal documents or operate any machinery for 24 hours (because of the anesthesia used during the test).  NUTRITION  Drink plenty of fluids.   You may resume your normal diet as instructed by your doctor.   Begin with a light meal and progress to your normal diet. Heavy or fried foods are harder to digest and may make you feel sick to your stomach (nauseated).   Avoid alcoholic beverages for 24 hours or as instructed.  MEDICATIONS  You may resume your normal medications unless your doctor tells you otherwise.  WHAT YOU CAN EXPECT TODAY  Some feelings of bloating in the abdomen.   Passage of more gas than usual.   Spotting of blood in your stool or on the toilet paper.  IF YOU HAD POLYPS REMOVED DURING THE COLONOSCOPY:  No aspirin products for 7 days or as instructed.   No alcohol for 7 days or as instructed.   Eat a soft diet for the next 24 hours.  FINDING OUT THE RESULTS OF YOUR TEST Not all test results are available during your visit. If your test results are not back during  the visit, make an appointment with your caregiver to find out the results. Do not assume everything is normal if you have not heard from your caregiver or the medical facility. It is important for you to follow up on all of your test results.  SEEK IMMEDIATE MEDICAL ATTENTION IF:  You have more than a spotting of blood in your stool.   Your belly is swollen (abdominal distention).   You are nauseated or vomiting.   You have a temperature over 101.   You have abdominal pain or discomfort that is severe or gets worse throughout the day.  EGD Discharge instructions Please read the instructions outlined below and refer to this sheet in the next few weeks. These discharge instructions provide you with general information on caring for yourself after you leave the hospital. Your doctor may also give you specific instructions. While your treatment has been planned according to the most current medical practices available, unavoidable complications occasionally occur. If you have any problems or questions after discharge, please call your doctor. ACTIVITY  You may resume your regular activity but move at a slower pace for the next  24 hours.   Take frequent rest periods for the next 24 hours.   Walking will help expel (get rid of) the air and reduce the bloated feeling in your abdomen.   No driving for 24 hours (because of the anesthesia (medicine) used during the test).   You may shower.   Do not sign any important legal documents or operate any machinery for 24 hours (because of the anesthesia used during the test).  NUTRITION  Drink plenty of fluids.   You may resume your normal diet.   Begin with a light meal and progress to your normal diet.   Avoid alcoholic beverages for 24 hours or as instructed by your caregiver.  MEDICATIONS  You may resume your normal medications unless your caregiver tells you otherwise.  WHAT YOU CAN EXPECT TODAY  You may experience abdominal discomfort  such as a feeling of fullness or "gas" pains.  FOLLOW-UP  Your doctor will discuss the results of your test with you.  SEEK IMMEDIATE MEDICAL ATTENTION IF ANY OF THE FOLLOWING OCCUR:  Excessive nausea (feeling sick to your stomach) and/or vomiting.   Severe abdominal pain and distention (swelling).   Trouble swallowing.   Temperature over 101 F (37.8 C).   Rectal bleeding or vomiting of blood.   Diverticulosis information provided  No polyps found today.  I recommend a repeat colonoscopy for screening purposes in 10 years  At patient request, I called Isaul Landi at [phone]  -no answer

## 2020-05-13 DIAGNOSIS — G4733 Obstructive sleep apnea (adult) (pediatric): Secondary | ICD-10-CM | POA: Diagnosis not present

## 2020-05-14 ENCOUNTER — Encounter (HOSPITAL_COMMUNITY): Payer: Self-pay | Admitting: Internal Medicine

## 2020-06-01 ENCOUNTER — Other Ambulatory Visit: Payer: Self-pay | Admitting: Family Medicine

## 2020-06-01 DIAGNOSIS — M545 Low back pain, unspecified: Secondary | ICD-10-CM

## 2020-06-01 NOTE — Telephone Encounter (Signed)
Seen 05/07/20 for back pain and prescribed diclofenac

## 2020-06-12 DIAGNOSIS — G4733 Obstructive sleep apnea (adult) (pediatric): Secondary | ICD-10-CM | POA: Diagnosis not present

## 2020-07-09 ENCOUNTER — Ambulatory Visit: Payer: BC Managed Care – PPO | Admitting: Family Medicine

## 2020-07-13 DIAGNOSIS — G4733 Obstructive sleep apnea (adult) (pediatric): Secondary | ICD-10-CM | POA: Diagnosis not present

## 2020-07-30 ENCOUNTER — Encounter: Payer: Self-pay | Admitting: Family Medicine

## 2020-07-30 ENCOUNTER — Other Ambulatory Visit: Payer: Self-pay

## 2020-07-30 ENCOUNTER — Ambulatory Visit: Payer: BC Managed Care – PPO | Admitting: Family Medicine

## 2020-07-30 VITALS — BP 124/80 | HR 90 | Temp 97.5°F | Ht 77.0 in | Wt 397.0 lb

## 2020-07-30 DIAGNOSIS — M545 Low back pain, unspecified: Secondary | ICD-10-CM

## 2020-07-30 DIAGNOSIS — I10 Essential (primary) hypertension: Secondary | ICD-10-CM

## 2020-07-30 DIAGNOSIS — R7301 Impaired fasting glucose: Secondary | ICD-10-CM

## 2020-07-30 MED ORDER — METOPROLOL TARTRATE 25 MG PO TABS: 12.5000 mg | ORAL_TABLET | Freq: Two times a day (BID) | ORAL | 1 refills | Status: DC

## 2020-07-30 MED ORDER — CHLORTHALIDONE 25 MG PO TABS: ORAL_TABLET | ORAL | 1 refills | Status: DC

## 2020-07-30 MED ORDER — ENALAPRIL MALEATE 20 MG PO TABS: ORAL_TABLET | ORAL | 1 refills | Status: DC

## 2020-07-30 NOTE — Progress Notes (Signed)
Patient ID: James Rivas, male    DOB: 02-Nov-1971, 49 y.o.   MRN: [id number]   Chief Complaint  Patient presents with  . Back Pain   Subjective:    HPI  Follow up on back pain. Pain is in lower back. Taking diclofenac, flexeril and zanaflex. States pain is better when taking meds. No urinatry issues. No bowel or bladder incontinence. No radiation of pain/numbness down legs.  Wanting to do forms for weight loss clinic.  Working long times 12 hr shifts and in heat.  Feeling his weight is affecting him. Trying to hydrated.  Hard to eat during those times due to heat.   Has noticed drinking water and gatorade at work, not wanting to eat really.  Attributing this to the heat.  Last lab in 6/21- elevated fasting glucose.   Medical History Thornton has a past medical history of Allergy, Chest pain, HTN (hypertension), Impaired fasting glucose, Morbid obesity ([place]), Sleep apnea, and Venous stasis.   Outpatient Encounter Medications as of 07/30/2020  Medication Sig  . chlorthalidone (HYGROTON) 25 MG tablet TAKE 1 TABLET(25 MG) BY MOUTH DAILY  . cyclobenzaprine (FLEXERIL) 10 MG tablet Take 1 tablet (10 mg total) by mouth at bedtime.  . diclofenac (VOLTAREN) 75 MG EC tablet TAKE 1 TABLET(75 MG) BY MOUTH TWICE DAILY WITH FOOD  . enalapril (VASOTEC) 20 MG tablet TAKE 1 TABLET(20 MG) BY MOUTH DAILY  . metoprolol tartrate (LOPRESSOR) 25 MG tablet Take 0.5 tablets (12.5 mg total) by mouth 2 (two) times daily. TAKE 1/2 TABLET(12.5 MG) BY MOUTH TWICE DAILY  . tiZANidine (ZANAFLEX) 4 MG tablet Take 1 tablet (4 mg total) by mouth every 6 (six) hours as needed for muscle spasms.  . [DISCONTINUED] chlorthalidone (HYGROTON) 25 MG tablet TAKE 1 TABLET(25 MG) BY MOUTH DAILY (Patient taking differently: Take 25 mg by mouth daily. TAKE 1 TABLET(25 MG) BY MOUTH DAILY)  . [DISCONTINUED] enalapril (VASOTEC) 20 MG tablet TAKE 1 TABLET(20 MG) BY MOUTH DAILY (Patient taking differently: Take 20 mg by mouth daily. TAKE 1  TABLET(20 MG) BY MOUTH DAILY)  . [DISCONTINUED] metoprolol tartrate (LOPRESSOR) 25 MG tablet TAKE 1/2 TABLET(12.5 MG) BY MOUTH TWICE DAILY (Patient taking differently: Take 12.5 mg by mouth 2 (two) times daily. TAKE 1/2 TABLET(12.5 MG) BY MOUTH TWICE DAILY)  . [DISCONTINUED] terbinafine (LAMISIL) 250 MG tablet Take 1 tablet (250 mg total) by mouth daily.   No facility-administered encounter medications on file as of 07/30/2020.     Review of Systems  Constitutional: Positive for fatigue. Negative for chills and fever.  HENT: Negative for congestion, rhinorrhea and sore throat.   Respiratory: Negative for cough, shortness of breath and wheezing.   Cardiovascular: Negative for chest pain and leg swelling.  Gastrointestinal: Negative for abdominal pain, diarrhea, nausea and vomiting.  Genitourinary: Negative for dysuria and frequency.  Musculoskeletal: Positive for back pain (improving).  Skin: Negative for rash.  Neurological: Negative for dizziness, weakness and headaches.     Vitals BP 124/80   Pulse 90   Temp (!) 97.5 F (36.4 C)   Ht 6' 5"  (1.956 m)   Wt (!) 397 lb (180.1 kg)   SpO2 99%   BMI 47.08 kg/m   Objective:   Physical Exam Vitals and nursing note reviewed.  Constitutional:      General: He is not in acute distress.    Appearance: Normal appearance. He is obese. He is not ill-appearing.  HENT:     Head: Normocephalic.  Nose: Nose normal. No congestion.     Mouth/Throat:     Mouth: Mucous membranes are moist.     Pharynx: No oropharyngeal exudate.  Eyes:     Extraocular Movements: Extraocular movements intact.     Conjunctiva/sclera: Conjunctivae normal.     Pupils: Pupils are equal, round, and reactive to light.  Cardiovascular:     Rate and Rhythm: Normal rate and regular rhythm.     Pulses: Normal pulses.     Heart sounds: Normal heart sounds. No murmur heard.   Pulmonary:     Effort: Pulmonary effort is normal.     Breath sounds: Normal breath  sounds. No wheezing, rhonchi or rales.  Musculoskeletal:        General: Normal range of motion.     Right lower leg: No edema.     Left lower leg: No edema.  Skin:    General: Skin is warm and dry.     Findings: No rash.  Neurological:     General: No focal deficit present.     Mental Status: He is alert and oriented to person, place, and time.     Cranial Nerves: No cranial nerve deficit.  Psychiatric:        Mood and Affect: Mood normal.        Behavior: Behavior normal.        Thought Content: Thought content normal.        Judgment: Judgment normal.      Assessment and Plan   1. Essential hypertension - CBC - Lipid panel - CMP14+EGFR  2. Impaired fasting blood sugar - CMP14+EGFR - Hemoglobin A1c  3. Morbid obesity ([place]) - Amb Referral to Bariatric Surgery - Hemoglobin A1c  4. Lumbar pain    htn- stable, cont meds. Check labs.  Impaired fasting glucose- recheck labs. Decrease carb intake.  Morbid obesity- wanting referral to bariatric surgery.   Lumbar pain- improving. Cont diclofenac, zanaflex prn. Heat/ice.  F/u [address] prn.

## 2020-08-03 ENCOUNTER — Encounter: Payer: Self-pay | Admitting: Family Medicine

## 2020-08-06 DIAGNOSIS — R7301 Impaired fasting glucose: Secondary | ICD-10-CM | POA: Diagnosis not present

## 2020-08-06 DIAGNOSIS — I10 Essential (primary) hypertension: Secondary | ICD-10-CM | POA: Diagnosis not present

## 2020-08-07 LAB — CMP14+EGFR
ALT: 52 IU/L — ABNORMAL HIGH (ref 0–44)
AST: 41 IU/L — ABNORMAL HIGH (ref 0–40)
Albumin/Globulin Ratio: 1.4 (ref 1.2–2.2)
Albumin: 4.4 g/dL (ref 4.0–5.0)
Alkaline Phosphatase: 57 IU/L (ref 48–121)
BUN/Creatinine Ratio: 15 (ref 9–20)
BUN: 13 mg/dL (ref 6–24)
Bilirubin Total: 0.5 mg/dL (ref 0.0–1.2)
CO2: 27 mmol/L (ref 20–29)
Calcium: 9.3 mg/dL (ref 8.7–10.2)
Chloride: 99 mmol/L (ref 96–106)
Creatinine, Ser: 0.88 mg/dL (ref 0.76–1.27)
GFR calc Af Amer: 116 mL/min/{1.73_m2} (ref >59)
GFR calc non Af Amer: 100 mL/min/{1.73_m2} (ref >59)
Globulin, Total: 3.1 g/dL (ref 1.5–4.5)
Glucose: 109 mg/dL — ABNORMAL HIGH (ref 65–99)
Potassium: 4.1 mmol/L (ref 3.5–5.2)
Sodium: 138 mmol/L (ref 134–144)
Total Protein: 7.5 g/dL (ref 6.0–8.5)

## 2020-08-07 LAB — CBC
Hematocrit: 47.3 % (ref 37.5–51.0)
Hemoglobin: 15.8 g/dL (ref 13.0–17.7)
MCH: 27.2 pg (ref 26.6–33.0)
MCHC: 33.4 g/dL (ref 31.5–35.7)
MCV: 82 fL (ref 79–97)
Platelets: 213 10*3/uL (ref 150–450)
RBC: 5.8 x10E6/uL (ref 4.14–5.80)
RDW: 14.1 % (ref 11.6–15.4)
WBC: 7.1 10*3/uL (ref 3.4–10.8)

## 2020-08-07 LAB — LIPID PANEL
Chol/HDL Ratio: 4.7 ratio (ref 0.0–5.0)
Cholesterol, Total: 175 mg/dL (ref 100–199)
HDL: 37 mg/dL — ABNORMAL LOW (ref >39)
LDL Chol Calc (NIH): 117 mg/dL — ABNORMAL HIGH (ref 0–99)
Triglycerides: 118 mg/dL (ref 0–149)
VLDL Cholesterol Cal: 21 mg/dL (ref 5–40)

## 2020-08-07 LAB — HEMOGLOBIN A1C
Est. average glucose Bld gHb Est-mCnc: 134 mg/dL
Hgb A1c MFr Bld: 6.3 % — ABNORMAL HIGH (ref 4.8–5.6)

## 2020-08-10 ENCOUNTER — Other Ambulatory Visit: Payer: Self-pay | Admitting: *Deleted

## 2020-08-10 DIAGNOSIS — I10 Essential (primary) hypertension: Secondary | ICD-10-CM

## 2020-08-10 DIAGNOSIS — E7800 Pure hypercholesterolemia, unspecified: Secondary | ICD-10-CM

## 2020-08-10 DIAGNOSIS — Z79899 Other long term (current) drug therapy: Secondary | ICD-10-CM

## 2020-08-10 DIAGNOSIS — R5383 Other fatigue: Secondary | ICD-10-CM

## 2020-08-10 DIAGNOSIS — R7309 Other abnormal glucose: Secondary | ICD-10-CM

## 2020-08-13 DIAGNOSIS — G4733 Obstructive sleep apnea (adult) (pediatric): Secondary | ICD-10-CM | POA: Diagnosis not present

## 2020-09-12 DIAGNOSIS — G4733 Obstructive sleep apnea (adult) (pediatric): Secondary | ICD-10-CM | POA: Diagnosis not present

## 2020-10-13 DIAGNOSIS — G4733 Obstructive sleep apnea (adult) (pediatric): Secondary | ICD-10-CM | POA: Diagnosis not present

## 2020-11-10 ENCOUNTER — Other Ambulatory Visit: Payer: Self-pay

## 2020-11-10 ENCOUNTER — Telehealth (INDEPENDENT_AMBULATORY_CARE_PROVIDER_SITE_OTHER): Payer: BC Managed Care – PPO | Admitting: Family Medicine

## 2020-11-10 DIAGNOSIS — R7301 Impaired fasting glucose: Secondary | ICD-10-CM

## 2020-11-10 DIAGNOSIS — I10 Essential (primary) hypertension: Secondary | ICD-10-CM | POA: Diagnosis not present

## 2020-11-10 DIAGNOSIS — I8390 Asymptomatic varicose veins of unspecified lower extremity: Secondary | ICD-10-CM | POA: Diagnosis not present

## 2020-11-10 NOTE — Progress Notes (Signed)
Patient ID: James Rivas, male    DOB: 1971/01/15, 49 y.o.   MRN: [id number]   Virtual Visit via Telephone Note  I connected with James Rivas on 11/10/20 at 10:00 AM EST by telephone and verified that I am speaking with the correct person using two identifiers.  Location: Patient: work Provider: home   I discussed the limitations, risks, security and privacy concerns of performing an evaluation and management service by telephone and the availability of in person appointments. I also discussed with the patient that there may be a patient responsible charge related to this service. The patient expressed understanding and agreed to proceed.    Chief Complaint  Patient presents with   Hyperglycemia   Follow-up    Patient following up on elevated glucose and cholesterol in September. Patient did not have labs drawn prior to this appointment.  Patient has concerns of varicose veins in his right leg, hx of varicose veins.   Subjective:    HPI  H/o prediabetes and f/u htn.  No montior at home. Glucose 109 on labs. a1c 6.3.  No inc in thirst or urination.  htn- taking chlorthalidone, enalapril.   hasn't taken one from October. Stopped the metoprolol on his own. Doing check up yearly with cards.  No concern of chest pain, ha, sob.   Pt having concern of varicose vein in rt leg.  Has bleed a few times.  Working a lot and 12 hrs per day and standing more.  Wearing compression stockings. Stops on own, not bleeding now. No pain in leg or swelling.   Medical History James Rivas has a past medical history of Allergy, Chest pain, HTN (hypertension), Impaired fasting glucose, Morbid obesity (HCC), Sleep apnea, and Venous stasis.   Outpatient Encounter Medications as of 11/10/2020  Medication Sig   chlorthalidone (HYGROTON) 25 MG tablet TAKE 1 TABLET(25 MG) BY MOUTH DAILY   cyclobenzaprine (FLEXERIL) 10 MG tablet Take 1 tablet (10 mg total) by mouth at bedtime.   diclofenac (VOLTAREN)  75 MG EC tablet TAKE 1 TABLET(75 MG) BY MOUTH TWICE DAILY WITH FOOD   enalapril (VASOTEC) 20 MG tablet TAKE 1 TABLET(20 MG) BY MOUTH DAILY   metoprolol tartrate (LOPRESSOR) 25 MG tablet Take 0.5 tablets (12.5 mg total) by mouth 2 (two) times daily. TAKE 1/2 TABLET(12.5 MG) BY MOUTH TWICE DAILY   tiZANidine (ZANAFLEX) 4 MG tablet Take 1 tablet (4 mg total) by mouth every 6 (six) hours as needed for muscle spasms.   No facility-administered encounter medications on file as of 11/10/2020.     Review of Systems  Constitutional: Negative for chills and fever.  HENT: Negative for congestion, rhinorrhea and sore throat.   Respiratory: Negative for cough, shortness of breath and wheezing.   Cardiovascular: Negative for chest pain and leg swelling.  Gastrointestinal: Negative for abdominal pain, diarrhea, nausea and vomiting.  Genitourinary: Negative for dysuria and frequency.  Musculoskeletal:       +dilated vein on rt lower leg.  Skin: Negative for rash.  Neurological: Negative for dizziness, weakness and headaches.     Vitals There were no vitals taken for this visit.  Objective:   Physical Exam No PE due to phone visit.  Assessment and Plan   1. Primary hypertension  2. Impaired fasting blood sugar  3. Varicose veins of lower extremity, unspecified laterality, unspecified whether complicated   htn- Pt stating doing well, but stopped 1 of the 3 bp meds he was on.  Stopped the metoprolol.  Still taking the chlorthalidone and enalapril.  Pt didn't get labs before the phone visit.  Impaired fasting glucose-  Pt to get labs before next visit.  H/o varicose veins- Examine leg in person.  Return to office if worsening.  Cont with compression stockings.  F/u 46mo in office visit.  Follow Up Instructions:    I discussed the assessment and treatment plan with the patient. The patient was provided an opportunity to ask questions and all were answered. The patient agreed with  the plan and demonstrated an understanding of the instructions.   The patient was advised to call back or seek an in-person evaluation if the symptoms worsen or if the condition fails to improve as anticipated.  I provided 15 minutes of non-face-to-face time during this encounter.   Taiesha Bovard Orie Fisherman, DO

## 2020-11-12 DIAGNOSIS — G4733 Obstructive sleep apnea (adult) (pediatric): Secondary | ICD-10-CM | POA: Diagnosis not present

## 2020-11-18 ENCOUNTER — Ambulatory Visit (INDEPENDENT_AMBULATORY_CARE_PROVIDER_SITE_OTHER): Payer: BC Managed Care – PPO | Admitting: Family Medicine

## 2020-11-18 ENCOUNTER — Encounter: Payer: Self-pay | Admitting: Family Medicine

## 2020-11-18 ENCOUNTER — Other Ambulatory Visit: Payer: Self-pay

## 2020-11-18 VITALS — BP 128/76 | HR 99 | Temp 98.0°F | Resp 16

## 2020-11-18 DIAGNOSIS — R059 Cough, unspecified: Secondary | ICD-10-CM | POA: Diagnosis not present

## 2020-11-18 DIAGNOSIS — H66001 Acute suppurative otitis media without spontaneous rupture of ear drum, right ear: Secondary | ICD-10-CM | POA: Diagnosis not present

## 2020-11-18 MED ORDER — AMOXICILLIN-POT CLAVULANATE 875-125 MG PO TABS: 1.0000 | ORAL_TABLET | Freq: Two times a day (BID) | ORAL | 0 refills | Status: DC

## 2020-11-18 MED ORDER — BENZONATATE 100 MG PO CAPS: 100.0000 mg | ORAL_CAPSULE | Freq: Two times a day (BID) | ORAL | 0 refills | Status: DC | PRN

## 2020-11-18 NOTE — Addendum Note (Signed)
Addended by: Marlowe Shores on: 11/18/2020 02:36 PM   Modules accepted: Orders

## 2020-11-18 NOTE — Progress Notes (Signed)
Patient presents today with respiratory illness Number of days present-2 days ago   Symptoms include- cough with mucus production, side becomes sore at times, congestion   Presence of worrisome signs (severe shortness of breath, lethargy, etc.) - none  Recent/current visit to urgent care or ER- none  Recent direct exposure to Covid- none  Any current Covid testing- none    Patient ID: James Rivas, male    DOB: 28-Jan-1971, 49 y.o.   MRN: [id number]   Chief Complaint  Patient presents with  . Cough   Subjective:  CC: cough with congestion   This is a new problem.  Presents today with a complaint of cough and congestion, and soreness on the right side with coughing.  The cough is a little better today.  Symptoms started 2 days ago.  Has tried Mucinex which has helped some pertinent negatives include no fever, no chills, no chest pain, no shortness of breath, no sore throat, no ear pain, no abdominal pain, no nausea vomiting or diarrhea.  He has no known Covid exposures, has had 2 of his Covid vaccines has not received his booster yet.  Encouraged him to get this booster as  soon as possible.    Medical History James Rivas has a past medical history of Allergy, Chest pain, HTN (hypertension), Impaired fasting glucose, Morbid obesity (HCC), Sleep apnea, and Venous stasis.   Outpatient Encounter Medications as of 11/18/2020  Medication Sig  . amoxicillin-clavulanate (AUGMENTIN) 875-125 MG tablet Take 1 tablet by mouth 2 (two) times daily.  . benzonatate (TESSALON) 100 MG capsule Take 1 capsule (100 mg total) by mouth 2 (two) times daily as needed for cough.  . chlorthalidone (HYGROTON) 25 MG tablet TAKE 1 TABLET(25 MG) BY MOUTH DAILY  . cyclobenzaprine (FLEXERIL) 10 MG tablet Take 1 tablet (10 mg total) by mouth at bedtime.  . diclofenac (VOLTAREN) 75 MG EC tablet TAKE 1 TABLET(75 MG) BY MOUTH TWICE DAILY WITH FOOD  . enalapril (VASOTEC) 20 MG tablet TAKE 1 TABLET(20 MG) BY MOUTH DAILY  .  metoprolol tartrate (LOPRESSOR) 25 MG tablet Take 0.5 tablets (12.5 mg total) by mouth 2 (two) times daily. TAKE 1/2 TABLET(12.5 MG) BY MOUTH TWICE DAILY  . tiZANidine (ZANAFLEX) 4 MG tablet Take 1 tablet (4 mg total) by mouth every 6 (six) hours as needed for muscle spasms.   No facility-administered encounter medications on file as of 11/18/2020.     Review of Systems  Constitutional: Negative for chills and fever.  HENT: Positive for congestion. Negative for ear pain.   Respiratory: Positive for cough. Negative for shortness of breath.   Cardiovascular: Negative for chest pain.  Gastrointestinal: Negative for abdominal pain.     Vitals BP 128/76   Pulse 99   Temp 98 F (36.7 C)   Resp 16   SpO2 98%   Objective:   Physical Exam Vitals reviewed.  Constitutional:      General: He is not in acute distress.    Appearance: He is not ill-appearing.  HENT:     Right Ear: Tympanic membrane is erythematous.     Left Ear: Tympanic membrane normal.     Nose: Congestion present.     Mouth/Throat:     Mouth: Mucous membranes are moist.  Cardiovascular:     Rate and Rhythm: Normal rate and regular rhythm.     Heart sounds: Normal heart sounds.  Pulmonary:     Effort: Pulmonary effort is normal.     Breath sounds: Normal  breath sounds.  Skin:    General: Skin is warm and dry.  Neurological:     General: No focal deficit present.     Mental Status: He is alert.  Psychiatric:        Behavior: Behavior normal.      Assessment and Plan   1. Non-recurrent acute suppurative otitis media of right ear without spontaneous rupture of tympanic membrane - amoxicillin-clavulanate (AUGMENTIN) 875-125 MG tablet; Take 1 tablet by mouth 2 (two) times daily.  Dispense: 20 tablet; Refill: 0  2. Cough in adult patient - benzonatate (TESSALON) 100 MG capsule; Take 1 capsule (100 mg total) by mouth 2 (two) times daily as needed for cough.  Dispense: 20 capsule; Refill: 0   Right ear  erythematous, denies pain, denies drainage, denies any hearing changes.  Prescription sent with the option of waiting to see if he wants to fill it.  Due to upcoming holiday wanted to give him the option in case symptoms worsen in the next 2 days.  Wishes for something stronger for cough, Tessalon Perles sent.  Agrees with plan of care discussed today. Understands warning signs to seek further care: Chest pain, shortness of breath, any significant change in health. Understands to follow-up if symptoms do not improve, or worsen.    Novella Olive, NP 11/18/2020

## 2020-11-19 ENCOUNTER — Encounter (HOSPITAL_COMMUNITY): Payer: Self-pay | Admitting: *Deleted

## 2020-11-19 ENCOUNTER — Other Ambulatory Visit: Payer: Self-pay

## 2020-11-19 ENCOUNTER — Emergency Department (HOSPITAL_COMMUNITY)
Admission: EM | Admit: 2020-11-19 | Discharge: 2020-11-20 | Disposition: A | Discharge status: Home / Self Care | Payer: BC Managed Care – PPO | Attending: Emergency Medicine | Admitting: Emergency Medicine

## 2020-11-19 ENCOUNTER — Emergency Department (HOSPITAL_COMMUNITY): Payer: BC Managed Care – PPO

## 2020-11-19 DIAGNOSIS — R0781 Pleurodynia: Secondary | ICD-10-CM | POA: Diagnosis not present

## 2020-11-19 DIAGNOSIS — I10 Essential (primary) hypertension: Secondary | ICD-10-CM | POA: Insufficient documentation

## 2020-11-19 DIAGNOSIS — G4733 Obstructive sleep apnea (adult) (pediatric): Secondary | ICD-10-CM | POA: Diagnosis not present

## 2020-11-19 DIAGNOSIS — K429 Umbilical hernia without obstruction or gangrene: Secondary | ICD-10-CM | POA: Diagnosis not present

## 2020-11-19 DIAGNOSIS — K5790 Diverticulosis of intestine, part unspecified, without perforation or abscess without bleeding: Secondary | ICD-10-CM | POA: Diagnosis not present

## 2020-11-19 DIAGNOSIS — R1031 Right lower quadrant pain: Secondary | ICD-10-CM | POA: Insufficient documentation

## 2020-11-19 DIAGNOSIS — R109 Unspecified abdominal pain: Secondary | ICD-10-CM

## 2020-11-19 DIAGNOSIS — E669 Obesity, unspecified: Secondary | ICD-10-CM | POA: Insufficient documentation

## 2020-11-19 DIAGNOSIS — R0902 Hypoxemia: Secondary | ICD-10-CM | POA: Diagnosis not present

## 2020-11-19 DIAGNOSIS — K7689 Other specified diseases of liver: Secondary | ICD-10-CM | POA: Diagnosis not present

## 2020-11-19 DIAGNOSIS — M549 Dorsalgia, unspecified: Secondary | ICD-10-CM | POA: Diagnosis not present

## 2020-11-19 DIAGNOSIS — U071 COVID-19: Secondary | ICD-10-CM | POA: Insufficient documentation

## 2020-11-19 DIAGNOSIS — R52 Pain, unspecified: Secondary | ICD-10-CM | POA: Diagnosis not present

## 2020-11-19 LAB — CBC WITH DIFFERENTIAL/PLATELET
Abs Immature Granulocytes: 0.01 10*3/uL (ref 0.00–0.07)
Basophils Absolute: 0 10*3/uL (ref 0.0–0.1)
Basophils Relative: 0 %
Eosinophils Absolute: 0 10*3/uL (ref 0.0–0.5)
Eosinophils Relative: 0 %
HCT: 47.9 % (ref 39.0–52.0)
Hemoglobin: 15.4 g/dL (ref 13.0–17.0)
Immature Granulocytes: 0 %
Lymphocytes Relative: 25 %
Lymphs Abs: 1.4 10*3/uL (ref 0.7–4.0)
MCH: 27.5 pg (ref 26.0–34.0)
MCHC: 32.2 g/dL (ref 30.0–36.0)
MCV: 85.7 fL (ref 80.0–100.0)
Monocytes Absolute: 0.7 10*3/uL (ref 0.1–1.0)
Monocytes Relative: 12 %
Neutro Abs: 3.5 10*3/uL (ref 1.7–7.7)
Neutrophils Relative %: 63 %
Platelets: 189 10*3/uL (ref 150–400)
RBC: 5.59 MIL/uL (ref 4.22–5.81)
RDW: 14.7 % (ref 11.5–15.5)
WBC: 5.7 10*3/uL (ref 4.0–10.5)
nRBC: 0 % (ref 0.0–0.2)

## 2020-11-19 LAB — COMPREHENSIVE METABOLIC PANEL
ALT: 48 U/L — ABNORMAL HIGH (ref 0–44)
AST: 43 U/L — ABNORMAL HIGH (ref 15–41)
Albumin: 4 g/dL (ref 3.5–5.0)
Alkaline Phosphatase: 39 U/L (ref 38–126)
Anion gap: 9 (ref 5–15)
BUN: 18 mg/dL (ref 6–20)
CO2: 27 mmol/L (ref 22–32)
Calcium: 8.6 mg/dL — ABNORMAL LOW (ref 8.9–10.3)
Chloride: 97 mmol/L — ABNORMAL LOW (ref 98–111)
Creatinine, Ser: 1.23 mg/dL (ref 0.61–1.24)
GFR, Estimated: >60 mL/min (ref >60)
Glucose, Bld: 97 mg/dL (ref 70–99)
Potassium: 3.8 mmol/L (ref 3.5–5.1)
Sodium: 133 mmol/L — ABNORMAL LOW (ref 135–145)
Total Bilirubin: 0.5 mg/dL (ref 0.3–1.2)
Total Protein: 8.1 g/dL (ref 6.5–8.1)

## 2020-11-19 LAB — URINALYSIS, ROUTINE W REFLEX MICROSCOPIC
Bilirubin Urine: NEGATIVE
Glucose, UA: NEGATIVE mg/dL
Hgb urine dipstick: NEGATIVE
Ketones, ur: NEGATIVE mg/dL
Leukocytes,Ua: NEGATIVE
Nitrite: NEGATIVE
Protein, ur: NEGATIVE mg/dL
Specific Gravity, Urine: 1.019 (ref 1.005–1.030)
pH: 5 (ref 5.0–8.0)

## 2020-11-19 LAB — LIPASE, BLOOD: Lipase: 46 U/L (ref 11–51)

## 2020-11-19 LAB — RESP PANEL BY RT-PCR (FLU A&B, COVID) ARPGX2
Influenza A by PCR: NEGATIVE
Influenza B by PCR: NEGATIVE
SARS Coronavirus 2 by RT PCR: POSITIVE — AB

## 2020-11-19 MED ORDER — KETOROLAC TROMETHAMINE 60 MG/2ML IM SOLN
60.0000 mg | Freq: Once | INTRAMUSCULAR | Status: AC
  Administered 2020-11-19: 19:00:00 60 mg via INTRAMUSCULAR
  Filled 2020-11-19: qty 2

## 2020-11-19 MED ORDER — SODIUM CHLORIDE 0.9 % IV SOLN: INTRAVENOUS | Status: DC | PRN

## 2020-11-19 MED ORDER — SODIUM CHLORIDE 0.9 % IV SOLN
1200.0000 mg | Freq: Once | INTRAVENOUS | Status: AC
  Administered 2020-11-19: 22:00:00 1200 mg via INTRAVENOUS
  Filled 2020-11-19: qty 10

## 2020-11-19 MED ORDER — ALBUTEROL SULFATE HFA 108 (90 BASE) MCG/ACT IN AERS: 2.0000 | INHALATION_SPRAY | Freq: Once | RESPIRATORY_TRACT | Status: DC | PRN

## 2020-11-19 MED ORDER — METHYLPREDNISOLONE SODIUM SUCC 125 MG IJ SOLR: 125.0000 mg | Freq: Once | INTRAMUSCULAR | Status: DC | PRN

## 2020-11-19 MED ORDER — FAMOTIDINE IN NACL 20-0.9 MG/50ML-% IV SOLN: 20.0000 mg | Freq: Once | INTRAVENOUS | Status: DC | PRN

## 2020-11-19 MED ORDER — OXYCODONE-ACETAMINOPHEN 5-325 MG PO TABS
2.0000 | ORAL_TABLET | Freq: Once | ORAL | Status: AC
  Administered 2020-11-19: 19:00:00 2 via ORAL
  Filled 2020-11-19: qty 2

## 2020-11-19 MED ORDER — DIPHENHYDRAMINE HCL 50 MG/ML IJ SOLN: 50.0000 mg | Freq: Once | INTRAMUSCULAR | Status: DC | PRN

## 2020-11-19 MED ORDER — EPINEPHRINE 0.3 MG/0.3ML IJ SOAJ: 0.3000 mg | Freq: Once | INTRAMUSCULAR | Status: DC | PRN

## 2020-11-19 NOTE — ED Provider Notes (Signed)
[facility] EMERGENCY DEPARTMENT Provider Note   CSN: [id number] Arrival date & time: 11/19/20  1811     History Chief Complaint  Patient presents with  . Back Pain    James Rivas is a 49 y.o. male.  Patient is a 49 year old male with history of hypertension, obstructive sleep apnea, obesity.  He presents today for evaluation of right flank pain.  This started several days ago in the absence of any injury or trauma.  He does report recent fever and cough.  He was seen by his primary doctor who obtained a Covid test, however he does not know the results of this.  He was prescribed Augmentin for possible ear infection.  He denies any urinary complaints.  He denies any bowel complaints.  The history is provided by the patient.       Past Medical History:  Diagnosis Date  . Allergy   . Chest pain   . HTN (hypertension)   . Impaired fasting glucose   . Morbid obesity (HCC)   . Sleep apnea    uses a CPAP  . Venous stasis     Patient Active Problem List   Diagnosis Date Noted  . Non-recurrent acute suppurative otitis media of right ear without spontaneous rupture of tympanic membrane 11/18/2020  . Cough in adult patient 11/18/2020  . Colon cancer screening 03/22/2020  . Obstructive sleep apnea 01/05/2020  . Morbid obesity (HCC) 10/15/2016  . Allergic rhinitis 01/06/2015  . Prostate hypertrophy 01/06/2015  . Venous stasis 03/23/2014  . Venous stasis dermatitis 03/23/2014  . Chronic chest pain 06/22/2013  . Dyspnea 05/18/2013  . Chest pain   . HTN (hypertension)     Past Surgical History:  Procedure Laterality Date  . COLONOSCOPY WITH PROPOFOL N/A 05/11/2020   Procedure: COLONOSCOPY WITH PROPOFOL;  Surgeon: Corbin Ade, MD;  Location: [facility] ENDO SUITE;  Service: Endoscopy;  Laterality: N/A;  2:15pm  . FINGER SURGERY Left    left ring finger 2 surgeries  . I & D EXTREMITY  11/29/2011   Procedure: IRRIGATION AND DEBRIDEMENT EXTREMITY;  Surgeon: Sharma Covert;   Location: [facility] OR;  Service: Orthopedics;  Laterality: Left;  I&D Right Long and Index Fingers with Tendon Repair as Needed       Family History  Problem Relation Age of Onset  . Diabetes Father   . Diabetes Paternal Aunt   . Colon cancer Neg Hx     Social History   Tobacco Use  . Smoking status: Never Smoker  . Smokeless tobacco: Never Used  Vaping Use  . Vaping Use: Never used  Substance Use Topics  . Alcohol use: Not Currently    Comment: occ. 1 beer or a glass of wine a couple times a week.   . Drug use: No    Home Medications Prior to Admission medications   Medication Sig Start Date End Date Taking? Authorizing Provider  amoxicillin-clavulanate (AUGMENTIN) 875-125 MG tablet Take 1 tablet by mouth 2 (two) times daily. 11/18/20   Novella Olive, NP  benzonatate (TESSALON) 100 MG capsule Take 1 capsule (100 mg total) by mouth 2 (two) times daily as needed for cough. 11/18/20   Novella Olive, NP  chlorthalidone (HYGROTON) 25 MG tablet TAKE 1 TABLET(25 MG) BY MOUTH DAILY 07/30/20   Laroy Apple M, DO  cyclobenzaprine (FLEXERIL) 10 MG tablet Take 1 tablet (10 mg total) by mouth at bedtime. 05/03/20   Wurst, [place], PA-C  diclofenac (VOLTAREN) 75 MG EC  tablet TAKE 1 TABLET(75 MG) BY MOUTH TWICE DAILY WITH FOOD 06/01/20   Lilyan Punt A, MD  enalapril (VASOTEC) 20 MG tablet TAKE 1 TABLET(20 MG) BY MOUTH DAILY 07/30/20   Ladona Ridgel, Malena M, DO  metoprolol tartrate (LOPRESSOR) 25 MG tablet Take 0.5 tablets (12.5 mg total) by mouth 2 (two) times daily. TAKE 1/2 TABLET(12.5 MG) BY MOUTH TWICE DAILY 07/30/20   Ladona Ridgel, Malena M, DO  tiZANidine (ZANAFLEX) 4 MG tablet Take 1 tablet (4 mg total) by mouth every 6 (six) hours as needed for muscle spasms. 01/21/20   Merlyn Albert, MD    Allergies    Patient has no known allergies.  Review of Systems   Review of Systems  All other systems reviewed and are negative.   Physical Exam Updated Vital Signs BP 136/74 (BP Location: Right Arm)    Pulse 91   Temp (!) 101.6 F (38.7 C) (Oral)   Resp 18   Ht 6\' 5"  (1.956 m)   Wt (!) 158.7 kg   SpO2 95%   BMI 41.50 kg/m   Physical Exam Vitals and nursing note reviewed.  Constitutional:      General: He is not in acute distress.    Appearance: He is well-developed and well-nourished. He is not diaphoretic.  HENT:     Head: Normocephalic and atraumatic.     Mouth/Throat:     Mouth: Oropharynx is clear and moist.  Cardiovascular:     Rate and Rhythm: Normal rate and regular rhythm.     Heart sounds: No murmur heard. No friction rub.  Pulmonary:     Effort: Pulmonary effort is normal. No respiratory distress.     Breath sounds: Normal breath sounds. No wheezing or rales.  Abdominal:     General: Bowel sounds are normal. There is no distension.     Palpations: Abdomen is soft.     Tenderness: There is no abdominal tenderness. There is right CVA tenderness. There is no left CVA tenderness, guarding or rebound.  Musculoskeletal:        General: No edema. Normal range of motion.     Cervical back: Normal range of motion and neck supple.  Skin:    General: Skin is warm and dry.  Neurological:     Mental Status: He is alert and oriented to person, place, and time.     Coordination: Coordination normal.     ED Results / Procedures / Treatments   Labs (all labs ordered are listed, but only abnormal results are displayed) Labs Reviewed  RESP PANEL BY RT-PCR (FLU A&B, COVID) ARPGX2  COMPREHENSIVE METABOLIC PANEL  CBC WITH DIFFERENTIAL/PLATELET  LIPASE, BLOOD  URINALYSIS, ROUTINE W REFLEX MICROSCOPIC    EKG None  Radiology No results found.  Procedures Procedures (including critical care time)  Medications Ordered in ED Medications  ketorolac (TORADOL) injection 60 mg (has no administration in time range)  oxyCODONE-acetaminophen (PERCOCET/ROXICET) 5-325 MG per tablet 2 tablet (has no administration in time range)    ED Course  I have reviewed the triage vital  signs and the nursing notes.  Pertinent labs & imaging results that were available during my care of the patient were reviewed by me and considered in my medical decision making (see chart for details).    MDM Rules/Calculators/A&P  Patient presenting with complaints of right flank pain along with recent fever and cough.  Patient's work-up shows a positive Covid swab, but negative renal CT.  Patient agreeable to monoclonal antibodies.  These  will be infused and patient discharged afterward.  There is no hypoxia and vital signs are otherwise stable.  Final Clinical Impression(s) / ED Diagnoses Final diagnoses:  None    Rx / DC Orders ED Discharge Orders    None       Geoffery Lyons, MD 11/23/20 1540

## 2020-11-19 NOTE — ED Triage Notes (Signed)
Pt brought in by [facility] with c/o back pain that radiates around to right side x 2 days. Pt started having a cough that started Tuesday. Pt went to PCP yesterday and had a Covid test done, but results are pending. Pt was also given Amoxicillin for the cough.

## 2020-11-19 NOTE — Discharge Instructions (Signed)
Drink plenty of fluids and get plenty of rest.  Take Tylenol 1000 mg rotated with ibuprofen 600 mg every 4 hours as needed for pain or fever.  Return to the emergency department if you develop worsening breathing, severe chest pain, or other new and concerning symptoms.       Person Under Monitoring Name: James Rivas  Location: [address] [place] [address]   Infection Prevention Recommendations for Individuals Confirmed to have, or Being Evaluated for, 2019 Novel Coronavirus (COVID-19) Infection Who Receive Care at Home  Individuals who are confirmed to have, or are being evaluated for, COVID-19 should follow the prevention steps below until a healthcare provider or local or state health department says they can return to normal activities.  Stay home except to get medical care You should restrict activities outside your home, except for getting medical care. Do not go to work, school, or public areas, and do not use public transportation or taxis.  Call ahead before visiting your doctor Before your medical appointment, call the healthcare provider and tell them that you have, or are being evaluated for, COVID-19 infection. This will help the healthcare provider's office take steps to keep other people from getting infected. Ask your healthcare provider to call the local or state health department.  Monitor your symptoms Seek prompt medical attention if your illness is worsening (e.g., difficulty breathing). Before going to your medical appointment, call the healthcare provider and tell them that you have, or are being evaluated for, COVID-19 infection. Ask your healthcare provider to call the local or state health department.  Wear a facemask You should wear a facemask that covers your nose and mouth when you are in the same room with other people and when you visit a healthcare provider. People who live with or visit you should also wear a facemask while they are  in the same room with you.  Separate yourself from other people in your home As much as possible, you should stay in a different room from other people in your home. Also, you should use a separate bathroom, if available.  Avoid sharing household items You should not share dishes, drinking glasses, cups, eating utensils, towels, bedding, or other items with other people in your home. After using these items, you should wash them thoroughly with soap and water.  Cover your coughs and sneezes Cover your mouth and nose with a tissue when you cough or sneeze, or you can cough or sneeze into your sleeve. Throw used tissues in a lined trash can, and immediately wash your hands with soap and water for at least 20 seconds or use an alcohol-based hand rub.  Wash your [employer] your hands often and thoroughly with soap and water for at least 20 seconds. You can use an alcohol-based hand sanitizer if soap and water are not available and if your hands are not visibly dirty. Avoid touching your eyes, nose, and mouth with unwashed hands.   Prevention Steps for Caregivers and Household Members of Individuals Confirmed to have, or Being Evaluated for, COVID-19 Infection Being Cared for in the Home  If you live with, or provide care at home for, a person confirmed to have, or being evaluated for, COVID-19 infection please follow these guidelines to prevent infection:  Follow healthcare provider's instructions Make sure that you understand and can help the patient follow any healthcare provider instructions for all care.  Provide for the patient's basic needs You should help the patient with basic  needs in the home and provide support for getting groceries, prescriptions, and other personal needs.  Monitor the patient's symptoms If they are getting sicker, call his or her medical provider and tell them that the patient has, or is being evaluated for, COVID-19 infection. This will help the  healthcare provider's office take steps to keep other people from getting infected. Ask the healthcare provider to call the local or state health department.  Limit the number of people who have contact with the patient If possible, have only one caregiver for the patient. Other household members should stay in another home or place of residence. If this is not possible, they should stay in another room, or be separated from the patient as much as possible. Use a separate bathroom, if available. Restrict visitors who do not have an essential need to be in the home.  Keep older adults, very young children, and other sick people away from the patient Keep older adults, very young children, and those who have compromised immune systems or chronic health conditions away from the patient. This includes people with chronic heart, lung, or kidney conditions, diabetes, and cancer.  Ensure good ventilation Make sure that shared spaces in the home have good air flow, such as from an air conditioner or an opened window, weather permitting.  Wash your hands often Wash your hands often and thoroughly with soap and water for at least 20 seconds. You can use an alcohol based hand sanitizer if soap and water are not available and if your hands are not visibly dirty. Avoid touching your eyes, nose, and mouth with unwashed hands. Use disposable paper towels to dry your hands. If not available, use dedicated cloth towels and replace them when they become wet.  Wear a facemask and gloves Wear a disposable facemask at all times in the room and gloves when you touch or have contact with the patient's blood, body fluids, and/or secretions or excretions, such as sweat, saliva, sputum, nasal mucus, vomit, urine, or feces.  Ensure the mask fits over your nose and mouth tightly, and do not touch it during use. Throw out disposable facemasks and gloves after using them. Do not reuse. Wash your hands immediately after  removing your facemask and gloves. If your personal clothing becomes contaminated, carefully remove clothing and launder. Wash your hands after handling contaminated clothing. Place all used disposable facemasks, gloves, and other waste in a lined container before disposing them with other household waste. Remove gloves and wash your hands immediately after handling these items.  Do not share dishes, glasses, or other household items with the patient Avoid sharing household items. You should not share dishes, drinking glasses, cups, eating utensils, towels, bedding, or other items with a patient who is confirmed to have, or being evaluated for, COVID-19 infection. After the person uses these items, you should wash them thoroughly with soap and water.  Wash laundry thoroughly Immediately remove and wash clothes or bedding that have blood, body fluids, and/or secretions or excretions, such as sweat, saliva, sputum, nasal mucus, vomit, urine, or feces, on them. Wear gloves when handling laundry from the patient. Read and follow directions on labels of laundry or clothing items and detergent. In general, wash and dry with the warmest temperatures recommended on the label.  Clean all areas the individual has used often Clean all touchable surfaces, such as counters, tabletops, doorknobs, bathroom fixtures, toilets, phones, keyboards, tablets, and bedside tables, every day. Also, clean any surfaces that may have blood,  body fluids, and/or secretions or excretions on them. Wear gloves when cleaning surfaces the patient has come in contact with. Use a diluted bleach solution (e.g., dilute bleach with 1 part bleach and 10 parts water) or a household disinfectant with a label that says EPA-registered for coronaviruses. To make a bleach solution at home, add 1 tablespoon of bleach to 1 quart (4 cups) of water. For a larger supply, add  cup of bleach to 1 gallon (16 cups) of water. Read labels of cleaning  products and follow recommendations provided on product labels. Labels contain instructions for safe and effective use of the cleaning product including precautions you should take when applying the product, such as wearing gloves or eye protection and making sure you have good ventilation during use of the product. Remove gloves and wash hands immediately after cleaning.  Monitor yourself for signs and symptoms of illness Caregivers and household members are considered close contacts, should monitor their health, and will be asked to limit movement outside of the home to the extent possible. Follow the monitoring steps for close contacts listed on the symptom monitoring form.   ? If you have additional questions, contact your local health department or call the epidemiologist on call at [phone] (available 24/7). ? This guidance is subject to change. For the most up-to-date guidance from [facility], please refer to their website: [email or web address]

## 2020-11-20 LAB — NOVEL CORONAVIRUS, NAA: SARS-CoV-2, NAA: DETECTED — AB

## 2020-11-20 LAB — SARS-COV-2, NAA 2 DAY TAT

## 2020-11-20 LAB — SPECIMEN STATUS REPORT

## 2020-11-20 NOTE — ED Notes (Signed)
Patient discharged to home.  All discharge instructions reviewed.  Patient receptive and able to demonstrate understanding via teachback method.  Ambulatory out of ED.

## 2020-11-23 ENCOUNTER — Telehealth: Payer: Self-pay

## 2020-11-23 NOTE — Telephone Encounter (Signed)
Covid [address] [facility] [employer] called and give three places that he could go but would have to have referral for the antibodies   [facility] on [address]  [facility]  [address] [place]   Pt call [phone]

## 2020-11-23 NOTE — Telephone Encounter (Signed)
Pt called back and was informed about [facility] doing monoclonial antibody injections.

## 2020-11-23 NOTE — Telephone Encounter (Signed)
Please advise. Thank you

## 2020-11-23 NOTE — Telephone Encounter (Signed)
Pls give order for covid antibody testing.  Dr. Ladona Ridgel

## 2020-11-24 NOTE — Telephone Encounter (Signed)
Pt information given to infusion clinic yesterday. Contacted [facility], [facility] and that state they do not do infusion.

## 2020-11-25 ENCOUNTER — Emergency Department (HOSPITAL_COMMUNITY)
Admission: EM | Admit: 2020-11-25 | Discharge: 2020-11-25 | Disposition: A | Discharge status: Home / Self Care | Payer: BC Managed Care – PPO | Attending: Emergency Medicine | Admitting: Emergency Medicine

## 2020-11-25 ENCOUNTER — Other Ambulatory Visit: Payer: Self-pay

## 2020-11-25 ENCOUNTER — Encounter (HOSPITAL_COMMUNITY): Payer: Self-pay | Admitting: *Deleted

## 2020-11-25 DIAGNOSIS — R42 Dizziness and giddiness: Secondary | ICD-10-CM | POA: Insufficient documentation

## 2020-11-25 DIAGNOSIS — U071 COVID-19: Secondary | ICD-10-CM | POA: Insufficient documentation

## 2020-11-25 DIAGNOSIS — Z79899 Other long term (current) drug therapy: Secondary | ICD-10-CM | POA: Diagnosis not present

## 2020-11-25 DIAGNOSIS — I10 Essential (primary) hypertension: Secondary | ICD-10-CM | POA: Diagnosis not present

## 2020-11-25 DIAGNOSIS — M7918 Myalgia, other site: Secondary | ICD-10-CM | POA: Diagnosis not present

## 2020-11-25 MED ORDER — OXYCODONE-ACETAMINOPHEN 5-325 MG PO TABS
1.0000 | ORAL_TABLET | ORAL | Status: AC | PRN
  Administered 2020-11-25 (×2): 1 via ORAL
  Filled 2020-11-25 (×2): qty 1

## 2020-11-25 NOTE — ED Notes (Signed)
After reading PA noted of patient complaint of dizziness, reassessment performed. Pt now states he did come in for dizziness but has went away. Denies numbness/tingling/trouble speaking or swallowing/denies weakness on one side more than the other. Speech is clear. See PA noted. Nad.

## 2020-11-25 NOTE — ED Notes (Signed)
Pt tested covid postive dec 24. Pt c/o upper abd pain. Denies diarrhea/constipation. Denies n/v/d. Nad.

## 2020-11-25 NOTE — ED Provider Notes (Signed)
[facility] EMERGENCY DEPARTMENT Provider Note   CSN: [id number] Arrival date & time: 11/25/20  1438     History Chief Complaint  Patient presents with  . Covid Positive    James Rivas is a 49 y.o. male.  HPI   Pt is a 49 y/o male with a h/o allergies, chest pain, HTN, morbid obesity, sleep apnea, who presents to the ED today for eval of dizziness/lightheadedness. States he was diagnosed with covid earlier this week. He has had body aches, generalized weakness. States that he had an episode of lightheadedness that occurred after standing up and walking to the bathroom. Denies syncope or vertigo. States sxs lasted for a few seconds. After this he drank some gatorade and his symptoms improved.   Denies headaches, vision changes, or unilateral numbness/weakness. Denies chest pain or shortness of breath.   States he has had decreased po intake due to decreased appetite. He has been trying to drink water.   Past Medical History:  Diagnosis Date  . Allergy   . Chest pain   . HTN (hypertension)   . Impaired fasting glucose   . Morbid obesity (HCC)   . Sleep apnea    uses a CPAP  . Venous stasis     Patient Active Problem List   Diagnosis Date Noted  . Non-recurrent acute suppurative otitis media of right ear without spontaneous rupture of tympanic membrane 11/18/2020  . Cough in adult patient 11/18/2020  . Colon cancer screening 03/22/2020  . Obstructive sleep apnea 01/05/2020  . Morbid obesity (HCC) 10/15/2016  . Allergic rhinitis 01/06/2015  . Prostate hypertrophy 01/06/2015  . Venous stasis 03/23/2014  . Venous stasis dermatitis 03/23/2014  . Chronic chest pain 06/22/2013  . Dyspnea 05/18/2013  . Chest pain   . HTN (hypertension)     Past Surgical History:  Procedure Laterality Date  . COLONOSCOPY WITH PROPOFOL N/A 05/11/2020   Procedure: COLONOSCOPY WITH PROPOFOL;  Surgeon: Corbin Ade, MD;  Location: [facility] SUITE;  Service: Endoscopy;  Laterality: N/A;   2:15pm  . FINGER SURGERY Left    left ring finger 2 surgeries  . I & D EXTREMITY  11/29/2011   Procedure: IRRIGATION AND DEBRIDEMENT EXTREMITY;  Surgeon: Sharma Covert;  Location: [facility] OR;  Service: Orthopedics;  Laterality: Left;  I&D Right Long and Index Fingers with Tendon Repair as Needed       Family History  Problem Relation Age of Onset  . Diabetes Father   . Diabetes Paternal Aunt   . Colon cancer Neg Hx     Social History   Tobacco Use  . Smoking status: Never Smoker  . Smokeless tobacco: Never Used  Vaping Use  . Vaping Use: Never used  Substance Use Topics  . Alcohol use: Not Currently    Comment: occ. 1 beer or a glass of wine a couple times a week.   . Drug use: No    Home Medications Prior to Admission medications   Medication Sig Start Date End Date Taking? Authorizing Provider  amoxicillin-clavulanate (AUGMENTIN) 875-125 MG tablet Take 1 tablet by mouth 2 (two) times daily. Patient not taking: Reported on 11/19/2020 11/18/20   Novella Olive, NP  benzonatate (TESSALON) 100 MG capsule Take 1 capsule (100 mg total) by mouth 2 (two) times daily as needed for cough. Patient not taking: Reported on 11/19/2020 11/18/20   Novella Olive, NP  chlorthalidone (HYGROTON) 25 MG tablet TAKE 1 TABLET(25 MG) BY MOUTH DAILY Patient taking  differently: Take 25 mg by mouth daily. TAKE 1 TABLET(25 MG) BY MOUTH DAILY 07/30/20   Laroy Apple M, DO  cyclobenzaprine (FLEXERIL) 10 MG tablet Take 1 tablet (10 mg total) by mouth at bedtime. 05/03/20   Wurst, [place], PA-C  diclofenac (VOLTAREN) 75 MG EC tablet TAKE 1 TABLET(75 MG) BY MOUTH TWICE DAILY WITH FOOD Patient taking differently: Take 75 mg by mouth daily as needed for mild pain or moderate pain. 06/01/20   Babs Sciara, MD  enalapril (VASOTEC) 20 MG tablet TAKE 1 TABLET(20 MG) BY MOUTH DAILY Patient taking differently: Take 20 mg by mouth daily. TAKE 1 TABLET(20 MG) BY MOUTH DAILY 07/30/20   Laroy Apple M, DO  metoprolol  tartrate (LOPRESSOR) 25 MG tablet Take 0.5 tablets (12.5 mg total) by mouth 2 (two) times daily. TAKE 1/2 TABLET(12.5 MG) BY MOUTH TWICE DAILY 07/30/20   Laroy Apple M, DO  Phenylephrine-APAP-guaiFENesin (MUCINEX FAST-MAX) 10-650-400 MG/20ML LIQD Take 20 mLs by mouth daily as needed (for cold/flu).    [provider]  tiZANidine (ZANAFLEX) 4 MG tablet Take 1 tablet (4 mg total) by mouth every 6 (six) hours as needed for muscle spasms. 01/21/20   Merlyn Albert, MD    Allergies    Patient has no known allergies.  Review of Systems   Review of Systems  Constitutional: Negative for fever.  HENT: Negative for ear pain and sore throat.   Eyes: Negative for visual disturbance.  Respiratory: Negative for cough and shortness of breath.   Cardiovascular: Negative for chest pain.  Gastrointestinal: Negative for abdominal pain, constipation, diarrhea, nausea and vomiting.  Genitourinary: Negative for dysuria and hematuria.  Musculoskeletal: Positive for myalgias. Negative for back pain.  Skin: Negative for color change and rash.  Neurological: Positive for weakness (generalized) and light-headedness.  All other systems reviewed and are negative.   Physical Exam Updated Vital Signs BP (!) 145/95 (BP Location: Right Wrist)   Pulse 67   Temp 98.2 F (36.8 C) (Oral)   Resp 20   SpO2 99%   Physical Exam Vitals and nursing note reviewed.  Constitutional:      Appearance: He is well-developed and well-nourished.  HENT:     Head: Normocephalic and atraumatic.  Eyes:     Conjunctiva/sclera: Conjunctivae normal.  Cardiovascular:     Rate and Rhythm: Normal rate and regular rhythm.     Heart sounds: Normal heart sounds. No murmur heard.   Pulmonary:     Effort: Pulmonary effort is normal. No respiratory distress.     Breath sounds: Normal breath sounds. No wheezing, rhonchi or rales.  Abdominal:     General: Bowel sounds are normal.     Palpations: Abdomen is soft.      Tenderness: There is no abdominal tenderness. There is no guarding or rebound.  Musculoskeletal:        General: No edema.     Cervical back: Neck supple.  Skin:    General: Skin is warm and dry.  Neurological:     Mental Status: He is alert.     Comments: Mental Status:  Alert, thought content appropriate, able to give a coherent history. Speech fluent without evidence of aphasia. Able to follow 2 step commands without difficulty.  Cranial Nerves:  II: pupils equal, round, reactive to light III,IV, VI: ptosis not present, extra-ocular motions intact bilaterally  V,VII: smile symmetric, facial light touch sensation equal VIII: hearing grossly normal to voice  X: uvula elevates symmetrically  XI: bilateral  shoulder shrug symmetric and strong XII: midline tongue extension without fassiculations Motor:  Normal tone. 5/5 strength of BUE and BLE major muscle groups including strong and equal grip strength and dorsiflexion/plantar flexion Sensory: light touch normal in all extremities. Cerebellar: normal finger-to-nose with bilateral upper extremities Gait: normal gait and balance.   Psychiatric:        Mood and Affect: Mood and affect normal.     ED Results / Procedures / Treatments   Labs (all labs ordered are listed, but only abnormal results are displayed) Labs Reviewed - No data to display  EKG None  Radiology No results found.  Procedures Procedures (including critical care time)  Medications Ordered in ED Medications  oxyCODONE-acetaminophen (PERCOCET/ROXICET) 5-325 MG per tablet 1 tablet (1 tablet Oral Given 11/25/20 2013)    ED Course  I have reviewed the triage vital signs and the nursing notes.  Pertinent labs & imaging results that were available during my care of the patient were reviewed by me and considered in my medical decision making (see chart for details).    MDM Rules/Calculators/A&P                          Patient presenting for evaluation for  Covid sxs. He was concerned today because he had an episode of lightheadedness that occurred upon standing.  He does report reduced p.o. intake for the last few days because he has not had an appetite.  Patient nontoxic, well-appearing, no distress.  Vital signs are reassuring.  Neuro exam is normal.  I suspect that the patient is dehydrated and this is likely the cause of his lightheadedness.  I see no indication clinically that he is having an emergent neurologic, cardiac or pulmonary cause of his symptoms.  His EKG is unchanged from prior and shows no evidence of ischemia, infarct or arrhythmia.  He is advised to push fluids at home advised on quarantine measures. Will give Rx for symptomatic management. Advised on f/u and return precautions. Pt voiced understanding of the plan and reasons to return. All questions answered, pt stable for d/c.  James Rivas was evaluated in Emergency Department on 11/25/2020 for the symptoms described in the history of present illness. He was evaluated in the context of the global COVID-19 pandemic, which necessitated consideration that the patient might be at risk for infection with the SARS-CoV-2 virus that causes COVID-19. Institutional protocols and algorithms that pertain to the evaluation of patients at risk for COVID-19 are in a state of rapid change based on information released by regulatory bodies including the CDC and federal and state organizations. These policies and algorithms were followed during the patient's care in the ED.   Final Clinical Impression(s) / ED Diagnoses Final diagnoses:  COVID  Lightheaded    Rx / DC Orders ED Discharge Orders    None       Rayne Du 11/25/20 2027    Derwood Kaplan, MD 11/26/20 1538

## 2020-11-25 NOTE — ED Notes (Signed)
ekg given to Dr. Rhunette Croft

## 2020-11-25 NOTE — Discharge Instructions (Signed)
Your EKG was reassuring.  I did not show any abnormal heart rhythms or changes from your prior EKGs.  You will need to push fluids and make sure that you are staying well-hydrated at home.  Please follow-up with your regular doctor and return to the emergency department for new or worsening symptoms in the meantime.

## 2020-12-01 ENCOUNTER — Other Ambulatory Visit: Payer: Self-pay

## 2020-12-01 ENCOUNTER — Encounter: Payer: Self-pay | Admitting: Family Medicine

## 2020-12-01 ENCOUNTER — Ambulatory Visit (INDEPENDENT_AMBULATORY_CARE_PROVIDER_SITE_OTHER): Payer: BC Managed Care – PPO | Admitting: Family Medicine

## 2020-12-01 VITALS — BP 140/90 | HR 85 | Temp 98.6°F | Resp 16

## 2020-12-01 DIAGNOSIS — J1282 Pneumonia due to coronavirus disease 2019: Secondary | ICD-10-CM | POA: Diagnosis not present

## 2020-12-01 DIAGNOSIS — Z8616 Personal history of COVID-19: Secondary | ICD-10-CM | POA: Diagnosis not present

## 2020-12-01 DIAGNOSIS — U071 COVID-19: Secondary | ICD-10-CM

## 2020-12-01 MED ORDER — AZITHROMYCIN 250 MG PO TABS: ORAL_TABLET | ORAL | 0 refills | Status: DC

## 2020-12-01 MED ORDER — ALBUTEROL SULFATE HFA 108 (90 BASE) MCG/ACT IN AERS: 2.0000 | INHALATION_SPRAY | RESPIRATORY_TRACT | 0 refills | Status: DC | PRN

## 2020-12-01 NOTE — Progress Notes (Signed)
Patient ID: James Rivas, male    DOB: Apr 12, 1971, 50 y.o.   MRN: [id number]   Chief Complaint  Patient presents with  . ER follow up on Covid    Still with some cough- No SOB Patient received infusion in ER   Subjective:    HPI  Cc- f/u for covid 19 and coughing.  Pt dx recently with covid 10 on 11/19/20. Pt had CT abdomen for rt sided abd pain on 11/19/20 and was also dx with covid and later told he has covid pneumonia due to left lower lobe with air space opacities.  Pt tested positive on 11/19/20 for covid. Pt received plasma monoclonal ab infusion in ER. Then return to ER on 11/25/20 for lightheadedness and dehydration. This resolved.  Now just having a residual cough that has worsened.  Not able to sleep due to the coughing.  occ is productive.  Has tried mucinex, and not helping. No sob, fever, sore throat, ear pain.  Sputum is yellow- white.   Medical History James Rivas has a past medical history of Allergy, Chest pain, HTN (hypertension), Impaired fasting glucose, Morbid obesity (HCC), Sleep apnea, and Venous stasis.   Outpatient Encounter Medications as of 12/01/2020  Medication Sig  . albuterol (VENTOLIN HFA) 108 (90 Base) MCG/ACT inhaler Inhale 2 puffs into the lungs every 4 (four) hours as needed for wheezing or shortness of breath.  Marland Kitchen azithromycin (ZITHROMAX Z-PAK) 250 MG tablet Take 2 tab p.o. day 1, then take 1 tab daily for 4 more days.  . benzonatate (TESSALON) 100 MG capsule Take 1 capsule (100 mg total) by mouth 2 (two) times daily as needed for cough. (Patient not taking: Reported on 11/19/2020)  . chlorthalidone (HYGROTON) 25 MG tablet TAKE 1 TABLET(25 MG) BY MOUTH DAILY (Patient taking differently: Take 25 mg by mouth daily. TAKE 1 TABLET(25 MG) BY MOUTH DAILY)  . cyclobenzaprine (FLEXERIL) 10 MG tablet Take 1 tablet (10 mg total) by mouth at bedtime.  . diclofenac (VOLTAREN) 75 MG EC tablet TAKE 1 TABLET(75 MG) BY MOUTH TWICE DAILY WITH FOOD (Patient taking  differently: Take 75 mg by mouth daily as needed for mild pain or moderate pain.)  . enalapril (VASOTEC) 20 MG tablet TAKE 1 TABLET(20 MG) BY MOUTH DAILY (Patient taking differently: Take 20 mg by mouth daily. TAKE 1 TABLET(20 MG) BY MOUTH DAILY)  . metoprolol tartrate (LOPRESSOR) 25 MG tablet Take 0.5 tablets (12.5 mg total) by mouth 2 (two) times daily. TAKE 1/2 TABLET(12.5 MG) BY MOUTH TWICE DAILY  . Phenylephrine-APAP-guaiFENesin (MUCINEX FAST-MAX) 10-650-400 MG/20ML LIQD Take 20 mLs by mouth daily as needed (for cold/flu).  Marland Kitchen tiZANidine (ZANAFLEX) 4 MG tablet Take 1 tablet (4 mg total) by mouth every 6 (six) hours as needed for muscle spasms.  . [DISCONTINUED] amoxicillin-clavulanate (AUGMENTIN) 875-125 MG tablet Take 1 tablet by mouth 2 (two) times daily. (Patient not taking: Reported on 11/19/2020)   No facility-administered encounter medications on file as of 12/01/2020.     Review of Systems  Constitutional: Negative for chills and fever.  HENT: Negative for congestion, ear discharge, ear pain, postnasal drip, rhinorrhea, sinus pressure, sinus pain and sore throat.   Respiratory: Positive for cough. Negative for chest tightness, shortness of breath and wheezing.   Cardiovascular: Negative for chest pain and leg swelling.  Gastrointestinal: Negative for abdominal pain, diarrhea, nausea and vomiting.  Genitourinary: Negative for dysuria and frequency.  Skin: Negative for rash.  Neurological: Negative for dizziness, weakness and headaches.  Vitals BP 140/90   Pulse 85   Temp 98.6 F (37 C) (Temporal)   Resp 16   SpO2 98%   Objective:   Physical Exam Vitals and nursing note reviewed.  Constitutional:      General: He is not in acute distress.    Appearance: Normal appearance. He is obese. He is not ill-appearing or toxic-appearing.  HENT:     Head: Normocephalic.     Right Ear: Tympanic membrane, ear canal and external ear normal.     Left Ear: Tympanic membrane, ear  canal and external ear normal.     Nose: Nose normal. No congestion or rhinorrhea.     Mouth/Throat:     Mouth: Mucous membranes are moist.     Pharynx: No oropharyngeal exudate or posterior oropharyngeal erythema.  Eyes:     Extraocular Movements: Extraocular movements intact.     Conjunctiva/sclera: Conjunctivae normal.     Pupils: Pupils are equal, round, and reactive to light.  Cardiovascular:     Rate and Rhythm: Normal rate and regular rhythm.     Pulses: Normal pulses.     Heart sounds: Normal heart sounds. No murmur heard.   Pulmonary:     Effort: Pulmonary effort is normal. No respiratory distress.     Breath sounds: Normal breath sounds. No wheezing, rhonchi or rales.  Musculoskeletal:        General: Normal range of motion.     Right lower leg: No edema.     Left lower leg: No edema.  Skin:    General: Skin is warm and dry.     Findings: No rash.  Neurological:     General: No focal deficit present.     Mental Status: He is alert and oriented to person, place, and time.     Cranial Nerves: No cranial nerve deficit.  Psychiatric:        Mood and Affect: Mood normal.        Behavior: Behavior normal.        Thought Content: Thought content normal.        Judgment: Judgment normal.      Assessment and Plan   1. Pneumonia due to COVID-19 virus - azithromycin (ZITHROMAX Z-PAK) 250 MG tablet; Take 2 tab p.o. day 1, then take 1 tab daily for 4 more days.  Dispense: 6 each; Refill: 0 - albuterol (VENTOLIN HFA) 108 (90 Base) MCG/ACT inhaler; Inhale 2 puffs into the lungs every 4 (four) hours as needed for wheezing or shortness of breath.  Dispense: 18 g; Refill: 0  2. History of COVID-19   Call or f/u in 2-3 days if not improving.  pt to cont to use mucinex or delsym for coughing and increase water intake.  Blood pressure elevated today.  Pt to f/u as scheduled this month for a recheck.  Pt in agreement.

## 2020-12-13 DIAGNOSIS — G4733 Obstructive sleep apnea (adult) (pediatric): Secondary | ICD-10-CM | POA: Diagnosis not present

## 2020-12-20 ENCOUNTER — Other Ambulatory Visit: Payer: Self-pay

## 2020-12-20 ENCOUNTER — Ambulatory Visit (INDEPENDENT_AMBULATORY_CARE_PROVIDER_SITE_OTHER): Payer: BC Managed Care – PPO | Admitting: Family Medicine

## 2020-12-20 ENCOUNTER — Encounter: Payer: Self-pay | Admitting: Family Medicine

## 2020-12-20 VITALS — BP 138/88 | HR 90 | Temp 97.5°F | Wt 384.4 lb

## 2020-12-20 DIAGNOSIS — I10 Essential (primary) hypertension: Secondary | ICD-10-CM

## 2020-12-20 DIAGNOSIS — Z8616 Personal history of COVID-19: Secondary | ICD-10-CM

## 2020-12-20 DIAGNOSIS — Z0000 Encounter for general adult medical examination without abnormal findings: Secondary | ICD-10-CM | POA: Diagnosis not present

## 2020-12-20 DIAGNOSIS — Z125 Encounter for screening for malignant neoplasm of prostate: Secondary | ICD-10-CM

## 2020-12-20 HISTORY — DX: Personal history of COVID-19: Z86.16

## 2020-12-20 MED ORDER — METOPROLOL TARTRATE 25 MG PO TABS: 12.5000 mg | ORAL_TABLET | Freq: Two times a day (BID) | ORAL | 1 refills | Status: DC

## 2020-12-20 NOTE — Progress Notes (Signed)
Patient ID: James Rivas, male    DOB: [birth year], 50 y.o.   MRN: [id number]   Chief Complaint  Patient presents with  . Hypertension  . vericose veins  . Follow-up    Patient reports a cough that occurs for a couple of days every other week since having covid.   Subjective:    HPI  htn-  Pt taking one of his bp meds this am and enalapril at night. metoprolol bid 12.33m bid. Chlorthalidone 232m  Wearing compression stockings, not having pain in lower legs  H/o covid illness 12/21. Feels he is recovering well from covid.   Not having issues with sob or exertion.  Pt is back working.  Pt stating varicose veins are improving, since pt wearing compression stockings.   Medical History ErJuanpabloas a past medical history of Allergy, Chest pain, HTN (hypertension), Impaired fasting glucose, Morbid obesity ([facility]hen[facility] Sleep apnea, and Venous stasis.   Outpatient Encounter Medications as of 12/20/2020  Medication Sig  . albuterol (VENTOLIN HFA) 108 (90 Base) MCG/ACT inhaler Inhale 2 puffs into the lungs every 4 (four) hours as needed for wheezing or shortness of breath.  . chlorthalidone (HYGROTON) 25 MG tablet TAKE 1 TABLET(25 MG) BY MOUTH DAILY (Patient taking differently: Take 25 mg by mouth daily. TAKE 1 TABLET(25 MG) BY MOUTH DAILY)  . enalapril (VASOTEC) 20 MG tablet TAKE 1 TABLET(20 MG) BY MOUTH DAILY (Patient taking differently: Take 20 mg by mouth daily. TAKE 1 TABLET(20 MG) BY MOUTH DAILY)  . metoprolol tartrate (LOPRESSOR) 25 MG tablet Take 0.5 tablets (12.5 mg total) by mouth 2 (two) times daily. TAKE 1/2 TABLET(12.5 MG) BY MOUTH TWICE DAILY  . tiZANidine (ZANAFLEX) 4 MG tablet Take 1 tablet (4 mg total) by mouth every 6 (six) hours as needed for muscle spasms.  . [DISCONTINUED] azithromycin (ZITHROMAX Z-PAK) 250 MG tablet Take 2 tab p.o. day 1, then take 1 tab daily for 4 more days.  . [DISCONTINUED] benzonatate (TESSALON) 100 MG capsule Take 1 capsule (100 mg total) by mouth 2  (two) times daily as needed for cough. (Patient not taking: Reported on 11/19/2020)  . [DISCONTINUED] cyclobenzaprine (FLEXERIL) 10 MG tablet Take 1 tablet (10 mg total) by mouth at bedtime.  . [DISCONTINUED] diclofenac (VOLTAREN) 75 MG EC tablet TAKE 1 TABLET(75 MG) BY MOUTH TWICE DAILY WITH FOOD (Patient taking differently: Take 75 mg by mouth daily as needed for mild pain or moderate pain.)  . [DISCONTINUED] metoprolol tartrate (LOPRESSOR) 25 MG tablet Take 0.5 tablets (12.5 mg total) by mouth 2 (two) times daily. TAKE 1/2 TABLET(12.5 MG) BY MOUTH TWICE DAILY  . [DISCONTINUED] Phenylephrine-APAP-guaiFENesin (MUCINEX FAST-MAX) 10-650-400 MG/20ML LIQD Take 20 mLs by mouth daily as needed (for cold/flu).   No facility-administered encounter medications on file as of 12/20/2020.     Review of Systems  Constitutional: Negative for chills and fever.  HENT: Negative for congestion, rhinorrhea and sore throat.   Respiratory: Negative for cough, shortness of breath and wheezing.   Cardiovascular: Negative for chest pain and leg swelling.  Gastrointestinal: Negative for abdominal pain, diarrhea, nausea and vomiting.  Genitourinary: Negative for dysuria and frequency.  Skin: Negative for rash.  Neurological: Negative for dizziness, weakness and headaches.     Vitals BP 138/88   Pulse 90   Temp (!) 97.5 F (36.4 C)   Wt (!) 384 lb 6.4 oz (174.4 kg)   SpO2 98%   BMI 45.58 kg/m   Objective:   Physical Exam Vitals and  nursing note reviewed.  Constitutional:      General: He is not in acute distress.    Appearance: Normal appearance. He is obese. He is not ill-appearing.  Cardiovascular:     Rate and Rhythm: Normal rate and regular rhythm.     Pulses: Normal pulses.     Heart sounds: Normal heart sounds. No murmur heard.   Pulmonary:     Effort: Pulmonary effort is normal. No respiratory distress.     Breath sounds: Normal breath sounds. No wheezing, rhonchi or rales.   Musculoskeletal:        General: Normal range of motion.  Skin:    General: Skin is warm and dry.     Findings: No rash.  Neurological:     General: No focal deficit present.     Mental Status: He is alert and oriented to person, place, and time.     Cranial Nerves: No cranial nerve deficit.     Motor: No weakness.     Gait: Gait normal.  Psychiatric:        Mood and Affect: Mood normal.        Behavior: Behavior normal.        Thought Content: Thought content normal.        Judgment: Judgment normal.      Assessment and Plan   1. Primary hypertension  2. History of COVID-19  3. Laboratory tests ordered as part of a complete physical exam (CPE) - CBC - CMP14+EGFR - Lipid panel - PSA; Future  4. Screening PSA (prostate specific antigen) - PSA; Future   htn-suboptimal. Pt current dose of metoprolol 12.46m bid. We will increase the metoprolol to 242mqhs, and 12.16m18mn AM. Cont with enalapril 93m816m night and chlorthalidone 216mg44mAM.  Recheck bp on next visit.  F/u 2-3 wks for cpe.

## 2020-12-20 NOTE — Patient Instructions (Signed)
Take 1 full tablet metoprolol at night and 1/2 tab in AM.   Continue all other blood pressure meds.

## 2021-01-13 DIAGNOSIS — G4733 Obstructive sleep apnea (adult) (pediatric): Secondary | ICD-10-CM | POA: Diagnosis not present

## 2021-01-18 LAB — HM DIABETES EYE EXAM

## 2021-01-25 ENCOUNTER — Telehealth: Payer: Self-pay | Admitting: Family Medicine

## 2021-01-25 DIAGNOSIS — I10 Essential (primary) hypertension: Secondary | ICD-10-CM

## 2021-01-25 DIAGNOSIS — E7800 Pure hypercholesterolemia, unspecified: Secondary | ICD-10-CM

## 2021-01-25 DIAGNOSIS — R7301 Impaired fasting glucose: Secondary | ICD-10-CM

## 2021-01-25 DIAGNOSIS — Z79899 Other long term (current) drug therapy: Secondary | ICD-10-CM

## 2021-01-25 MED ORDER — ENALAPRIL MALEATE 20 MG PO TABS: ORAL_TABLET | ORAL | 0 refills | Status: DC

## 2021-01-25 NOTE — Addendum Note (Signed)
Addended by: Marlowe Shores on: 01/25/2021 11:29 AM   Modules accepted: Orders

## 2021-01-25 NOTE — Telephone Encounter (Signed)
Pls give 30 days supply with no refil.  Make sure pt has labs ordered, cbc, cmp, lipids, and a1c. To be done 1 wk prior to appt.  Thx. Dr. Ladona Ridgel

## 2021-01-25 NOTE — Telephone Encounter (Signed)
Lab orders placed. MyChart message sent to patient.

## 2021-01-25 NOTE — Telephone Encounter (Signed)
Lab Results  Component Value Date   HGBA1C 6.3 (H) 08/06/2020    Lab Results  Component Value Date   CREATININE 1.23 11/19/2020     Lab Results  Component Value Date   CHOL 175 08/06/2020   HDL 37 (L) 08/06/2020   LDLCALC 117 (H) 08/06/2020   TRIG 118 08/06/2020   CHOLHDL 4.7 08/06/2020     BP Readings from Last 3 Encounters:  12/20/20 138/88  12/01/20 140/90  11/25/20 (!) 145/95

## 2021-01-25 NOTE — Telephone Encounter (Signed)
Pharmacy requesting refill on Enalapril 20 mg tablets. Take one tablet po daily. Pt last seen in January and has upcoming appt in March. Please advise. Thank you

## 2021-02-04 ENCOUNTER — Encounter: Payer: BC Managed Care – PPO | Admitting: Family Medicine

## 2021-02-09 ENCOUNTER — Ambulatory Visit (INDEPENDENT_AMBULATORY_CARE_PROVIDER_SITE_OTHER): Payer: BC Managed Care – PPO | Admitting: Family Medicine

## 2021-02-09 ENCOUNTER — Encounter: Payer: Self-pay | Admitting: Family Medicine

## 2021-02-09 ENCOUNTER — Other Ambulatory Visit: Payer: Self-pay

## 2021-02-09 VITALS — BP 136/86 | HR 83 | Temp 97.6°F | Ht 77.0 in | Wt 388.2 lb

## 2021-02-09 DIAGNOSIS — Z0000 Encounter for general adult medical examination without abnormal findings: Secondary | ICD-10-CM | POA: Diagnosis not present

## 2021-02-09 DIAGNOSIS — Z0001 Encounter for general adult medical examination with abnormal findings: Secondary | ICD-10-CM

## 2021-02-09 DIAGNOSIS — M25562 Pain in left knee: Secondary | ICD-10-CM | POA: Diagnosis not present

## 2021-02-09 DIAGNOSIS — Z125 Encounter for screening for malignant neoplasm of prostate: Secondary | ICD-10-CM | POA: Diagnosis not present

## 2021-02-09 DIAGNOSIS — G8929 Other chronic pain: Secondary | ICD-10-CM

## 2021-02-09 MED ORDER — MELOXICAM 15 MG PO TABS: 15.0000 mg | ORAL_TABLET | Freq: Every day | ORAL | 0 refills | Status: DC

## 2021-02-09 NOTE — Telephone Encounter (Signed)
error 

## 2021-02-09 NOTE — Progress Notes (Signed)
Patient ID: James Rivas, male    DOB: [birth year], 50 y.o.   MRN: [id number]   Chief Complaint  Patient presents with  . Annual Exam   Subjective:    HPI  The patient comes in today for a wellness visit.  A review of their health history was completed.  A review of medications was also completed.  Any needed refills; no  Eating habits: trying to eat good  Falls/  MVA accidents in past few months: none  Regular exercise: not really- but trying to  Specialist pt sees on regular basis: heart specialist  Preventative health issues were discussed.   Additional concerns: left knee pain  Left knee has arthritis. Had it for years.  Last few weeks worse.  Taking ibuprofen for pain prn. Not liking to take it much.  Seeing cards 1x per year for h/o chest pain.   Medical History Mamie has a past medical history of Allergy, Chest pain, HTN (hypertension), Impaired fasting glucose, Morbid obesity ([facility]), Sleep apnea, and Venous stasis.   Outpatient Encounter Medications as of 02/09/2021  Medication Sig  . colchicine 0.6 MG tablet Take 2 tab p.o. on day 1 of flare, then take 1 tab daily till flare resolves.  . [DISCONTINUED] meloxicam (MOBIC) 15 MG tablet Take 1 tablet (15 mg total) by mouth daily.  Marland Kitchen albuterol (VENTOLIN HFA) 108 (90 Base) MCG/ACT inhaler Inhale 2 puffs into the lungs every 4 (four) hours as needed for wheezing or shortness of breath.  . chlorthalidone (HYGROTON) 25 MG tablet TAKE 1 TABLET(25 MG) BY MOUTH DAILY (Patient taking differently: Take 25 mg by mouth daily. TAKE 1 TABLET(25 MG) BY MOUTH DAILY)  . enalapril (VASOTEC) 20 MG tablet TAKE 1 TABLET(20 MG) BY MOUTH DAILY  . metoprolol tartrate (LOPRESSOR) 25 MG tablet Take 0.5 tablets (12.5 mg total) by mouth 2 (two) times daily. TAKE 1/2 TABLET(12.5 MG) BY MOUTH TWICE DAILY  . tiZANidine (ZANAFLEX) 4 MG tablet Take 1 tablet (4 mg total) by mouth every 6 (six) hours as needed for muscle spasms.   No  facility-administered encounter medications on file as of 02/09/2021.     Review of Systems  Constitutional: Negative for chills and fever.  HENT: Negative for congestion, rhinorrhea and sore throat.   Respiratory: Negative for cough, shortness of breath and wheezing.   Cardiovascular: Negative for chest pain and leg swelling.  Gastrointestinal: Negative for abdominal pain, diarrhea, nausea and vomiting.  Genitourinary: Negative for dysuria and frequency.  Musculoskeletal: Positive for arthralgias (left knee pain, swelling) and joint swelling.  Skin: Negative for rash.  Neurological: Negative for dizziness, weakness and headaches.     Vitals BP 136/86   Pulse 83   Temp 97.6 F (36.4 C) (Oral)   Ht 6' 5"  (1.956 m)   Wt (!) 388 lb 3.2 oz (176.1 kg)   SpO2 98%   BMI 46.03 kg/m   Objective:   Physical Exam Vitals and nursing note reviewed.  Constitutional:      General: He is not in acute distress.    Appearance: Normal appearance. He is obese. He is not ill-appearing.  HENT:     Head: Normocephalic.     Nose: Nose normal. No congestion.     Mouth/Throat:     Mouth: Mucous membranes are moist.     Pharynx: No oropharyngeal exudate.  Eyes:     Extraocular Movements: Extraocular movements intact.     Conjunctiva/sclera: Conjunctivae normal.     Pupils: Pupils are  equal, round, and reactive to light.  Cardiovascular:     Rate and Rhythm: Normal rate and regular rhythm.     Pulses: Normal pulses.     Heart sounds: Normal heart sounds. No murmur heard.   Pulmonary:     Effort: Pulmonary effort is normal.     Breath sounds: Normal breath sounds. No wheezing, rhonchi or rales.  Musculoskeletal:        General: Swelling and tenderness present.     Right lower leg: No edema.     Left lower leg: No edema.     Comments: Left knee-decreased range of motion, erythema, warmth, swelling over the left knee.  Tender to palpation diffusely over the knee.  Skin:    General: Skin  is warm and dry.     Findings: No rash.  Neurological:     General: No focal deficit present.     Mental Status: He is alert and oriented to person, place, and time.     Cranial Nerves: No cranial nerve deficit.  Psychiatric:        Mood and Affect: Mood normal.        Behavior: Behavior normal.        Thought Content: Thought content normal.        Judgment: Judgment normal.      Assessment and Plan   1. Encounter for well adult exam with abnormal findings  2. Chronic pain of left knee - Ambulatory referral to Orthopedic Surgery - Uric acid - Sed Rate (ESR) - C-reactive protein  3. Laboratory tests ordered as part of a complete physical exam (CPE) - PSA  4. Screening PSA (prostate specific antigen) - PSA   Hypertension-suboptimal- Hasn't had 1/2 tab metoprolol today. Normally taking bid.  Patient to go home and take medications.  Patient to continue with chlorthalidone, enalapril and metoprolol for blood pressure.  Decrease salt in the diet and increase exercise as tolerated.  Pt gets refills after labs.  Pt to get labs today.   Return in about 6 months (around 08/12/2021) for f/u htn.   Addendum-on labs, uric acid was elevated, patient was diagnosed with gout.  Sent in colchicine for knee pain, positive uric acid on labs.

## 2021-02-10 DIAGNOSIS — G4733 Obstructive sleep apnea (adult) (pediatric): Secondary | ICD-10-CM | POA: Diagnosis not present

## 2021-02-11 DIAGNOSIS — E7800 Pure hypercholesterolemia, unspecified: Secondary | ICD-10-CM | POA: Diagnosis not present

## 2021-02-11 DIAGNOSIS — G8929 Other chronic pain: Secondary | ICD-10-CM | POA: Diagnosis not present

## 2021-02-11 DIAGNOSIS — I10 Essential (primary) hypertension: Secondary | ICD-10-CM | POA: Diagnosis not present

## 2021-02-11 DIAGNOSIS — Z125 Encounter for screening for malignant neoplasm of prostate: Secondary | ICD-10-CM | POA: Diagnosis not present

## 2021-02-11 DIAGNOSIS — Z79899 Other long term (current) drug therapy: Secondary | ICD-10-CM | POA: Diagnosis not present

## 2021-02-11 DIAGNOSIS — R7301 Impaired fasting glucose: Secondary | ICD-10-CM | POA: Diagnosis not present

## 2021-02-11 DIAGNOSIS — M25562 Pain in left knee: Secondary | ICD-10-CM | POA: Diagnosis not present

## 2021-02-12 LAB — CBC WITH DIFFERENTIAL/PLATELET
Basophils Absolute: 0 10*3/uL (ref 0.0–0.2)
Basos: 1 %
EOS (ABSOLUTE): 0.1 10*3/uL (ref 0.0–0.4)
Eos: 2 %
Hematocrit: 45.9 % (ref 37.5–51.0)
Hemoglobin: 15.6 g/dL (ref 13.0–17.7)
Immature Grans (Abs): 0 10*3/uL (ref 0.0–0.1)
Immature Granulocytes: 0 %
Lymphocytes Absolute: 2.2 10*3/uL (ref 0.7–3.1)
Lymphs: 34 %
MCH: 27.9 pg (ref 26.6–33.0)
MCHC: 34 g/dL (ref 31.5–35.7)
MCV: 82 fL (ref 79–97)
Monocytes Absolute: 0.5 10*3/uL (ref 0.1–0.9)
Monocytes: 8 %
Neutrophils Absolute: 3.7 10*3/uL (ref 1.4–7.0)
Neutrophils: 55 %
Platelets: 206 10*3/uL (ref 150–450)
RBC: 5.59 x10E6/uL (ref 4.14–5.80)
RDW: 14.5 % (ref 11.6–15.4)
WBC: 6.5 10*3/uL (ref 3.4–10.8)

## 2021-02-12 LAB — COMPREHENSIVE METABOLIC PANEL
ALT: 26 IU/L (ref 0–44)
AST: 26 IU/L (ref 0–40)
Albumin/Globulin Ratio: 1.4 (ref 1.2–2.2)
Albumin: 4.2 g/dL (ref 3.8–4.9)
Alkaline Phosphatase: 56 IU/L (ref 44–121)
BUN/Creatinine Ratio: 20 (ref 9–20)
BUN: 20 mg/dL (ref 6–24)
Bilirubin Total: 0.4 mg/dL (ref 0.0–1.2)
CO2: 20 mmol/L (ref 20–29)
Calcium: 9.4 mg/dL (ref 8.7–10.2)
Chloride: 101 mmol/L (ref 96–106)
Creatinine, Ser: 1 mg/dL (ref 0.76–1.27)
Globulin, Total: 3.1 g/dL (ref 1.5–4.5)
Glucose: 94 mg/dL (ref 65–99)
Potassium: 4.3 mmol/L (ref 3.5–5.2)
Sodium: 139 mmol/L (ref 134–144)
Total Protein: 7.3 g/dL (ref 6.0–8.5)
eGFR: 91 mL/min/{1.73_m2} (ref >59)

## 2021-02-12 LAB — LIPID PANEL
Chol/HDL Ratio: 4.8 ratio (ref 0.0–5.0)
Cholesterol, Total: 181 mg/dL (ref 100–199)
HDL: 38 mg/dL — ABNORMAL LOW (ref >39)
LDL Chol Calc (NIH): 125 mg/dL — ABNORMAL HIGH (ref 0–99)
Triglycerides: 97 mg/dL (ref 0–149)
VLDL Cholesterol Cal: 18 mg/dL (ref 5–40)

## 2021-02-12 LAB — C-REACTIVE PROTEIN: CRP: 2 mg/L (ref 0–10)

## 2021-02-12 LAB — HEMOGLOBIN A1C
Est. average glucose Bld gHb Est-mCnc: 137 mg/dL
Hgb A1c MFr Bld: 6.4 % — ABNORMAL HIGH (ref 4.8–5.6)

## 2021-02-12 LAB — SEDIMENTATION RATE: Sed Rate: 40 mm/hr — ABNORMAL HIGH (ref 0–30)

## 2021-02-12 LAB — PSA: Prostate Specific Ag, Serum: 0.2 ng/mL (ref 0.0–4.0)

## 2021-02-12 LAB — URIC ACID: Uric Acid: 9 mg/dL — ABNORMAL HIGH (ref 3.8–8.4)

## 2021-02-14 MED ORDER — COLCHICINE 0.6 MG PO TABS: ORAL_TABLET | ORAL | 0 refills | Status: DC

## 2021-02-22 ENCOUNTER — Ambulatory Visit: Payer: BC Managed Care – PPO

## 2021-02-22 ENCOUNTER — Ambulatory Visit (INDEPENDENT_AMBULATORY_CARE_PROVIDER_SITE_OTHER): Payer: BC Managed Care – PPO | Admitting: Orthopaedic Surgery

## 2021-02-22 ENCOUNTER — Other Ambulatory Visit: Payer: Self-pay

## 2021-02-22 ENCOUNTER — Encounter: Payer: Self-pay | Admitting: Orthopaedic Surgery

## 2021-02-22 VITALS — BP 159/87 | HR 83 | Ht 77.0 in | Wt 383.0 lb

## 2021-02-22 DIAGNOSIS — G8929 Other chronic pain: Secondary | ICD-10-CM | POA: Diagnosis not present

## 2021-02-22 DIAGNOSIS — M25562 Pain in left knee: Secondary | ICD-10-CM

## 2021-02-22 DIAGNOSIS — M10062 Idiopathic gout, left knee: Secondary | ICD-10-CM

## 2021-02-22 DIAGNOSIS — Z6841 Body Mass Index (BMI) 40.0 and over, adult: Secondary | ICD-10-CM

## 2021-02-22 MED ORDER — NAPROXEN 500 MG PO TABS: 500.0000 mg | ORAL_TABLET | Freq: Two times a day (BID) | ORAL | 5 refills | Status: DC

## 2021-02-22 MED ORDER — HYDROCODONE-ACETAMINOPHEN 5-325 MG PO TABS: ORAL_TABLET | ORAL | 0 refills | Status: DC

## 2021-02-22 MED ORDER — ALLOPURINOL 300 MG PO TABS: 300.0000 mg | ORAL_TABLET | Freq: Every day | ORAL | 5 refills | Status: DC

## 2021-02-22 NOTE — Progress Notes (Signed)
Subjective:    Patient ID: James Rivas, male    DOB: [birth year], 50 y.o.   MRN: [id number]  HPI He has pain of the left knee which began several weeks ago.  He has no trauma.  He has been seen by [facility] on 02-09-21.  I have reviewed the notes.  He has swelling, popping but no giving way of the left knee.  He has medial pain.  He has no redness, no weakness.  It is not getting better. He has been on Mobic with no help.  He has used ice and rubs with no help.  He had serum uric acid done and it is 9.  He was given colchicine.   Review of Systems  Constitutional: Positive for activity change.  Musculoskeletal: Positive for arthralgias, gait problem and joint swelling.  All other systems reviewed and are negative.  For Review of Systems, all other systems reviewed and are negative.  The following is a summary of the past history medically, past history surgically, known current medicines, social history and family history.  This information is gathered electronically by the computer from prior information and documentation.  I review this each visit and have found including this information at this point in the chart is beneficial and informative.   Past Medical History:  Diagnosis Date  . Allergy   . Chest pain   . HTN (hypertension)   . Impaired fasting glucose   . Morbid obesity (HCC)   . Sleep apnea    uses a CPAP  . Venous stasis     Past Surgical History:  Procedure Laterality Date  . COLONOSCOPY WITH PROPOFOL N/A 05/11/2020   Procedure: COLONOSCOPY WITH PROPOFOL;  Surgeon: Corbin Ade, MD;  Location: [facility];  Service: Endoscopy;  Laterality: N/A;  2:15pm  . FINGER SURGERY Left    left ring finger 2 surgeries  . I & D EXTREMITY  11/29/2011   Procedure: IRRIGATION AND DEBRIDEMENT EXTREMITY;  Surgeon: Sharma Covert;  Location: [facility] OR;  Service: Orthopedics;  Laterality: Left;  I&D Right Long and Index Fingers with Tendon Repair as Needed    Current  Outpatient Medications on File Prior to Visit  Medication Sig Dispense Refill  . albuterol (VENTOLIN HFA) 108 (90 Base) MCG/ACT inhaler Inhale 2 puffs into the lungs every 4 (four) hours as needed for wheezing or shortness of breath. 18 g 0  . chlorthalidone (HYGROTON) 25 MG tablet TAKE 1 TABLET(25 MG) BY MOUTH DAILY (Patient taking differently: Take 25 mg by mouth daily. TAKE 1 TABLET(25 MG) BY MOUTH DAILY) 90 tablet 1  . colchicine 0.6 MG tablet Take 2 tab p.o. on day 1 of flare, then take 1 tab daily till flare resolves. 30 tablet 0  . enalapril (VASOTEC) 20 MG tablet TAKE 1 TABLET(20 MG) BY MOUTH DAILY 30 tablet 0  . metoprolol tartrate (LOPRESSOR) 25 MG tablet Take 0.5 tablets (12.5 mg total) by mouth 2 (two) times daily. TAKE 1/2 TABLET(12.5 MG) BY MOUTH TWICE DAILY 90 tablet 1  . tiZANidine (ZANAFLEX) 4 MG tablet Take 1 tablet (4 mg total) by mouth every 6 (six) hours as needed for muscle spasms. 30 tablet 0   No current facility-administered medications on file prior to visit.    Social History   Socioeconomic History  . Marital status: Married    Spouse name: Not on file  . Number of children: Not on file  . Years of education: Not on file  .  Highest education level: Not on file  Occupational History  . Not on file  Tobacco Use  . Smoking status: Never Smoker  . Smokeless tobacco: Never Used  Vaping Use  . Vaping Use: Never used  Substance and Sexual Activity  . Alcohol use: Not Currently    Comment: occ. 1 beer or a glass of wine a couple times a week.   . Drug use: No  . Sexual activity: Never  Other Topics Concern  . Not on file  Social History Narrative  . Not on file   Social Determinants of Health   Financial Resource Strain: Not on file  Food Insecurity: Not on file  Transportation Needs: Not on file  Physical Activity: Not on file  Stress: Not on file  Social Connections: Not on file  Intimate Partner Violence: Not on file    Family History  Problem  Relation Age of Onset  . Diabetes Father   . Diabetes Paternal Aunt   . Colon cancer Neg Hx     BP (!) 159/87   Pulse 83   Ht 6\' 5"  (1.956 m)   Wt (!) 383 lb (173.7 kg)   BMI 45.42 kg/m   Body mass index is 45.42 kg/m.  The patient meets the AMA guidelines for Morbid (severe) obesity with a BMI > 40.0 and I have recommended weight loss.       Objective:   Physical Exam Vitals and nursing note reviewed. Exam conducted with a chaperone present.  Constitutional:      Appearance: He is well-developed.  HENT:     Head: Normocephalic and atraumatic.  Eyes:     Conjunctiva/sclera: Conjunctivae normal.     Pupils: Pupils are equal, round, and reactive to light.  Cardiovascular:     Rate and Rhythm: Normal rate and regular rhythm.  Pulmonary:     Effort: Pulmonary effort is normal.  Abdominal:     Palpations: Abdomen is soft.  Musculoskeletal:     Cervical back: Normal range of motion and neck supple.       Legs:  Skin:    General: Skin is warm and dry.  Neurological:     Mental Status: He is alert and oriented to person, place, and time.     Cranial Nerves: No cranial nerve deficit.     Motor: No abnormal muscle tone.     Coordination: Coordination normal.     Deep Tendon Reflexes: Reflexes are normal and symmetric. Reflexes normal.  Psychiatric:        Behavior: Behavior normal.        Thought Content: Thought content normal.        Judgment: Judgment normal.    X-rays were done of the left knee, reported separately.       Assessment & Plan:   Encounter Diagnoses  Name Primary?  . Chronic pain of left knee Yes  . Acute idiopathic gout of left knee   . Body mass index 45.0-49.9, adult (HCC)   . Morbid obesity (HCC)    I have told him I am concerned about possible medial meniscus tear.  He may need MRI.  PROCEDURE NOTE:  The patient requests injections of the left knee , verbal consent was obtained.  The left knee was prepped appropriately after time  out was performed.   Sterile technique was observed and injection of 1 cc of Celestone 6 mg with several cc's of plain xylocaine. Anesthesia was provided by ethyl chloride and a 20-gauge needle  was used to inject the knee area. The injection was tolerated well.  A band aid dressing was applied.  The patient was advised to apply ice later today and tomorrow to the injection sight as needed.  I have explained gout.  I will begin allopurinol.  I will have him stop the Mobic and begin Naprosyn 500.  I will give pain medicine.    I have reviewed the [place] Controlled Substance Reporting System web site prior to prescribing narcotic medicine for this patient.   Return in two weeks.  He may need MRI then.  Call if any problem.  Precautions discussed.   Electronically Signed Darreld Mclean, MD 3/29/20224:35 PM

## 2021-02-22 NOTE — Patient Instructions (Signed)
Stop mobic, meloxicam.

## 2021-02-25 ENCOUNTER — Encounter: Payer: Self-pay | Admitting: Family Medicine

## 2021-03-08 ENCOUNTER — Encounter: Payer: Self-pay | Admitting: Orthopaedic Surgery

## 2021-03-08 ENCOUNTER — Ambulatory Visit: Payer: BC Managed Care – PPO | Admitting: Orthopaedic Surgery

## 2021-03-08 ENCOUNTER — Other Ambulatory Visit: Payer: Self-pay

## 2021-03-08 VITALS — BP 126/77 | HR 82 | Ht 77.0 in | Wt 382.0 lb

## 2021-03-08 DIAGNOSIS — G8929 Other chronic pain: Secondary | ICD-10-CM | POA: Diagnosis not present

## 2021-03-08 DIAGNOSIS — Z6841 Body Mass Index (BMI) 40.0 and over, adult: Secondary | ICD-10-CM | POA: Diagnosis not present

## 2021-03-08 DIAGNOSIS — M10062 Idiopathic gout, left knee: Secondary | ICD-10-CM

## 2021-03-08 NOTE — Progress Notes (Signed)
Patient ZO:XWRU James Rivas, male DOB:[birth year], 50 y.o. EAV:[id number]  Chief Complaint  Patient presents with  . Knee Pain    Left knee pain patient reports some better     HPI  James Rivas is a 50 y.o. male who has gout and pain of the left knee.  He is taking his allopurinol.  He appears to understand the need to take it daily.  He does feel better.  He has no new trauma.  His left knee is less painful.  He has popping and some swelling but not the pain he had before. He is active.   Body mass index is 45.3 kg/m.  The patient meets the AMA guidelines for Morbid (severe) obesity with a BMI > 40.0 and I have recommended weight loss.   ROS  Review of Systems  Constitutional: Positive for activity change.  Musculoskeletal: Positive for arthralgias, gait problem and joint swelling.  All other systems reviewed and are negative.   All other systems reviewed and are negative.  The following is a summary of the past history medically, past history surgically, known current medicines, social history and family history.  This information is gathered electronically by the computer from prior information and documentation.  I review this each visit and have found including this information at this point in the chart is beneficial and informative.    Past Medical History:  Diagnosis Date  . Allergy   . Chest pain   . HTN (hypertension)   . Impaired fasting glucose   . Morbid obesity (HCC)   . Sleep apnea    uses a CPAP  . Venous stasis     Past Surgical History:  Procedure Laterality Date  . COLONOSCOPY WITH PROPOFOL N/A 05/11/2020   Procedure: COLONOSCOPY WITH PROPOFOL;  Surgeon: Corbin Ade, MD;  [facility]: [facility];  Service: Endoscopy;  Laterality: N/A;  2:15pm  . FINGER SURGERY Left    left ring finger 2 surgeries  . I & D EXTREMITY  11/29/2011   Procedure: IRRIGATION AND DEBRIDEMENT EXTREMITY;  Surgeon: Sharma Covert;  Location: [facility];  Service: Orthopedics;  Laterality:  Left;  I&D Right Long and Index Fingers with Tendon Repair as Needed    Family History  Problem Relation Age of Onset  . Diabetes Father   . Diabetes Paternal Aunt   . Colon cancer Neg Hx     Social History Social History   Tobacco Use  . Smoking status: Never Smoker  . Smokeless tobacco: Never Used  Vaping Use  . Vaping Use: Never used  Substance Use Topics  . Alcohol use: Not Currently    Comment: occ. 1 beer or a glass of wine a couple times a week.   . Drug use: No    No Known Allergies  Current Outpatient Medications  Medication Sig Dispense Refill  . albuterol (VENTOLIN HFA) 108 (90 Base) MCG/ACT inhaler Inhale 2 puffs into the lungs every 4 (four) hours as needed for wheezing or shortness of breath. 18 g 0  . allopurinol (ZYLOPRIM) 300 MG tablet Take 1 tablet (300 mg total) by mouth daily. 30 tablet 5  . chlorthalidone (HYGROTON) 25 MG tablet TAKE 1 TABLET(25 MG) BY MOUTH DAILY (Patient taking differently: Take 25 mg by mouth daily. TAKE 1 TABLET(25 MG) BY MOUTH DAILY) 90 tablet 1  . colchicine 0.6 MG tablet Take 2 tab p.o. on day 1 of flare, then take 1 tab daily till flare resolves. 30 tablet 0  .  enalapril (VASOTEC) 20 MG tablet TAKE 1 TABLET(20 MG) BY MOUTH DAILY 30 tablet 0  . HYDROcodone-acetaminophen (NORCO/VICODIN) 5-325 MG tablet One tablet every four hours for pain. 30 tablet 0  . metoprolol tartrate (LOPRESSOR) 25 MG tablet Take 0.5 tablets (12.5 mg total) by mouth 2 (two) times daily. TAKE 1/2 TABLET(12.5 MG) BY MOUTH TWICE DAILY 90 tablet 1  . naproxen (NAPROSYN) 500 MG tablet Take 1 tablet (500 mg total) by mouth 2 (two) times daily with a meal. 60 tablet 5  . tiZANidine (ZANAFLEX) 4 MG tablet Take 1 tablet (4 mg total) by mouth every 6 (six) hours as needed for muscle spasms. 30 tablet 0   No current facility-administered medications for this visit.     Physical Exam  Blood pressure 126/77, pulse 82, height 6\' 5"  (1.956 m), weight (!) 382 lb (173.3  kg).  Constitutional: overall normal hygiene, normal nutrition, well developed, normal grooming, normal body habitus. Assistive device:none  Musculoskeletal: gait and station Limp left, muscle tone and strength are normal, no tremors or atrophy is present.  .  Neurological: coordination overall normal.  Deep tendon reflex/nerve stretch intact.  Sensation normal.  Cranial nerves II-XII intact.   Skin:   Normal overall no scars, lesions, ulcers or rashes. No psoriasis.  Psychiatric: Alert and oriented x 3.  Recent memory intact, remote memory unclear.  Normal mood and affect. Well groomed.  Good eye contact.  Cardiovascular: overall no swelling, no varicosities, no edema bilaterally, normal temperatures of the legs and arms, no clubbing, cyanosis and good capillary refill.  Lymphatic: palpation is normal.  Left knee has some pain but less than before, an effusion but less than before, ROM 0 to 110, slight limp left, stable, NV intact.  All other systems reviewed and are negative   The patient has been educated about the nature of the problem(s) and counseled on treatment options.  The patient appeared to understand what I have discussed and is in agreement with it.  Encounter Diagnoses  Name Primary?  . Chronic pain of left knee Yes  . Acute idiopathic gout of left knee   . Body mass index 45.0-49.9, adult (HCC)   . Morbid obesity (HCC)     PLAN Call if any problems.  Precautions discussed.  Continue current medications.   Return to clinic 1 month   Electronically Signed 04-21-1984, MD 4/12/20223:53 PM

## 2021-03-13 DIAGNOSIS — G4733 Obstructive sleep apnea (adult) (pediatric): Secondary | ICD-10-CM | POA: Diagnosis not present

## 2021-03-24 ENCOUNTER — Telehealth: Payer: Self-pay | Admitting: Orthopaedic Surgery

## 2021-03-24 NOTE — Telephone Encounter (Signed)
ll from patient, question regarding knee pain - he is asking for an appointment today, if possible, with Dr Hilda Lias; and if not, with one of our other doctors. States he had to go home from work due to the pain. Relayed to patient Dr Hilda Lias has completed clinic for the day, and we have no other availability with our other providers this week. Urgent care or emergency room? Also, ?move up his May appointment to earlier date?

## 2021-03-24 NOTE — Telephone Encounter (Signed)
Called patient earlier this afternoon; offered; states he will let us know by tomorrow if needs to come on Tuesday, Dr Sanjuan Dame next scheduled day.

## 2021-04-04 NOTE — Progress Notes (Signed)
CARDIOLOGY CONSULT NOTE       Patient ID: James Rivas MRN: [id number] DOB/AGE: 04/06/1971 50 y.o.  Admit date: (Not on file) Referring Physician: Ladona Ridgel Primary Physician: Annalee Genta, DO Primary Cardiologist: New previously Purvis Sheffield Reason for Consultation: PVC abnormal ECG    HPI:  50 y.o. previously seen by Dr Kirtland Bouchard for palpitations. Monitor with PVCls 4 beat NSVT Rx with beta blocker with improvement Normal echo September 2019 reviewed. History of HTN Rx with beta blocker , ACE And diuretic. Morbid obesity Most recent ECG 11/25/20 with SR poor R wave progression nonspecific inferior ST changes. 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 Allergy   . Chest pain   . HTN (hypertension)   . Impaired fasting glucose   . Morbid obesity (HCC)   . Sleep apnea    uses a CPAP  . Venous stasis     Family History  Problem Relation Age of Onset  . Diabetes Father   . Diabetes Paternal Aunt   . Colon cancer Neg Hx     Social History   Socioeconomic History  . Marital status: Married    Spouse name: Not on file  . Number of children: Not on file  . Years of education: Not on file  . Highest education level: Not on file  Occupational History  . Not on file  Tobacco Use  . Smoking status: Never Smoker  . Smokeless tobacco: Never Used  Vaping Use  . Vaping Use: Never used  Substance and Sexual Activity  . Alcohol use: Not Currently    Comment: occ. 1 beer or a glass of wine a couple times a  week.   . Drug use: No  . Sexual activity: Never  Other Topics Concern  . Not on file  Social History Narrative  . Not on file   Social Determinants of Health   Financial Resource Strain: Not on file  Food Insecurity: Not on file  Transportation Needs: Not on file  Physical Activity: Not on file  Stress: Not on file  Social Connections: Not on file  Intimate Partner Violence: Not on file    Past Surgical History:  Procedure Laterality Date  . COLONOSCOPY WITH PROPOFOL N/A 05/11/2020   Procedure: COLONOSCOPY WITH PROPOFOL;  Surgeon: Corbin Ade, MD;  [facility];  Service: Endoscopy;  Laterality: N/A;  2:15pm  . FINGER SURGERY Left    left ring finger 2 surgeries  . I & D EXTREMITY  11/29/2011   Procedure: IRRIGATION AND DEBRIDEMENT EXTREMITY;  Surgeon: Sharma Covert;  Location: [facility];  Service: Orthopedics;  Laterality: Left;  I&D Right Long and Index Fingers with Tendon Repair as Needed      Current Outpatient Medications:  .  albuterol (VENTOLIN HFA) 108 (90 Base) MCG/ACT inhaler, Inhale 2 puffs into the lungs every 4 (four) hours as needed for wheezing or shortness of breath., Disp: 18 g, Rfl: 0 .  allopurinol (ZYLOPRIM) 300 MG tablet, Take 1 tablet (300 mg total) by mouth daily., Disp: 30 tablet, Rfl: 5 .  chlorthalidone (HYGROTON) 25 MG tablet, TAKE 1 TABLET(25 MG) BY MOUTH DAILY (Patient taking differently: Take 25 mg by mouth daily. TAKE 1 TABLET(25 MG) BY MOUTH DAILY), Disp: 90 tablet, Rfl: 1 .  colchicine 0.6 MG tablet, Take 2 tab p.o. on day 1 of flare, then take 1 tab daily till flare resolves., Disp: 30 tablet, Rfl: 0 .  enalapril (VASOTEC) 20 MG tablet, TAKE 1 TABLET(20 MG) BY MOUTH DAILY, Disp: 30 tablet, Rfl: 0 .  HYDROcodone-acetaminophen (NORCO/VICODIN) 5-325 MG tablet, One tablet every four hours for pain., Disp: 30 tablet, Rfl: 0 .  metoprolol tartrate (LOPRESSOR) 25 MG tablet, Take 0.5 tablets (12.5 mg total) by mouth 2 (two) times daily. TAKE 1/2  TABLET(12.5 MG) BY MOUTH TWICE DAILY, Disp: 90 tablet, Rfl: 1 .  naproxen (NAPROSYN) 500 MG tablet, Take 1 tablet (500 mg total) by mouth 2 (two) times daily with a meal., Disp: 60 tablet, Rfl: 5 .  tiZANidine (ZANAFLEX) 4 MG tablet, Take 1 tablet (4 mg total) by mouth every 6 (six) hours as needed for muscle spasms., Disp: 30 tablet, Rfl: 0    Physical Exam: There were no vitals taken for this visit.    Affect appropriate Overweight black male  HEENT: normal Neck supple with no adenopathy JVP normal no bruits no thyromegaly Lungs clear with no wheezing and good diaphragmatic motion Heart:  S1/S2 no murmur, no rub, gallop or click PMI normal Abdomen: benighn, BS positve, no tenderness, no AAA no bruit.  No HSM or HJR Distal pulses intact with no bruits Plus one bilateral edema Neuro non-focal Skin warm and dry No muscular weakness   Labs:   Lab Results  Component Value Date   WBC 6.5 02/11/2021   HGB 15.6 02/11/2021   HCT 45.9 02/11/2021   MCV 82 02/11/2021   PLT 206 02/11/2021   No results for input(s): NA, K, CL, CO2, BUN, CREATININE, CALCIUM, PROT, BILITOT, ALKPHOS, ALT, AST, GLUCOSE in the last 168 hours.  Invalid input(s): LABALBU Lab Results  Component Value Date   TROPONINI <0.03 06/14/2018    Lab Results  Component Value Date   CHOL 181 02/11/2021   CHOL 175 08/06/2020   CHOL 165 01/05/2020   Lab Results  Component Value Date   HDL 38 (L) 02/11/2021   HDL 37 (L) 08/06/2020   HDL 42 01/05/2020   Lab Results  Component Value Date   LDLCALC 125 (H) 02/11/2021   LDLCALC 117 (H) 08/06/2020   LDLCALC 108 (H) 01/05/2020   Lab Results  Component Value Date   TRIG 97 02/11/2021   TRIG 118 08/06/2020   TRIG 79 01/05/2020   Lab Results  Component Value Date   CHOLHDL 4.8 02/11/2021   CHOLHDL 4.7 08/06/2020   CHOLHDL 3.9 01/05/2020   No results found for: LDLDIRECT    Radiology: No results found.  EKG: see HPI   ASSESSMENT AND PLAN:   1.  PVC:  Benign appearing Rx with beta blocker previous echo with no structural heart disease. Discussed screening coronary calcium score to risk stratify  2. HTN:  Well controlled.  Continue current medications and low sodium Dash type diet.   3. Obesity: discussed diet, exercise possible bariatric surgery   4. Gout :  Continue allopurinol and PRN colchicine for flare   Calcium Score F/U in a year   Signed: Charlton Haws 04/04/2021, 12:31  PM   

## 2021-04-05 ENCOUNTER — Ambulatory Visit: Payer: BC Managed Care – PPO | Admitting: Orthopaedic Surgery

## 2021-04-05 ENCOUNTER — Encounter: Payer: Self-pay | Admitting: Orthopaedic Surgery

## 2021-04-05 ENCOUNTER — Other Ambulatory Visit: Payer: Self-pay

## 2021-04-05 VITALS — BP 138/86 | HR 89 | Ht 77.0 in | Wt 386.0 lb

## 2021-04-05 DIAGNOSIS — Z6841 Body Mass Index (BMI) 40.0 and over, adult: Secondary | ICD-10-CM | POA: Diagnosis not present

## 2021-04-05 DIAGNOSIS — M10062 Idiopathic gout, left knee: Secondary | ICD-10-CM

## 2021-04-05 DIAGNOSIS — G8929 Other chronic pain: Secondary | ICD-10-CM

## 2021-04-05 NOTE — Addendum Note (Signed)
Addended by: Arvilla Market on: 04/05/2021 03:37 PM   Modules accepted: Orders

## 2021-04-05 NOTE — Patient Instructions (Signed)
Address: [address] [place], [place] [address] Hours:  Open ? Closes 8PM Health & safety: Appointment required  Staff required to disinfect surfaces between visits  More details Phone: [phone]

## 2021-04-05 NOTE — Progress Notes (Signed)
Patient James Rivas, male DOB:Feb 06, 1971, 50 y.o. KTG:[id number]  Chief Complaint  Patient presents with  . Knee Pain    Left     HPI  James Rivas is a 50 y.o. male who has continued pain and swelling of the left knee.  It is not getting any better.  He has giving way.  He has done the exercises given him with no help.  He has no redness,  He has no new trauma.  He takes his allopurinol.  I am concerned about meniscus tear.  I will get MRI of the knee as he is not improving.   Body mass index is 45.77 kg/m. The patient meets the AMA guidelines for Morbid (severe) obesity with a BMI > 40.0 and I have recommended weight loss.  ROS  Review of Systems  Constitutional: Positive for activity change.  Musculoskeletal: Positive for arthralgias, gait problem and joint swelling.  All other systems reviewed and are negative.   All other systems reviewed and are negative.  The following is a summary of the past history medically, past history surgically, known current medicines, social history and family history.  This information is gathered electronically by the computer from prior information and documentation.  I review this each visit and have found including this information at this point in the chart is beneficial and informative.    Past Medical History:  Diagnosis Date  . Allergy   . Chest pain   . HTN (hypertension)   . Impaired fasting glucose   . Morbid obesity (HCC)   . Sleep apnea    uses a CPAP  . Venous stasis     Past Surgical History:  Procedure Laterality Date  . COLONOSCOPY WITH PROPOFOL N/A 05/11/2020   Procedure: COLONOSCOPY WITH PROPOFOL;  Surgeon: Corbin Ade, MD;  [facility];  Service: Endoscopy;  Laterality: N/A;  2:15pm  . FINGER SURGERY Left    left ring finger 2 surgeries  . I & D EXTREMITY  11/29/2011   Procedure: IRRIGATION AND DEBRIDEMENT EXTREMITY;  Surgeon: Sharma Covert;  Location: [facility];  Service: Orthopedics;  Laterality:  Left;  I&D Right Long and Index Fingers with Tendon Repair as Needed    Family History  Problem Relation Age of Onset  . Diabetes Father   . Diabetes Paternal Aunt   . Colon cancer Neg Hx     Social History Social History   Tobacco Use  . Smoking status: Never Smoker  . Smokeless tobacco: Never Used  Vaping Use  . Vaping Use: Never used  Substance Use Topics  . Alcohol use: Not Currently    Comment: occ. 1 beer or a glass of wine a couple times a week.   . Drug use: No    No Known Allergies  Current Outpatient Medications  Medication Sig Dispense Refill  . albuterol (VENTOLIN HFA) 108 (90 Base) MCG/ACT inhaler Inhale 2 puffs into the lungs every 4 (four) hours as needed for wheezing or shortness of breath. 18 g 0  . allopurinol (ZYLOPRIM) 300 MG tablet Take 1 tablet (300 mg total) by mouth daily. 30 tablet 5  . chlorthalidone (HYGROTON) 25 MG tablet TAKE 1 TABLET(25 MG) BY MOUTH DAILY (Patient taking differently: Take 25 mg by mouth daily. TAKE 1 TABLET(25 MG) BY MOUTH DAILY) 90 tablet 1  . enalapril (VASOTEC) 20 MG tablet TAKE 1 TABLET(20 MG) BY MOUTH DAILY 30 tablet 0  . HYDROcodone-acetaminophen (NORCO/VICODIN) 5-325 MG tablet One tablet every  four hours for pain. 30 tablet 0  . metoprolol tartrate (LOPRESSOR) 25 MG tablet Take 0.5 tablets (12.5 mg total) by mouth 2 (two) times daily. TAKE 1/2 TABLET(12.5 MG) BY MOUTH TWICE DAILY 90 tablet 1  . naproxen (NAPROSYN) 500 MG tablet Take 1 tablet (500 mg total) by mouth 2 (two) times daily with a meal. 60 tablet 5  . tiZANidine (ZANAFLEX) 4 MG tablet Take 1 tablet (4 mg total) by mouth every 6 (six) hours as needed for muscle spasms. 30 tablet 0  . colchicine 0.6 MG tablet Take 2 tab p.o. on day 1 of flare, then take 1 tab daily till flare resolves. (Patient not taking: Reported on 04/05/2021) 30 tablet 0   No current facility-administered medications for this visit.     Physical Exam  Blood pressure 138/86, pulse 89, height  6\' 5"  (1.956 m), weight (!) 386 lb (175.1 kg).  Constitutional: overall normal hygiene, normal nutrition, well developed, normal grooming, normal body habitus. Assistive device:none  Musculoskeletal: gait and station Limp left, muscle tone and strength are normal, no tremors or atrophy is present.  .  Neurological: coordination overall normal.  Deep tendon reflex/nerve stretch intact.  Sensation normal.  Cranial nerves II-XII intact.   Skin:   Normal overall no scars, lesions, ulcers or rashes. No psoriasis.  Psychiatric: Alert and oriented x 3.  Recent memory intact, remote memory unclear.  Normal mood and affect. Well groomed.  Good eye contact.  Cardiovascular: overall no swelling, some knee varicosities, no edema bilaterally, normal temperatures of the legs and arms, no clubbing, cyanosis and good capillary refill.  Left knee has large effusion, crepitus, ROM 0 to 105 with pain, positive medial McMurray, limp left, NV intact. He has some slight varicosities around the knee.  Lymphatic: palpation is normal.  All other systems reviewed and are negative   The patient has been educated about the nature of the problem(s) and counseled on treatment options.  The patient appeared to understand what I have discussed and is in agreement with it.  Encounter Diagnoses  Name Primary?  . Chronic pain of left knee Yes  . Acute idiopathic gout of left knee   . Body mass index 45.0-49.9, adult (HCC)   . Morbid obesity (HCC)     PLAN Call if any problems.  Precautions discussed.  Continue current medications.   Return to clinic 3 weeks   Get MRI of the knee. Continue exercises as tolerated.  Electronically Signed 04-21-1984, MD 5/10/20223:30 PM

## 2021-04-06 ENCOUNTER — Other Ambulatory Visit: Payer: Self-pay | Admitting: Family Medicine

## 2021-04-06 NOTE — Telephone Encounter (Signed)
error 

## 2021-04-12 ENCOUNTER — Encounter: Payer: Self-pay | Admitting: Cardiovascular Disease

## 2021-04-12 ENCOUNTER — Other Ambulatory Visit: Payer: Self-pay

## 2021-04-12 ENCOUNTER — Ambulatory Visit (INDEPENDENT_AMBULATORY_CARE_PROVIDER_SITE_OTHER): Payer: BC Managed Care – PPO | Admitting: Cardiovascular Disease

## 2021-04-12 VITALS — BP 126/84 | HR 68 | Ht 77.0 in | Wt 385.0 lb

## 2021-04-12 DIAGNOSIS — I10 Essential (primary) hypertension: Secondary | ICD-10-CM | POA: Diagnosis not present

## 2021-04-12 DIAGNOSIS — I493 Ventricular premature depolarization: Secondary | ICD-10-CM | POA: Diagnosis not present

## 2021-04-12 DIAGNOSIS — G4733 Obstructive sleep apnea (adult) (pediatric): Secondary | ICD-10-CM | POA: Diagnosis not present

## 2021-04-12 DIAGNOSIS — Z136 Encounter for screening for cardiovascular disorders: Secondary | ICD-10-CM

## 2021-04-12 DIAGNOSIS — M10069 Idiopathic gout, unspecified knee: Secondary | ICD-10-CM

## 2021-04-12 NOTE — Patient Instructions (Addendum)
Medication Instructions:  Continue all current medications.  Labwork: none  Testing/Procedures:  Coronary Calcium Scoring  Office will contact with results via phone or letter.    Follow-Up: Your physician wants you to follow up in:  1 year.  You will receive a reminder letter in the mail one-two months in advance.  If you don't receive a letter, please call our office to schedule the follow up appointment.    Any Other Special Instructions Will Be Listed Below (If Applicable).  If you need a refill on your cardiac medications before your next appointment, please call your pharmacy.

## 2021-04-13 ENCOUNTER — Ambulatory Visit (HOSPITAL_COMMUNITY): Payer: BC Managed Care – PPO

## 2021-04-21 DIAGNOSIS — I8392 Asymptomatic varicose veins of left lower extremity: Secondary | ICD-10-CM | POA: Diagnosis not present

## 2021-04-21 DIAGNOSIS — S83412A Sprain of medial collateral ligament of left knee, initial encounter: Secondary | ICD-10-CM | POA: Diagnosis not present

## 2021-04-21 DIAGNOSIS — S83232A Complex tear of medial meniscus, current injury, left knee, initial encounter: Secondary | ICD-10-CM | POA: Diagnosis not present

## 2021-04-21 DIAGNOSIS — M1712 Unilateral primary osteoarthritis, left knee: Secondary | ICD-10-CM | POA: Diagnosis not present

## 2021-04-26 ENCOUNTER — Encounter: Payer: Self-pay | Admitting: Orthopaedic Surgery

## 2021-04-26 ENCOUNTER — Ambulatory Visit: Payer: BC Managed Care – PPO | Admitting: Orthopaedic Surgery

## 2021-04-26 ENCOUNTER — Other Ambulatory Visit: Payer: Self-pay

## 2021-04-26 VITALS — BP 135/85 | HR 91 | Ht 77.0 in | Wt 386.6 lb

## 2021-04-26 DIAGNOSIS — S83242D Other tear of medial meniscus, current injury, left knee, subsequent encounter: Secondary | ICD-10-CM

## 2021-04-26 NOTE — Progress Notes (Signed)
My knee still hurts.  He had MRI at [facility] and it showed tear of the left knee medial meniscus posterior with horizontal and radially oriented tears.  He had partial MCL tear also.He has tricompartmental degenerative changes as well.  I have informed him of his findings.  I have recommended arthroscopy of the knee.  I will have him see Dr. [name] or Dr. Dallas Schimke.  Call if any problem.  Precautions discussed.   Electronically Signed Darreld Mclean, MD 5/31/20224:07 PM

## 2021-05-02 ENCOUNTER — Ambulatory Visit: Payer: BC Managed Care – PPO | Admitting: Internal Medicine

## 2021-05-04 ENCOUNTER — Encounter: Payer: Self-pay | Admitting: Orthopedic Surgery

## 2021-05-04 ENCOUNTER — Ambulatory Visit: Payer: BC Managed Care – PPO | Admitting: Orthopedic Surgery

## 2021-05-04 ENCOUNTER — Telehealth: Payer: Self-pay | Admitting: Orthopedic Surgery

## 2021-05-04 ENCOUNTER — Other Ambulatory Visit: Payer: Self-pay

## 2021-05-04 VITALS — BP 158/93 | HR 75 | Ht 77.0 in | Wt 385.0 lb

## 2021-05-04 DIAGNOSIS — M1710 Unilateral primary osteoarthritis, unspecified knee: Secondary | ICD-10-CM

## 2021-05-04 DIAGNOSIS — M1711 Unilateral primary osteoarthritis, right knee: Secondary | ICD-10-CM | POA: Diagnosis not present

## 2021-05-04 DIAGNOSIS — M23321 Other meniscus derangements, posterior horn of medial meniscus, right knee: Secondary | ICD-10-CM

## 2021-05-04 NOTE — Telephone Encounter (Signed)
Patient can not do the date for surgery as discussed. Needs to R/S surgery   Please call the patient back

## 2021-05-04 NOTE — Progress Notes (Signed)
MRI Knee Left WO IV Contrast  Anatomical Region Laterality Modality  Thigh -- Magnetic Resonance  Knee -- --  Leg -- --    Impression Performed by [id number] IMPRESSION:   Medial meniscal complex tear with extrusion.   MCL partial tear.   Tricompartmental knee osteoarthritis greatest involving medial compartment.   Venous varicosities.   Joint effusion.    Electronically Signed by: Lourena Simmonds on 04/22/2021 10:53 AM  Narrative Performed by [id number] INDICATION: Pain in left knee  COMPARISON: None   TECHNIQUE: Multiplanar, multisequence MR imaging of the left knee was performed without contrast.   FINDINGS:  Bones: No evidence of fracture, bone lesion, or osteonecrosis.  Cartilage: Mild partial-thickness patellar cartilage loss grade involving the lateral patellar facet and median patellar ridge. Cartilage fissure medial patellar ridge. High-grade cartilage loss involving the weightbearing surface and posterior aspect of the medial compartment with likely areas of full-thickness cartilage loss. Mild subchondral edema. Partial-thickness cartilage loss lateral femoral condyle posteriorly.  Soft tissues: No mass or fluid collection. Multiple venous varicosities.  Joints: No subluxation. Prominent joint effusion.   Medial meniscus: Mild tearing involving the free edge of the posterior horn. Attenuated appearance of the posterior body junction and body of the medial meniscus with horizontal and radially oriented tear components. Medial meniscal extrusion. Small parameniscal cyst posteriorly.  Lateral meniscus: Intact.  Anterior cruciate ligament: Intact.  Posterior cruciate ligament: Intact.  Lateral collateral ligamentous structures (including fibular collateral ligament, biceps femoris tendon, popliteus tendon, and posterolateral corner structures): Intact.  Medial collateral ligament: Partial deficiency involving some of the posterior fibrous MCL proximally. Mass effect on the MCL  secondary the medial meniscus.  Semimembranosus tendon and pes anserinus bursa: Intact  Extensor mechanism, including quadriceps tendon, medial and lateral patellar retinaculum, and patellar tendon: Intact.   Additional comments: None  Procedure Note  Ploof, Madelaine Etienne, MD - 04/22/2021  Formatting of this note might be different from the original.  INDICATION: Pain in left knee  COMPARISON: None   TECHNIQUE: Multiplanar, multisequence MR imaging of the left knee was performed without contrast.   FINDINGS:  Bones: No evidence of fracture, bone lesion, or osteonecrosis.  Cartilage: Mild partial-thickness patellar cartilage loss grade involving the lateral patellar facet and median patellar ridge. Cartilage fissure medial patellar ridge. High-grade cartilage loss involving the weightbearing surface and posterior aspect of the medial compartment with likely areas of full-thickness cartilage loss. Mild subchondral edema. Partial-thickness cartilage loss lateral femoral condyle posteriorly.  Soft tissues: No mass or fluid collection. Multiple venous varicosities.  Joints: No subluxation. Prominent joint effusion.   Medial meniscus: Mild tearing involving the free edge of the posterior horn. Attenuated appearance of the posterior body junction and body ofthe medial meniscus with horizontal and radially oriented tear components. Medial meniscal extrusion. Small parameniscal cyst posteriorly.  Lateral meniscus: Intact.  Anterior cruciate ligament: Intact.  Posterior cruciate ligament: Intact.  Lateral collateral ligamentous structures (including fibular collateral ligament, biceps femoris tendon, popliteus tendon, and posterolateral corner structures): Intact.  Medial collateral ligament: Partial deficiency involving some of the posterior fibrous MCL proximally. Mass effect on the MCL secondary the medial meniscus.  Semimembranosus tendon and pes anserinus bursa: Intact  Extensor mechanism, including  quadriceps tendon, medial and lateral patellar retinaculum, and patellar tendon: Intact.   Additional comments: None    IMPRESSION:   Medial meniscal complex tear with extrusion.   MCL partial tear.   Tricompartmental knee osteoarthritis greatest involving medial compartment.   Venous varicosities.   Joint effusion.  Electronically Signed by: Lourena Simmonds on 04/22/2021 10:53 AM  Resulting Agency Comment  [id number] Exam End: 04/21/21 4:29 PM

## 2021-05-04 NOTE — Progress Notes (Signed)
Chief Complaint  Patient presents with  . Knee Pain    Left    James Rivas is a 50 year old male with hypertension venous stasis disease morbid obesity presents with atraumatic onset of pain in his left knee which was worked up by Dr. Hilda Lias.  He was found that he had arthritis in all 3 compartments with more disease medially and he had a medial meniscal tear.  He denies any trauma.  He did not respond well to nonoperative treatment  Past Medical History:  Diagnosis Date  . Allergy   . Chest pain   . HTN (hypertension)   . Impaired fasting glucose   . Morbid obesity (HCC)   . Sleep apnea    uses a CPAP  . Venous stasis    Past Surgical History:  Procedure Laterality Date  . COLONOSCOPY WITH PROPOFOL N/A 05/11/2020   Procedure: COLONOSCOPY WITH PROPOFOL;  Surgeon: Corbin Ade, MD;  Location: [facility];  Service: Endoscopy;  Laterality: N/A;  2:15pm  . FINGER SURGERY Left    left ring finger 2 surgeries  . I & D EXTREMITY  11/29/2011   Procedure: IRRIGATION AND DEBRIDEMENT EXTREMITY;  Surgeon: Sharma Covert;  Location: [facility] OR;  Service: Orthopedics;  Laterality: Left;  I&D Right Long and Index Fingers with Tendon Repair as Needed   Family History  Problem Relation Age of Onset  . Diabetes Father   . Diabetes Paternal Aunt   . Colon cancer Neg Hx    Social History   Tobacco Use  . Smoking status: Never Smoker  . Smokeless tobacco: Never Used  Vaping Use  . Vaping Use: Never used  Substance Use Topics  . Alcohol use: Not Currently    Comment: occ. 1 beer or a glass of wine a couple times a week.   . Drug use: No   BP (!) 158/93   Pulse 75   Ht 6\' 5"  (1.956 m)   Wt (!) 385 lb (174.6 kg)   BMI 45.65 kg/m   The patient is morbidly obese otherwise well groomed no deformities  Mental status he is awake and alert and oriented x3 Psychiatric mood and affect are normal  Cardiovascular he has distal swelling and skin discoloration on both lower extremities with multiple  varicose veins extremities are otherwise warm to touch and there are no ulcerations  Musculoskeletal  Left knee tenderness medial joint line no lateral condylar tenderness mild medial condylar tenderness mild crepitance on range of motion he can fully extend the knee flexes it to about 120 degrees the knee feels stable  The MRI was on the disc  My interpretation of that disc 3 compartment degenerative changes with medial compartment disease torn medial meniscus ACL PCL intact  The procedure has been fully reviewed with the patient; The risks and benefits of surgery have been discussed and explained and understood. Alternative treatment has also been reviewed, questions were encouraged and answered. The postoperative plan is also been reviewed.  No diagnosis found.  Recommend arthroscopy of the left knee.  The patient will need aspirin for 6 weeks after surgery to prophylactically treat potential blood clot.  I advised him about postop swelling may be an issue  I also advised him that he may be out of work 4 to 6 weeks and we also discussed that he will have arthritic pain after the surgery

## 2021-05-04 NOTE — Telephone Encounter (Signed)
Ok I called him changed to 05/17/21 thanks

## 2021-05-10 NOTE — Patient Instructions (Addendum)
James Rivas  05/10/2021     @PREFPERIOPPHARMACY @   Your procedure is scheduled on 05/17/2021.   Report to 05/19/2021 at  0805  A.M.   Call this number if you have problems the morning of surgery:  [phone]   Remember:  Do not eat or drink after midnight.      Take these medicines the morning of surgery with A SIP OF WATER     allopurinol, hydrocodone (if needed0, metoprolol, zanaflex (if needed).  Use your inhaler before you come and bring your rescue inhaler with you.     Please brush your teeth.  Do not wear jewelry, make-up or nail polish.  Do not wear lotions, powders, or perfumes, or deodorant.  Do not shave 48 hours prior to surgery.  Men may shave face and neck.  Do not bring valuables to the hospital.  [facility] is not responsible for any belongings or valuables.  Contacts, dentures or bridgework may not be worn into surgery.  Leave your suitcase in the car.  After surgery it may be brought to your room.  For patients admitted to the hospital, discharge time will be determined by your treatment team.  Patients discharged the day of surgery will not be allowed to drive home and must have someone with them for 24 hours.    Special instructions:   DO NOT smoke tobacco or vape fore 24 hours before your procedure.  Please read over the following fact sheets that you were given. Coughing and Deep Breathing, Surgical Site Infection Prevention, Anesthesia Post-op Instructions, and Care and Recovery After Surgery      Arthroscopic Knee Ligament Repair, Care After This sheet gives you information about how to care for yourself after your procedure. Your health care provider may also give you more specific instructions. If you have problems or questions, contact your health careprovider. What can I expect after the procedure? After the procedure, it is common to have: Soreness or pain in your knee. Bruising and swelling on your knee, calf, and ankle  for 3-4 days. A small amount of fluid coming from the incisions. Follow these instructions at home: Medicines Take over-the-counter and prescription medicines only as told by your health care provider. Ask your health care provider if the medicine prescribed to you: Requires you to avoid driving or using machinery. Can cause constipation. You may need to take these actions to prevent or treat constipation: Drink enough fluid to keep your urine pale yellow. Take over-the-counter or prescription medicines. Eat foods that are high in fiber, such as beans, whole grains, and fresh fruits and vegetables. Limit foods that are high in fat and processed sugars, such as fried or sweet foods. If you have a brace or immobilizer: Wear it as told by your health care provider. Remove it only as told by your health care provider. Loosen it if your toes tingle, become numb, or turn cold and blue. Keep it clean and dry. Ask your health care provider when it is safe to drive. Bathing Do not take baths, swim, or use a hot tub until your health care provider approves. Keep your bandage (dressing) dry until your health care provider says that it can be removed. If the brace or immobilizer is not waterproof: Do not let it get wet. Cover it with a watertight covering when you take a bath or shower. Incision care  Follow instructions from your health care provider about how to  take care of your incisions. Make sure you: Wash your hands with soap and water for at least 20 seconds before and after you change your dressing. If soap and water are not available, use hand sanitizer. Change your dressing as told by your health care provider. Leave stitches (sutures), skin glue, or adhesive strips in place. These skin closures may need to stay in place for 2 weeks or longer. If adhesive strip edges start to loosen and curl up, you may trim the loose edges. Do not remove adhesive strips completely unless your health care  provider tells you to do that. Check your incision areas every day for signs of infection. Check for: Redness. More swelling or pain. Blood or more fluid. Warmth. Pus or a bad smell.  Managing pain, stiffness, and swelling  If directed, put ice on the affected area. To do this: If you have a removable brace or immobilizer, remove it as told by your health care provider. Put ice in a plastic bag. Place a towel between your skin and the bag. Leave the ice on for 20 minutes, 2-3 times a day. Remove the ice if your skin turns bright red. This is very important. If you cannot feel pain, heat, or cold, you have a greater risk of damage to the area. Move your toes often to reduce stiffness and swelling. Raise (elevate) the injured area above the level of your heart while you are sitting or lying down.  Activity Do not use your knee to support your body weight until your health care provider says that you can. Use crutches or other devices as told by your health care provider. Do physical therapy exercises as told by your health care provider. Physical therapy will help you regain movement and strength in your knee. Follow instructions from your health care provider about: When you may start motion exercises. When you may start riding a stationary bike and doing other low-impact activities. When you may start to jog and do other high-impact activities. Do not lift anything that is heavier than 10 lb (4.5 kg), or the limit that you are told, until your health care provider says that it is safe. Ask your health care provider what activities are safe for you. General instructions Do not use any products that contain nicotine or tobacco, such as cigarettes, e-cigarettes, and chewing tobacco. These can delay healing. If you need help quitting, ask your health care provider. Wear compression stockings as told by your health care provider. These stockings help to prevent blood clots and reduce  swelling in your legs. Keep all follow-up visits. This is important. Contact a health care provider if: You have any of these signs of infection: Redness around an incision. Blood or more fluid coming from an incision. Warmth coming from an incision. Pus or a bad smell coming from an incision. More swelling or pain in your knee. A fever or chills. You have pain that does not get better with medicine. Your incision opens up. Get help right away if: You have trouble breathing. You have chest pain. You have increased pain or swelling in your calf or at the back of your knee. You have numbness and tingling near the knee joint or in the foot, ankle, or toes. You notice that your foot or toes look darker than normal or are cooler than normal. These symptoms may represent a serious problem that is an emergency. Do not wait to see if the symptoms will go away. Get medical help right  away. Call your local emergency services (911 in the [place] Do not drive yourself to the hospital. Summary After the procedure, it is common to have knee pain with bruising and swelling on your knee, calf, and ankle. Icing your knee and raising your leg above the level of your heart will help control the pain and swelling. Do physical therapy exercises as told by your health care provider. Physical therapy will help you regain movement and strength in your knee. This information is not intended to replace advice given to you by your health care provider. Make sure you discuss any questions you have with your healthcare provider. Document Revised: 04/12/2020 Document Reviewed: 04/12/2020 Elsevier Patient Education  2022 Else[employer] Inc. General Anesthesia, Adult, Care After This sheet gives you information about how to care for yourself after your procedure. Your health care provider may also give you more specific instructions. If you have problems or questions, contact your health careprovider. What can I expect after  the procedure? After the procedure, the following side effects are common: Pain or discomfort at the IV site. Nausea. Vomiting. Sore throat. Trouble concentrating. Feeling cold or chills. Feeling weak or tired. Sleepiness and fatigue. Soreness and body aches. These side effects can affect parts of the body that were not involved in surgery. Follow these instructions at home: For the time period you were told by your health care provider:  Rest. Do not participate in activities where you could fall or become injured. Do not drive or use machinery. Do not drink alcohol. Do not take sleeping pills or medicines that cause drowsiness. Do not make important decisions or sign legal documents. Do not take care of children on your own.  Eating and drinking Follow any instructions from your health care provider about eating or drinking restrictions. When you feel hungry, start by eating small amounts of foods that are soft and easy to digest (bland), such as toast. Gradually return to your regular diet. Drink enough fluid to keep your urine pale yellow. If you vomit, rehydrate by drinking water, juice, or clear broth. General instructions If you have sleep apnea, surgery and certain medicines can increase your risk for breathing problems. Follow instructions from your health care provider about wearing your sleep device: Anytime you are sleeping, including during daytime naps. While taking prescription pain medicines, sleeping medicines, or medicines that make you drowsy. Have a responsible adult stay with you for the time you are told. It is important to have someone help care for you until you are awake and alert. Return to your normal activities as told by your health care provider. Ask your health care provider what activities are safe for you. Take over-the-counter and prescription medicines only as told by your health care provider. If you smoke, do not smoke without supervision. Keep  all follow-up visits as told by your health care provider. This is important. Contact a health care provider if: You have nausea or vomiting that does not get better with medicine. You cannot eat or drink without vomiting. You have pain that does not get better with medicine. You are unable to pass urine. You develop a skin rash. You have a fever. You have redness around your IV site that gets worse. Get help right away if: You have difficulty breathing. You have chest pain. You have blood in your urine or stool, or you vomit blood. Summary After the procedure, it is common to have a sore throat or nausea. It is also common to feel  tired. Have a responsible adult stay with you for the time you are told. It is important to have someone help care for you until you are awake and alert. When you feel hungry, start by eating small amounts of foods that are soft and easy to digest (bland), such as toast. Gradually return to your regular diet. Drink enough fluid to keep your urine pale yellow. Return to your normal activities as told by your health care provider. Ask your health care provider what activities are safe for you. This information is not intended to replace advice given to you by your health care provider. Make sure you discuss any questions you have with your healthcare provider. Document Revised: 07/29/2020 Document Reviewed: 02/26/2020 Elsevier Patient Education  2022 Elsevier Inc. How to Use Chlorhexidine for Bathing Chlorhexidine gluconate (CHG) is a germ-killing (antiseptic) solution that is used to clean the skin. It can get rid of the bacteria that normally live on the skin and can keep them away for about 24 hours. To clean your skin with CHG, you may be given: A CHG solution to use in the shower or as part of a sponge bath. A prepackaged cloth that contains CHG. Cleaning your skin with CHG may help lower the risk for infection: While you are staying in the intensive care  unit of the hospital. If you have a vascular access, such as a central line, to provide short-term or long-term access to your veins. If you have a catheter to drain urine from your bladder. If you are on a ventilator. A ventilator is a machine that helps you breathe by moving air in and out of your lungs. After surgery. What are the risks? Risks of using CHG include: A skin reaction. Hearing loss, if CHG gets in your ears. Eye injury, if CHG gets in your eyes and is not rinsed out. The CHG product catching fire. Make sure that you avoid smoking and flames after applying CHG to your skin. Do not use CHG: If you have a chlorhexidine allergy or have previously reacted to chlorhexidine. On babies younger than 24 months of age. How to use CHG solution Use CHG only as told by your health care provider, and follow the instructions on the label. Use the full amount of CHG as directed. Usually, this is one bottle. During a shower Follow these steps when using CHG solution during a shower (unless your health care provider gives you different instructions): Start the shower. Use your normal soap and shampoo to wash your face and hair. Turn off the shower or move out of the shower stream. Pour the CHG onto a clean washcloth. Do not use any type of brush or rough-edged sponge. Starting at your neck, lather your body down to your toes. Make sure you follow these instructions: If you will be having surgery, pay special attention to the part of your body where you will be having surgery. Scrub this area for at least 1 minute. Do not use CHG on your head or face. If the solution gets into your ears or eyes, rinse them well with water. Avoid your genital area. Avoid any areas of skin that have broken skin, cuts, or scrapes. Scrub your back and under your arms. Make sure to wash skin folds. Let the lather sit on your skin for 1-2 minutes or as long as told by your health care provider. Thoroughly rinse  your entire body in the shower. Make sure that all body creases and crevices are rinsed  well. Dry off with a clean towel. Do not put any substances on your body afterward--such as powder, lotion, or perfume--unless you are told to do so by your health care provider. Only use lotions that are recommended by the manufacturer. Put on clean clothes or pajamas. If it is the night before your surgery, sleep in clean sheets.  During a sponge bath Follow these steps when using CHG solution during a sponge bath (unless your health care provider gives you different instructions): Use your normal soap and shampoo to wash your face and hair. Pour the CHG onto a clean washcloth. Starting at your neck, lather your body down to your toes. Make sure you follow these instructions: If you will be having surgery, pay special attention to the part of your body where you will be having surgery. Scrub this area for at least 1 minute. Do not use CHG on your head or face. If the solution gets into your ears or eyes, rinse them well with water. Avoid your genital area. Avoid any areas of skin that have broken skin, cuts, or scrapes. Scrub your back and under your arms. Make sure to wash skin folds. Let the lather sit on your skin for 1-2 minutes or as long as told by your health care provider. Using a different clean, wet washcloth, thoroughly rinse your entire body. Make sure that all body creases and crevices are rinsed well. Dry off with a clean towel. Do not put any substances on your body afterward--such as powder, lotion, or perfume--unless you are told to do so by your health care provider. Only use lotions that are recommended by the manufacturer. Put on clean clothes or pajamas. If it is the night before your surgery, sleep in clean sheets. How to use CHG prepackaged cloths Only use CHG cloths as told by your health care provider, and follow the instructions on the label. Use the CHG cloth on clean, dry  skin. Do not use the CHG cloth on your head or face unless your health care provider tells you to. When washing with the CHG cloth: Avoid your genital area. Avoid any areas of skin that have broken skin, cuts, or scrapes. Before surgery Follow these steps when using a CHG cloth to clean before surgery (unless your health care provider gives you different instructions): Using the CHG cloth, vigorously scrub the part of your body where you will be having surgery. Scrub using a back-and-forth motion for 3 minutes. The area on your body should be completely wet with CHG when you are done scrubbing. Do not rinse. Discard the cloth and let the area air-dry. Do not put any substances on the area afterward, such as powder, lotion, or perfume. Put on clean clothes or pajamas. If it is the night before your surgery, sleep in clean sheets.  For general bathing Follow these steps when using CHG cloths for general bathing (unless your health care provider gives you different instructions). Use a separate CHG cloth for each area of your body. Make sure you wash between any folds of skin and between your fingers and toes. Wash your body in the following order, switching to a new cloth after each step: The front of your neck, shoulders, and chest. Both of your arms, under your arms, and your hands. Your stomach and groin area, avoiding the genitals. Your right leg and foot. Your left leg and foot. The back of your neck, your back, and your buttocks. Do not rinse. Discard the cloth and  let the area air-dry. Do not put any substances on your body afterward--such as powder, lotion, or perfume--unless you are told to do so by your health care provider. Only use lotions that are recommended by the manufacturer. Put on clean clothes or pajamas. Contact a health care provider if: Your skin gets irritated after scrubbing. You have questions about using your solution or cloth. Get help right away if: Your eyes  become very red or swollen. Your eyes itch badly. Your skin itches badly and is red or swollen. Your hearing changes. You have trouble seeing. You have swelling or tingling in your mouth or throat. You have trouble breathing. You swallow any chlorhexidine. Summary Chlorhexidine gluconate (CHG) is a germ-killing (antiseptic) solution that is used to clean the skin. Cleaning your skin with CHG may help to lower your risk for infection. You may be given CHG to use for bathing. It may be in a bottle or in a prepackaged cloth to use on your skin. Carefully follow your health care provider's instructions and the instructions on the product label. Do not use CHG if you have a chlorhexidine allergy. Contact your health care provider if your skin gets irritated after scrubbing. This information is not intended to replace advice given to you by your health care provider. Make sure you discuss any questions you have with your healthcare provider. Document Revised: 03/26/2020 Document Reviewed: 04/30/2020 Elsevier Patient Education  2022 [employer].

## 2021-05-12 ENCOUNTER — Other Ambulatory Visit: Payer: Self-pay

## 2021-05-12 ENCOUNTER — Encounter (HOSPITAL_COMMUNITY)
Admission: RE | Admit: 2021-05-12 | Discharge: 2021-05-12 | Disposition: A | Discharge status: Home / Self Care | Payer: BC Managed Care – PPO | Source: Ambulatory Visit | Attending: Orthopedic Surgery | Admitting: Orthopedic Surgery

## 2021-05-12 ENCOUNTER — Encounter (HOSPITAL_COMMUNITY): Payer: Self-pay

## 2021-05-12 DIAGNOSIS — Z01812 Encounter for preprocedural laboratory examination: Secondary | ICD-10-CM | POA: Insufficient documentation

## 2021-05-12 HISTORY — DX: Unspecified osteoarthritis, unspecified site: M19.90

## 2021-05-12 HISTORY — DX: Gout, unspecified: M10.9

## 2021-05-12 LAB — BASIC METABOLIC PANEL
Anion gap: 6 (ref 5–15)
BUN: 21 mg/dL — ABNORMAL HIGH (ref 6–20)
CO2: 28 mmol/L (ref 22–32)
Calcium: 8.6 mg/dL — ABNORMAL LOW (ref 8.9–10.3)
Chloride: 101 mmol/L (ref 98–111)
Creatinine, Ser: 0.97 mg/dL (ref 0.61–1.24)
GFR, Estimated: >60 mL/min (ref >60)
Glucose, Bld: 102 mg/dL — ABNORMAL HIGH (ref 70–99)
Potassium: 3.9 mmol/L (ref 3.5–5.1)
Sodium: 135 mmol/L (ref 135–145)

## 2021-05-12 LAB — CBC WITH DIFFERENTIAL/PLATELET
Abs Immature Granulocytes: 0.02 10*3/uL (ref 0.00–0.07)
Basophils Absolute: 0 10*3/uL (ref 0.0–0.1)
Basophils Relative: 0 %
Eosinophils Absolute: 0.3 10*3/uL (ref 0.0–0.5)
Eosinophils Relative: 3 %
HCT: 46.7 % (ref 39.0–52.0)
Hemoglobin: 14.9 g/dL (ref 13.0–17.0)
Immature Granulocytes: 0 %
Lymphocytes Relative: 30 %
Lymphs Abs: 2.2 10*3/uL (ref 0.7–4.0)
MCH: 28.2 pg (ref 26.0–34.0)
MCHC: 31.9 g/dL (ref 30.0–36.0)
MCV: 88.3 fL (ref 80.0–100.0)
Monocytes Absolute: 0.5 10*3/uL (ref 0.1–1.0)
Monocytes Relative: 7 %
Neutro Abs: 4.3 10*3/uL (ref 1.7–7.7)
Neutrophils Relative %: 60 %
Platelets: 205 10*3/uL (ref 150–400)
RBC: 5.29 MIL/uL (ref 4.22–5.81)
RDW: 14.6 % (ref 11.5–15.5)
WBC: 7.3 10*3/uL (ref 4.0–10.5)
nRBC: 0 % (ref 0.0–0.2)

## 2021-05-13 ENCOUNTER — Telehealth: Payer: Self-pay | Admitting: Radiology

## 2021-05-13 DIAGNOSIS — G4733 Obstructive sleep apnea (adult) (pediatric): Secondary | ICD-10-CM | POA: Diagnosis not present

## 2021-05-13 NOTE — Telephone Encounter (Signed)
I called [employer] [place]. I do not think a PA is required, but got stuck in their phone tree and could not get out, after 30 minutes, they are sending a fax, I will look at eligibility fax maybe its on there.  To Toniann Fail to see if she can help , Im stuck   CPT for surgery is [phone] sch for Tuesday

## 2021-05-13 NOTE — Telephone Encounter (Signed)
Not sure/ I looked and thought was [place] plan which doesn't need authorization. But now I see is [place] plan, looked on [employer] not through them, will have to call but I am in clinic.   I have reached out to [place] to see if she can help /

## 2021-05-13 NOTE — Telephone Encounter (Signed)
-----   Message from Keane Scrape sent at 05/13/2021 10:11 AM EDT ----- Regarding: authorization Jasmine December from [facility] called and wanted to know if this patient needed pre-authorization for his upcoming procedure  Please call her at [phone] extension [phone]  Thanks

## 2021-05-15 LAB — HEMOGLOBIN A1C
Hgb A1c MFr Bld: 6.3 % — ABNORMAL HIGH (ref 4.8–5.6)
Mean Plasma Glucose: 134 mg/dL

## 2021-05-16 NOTE — H&P (Signed)
Chief Complaint  Patient presents with   Knee Pain      Left     James Rivas is a 50 year old male with hypertension venous stasis disease morbid obesity presents with atraumatic onset of pain in his left knee which was worked up by Dr. Hilda Lias.  He was found that he had arthritis in all 3 compartments with more disease medially and he had a medial meniscal tear.  He denies any trauma.  He did not respond well to nonoperative treatment       Past Medical History:  Diagnosis Date   Allergy     Chest pain     HTN (hypertension)     Impaired fasting glucose     Morbid obesity (HCC)     Sleep apnea      uses a CPAP   Venous stasis           Past Surgical History:  Procedure Laterality Date   COLONOSCOPY WITH PROPOFOL N/A 05/11/2020    Procedure: COLONOSCOPY WITH PROPOFOL;  Surgeon: Corbin Ade, MD;  Location: [facility];  Service: Endoscopy;  Laterality: N/A;  2:15pm   FINGER SURGERY Left      left ring finger 2 surgeries   I & D EXTREMITY   11/29/2011    Procedure: IRRIGATION AND DEBRIDEMENT EXTREMITY;  Surgeon: Sharma Covert;  Location: [facility];  Service: Orthopedics;  Laterality: Left;  I&D Right Long and Index Fingers with Tendon Repair as Needed         Family History  Problem Relation Age of Onset   Diabetes Father     Diabetes Paternal Aunt     Colon cancer Neg Hx      Social History         Tobacco Use   Smoking status: Never Smoker   Smokeless tobacco: Never Used  Vaping Use   Vaping Use: Never used  Substance Use Topics   Alcohol use: Not Currently      Comment: occ. 1 beer or a glass of wine a couple times a week.   Drug use: No    BP (!) 158/93   Pulse 75   Ht 6\' 5"  (1.956 m)   Wt (!) 385 lb (174.6 kg)   BMI 45.65 kg/m    The patient is morbidly obese otherwise well groomed no deformities   Mental status he is awake and alert and oriented x3 Psychiatric mood and affect are normal   Cardiovascular he has distal swelling and skin discoloration on both  lower extremities with multiple varicose veins extremities are otherwise warm to touch and there are no ulcerations   Musculoskeletal   Left knee tenderness medial joint line no lateral condylar tenderness mild medial condylar tenderness mild crepitance on range of motion he can fully extend the knee flexes it to about 120 degrees the knee feels stable   The MRI was on the disc   My interpretation 3 compartment degenerative changes with medial compartment disease torn medial meniscus; ACL PCL intact   The procedure has been fully reviewed with the patient; The risks and benefits of surgery have been discussed and explained and understood. Alternative treatment has also been reviewed, questions were encouraged and answered. The postoperative plan is also been reviewed.   No diagnosis found.   Recommend arthroscopy of the left knee.  The patient will need aspirin for 6 weeks after surgery to prophylactically treat potential blood clot.  I advised him about postop swelling may be  an issue   I also advised him that he may be out of work 4 to 6 weeks and we also discussed that he will have arthritic pain after the surgery

## 2021-05-16 NOTE — Telephone Encounter (Signed)
They sent me a benefits fax, and prior authorization does not appear to be required. Thanks so much. I will make sure to send for scan.

## 2021-05-17 ENCOUNTER — Encounter: Payer: Self-pay | Admitting: Orthopedic Surgery

## 2021-05-17 ENCOUNTER — Encounter (HOSPITAL_COMMUNITY): Payer: Self-pay | Admitting: Orthopedic Surgery

## 2021-05-17 ENCOUNTER — Ambulatory Visit (HOSPITAL_COMMUNITY): Payer: BC Managed Care – PPO | Admitting: Anesthesiology

## 2021-05-17 ENCOUNTER — Encounter (HOSPITAL_COMMUNITY)
Admission: RE | Disposition: A | Discharge status: Home / Self Care | Payer: Self-pay | Source: Home / Self Care | Attending: Orthopedic Surgery

## 2021-05-17 ENCOUNTER — Ambulatory Visit: Payer: BC Managed Care – PPO | Admitting: Internal Medicine

## 2021-05-17 ENCOUNTER — Other Ambulatory Visit: Payer: Self-pay

## 2021-05-17 ENCOUNTER — Ambulatory Visit (HOSPITAL_COMMUNITY)
Admission: RE | Admit: 2021-05-17 | Discharge: 2021-05-17 | Disposition: A | Discharge status: Home / Self Care | Payer: BC Managed Care – PPO | Attending: Orthopedic Surgery | Admitting: Orthopedic Surgery

## 2021-05-17 DIAGNOSIS — S83242A Other tear of medial meniscus, current injury, left knee, initial encounter: Secondary | ICD-10-CM | POA: Insufficient documentation

## 2021-05-17 DIAGNOSIS — M2419 Other articular cartilage disorders, other specified site: Secondary | ICD-10-CM | POA: Diagnosis not present

## 2021-05-17 DIAGNOSIS — M1712 Unilateral primary osteoarthritis, left knee: Secondary | ICD-10-CM | POA: Diagnosis not present

## 2021-05-17 DIAGNOSIS — Z6841 Body Mass Index (BMI) 40.0 and over, adult: Secondary | ICD-10-CM | POA: Diagnosis not present

## 2021-05-17 DIAGNOSIS — X58XXXA Exposure to other specified factors, initial encounter: Secondary | ICD-10-CM | POA: Diagnosis not present

## 2021-05-17 DIAGNOSIS — G4730 Sleep apnea, unspecified: Secondary | ICD-10-CM | POA: Diagnosis not present

## 2021-05-17 DIAGNOSIS — I878 Other specified disorders of veins: Secondary | ICD-10-CM | POA: Insufficient documentation

## 2021-05-17 DIAGNOSIS — M238X2 Other internal derangements of left knee: Secondary | ICD-10-CM | POA: Diagnosis not present

## 2021-05-17 DIAGNOSIS — S83242D Other tear of medial meniscus, current injury, left knee, subsequent encounter: Secondary | ICD-10-CM

## 2021-05-17 HISTORY — PX: KNEE ARTHROSCOPY WITH MEDIAL MENISECTOMY: SHX5651

## 2021-05-17 HISTORY — PX: MENISECTOMY: SHX5181

## 2021-05-17 LAB — GLUCOSE, CAPILLARY: Glucose-Capillary: 113 mg/dL — ABNORMAL HIGH (ref 70–99)

## 2021-05-17 SURGERY — ARTHROSCOPY, KNEE, WITH MEDIAL MENISCECTOMY
Anesthesia: General | Site: Knee | Laterality: Left

## 2021-05-17 MED ORDER — HYDROCODONE-ACETAMINOPHEN 10-325 MG PO TABS: 1.0000 | ORAL_TABLET | ORAL | 0 refills | Status: DC | PRN

## 2021-05-17 MED ORDER — FENTANYL CITRATE (PF) 100 MCG/2ML IJ SOLN
INTRAMUSCULAR | Status: DC | PRN
  Administered 2021-05-17 (×3): 50 ug via INTRAVENOUS

## 2021-05-17 MED ORDER — BUPIVACAINE-EPINEPHRINE (PF) 0.5% -1:200000 IJ SOLN
INTRAMUSCULAR | Status: DC | PRN
  Administered 2021-05-17: 30 mL via PERINEURAL

## 2021-05-17 MED ORDER — ORAL CARE MOUTH RINSE: 15.0000 mL | Freq: Once | OROMUCOSAL | Status: AC

## 2021-05-17 MED ORDER — SODIUM CHLORIDE 0.9 % IR SOLN
Status: DC | PRN
  Administered 2021-05-17 (×2): 3000 mL

## 2021-05-17 MED ORDER — ONDANSETRON HCL 4 MG/2ML IJ SOLN
INTRAMUSCULAR | Status: AC
  Filled 2021-05-17: qty 2

## 2021-05-17 MED ORDER — CEFAZOLIN IN SODIUM CHLORIDE 3-0.9 GM/100ML-% IV SOLN
3.0000 g | INTRAVENOUS | Status: AC
  Administered 2021-05-17: 3 g via INTRAVENOUS

## 2021-05-17 MED ORDER — CHLORHEXIDINE GLUCONATE 0.12 % MT SOLN
15.0000 mL | Freq: Once | OROMUCOSAL | Status: AC
  Administered 2021-05-17: 15 mL via OROMUCOSAL

## 2021-05-17 MED ORDER — PROPOFOL 10 MG/ML IV BOLUS
INTRAVENOUS | Status: DC | PRN
  Administered 2021-05-17: 275 mg via INTRAVENOUS

## 2021-05-17 MED ORDER — ROCURONIUM BROMIDE 10 MG/ML (PF) SYRINGE
PREFILLED_SYRINGE | INTRAVENOUS | Status: AC
  Filled 2021-05-17: qty 10

## 2021-05-17 MED ORDER — BUPIVACAINE-EPINEPHRINE (PF) 0.5% -1:200000 IJ SOLN
INTRAMUSCULAR | Status: AC
  Filled 2021-05-17: qty 30

## 2021-05-17 MED ORDER — ONDANSETRON HCL 4 MG/2ML IJ SOLN: 4.0000 mg | Freq: Once | INTRAMUSCULAR | Status: DC | PRN

## 2021-05-17 MED ORDER — ONDANSETRON HCL 4 MG/2ML IJ SOLN
4.0000 mg | Freq: Once | INTRAMUSCULAR | Status: AC
  Administered 2021-05-17: 4 mg via INTRAVENOUS
  Filled 2021-05-17: qty 2

## 2021-05-17 MED ORDER — CEFAZOLIN IN SODIUM CHLORIDE 3-0.9 GM/100ML-% IV SOLN
INTRAVENOUS | Status: AC
  Filled 2021-05-17: qty 100

## 2021-05-17 MED ORDER — HYDROCODONE-ACETAMINOPHEN 5-325 MG PO TABS
2.0000 | ORAL_TABLET | Freq: Once | ORAL | Status: AC
  Administered 2021-05-17: 2 via ORAL
  Filled 2021-05-17: qty 2

## 2021-05-17 MED ORDER — ROCURONIUM 10MG/ML (10ML) SYRINGE FOR MEDFUSION PUMP - OPTIME
INTRAVENOUS | Status: DC | PRN
  Administered 2021-05-17: 10 mg via INTRAVENOUS
  Administered 2021-05-17: 50 mg via INTRAVENOUS

## 2021-05-17 MED ORDER — SUCCINYLCHOLINE CHLORIDE 200 MG/10ML IV SOSY
PREFILLED_SYRINGE | INTRAVENOUS | Status: AC
  Filled 2021-05-17: qty 10

## 2021-05-17 MED ORDER — PHENYLEPHRINE 40 MCG/ML (10ML) SYRINGE FOR IV PUSH (FOR BLOOD PRESSURE SUPPORT)
PREFILLED_SYRINGE | INTRAVENOUS | Status: AC
  Filled 2021-05-17: qty 10

## 2021-05-17 MED ORDER — FENTANYL CITRATE (PF) 250 MCG/5ML IJ SOLN
INTRAMUSCULAR | Status: AC
  Filled 2021-05-17: qty 5

## 2021-05-17 MED ORDER — LIDOCAINE HCL (CARDIAC) PF 50 MG/5ML IV SOSY
PREFILLED_SYRINGE | INTRAVENOUS | Status: DC | PRN
  Administered 2021-05-17: 100 mg via INTRAVENOUS

## 2021-05-17 MED ORDER — HYDROMORPHONE HCL 1 MG/ML IJ SOLN
0.2500 mg | INTRAMUSCULAR | Status: DC | PRN
  Administered 2021-05-17: 0.5 mg via INTRAVENOUS
  Filled 2021-05-17: qty 0.5

## 2021-05-17 MED ORDER — SUCCINYLCHOLINE 20MG/ML (10ML) SYRINGE FOR MEDFUSION PUMP - OPTIME
INTRAMUSCULAR | Status: DC | PRN
  Administered 2021-05-17: 175 mg via INTRAVENOUS

## 2021-05-17 MED ORDER — MEPERIDINE HCL 50 MG/ML IJ SOLN: 6.2500 mg | INTRAMUSCULAR | Status: DC | PRN

## 2021-05-17 MED ORDER — SODIUM CHLORIDE 0.9 % IR SOLN
Status: DC | PRN
  Administered 2021-05-17: 1000 mL

## 2021-05-17 MED ORDER — PROPOFOL 10 MG/ML IV BOLUS
INTRAVENOUS | Status: AC
  Filled 2021-05-17: qty 40

## 2021-05-17 MED ORDER — IBUPROFEN 800 MG PO TABS
800.0000 mg | ORAL_TABLET | Freq: Once | ORAL | Status: AC
  Administered 2021-05-17: 800 mg via ORAL
  Filled 2021-05-17: qty 1

## 2021-05-17 MED ORDER — SUGAMMADEX SODIUM 200 MG/2ML IV SOLN
INTRAVENOUS | Status: DC | PRN
  Administered 2021-05-17: 200 mg via INTRAVENOUS

## 2021-05-17 MED ORDER — PHENYLEPHRINE HCL (PRESSORS) 10 MG/ML IV SOLN
INTRAVENOUS | Status: DC | PRN
  Administered 2021-05-17: 60 ug via INTRAVENOUS
  Administered 2021-05-17: 80 ug via INTRAVENOUS

## 2021-05-17 MED ORDER — LIDOCAINE HCL (PF) 2 % IJ SOLN
INTRAMUSCULAR | Status: AC
  Filled 2021-05-17: qty 5

## 2021-05-17 MED ORDER — MIDAZOLAM HCL 2 MG/2ML IJ SOLN
INTRAMUSCULAR | Status: AC
  Filled 2021-05-17: qty 2

## 2021-05-17 MED ORDER — MIDAZOLAM HCL 5 MG/5ML IJ SOLN
INTRAMUSCULAR | Status: DC | PRN
  Administered 2021-05-17: 2 mg via INTRAVENOUS

## 2021-05-17 MED ORDER — LACTATED RINGERS IV SOLN: INTRAVENOUS | Status: DC

## 2021-05-17 MED ORDER — ONDANSETRON HCL 4 MG/2ML IJ SOLN
INTRAMUSCULAR | Status: DC | PRN
  Administered 2021-05-17: 4 mg via INTRAVENOUS

## 2021-05-17 SURGICAL SUPPLY — 53 items
ABLATOR ASPIRATE 50D MULTI-PRT (SURGICAL WAND) ×2 IMPLANT
APL PRP STRL LF DISP 70% ISPRP (MISCELLANEOUS) ×1
BAG HAMPER (MISCELLANEOUS) ×3 IMPLANT
BLADE SHAVER TORPEDO 4X13 (MISCELLANEOUS) ×2 IMPLANT
BNDG CMPR STD VLCR NS LF 5.8X6 (GAUZE/BANDAGES/DRESSINGS) ×1
BNDG ELASTIC 6X5.8 VLCR NS LF (GAUZE/BANDAGES/DRESSINGS) ×3 IMPLANT
BNDG ELASTIC 6X5.8 VLCR STR LF (GAUZE/BANDAGES/DRESSINGS) ×4 IMPLANT
CHLORAPREP W/TINT 26 (MISCELLANEOUS) ×3 IMPLANT
CLOTH BEACON ORANGE TIMEOUT ST (SAFETY) ×3 IMPLANT
COOLER ICEMAN CLASSIC (MISCELLANEOUS) ×3 IMPLANT
COVER WAND RF STERILE (DRAPES) ×4 IMPLANT
CUFF TOURN SGL QUICK 34 (TOURNIQUET CUFF) ×3
CUFF TRNQT CYL 34X4.125X (TOURNIQUET CUFF) IMPLANT
DECANTER SPIKE VIAL GLASS SM (MISCELLANEOUS) ×2 IMPLANT
DRAPE HALF SHEET 40X57 (DRAPES) ×3 IMPLANT
ELECT REM PT RETURN 9FT ADLT (ELECTROSURGICAL) ×3
ELECTRODE REM PT RTRN 9FT ADLT (ELECTROSURGICAL) IMPLANT
GAUZE 4X4 16PLY RFD (DISPOSABLE) ×3 IMPLANT
GAUZE SPONGE 4X4 12PLY STRL (GAUZE/BANDAGES/DRESSINGS) ×3 IMPLANT
GAUZE XEROFORM 5X9 LF (GAUZE/BANDAGES/DRESSINGS) ×3 IMPLANT
GLOVE SKINSENSE NS SZ8.0 LF (GLOVE) ×2
GLOVE SKINSENSE STRL SZ8.0 LF (GLOVE) ×1 IMPLANT
GLOVE SS N UNI LF 8.5 STRL (GLOVE) ×3 IMPLANT
GLOVE SURG UNDER POLY LF SZ7 (GLOVE) ×6 IMPLANT
GOWN STRL REUS W/TWL LRG LVL3 (GOWN DISPOSABLE) ×3 IMPLANT
GOWN STRL REUS W/TWL XL LVL3 (GOWN DISPOSABLE) ×3 IMPLANT
IV NS IRRIG 3000ML ARTHROMATIC (IV SOLUTION) ×6 IMPLANT
KIT TURNOVER CYSTO (KITS) ×3 IMPLANT
MANIFOLD NEPTUNE II (INSTRUMENTS) ×3 IMPLANT
MARKER SKIN DUAL TIP RULER LAB (MISCELLANEOUS) ×3 IMPLANT
NDL SPNL 18GX3.5 QUINCKE PK (NEEDLE) ×1 IMPLANT
NEEDLE SPNL 18GX3.5 QUINCKE PK (NEEDLE) ×3 IMPLANT
NS IRRIG 1000ML POUR BTL (IV SOLUTION) ×3 IMPLANT
PACK ARTHRO LIMB DRAPE STRL (MISCELLANEOUS) ×3 IMPLANT
PAD ABD 5X9 TENDERSORB (GAUZE/BANDAGES/DRESSINGS) ×3 IMPLANT
PAD ARMBOARD 7.5X6 YLW CONV (MISCELLANEOUS) ×3 IMPLANT
PAD COLD SHLDR WRAP-ON (PAD) ×2 IMPLANT
PAD FOR LEG HOLDER (MISCELLANEOUS) ×3 IMPLANT
PADDING CAST COTTON 6X4 STRL (CAST SUPPLIES) ×3 IMPLANT
PENCIL HANDSWITCHING (ELECTRODE) ×2 IMPLANT
PICK POWER XL 45DEG (MISCELLANEOUS) ×2 IMPLANT
PORT APPOLLO RF 90DEGREE MULTI (SURGICAL WAND) IMPLANT
SET ARTHROSCOPY INST (INSTRUMENTS) ×3 IMPLANT
SET BASIN LINEN APH (SET/KITS/TRAYS/PACK) ×3 IMPLANT
SUT ETHILON 3 0 FSL (SUTURE) ×3 IMPLANT
SUT VIC AB 1 CT1 27 (SUTURE) ×3
SUT VIC AB 1 CT1 27XBRD ANTBC (SUTURE) IMPLANT
SYR 30ML LL (SYRINGE) ×3 IMPLANT
TUBE CONNECTING 12'X1/4 (SUCTIONS) ×2
TUBE CONNECTING 12X1/4 (SUCTIONS) ×4 IMPLANT
TUBING IN/OUT FLOW W/MAIN PUMP (TUBING) ×3 IMPLANT
YANKAUER SUCT 12FT TUBE ARGYLE (SUCTIONS) ×2 IMPLANT
YANKAUER SUCT BULB TIP NO VENT (SUCTIONS) ×2 IMPLANT

## 2021-05-17 NOTE — Interval H&P Note (Signed)
History and Physical Interval Note:  05/17/2021 10:40 AM  James Rivas  has presented today for surgery, with the diagnosis of Torn medial meniscus left knee.  The various methods of treatment have been discussed with the patient and family. After consideration of risks, benefits and other options for treatment, the patient has consented to  Procedure(s): KNEE ARTHROSCOPY WITH MEDIAL MENISECTOMY POSSIBLE MENISCUS REPAIR (Left) as a surgical intervention.  The patient's history has been reviewed, patient examined, no change in status, stable for surgery.  I have reviewed the patient's chart and labs.  Questions were answered to the patient's satisfaction.     Fuller Canada

## 2021-05-17 NOTE — Op Note (Signed)
05/17/2021  1:06 PM  Knee arthroscopy dictation  Preop diagnosis torn medial meniscus left knee  Postop diagnosis torn medial meniscus left knee chondral defect medial femoral condyle left knee  Procedure arthroscopy arthroscopy left knee microfracture medial femoral condyle partial medial meniscectomy  Surgeon [name]  Operative findings  MEDIAL chondral defect medial femoral condyle 10 x 7 full-thickness defect, medial meniscus tear radial tear posterior horn  LATERAL normal  PTF normal  NOTCH normal     The patient was identified in the preoperative holding area using 2 approved identification mechanisms. The chart was reviewed and updated. The surgical site was confirmed as left knee and marked with an indelible marker.  The patient was taken to the operating room for anesthesia. After successful general anesthesia, 3 g Ancef was used as IV antibiotics.  The patient was placed in the supine position with the (left knee) the operative extremity in an arthroscopic leg holder and the opposite extremity in a padded leg holder.  The timeout was executed.  A lateral portal was established with an 11 blade and the scope was introduced into the joint. A diagnostic arthroscopy was performed in circumferential manner examining the entire knee joint. A medial portal was established and the diagnostic arthroscopy was repeated using a probe to palpate intra-articular structures as they were encountered.    The medial meniscus was resected using a duckbill forceps. The meniscal fragments were removed with a motorized shaver. The meniscus was balanced with a combination of a motorized shaver and a 50 ArthroCare wand until a stable rim was obtained.  A torpedo wand was then used to debride the chondral defect and remove chondral flaps that were torn away from the bone.  I then decided to do a microfracture technique to the defect.  I prepared the bed and then made an accessory portal  with a spinal needle and then placed the Arthrex micro pick through the accessory portal and drilled 5 holes into the subchondral bone  Stop the pump placed a suction device and restarted the pump and observed blood in all 5 holes  The arthroscopic pump was placed on the wash mode and any excess debris was removed from the joint using suction.  60 cc of Marcaine with epinephrine was injected through the arthroscope.  2 of the portals were closed with 3-0 nylon suture.  The lateral portal had excessive bleeding I had to extend the portal incision and then I tried cautery but then had to place three #1 Vicryl sutures to close the capsule to stop the bleeding  A sterile bandage, Ace wrap and Cryo/Cuff was placed and the Cryo/Cuff was activated. The patient was taken to the recovery room in stable condition.  PHYSICIAN ASSISTANT: no  ASSISTANTS: none   ANESTHESIA:   General  EBL:  none   BLOOD ADMINISTERED:none  DRAINS: none   LOCAL MEDICATIONS USED:  MARCAINE     SPECIMEN:  No Specimen  DISPOSITION OF SPECIMEN:  N/A  COUNTS:  YES   DICTATION: .Dragon Dictation  PLAN OF CARE: Discharge to home after PACU  PATIENT DISPOSITION:  PACU - hemodynamically stable.   Delay start of Pharmacological VTE agent (>24hrs) due to surgical blood loss or risk of bleeding: not applicable

## 2021-05-17 NOTE — Anesthesia Preprocedure Evaluation (Signed)
Anesthesia Evaluation  Patient identified by MRN, date of birth, ID band Patient awake    Reviewed: Allergy & Precautions, NPO status , Patient's Chart, lab work & pertinent test results  History of Anesthesia Complications Negative for: history of anesthetic complications  Airway Mallampati: II  TM Distance: >3 FB Neck ROM: Full    Dental  (+) Dental Advisory Given, Missing, Chipped Right upper teeth chipped:   Pulmonary shortness of breath, sleep apnea and Continuous Positive Airway Pressure Ventilation ,    Pulmonary exam normal breath sounds clear to auscultation       Cardiovascular Exercise Tolerance: Good hypertension, Pt. on medications + DOE  Normal cardiovascular exam Rhythm:Regular Rate:Normal  25-Nov-2020 20:13:42 [facility] ROUTINE RECORD Normal sinus rhythm Possible Anterior infarct , age undetermined Abnormal ECG No significant change since last tracing Confirmed by Susy Frizzle [phone]) on 11/26/2020 6:48:20 PM   Neuro/Psych negative neurological ROS  negative psych ROS   GI/Hepatic negative GI ROS, Neg liver ROS,   Endo/Other  Morbid obesity  Renal/GU negative Renal ROS     Musculoskeletal  (+) Arthritis , Osteoarthritis,    Abdominal   Peds  Hematology negative hematology ROS (+)   Anesthesia Other Findings   Reproductive/Obstetrics                           Anesthesia Physical Anesthesia Plan  ASA: 3  Anesthesia Plan: General   Post-op Pain Management:    Induction: Intravenous  PONV Risk Score and Plan: 3 and Ondansetron, Dexamethasone and Midazolam  Airway Management Planned: Oral ETT  Additional Equipment:   Intra-op Plan:   Post-operative Plan: Extubation in OR  Informed Consent: I have reviewed the patients History and Physical, chart, labs and discussed the procedure including the risks, benefits and alternatives for the  proposed anesthesia with the patient or authorized representative who has indicated his/her understanding and acceptance.     Dental advisory given  Plan Discussed with: CRNA and Surgeon  Anesthesia Plan Comments:         Anesthesia Quick Evaluation

## 2021-05-17 NOTE — Brief Op Note (Signed)
05/17/2021  1:03 PM  PATIENT:  James Rivas  50 y.o. male  PRE-OPERATIVE DIAGNOSIS:  Torn medial meniscus left knee  POST-OPERATIVE DIAGNOSIS:  Torn medial meniscus left knee; Chondral flap tear Medial Femoral Condyle  PROCEDURE:  Procedure(s): KNEE ARTHROSCOPY WITH MICROFRACTURE (Left) PARTIAL MEDIAL MENISECTOMY (Left)  SURGEON:  Surgeon(s) and Role:    Vickki Hearing, MD - Primary  PHYSICIAN ASSISTANT:   ASSISTANTS: none   ANESTHESIA:   general  EBL:  20 mL   BLOOD ADMINISTERED:none  DRAINS: none   LOCAL MEDICATIONS USED:  MARCAINE     SPECIMEN:  No Specimen  DISPOSITION OF SPECIMEN:  N/A  COUNTS:  YES  TOURNIQUET:  * Missing tourniquet times found for documented tourniquets in log: [id number] *  DICTATION: .Dragon Dictation  PLAN OF CARE: Discharge to home after PACU  PATIENT DISPOSITION:  PACU - hemodynamically stable.   Delay start of Pharmacological VTE agent (>24hrs) due to surgical blood loss or risk of bleeding: not applicable

## 2021-05-17 NOTE — Anesthesia Procedure Notes (Signed)
Procedure Name: Intubation Date/Time: 05/17/2021 11:22 AM Performed by: Ollen Bowl, CRNA Pre-anesthesia Checklist: Patient identified, Patient being monitored, Timeout performed, Emergency Drugs available and Suction available Patient Re-evaluated:Patient Re-evaluated prior to induction Oxygen Delivery Method: Circle system utilized Preoxygenation: Pre-oxygenation with 100% oxygen Induction Type: IV induction Ventilation: Mask ventilation without difficulty Laryngoscope Size: Mac and 4 Grade View: Grade II Tube type: Oral Tube size: 8.0 mm Number of attempts: 1 Airway Equipment and Method: Stylet Placement Confirmation: ETT inserted through vocal cords under direct vision, positive ETCO2 and breath sounds checked- equal and bilateral Secured at: 21 cm Tube secured with: Tape Dental Injury: Teeth and Oropharynx as per pre-operative assessment

## 2021-05-17 NOTE — Progress Notes (Signed)
Pt was instructed how to use incentive spirometer by RN. Pt demonstrated technique correctly.

## 2021-05-17 NOTE — Anesthesia Postprocedure Evaluation (Signed)
Anesthesia Post Note  Patient: James Rivas  Procedure(s) Performed: KNEE ARTHROSCOPY WITH MICROFRACTURE (Left: Knee) PARTIAL MEDIAL MENISECTOMY (Left: Knee)  Patient location during evaluation: PACU Anesthesia Type: General Level of consciousness: awake and alert Pain management: pain level controlled Vital Signs Assessment: post-procedure vital signs reviewed and stable Respiratory status: spontaneous breathing Cardiovascular status: blood pressure returned to baseline and stable Postop Assessment: no apparent nausea or vomiting Anesthetic complications: no   No notable events documented.   Last Vitals:  Vitals:   05/17/21 1330 05/17/21 1345  BP: 126/78 124/84  Pulse: 68 70  Resp: (!) 9 12  Temp:    SpO2: 100% 96%    Last Pain:  Vitals:   05/17/21 1330  TempSrc:   PainSc: 7                  Demya Scruggs

## 2021-05-17 NOTE — Transfer of Care (Signed)
Immediate Anesthesia Transfer of Care Note  Patient: DAESHAUN SPECHT  Procedure(s) Performed: KNEE ARTHROSCOPY WITH MICROFRACTURE (Left: Knee) PARTIAL MEDIAL MENISECTOMY (Left: Knee)  Patient Location: PACU  Anesthesia Type:General  Level of Consciousness: awake  Airway & Oxygen Therapy: Patient Spontanous Breathing and Patient connected to nasal cannula oxygen  Post-op Assessment: Report given to RN  Post vital signs: Reviewed and stable  Last Vitals:  Vitals Value Taken Time  BP 130/72 05/17/21 1308  Temp 36.4 C 05/17/21 1308  Pulse 77 05/17/21 1312  Resp 18 05/17/21 1312  SpO2 93 % 05/17/21 1312  Vitals shown include unvalidated device data.  Last Pain:  Vitals:   05/17/21 0838  TempSrc: Oral  PainSc: 0-No pain      Patients Stated Pain Goal: 8 (05/17/21 8657)  Complications: No notable events documented.

## 2021-05-18 ENCOUNTER — Encounter (HOSPITAL_COMMUNITY): Payer: Self-pay | Admitting: Orthopedic Surgery

## 2021-05-18 ENCOUNTER — Other Ambulatory Visit: Payer: Self-pay

## 2021-05-18 ENCOUNTER — Emergency Department (HOSPITAL_COMMUNITY)
Admission: EM | Admit: 2021-05-18 | Discharge: 2021-05-18 | Disposition: A | Discharge status: Home / Self Care | Payer: BC Managed Care – PPO | Attending: Emergency Medicine | Admitting: Emergency Medicine

## 2021-05-18 ENCOUNTER — Telehealth: Payer: Self-pay | Admitting: Orthopedic Surgery

## 2021-05-18 DIAGNOSIS — I10 Essential (primary) hypertension: Secondary | ICD-10-CM | POA: Insufficient documentation

## 2021-05-18 DIAGNOSIS — R202 Paresthesia of skin: Secondary | ICD-10-CM | POA: Diagnosis not present

## 2021-05-18 DIAGNOSIS — Z8616 Personal history of COVID-19: Secondary | ICD-10-CM | POA: Diagnosis not present

## 2021-05-18 DIAGNOSIS — Z79899 Other long term (current) drug therapy: Secondary | ICD-10-CM | POA: Diagnosis not present

## 2021-05-18 MED ORDER — METHOCARBAMOL 500 MG PO TABS: 500.0000 mg | ORAL_TABLET | Freq: Three times a day (TID) | ORAL | 0 refills | Status: DC

## 2021-05-18 NOTE — ED Provider Notes (Signed)
[facility] EMERGENCY DEPARTMENT Provider Note   CSN: [id number] Arrival date & time: 05/18/21  1715     History Chief Complaint  Patient presents with   Leg Pain    James Rivas is a 50 y.o. male.   Leg Pain Associated symptoms: no back pain, no fatigue, no fever and no neck pain        James Rivas is a 50 y.o. male past medical history of obesity, venous stasis, hypertension, who presents to the Emergency Department for evaluation of numbness and tingling (pins and needles) and coolness localized to the upper lateral right leg.  Symptoms present since waking this morning.  He had arthroscopic procedure with medial meniscectomy of the left knee yesterday.  He admits that he has been sitting in a chair with his left leg up and his right leg down sitting with pressure to his right buttock and hip area.  Symptoms involve the right thigh only.  No known injury of his right leg.  He does admit to history of spasms to his lower back.  No history of DVT or PE.  Denies abdominal pain, shortness of breath, calf pain, numbness or swelling.  No pain of the right leg   Past Medical History:  Diagnosis Date   Allergy    Arthritis    Chest pain    Gout    HTN (hypertension)    Impaired fasting glucose    Morbid obesity (HCC)    Sleep apnea    uses a CPAP   Venous stasis     Patient Active Problem List   Diagnosis Date Noted   Chondral defect of condyle of left femur    Acute medial meniscus tear of left knee    History of COVID-19 12/20/2020   Cough in adult patient 11/18/2020   Colon cancer screening 03/22/2020   Obstructive sleep apnea 01/05/2020   Morbid obesity (HCC) 10/15/2016   Allergic rhinitis 01/06/2015   Prostate hypertrophy 01/06/2015   Venous stasis 03/23/2014   Venous stasis dermatitis 03/23/2014   Chronic chest pain 06/22/2013   Dyspnea 05/18/2013   Chest pain    HTN (hypertension)     Past Surgical History:  Procedure Laterality Date   COLONOSCOPY WITH  PROPOFOL N/A 05/11/2020   Procedure: COLONOSCOPY WITH PROPOFOL;  Surgeon: Corbin Ade, MD;  Location: [facility];  Service: Endoscopy;  Laterality: N/A;  2:15pm   FINGER SURGERY Left    left ring finger 2 surgeries   I & D EXTREMITY  11/29/2011   Procedure: IRRIGATION AND DEBRIDEMENT EXTREMITY;  Surgeon: Sharma Covert;  Location: [facility] OR;  Service: Orthopedics;  Laterality: Left;  I&D Right Long and Index Fingers with Tendon Repair as Needed   KNEE ARTHROSCOPY WITH MEDIAL MENISECTOMY Left 05/17/2021   Procedure: KNEE ARTHROSCOPY WITH MICROFRACTURE;  Surgeon: Vickki Hearing, MD;  Location: [facility];  Service: Orthopedics;  Laterality: Left;   MENISECTOMY Left 05/17/2021   Procedure: PARTIAL MEDIAL MENISECTOMY;  Surgeon: Vickki Hearing, MD;  Location: [facility];  Service: Orthopedics;  Laterality: Left;       Family History  Problem Relation Age of Onset   Diabetes Father    Diabetes Paternal Aunt    Colon cancer Neg Hx     Social History   Tobacco Use   Smoking status: Never   Smokeless tobacco: Never  Vaping Use   Vaping Use: Never used  Substance Use Topics   Alcohol use: Not Currently  Comment: occ. 1 beer or a glass of wine a couple times a week.    Drug use: No    Home Medications Prior to Admission medications   Medication Sig Start Date End Date Taking? Authorizing Provider  albuterol (VENTOLIN HFA) 108 (90 Base) MCG/ACT inhaler Inhale 2 puffs into the lungs every 4 (four) hours as needed for wheezing or shortness of breath. 12/01/20   Laroy Apple M, DO  allopurinol (ZYLOPRIM) 300 MG tablet Take 1 tablet (300 mg total) by mouth daily. 02/22/21   Darreld Mclean, MD  chlorthalidone (HYGROTON) 25 MG tablet TAKE 1 TABLET(25 MG) BY MOUTH DAILY 04/06/21   Ladona Ridgel, Malena M, DO  enalapril (VASOTEC) 20 MG tablet TAKE 1 TABLET(20 MG) BY MOUTH DAILY 01/25/21   Laroy Apple M, DO  HYDROcodone-acetaminophen (NORCO) 10-325 MG tablet Take 1 tablet by mouth every 4 (four) hours  as needed. 05/17/21   Vickki Hearing, MD  metoprolol tartrate (LOPRESSOR) 25 MG tablet Take 0.5 tablets (12.5 mg total) by mouth 2 (two) times daily. TAKE 1/2 TABLET(12.5 MG) BY MOUTH TWICE DAILY 12/20/20   Ladona Ridgel, Malena M, DO  tiZANidine (ZANAFLEX) 4 MG tablet Take 1 tablet (4 mg total) by mouth every 6 (six) hours as needed for muscle spasms. 01/21/20   Merlyn Albert, MD    Allergies    Patient has no known allergies.  Review of Systems   Review of Systems  Constitutional:  Negative for chills, fatigue and fever.  HENT:  Negative for sore throat and trouble swallowing.   Respiratory:  Negative for shortness of breath.   Cardiovascular:  Negative for chest pain and leg swelling.  Gastrointestinal:  Negative for abdominal pain, nausea and vomiting.  Genitourinary:  Negative for dysuria, flank pain and hematuria.  Musculoskeletal:  Negative for arthralgias, back pain, myalgias, neck pain and neck stiffness.  Skin:  Negative for color change, pallor, rash and wound.  Neurological:  Negative for dizziness, weakness and numbness (pins and needles sensation of the right upper leg).  Hematological:  Does not bruise/bleed easily.   Physical Exam Updated Vital Signs BP (!) 148/88 (BP Location: Right Arm)   Pulse 74   Temp 98.3 F (36.8 C) (Oral)   Resp 19   Ht 6\' 5"  (1.956 m)   Wt (!) 163.3 kg   SpO2 98%   BMI 42.69 kg/m   Physical Exam Vitals and nursing note reviewed.  Constitutional:      Appearance: Normal appearance.  HENT:     Head: Normocephalic.  Eyes:     Pupils: Pupils are equal, round, and reactive to light.  Neck:     Thyroid: No thyromegaly.     Meningeal: Kernig's sign absent.  Cardiovascular:     Rate and Rhythm: Normal rate and regular rhythm.     Pulses: Normal pulses.     Comments: Dorsalis pedis pulses palpable bilaterally, also heard with Doppler.  Bilateral PT pulses heard with Doppler. Pulmonary:     Effort: Pulmonary effort is normal.      Breath sounds: Normal breath sounds. No wheezing.  Abdominal:     Palpations: Abdomen is soft.     Tenderness: There is no abdominal tenderness. There is no guarding or rebound.  Musculoskeletal:        General: Normal range of motion.     Cervical back: Normal range of motion and neck supple.     Comments: Extremities are symmetrically warm to touch.   no edema, erythema or pallor.  No right calf pain or tenderness.  Patient has varicosities of bilateral lower extremities.  Skin:    General: Skin is warm and dry.     Capillary Refill: Capillary refill takes less than 2 seconds.     Findings: No bruising, erythema or rash.  Neurological:     General: No focal deficit present.     Mental Status: He is alert and oriented to person, place, and time.     Sensory: No sensory deficit.     Motor: No weakness.    ED Results / Procedures / Treatments   Labs (all labs ordered are listed, but only abnormal results are displayed) Labs Reviewed - No data to display  EKG None  Radiology No results found.  Procedures Procedures   Medications Ordered in ED Medications - No data to display  ED Course  I have reviewed the triage vital signs and the nursing notes.  Pertinent labs & imaging results that were available during my care of the patient were reviewed by me and considered in my medical decision making (see chart for details).    MDM Rules/Calculators/A&P                          Patient here with sensation of coolness, stinging numbness and tingling localized to lateral aspect of the right upper extremity.  Neurovascularly intact.  He has palpable DP pulses bilaterally, PT pulses heard bilaterally with Doppler.  Extremities are warm symmetrically.  No skin changes.  No calf pain, erythema or edema.  Symptoms are likely related to paresthesias or secondary to nerve compression from recent surgery, clinical suspicion for DVT is low.    Pt reassured, has f/u appt with ortho tomorrow.   Appears appropriate for d/c home.   Final Clinical Impression(s) / ED Diagnoses Final diagnoses:  Paresthesias    Rx / DC Orders ED Discharge Orders     None        Rosey Bath 05/18/21 1914    Cheryll Cockayne, MD 05/27/21 [phone]

## 2021-05-18 NOTE — Telephone Encounter (Signed)
Front office received call back from patient/wife Misty Stanley Location manager on file). We have scheduled appointment per D Harrison's response to schedule here in office tomorrow, 05/19/21; patient aware* *Patient's wife called back a few minutes later to relay that his leg is now 'cold' as well as numb.  States feels best to take him to the Emergency room.  Sending message to Dr [name]. Practice administrator Toniann Fail also aware.

## 2021-05-18 NOTE — ED Triage Notes (Addendum)
Pt reports intermittent R sided leg pain, tingling, and coolness onset last night. Pt reports having surgery on the L knee yesterday. + pulses, + sensation, + movement

## 2021-05-18 NOTE — Telephone Encounter (Signed)
Patient has question following surgery of 05/17/21 - asking about why the opposite leg is feeling numb? Please advise.

## 2021-05-18 NOTE — Discharge Instructions (Addendum)
Your symptoms are likely related to irritation of the nerve from your recent surgery.  I recommend that you elevate your right leg as well as your left.  Keep your appointment with your orthopedic provider for tomorrow.  Return to the emergency department for any new or worsening symptoms.

## 2021-05-18 NOTE — Telephone Encounter (Signed)
I have advised wife I do not think the numbness in his opposite leg is from surgery or from what he had done but told her I will send message to Dr [name]

## 2021-05-19 ENCOUNTER — Encounter: Payer: Self-pay | Admitting: Orthopedic Surgery

## 2021-05-19 ENCOUNTER — Ambulatory Visit (INDEPENDENT_AMBULATORY_CARE_PROVIDER_SITE_OTHER): Payer: BC Managed Care – PPO | Admitting: Orthopedic Surgery

## 2021-05-19 DIAGNOSIS — Z09 Encounter for follow-up examination after completed treatment for conditions other than malignant neoplasm: Secondary | ICD-10-CM

## 2021-05-19 NOTE — Patient Instructions (Signed)
1 you have a stretch nerve on the right probably from positioning during surgery this should heal in 6 to 8 weeks  2 ice the knee 4-6 times a day for an hour  3 continue your knee bends 25x3 times a day  4 ibuprofen 800 mg twice a day especially at night

## 2021-05-19 NOTE — Progress Notes (Signed)
Chief Complaint  Patient presents with   Post-op Follow-up    05/17/21 returns early with complaints of numbness right thigh/ has improved today    Encounter Diagnosis  Name Primary?   Postop check Yes     Unscheduled visit  Patient went to ER yesterday because his right leg went numb  He had arthroscopy of the left knee complains of numbness on the anterior part of his thigh no other symptoms  Exam shows decreased sensation anterior and lateral thigh otherwise neurovascularly intact  Left knee looks good no drainage  Recommend ice ibuprofen come back Wednesday for suture removal

## 2021-05-23 ENCOUNTER — Ambulatory Visit: Payer: BC Managed Care – PPO | Admitting: Internal Medicine

## 2021-05-25 ENCOUNTER — Encounter: Payer: Self-pay | Admitting: Orthopedic Surgery

## 2021-05-25 ENCOUNTER — Ambulatory Visit (HOSPITAL_COMMUNITY)
Admission: RE | Admit: 2021-05-25 | Discharge: 2021-05-25 | Disposition: A | Discharge status: Home / Self Care | Payer: BC Managed Care – PPO | Source: Ambulatory Visit | Attending: Orthopedic Surgery | Admitting: Orthopedic Surgery

## 2021-05-25 ENCOUNTER — Telehealth: Payer: Self-pay | Admitting: Orthopedic Surgery

## 2021-05-25 ENCOUNTER — Ambulatory Visit (INDEPENDENT_AMBULATORY_CARE_PROVIDER_SITE_OTHER): Payer: BC Managed Care – PPO | Admitting: Orthopedic Surgery

## 2021-05-25 ENCOUNTER — Other Ambulatory Visit: Payer: Self-pay

## 2021-05-25 DIAGNOSIS — Z9889 Other specified postprocedural states: Secondary | ICD-10-CM | POA: Insufficient documentation

## 2021-05-25 DIAGNOSIS — M7989 Other specified soft tissue disorders: Secondary | ICD-10-CM | POA: Diagnosis not present

## 2021-05-25 DIAGNOSIS — M79662 Pain in left lower leg: Secondary | ICD-10-CM | POA: Insufficient documentation

## 2021-05-25 IMAGING — DX RIGHT KNEE - COMPLETE 4+ VIEW
4 series · 4 of 4 positions shown · non-contrast
Comparison: None.

CLINICAL DATA: Anterior right knee pain following move into a third
floor apartment

EXAM:
RIGHT KNEE - COMPLETE 4+ VIEW

[knee ap (1 of 3)]
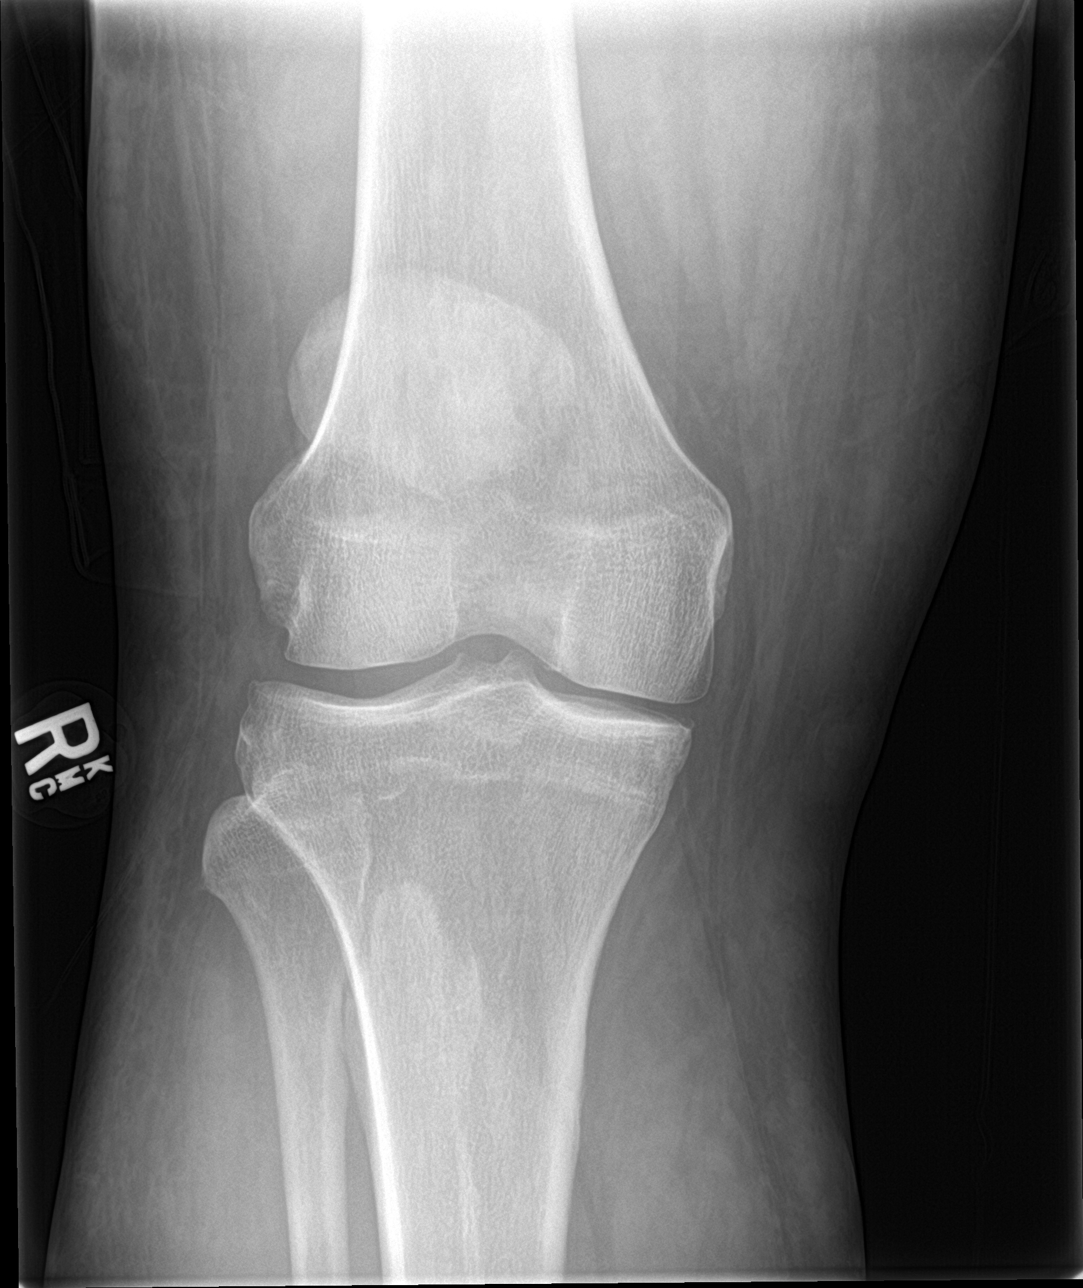

[knee lat]
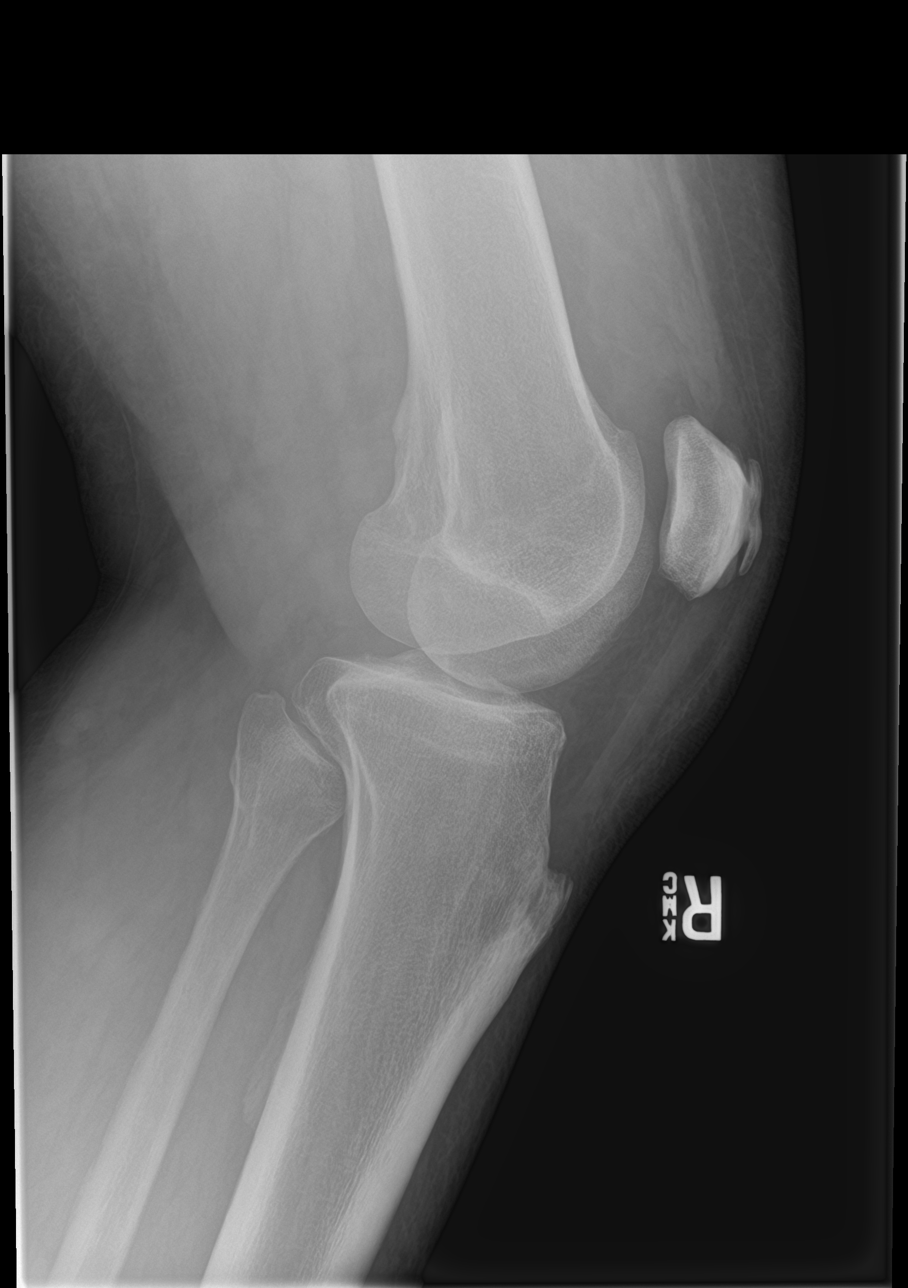

[knee ap (2 of 3)]
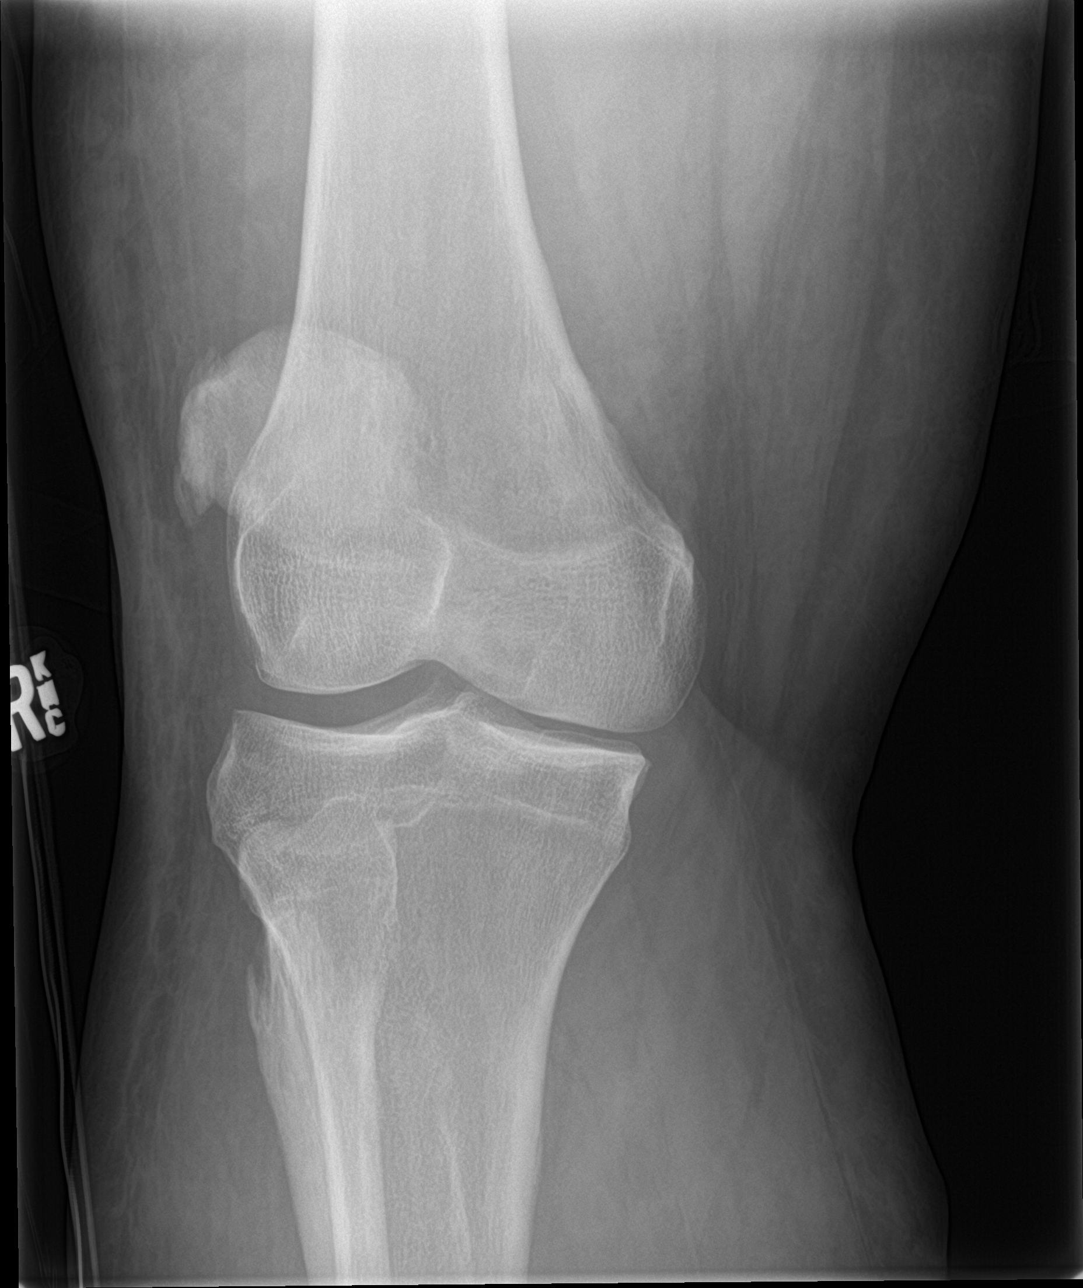

[knee ap (3 of 3)]
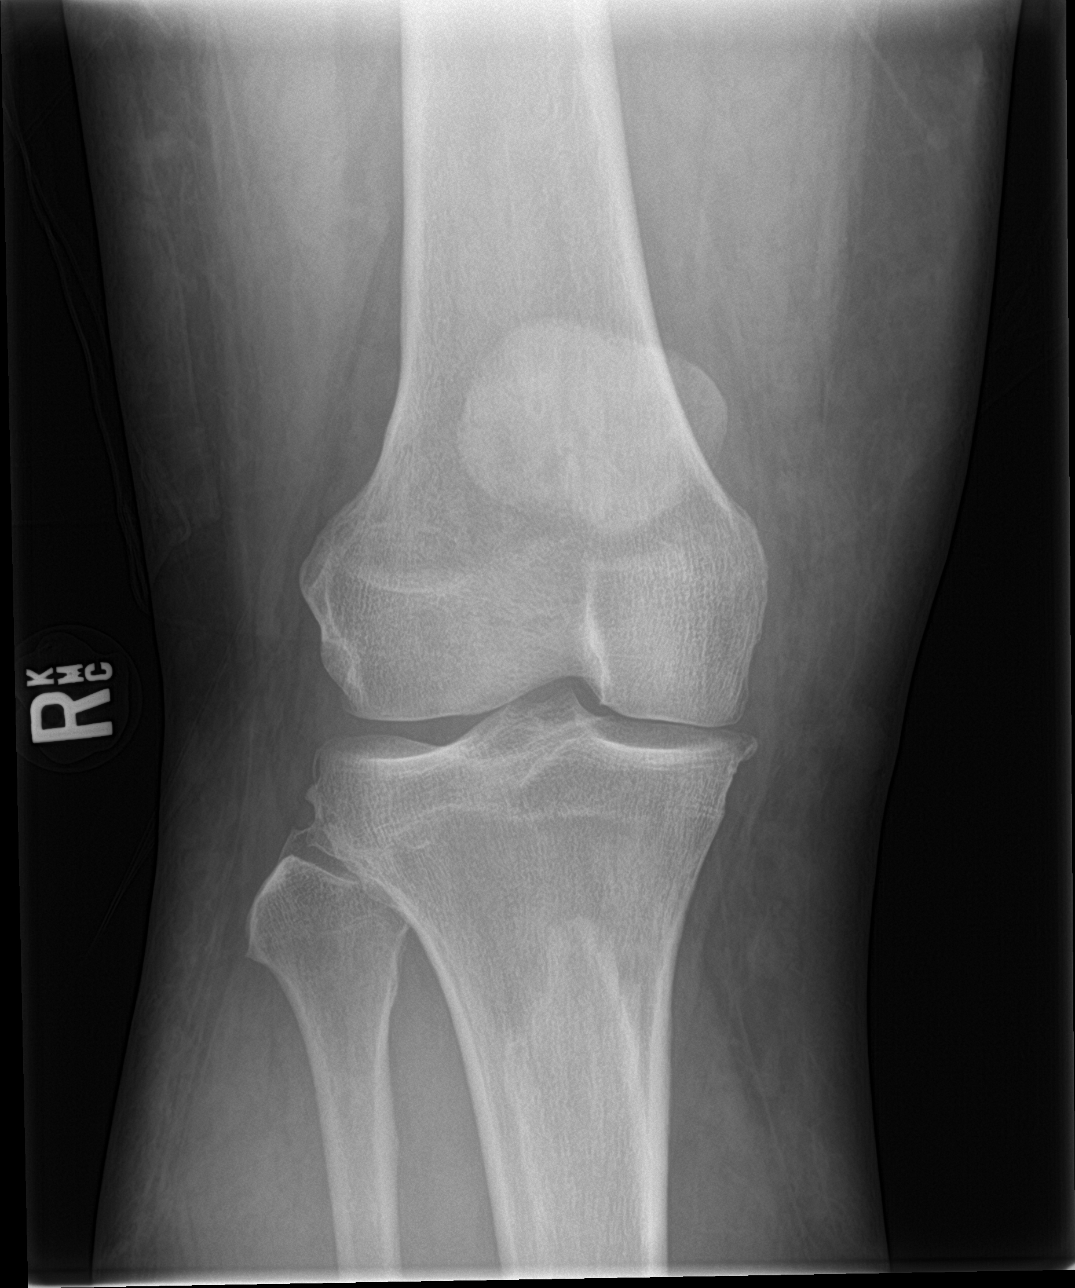

[4 of 4 positions shown; findings below may reference images not displayed]

FINDINGS: Minimal tricompartmental spurring. Enthesopathic changes are noted
at the patellar insertions of the distal quadriceps and patellar
tendons as well as enthesopathic spurring of the tibial spine. There
is mild thickening of the distal patellar tendon near its insertion.
Slight overlying soft tissue thickening is present. There is a
moderate right knee joint effusion.
IMPRESSION: Moderate right knee joint effusion. No fracture.

Features suggesting patellar tendinosis.

## 2021-05-25 NOTE — Telephone Encounter (Signed)
Called patient; left message, regarding form (from [facility] [employer])* *Pt returned call. Verified out of work dates, and discussed that our office will complete the [employer] form for his short-term disability dates out of work related to surgery. Authorizat/release form signed. Patient aware as discussed previously, [employer] will complete other 2 forms, [employer], and [employer] short-term disability for work.

## 2021-05-25 NOTE — Patient Instructions (Signed)
CONTINUE THE ASPIRIN  RESUME THE ALLOPURINOL  RESUME THE NAPROSYN  STOP HYDROCODONE AND START PERCOCET   START HOME EXERCISES   YOU HAVE AN ULTRASOUND SCHEDULED TO TEST FOR BLOOD CLOT

## 2021-05-25 NOTE — Telephone Encounter (Signed)
Done

## 2021-05-25 NOTE — Progress Notes (Signed)
POST OP   Chief Complaint  Patient presents with   Post-op Follow-up    Knee left    Encounter Diagnosis  Name Primary?   S/P arthroscopy of left knee 05/17/21 Yes   C/O RIGHT LEG INTERMITTENT NUMBNESS  SEVERE PAIN AT NIGHT   CALF TENDER OPERATIVE LEG   James Rivas is moving his knee 0 to 90 degrees he has some bloody drainage as expected from the microfracture  He will start home exercises  Change over to Percocet  Continue allopurinol and Naprosyn as well as aspirin  Also getting a DVT study  If negative follow-up 4 weeks  If positive will change the aspirin to an anticoagulant that stronger   PROCEDURE: Arthroscopy left knee with medial meniscectomy and microfracture  Preop diagnosis torn medial meniscus left knee  Postop diagnosis torn medial meniscus left knee chondral defect medial femoral condyle left knee  Procedure arthroscopy left knee microfracture medial femoral condyle partial medial meniscectomy   Surgeon [name]   Operative findings   MEDIAL chondral defect medial femoral condyle 10 x 7 full-thickness defect, medial meniscus tear radial tear posterior horn   POV #2  Meds related: NORCO 10 , ASA   [name] developed some numbness in his right thigh after surgery thought to be secondary to positioning  He is here for his routine postop visit although we saw him on June 23 to evaluate the tingling in the leg

## 2021-06-02 ENCOUNTER — Ambulatory Visit: Payer: BC Managed Care – PPO | Admitting: Internal Medicine

## 2021-06-20 ENCOUNTER — Other Ambulatory Visit: Payer: Self-pay

## 2021-06-20 ENCOUNTER — Encounter: Payer: Self-pay | Admitting: Nurse Practitioner

## 2021-06-20 ENCOUNTER — Ambulatory Visit (INDEPENDENT_AMBULATORY_CARE_PROVIDER_SITE_OTHER): Payer: BC Managed Care – PPO | Admitting: Nurse Practitioner

## 2021-06-20 VITALS — BP 148/79 | HR 80 | Temp 97.7°F | Ht 77.0 in | Wt 384.0 lb

## 2021-06-20 DIAGNOSIS — M109 Gout, unspecified: Secondary | ICD-10-CM | POA: Insufficient documentation

## 2021-06-20 DIAGNOSIS — R7303 Prediabetes: Secondary | ICD-10-CM

## 2021-06-20 DIAGNOSIS — I10 Essential (primary) hypertension: Secondary | ICD-10-CM

## 2021-06-20 DIAGNOSIS — Z139 Encounter for screening, unspecified: Secondary | ICD-10-CM

## 2021-06-20 DIAGNOSIS — Z7689 Persons encountering health services in other specified circumstances: Secondary | ICD-10-CM

## 2021-06-20 DIAGNOSIS — R7301 Impaired fasting glucose: Secondary | ICD-10-CM | POA: Diagnosis not present

## 2021-06-20 MED ORDER — SAXENDA 18 MG/3ML ~~LOC~~ SOPN: PEN_INJECTOR | SUBCUTANEOUS | 0 refills | Status: DC

## 2021-06-20 NOTE — Assessment & Plan Note (Signed)
BP Readings from Last 3 Encounters:  06/20/21 (!) 148/79  05/18/21 (!) 148/88  05/17/21 132/72   -last 2 readings have been elevated; had recent left knee arthroscopy

## 2021-06-20 NOTE — Assessment & Plan Note (Addendum)
Wt Readings from Last 10 Encounters:  06/20/21 (!) 384 lb (174.2 kg)  05/18/21 (!) 360 lb 0.2 oz (163.3 kg)  05/12/21 (!) 360 lb (163.3 kg)  05/04/21 (!) 385 lb (174.6 kg)  04/26/21 (!) 386 lb 9.6 oz (175.4 kg)  04/12/21 (!) 385 lb (174.6 kg)  04/05/21 (!) 386 lb (175.1 kg)  03/08/21 (!) 382 lb (173.3 kg)  02/22/21 (!) 383 lb (173.7 kg)  02/09/21 (!) 388 lb 3.2 oz (176.1 kg)   -weight stable over the last 3 months -Rx. Saxenda; expect PA; if unable to obtain this will consider Ozempic vs medical weight management

## 2021-06-20 NOTE — Assessment & Plan Note (Signed)
Lab Results  Component Value Date   HGBA1C 6.3 (H) 05/12/2021   -Rx. Saxenda

## 2021-06-20 NOTE — Patient Instructions (Signed)
We will meet up in 1 month for a med check on Saxenda. If there are any snags with the insurance prior authorization please let us know. We will discuss any issues with the medication or consider a consultation with our medical weight management team at that time.  We will check labs near the end of September or a little later to recheck your A1c.

## 2021-06-20 NOTE — Progress Notes (Signed)
New Patient Office Visit  Subjective:  Patient ID: James Rivas, male    DOB: 08/05/1971  Age: 50 y.o. MRN: [id number]  CC:  Chief Complaint  Patient presents with   New Patient (Initial Visit)    Here to establish care. Concerned about weight gain over the past year.    HPI James Rivas presents for new patient visit. Transferring care from Dr. Lovena Le. Last physical 02/09/21 and last labs were drawn around that time.  He is concerned with weight gain. He states that he would like to lose weight.   BMI Readings from Last 3 Encounters:  06/20/21 45.54 kg/m  05/18/21 42.69 kg/m  05/12/21 42.69 kg/m      Past Medical History:  Diagnosis Date   Allergy    Arthritis    left knee   Chest pain    Gout    Gout    HTN (hypertension)    Impaired fasting glucose    Morbid obesity (HCC)    Sleep apnea    uses a CPAP   Venous stasis     Past Surgical History:  Procedure Laterality Date   COLONOSCOPY WITH PROPOFOL N/A 05/11/2020   Procedure: COLONOSCOPY WITH PROPOFOL;  Surgeon: Daneil Dolin, MD;  [facility];  Service: Endoscopy;  Laterality: N/A;  2:15pm   FINGER SURGERY Left    left ring finger 2 surgeries   I & D EXTREMITY  11/29/2011   Procedure: IRRIGATION AND DEBRIDEMENT EXTREMITY;  Surgeon: Linna Hoff;  Location: [facility];  Service: Orthopedics;  Laterality: Left;  I&D Right Long and Index Fingers with Tendon Repair as Needed   KNEE ARTHROSCOPY WITH MEDIAL MENISECTOMY Left 05/17/2021   Procedure: KNEE ARTHROSCOPY WITH MICROFRACTURE;  Surgeon: Carole Civil, MD;  Location: [facility];  Service: Orthopedics;  Laterality: Left;   MENISECTOMY Left 05/17/2021   Procedure: PARTIAL MEDIAL MENISECTOMY;  Surgeon: Carole Civil, MD;  Location: [facility];  Service: Orthopedics;  Laterality: Left;    Family History  Problem Relation Age of Onset   Diabetes Father    Diabetes Paternal Aunt    Colon cancer Neg Hx     Social History   Socioeconomic History    Marital status: Married    Spouse name: Not on file   Number of children: 3   Years of education: Not on file   Highest education level: Not on file  Occupational History   Occupation: [employer]    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 1 beer or a glass of wine a couple times a week.    Drug use: No   Sexual activity: Never  Other Topics Concern   Not on file  Social History Narrative   Not on file   Social Determinants of Health   Financial Resource Strain: Not on file  Food Insecurity: Not on file  Transportation Needs: Not on file  Physical Activity: Not on file  Stress: Not on file  Social Connections: Not on file  Intimate Partner Violence: Not on file    ROS Review of Systems  Constitutional: Negative.   Respiratory: Negative.    Cardiovascular: Negative.   Musculoskeletal:  Positive for arthralgias.       Left knee pain; had recent surgery with Dr.  Harrison  Psychiatric/Behavioral: Negative.     Objective:   Today's Vitals: BP (!) 148/79 (BP Location: Right Arm, Patient Position: Sitting, Cuff Size: Large)   Pulse 80   Temp 97.7 F (36.5 C) (Temporal)   Ht _0  (1.956 m)   Wt (!) 384 lb (174.2 kg)   SpO2 94%   BMI 45.54 kg/m   Physical Exam Constitutional:      Appearance: Normal appearance. He is obese.  Cardiovascular:     Rate and Rhythm: Normal rate and regular rhythm.     Pulses: Normal pulses.     Heart sounds: Normal heart sounds.  Pulmonary:     Effort: Pulmonary effort is normal.     Breath sounds: Normal breath sounds.  Neurological:     Mental Status: He is alert.  Psychiatric:        Mood and Affect: Mood normal.        Behavior: Behavior normal.        Thought Content: Thought content normal.        Judgment: Judgment normal.    Assessment & Plan:    Problem List Items Addressed This Visit       Cardiovascular and Mediastinum   HTN (hypertension) (Chronic)    BP Readings from Last 3 Encounters:  06/20/21 (!) 148/79  05/18/21 (!) 148/88  05/17/21 132/72  -last 2 readings have been elevated; had recent left knee arthroscopy        Relevant Orders   CBC with Differential/Platelet   CMP14+EGFR   Lipid Panel With LDL/HDL Ratio     Other   Morbid obesity (HCC)    Wt Readings from Last 10 Encounters:  06/20/21 (!) 384 lb (174.2 kg)  05/18/21 (!) 360 lb 0.2 oz (163.3 kg)  05/12/21 (!) 360 lb (163.3 kg)  05/04/21 (!) 385 lb (174.6 kg)  04/26/21 (!) 386 lb 9.6 oz (175.4 kg)  04/12/21 (!) 385 lb (174.6 kg)  04/05/21 (!) 386 lb (175.1 kg)  03/08/21 (!) 382 lb (173.3 kg)  02/22/21 (!) 383 lb (173.7 kg)  02/09/21 (!) 388 lb 3.2 oz (176.1 kg)  -weight stable over the last 3 months -Rx. Saxenda; expect PA; if unable to obtain this will consider Ozempic vs medical weight management       Relevant Medications   Liraglutide -Weight Management (SAXENDA) 18 MG/3ML SOPN   Other Relevant Orders   TSH   Prediabetes    Lab Results  Component Value Date   HGBA1C 6.3 (H) 05/12/2021  -Rx. Saxenda      Relevant Medications   Liraglutide -Weight Management (SAXENDA) 18 MG/3ML SOPN   Other Relevant Orders   CMP14+EGFR   Lipid Panel With LDL/HDL Ratio   Other Visit Diagnoses     Encounter to establish care    -  Primary   Relevant Orders   HIV antibody (with reflex)   Hepatitis C Antibody   CBC with Differential/Platelet   CMP14+EGFR   Lipid Panel With LDL/HDL Ratio   TSH   Screening due       Relevant Orders   HIV antibody (with reflex)   Hepatitis C Antibody       Outpatient Encounter Medications as of 06/20/2021  Medication Sig   allopurinol (ZYLOPRIM) 300 MG tablet Take 1 tablet (300 mg total) by mouth daily.   chlorthalidone (HYGROTON) 25 MG tablet TAKE 1 TABLET(25 MG) BY MOUTH DAILY   enalapril (VASOTEC) 20 MG  tablet TAKE 1 TABLET(20 MG) BY MOUTH  DAILY   HYDROcodone-acetaminophen (NORCO) 10-325 MG tablet Take 1 tablet by mouth every 4 (four) hours as needed.   Liraglutide -Weight Management (SAXENDA) 18 MG/3ML SOPN Inject 0.6 mg into the skin daily for 7 days, THEN 1.2 mg daily for 7 days, THEN 1.8 mg daily for 7 days, THEN 2.4 mg daily for 7 days, THEN 3 mg daily for 7 days.   methocarbamol (ROBAXIN) 500 MG tablet Take 1 tablet (500 mg total) by mouth 3 (three) times daily.   metoprolol tartrate (LOPRESSOR) 25 MG tablet Take 0.5 tablets (12.5 mg total) by mouth 2 (two) times daily. TAKE 1/2 TABLET(12.5 MG) BY MOUTH TWICE DAILY (Patient taking differently: Take 25 mg by mouth at bedtime. TAKE 1/2 TABLET(12.5 MG) BY MOUTH TWICE DAILY)   tiZANidine (ZANAFLEX) 4 MG tablet Take 1 tablet (4 mg total) by mouth every 6 (six) hours as needed for muscle spasms.   [DISCONTINUED] albuterol (VENTOLIN HFA) 108 (90 Base) MCG/ACT inhaler Inhale 2 puffs into the lungs every 4 (four) hours as needed for wheezing or shortness of breath. (Patient not taking: Reported on 06/20/2021)   No facility-administered encounter medications on file as of 06/20/2021.    Follow-up: Return in about 1 month (around 07/21/2021) for Med check (saxenda).   Noreene Larsson, NP

## 2021-06-21 LAB — CBC WITH DIFFERENTIAL/PLATELET
Basophils Absolute: 0 10*3/uL (ref 0.0–0.2)
Basos: 1 %
EOS (ABSOLUTE): 0.4 10*3/uL (ref 0.0–0.4)
Eos: 5 %
Hematocrit: 44.8 % (ref 37.5–51.0)
Hemoglobin: 14.6 g/dL (ref 13.0–17.7)
Immature Grans (Abs): 0 10*3/uL (ref 0.0–0.1)
Immature Granulocytes: 0 %
Lymphocytes Absolute: 2.4 10*3/uL (ref 0.7–3.1)
Lymphs: 31 %
MCH: 26.4 pg — ABNORMAL LOW (ref 26.6–33.0)
MCHC: 32.6 g/dL (ref 31.5–35.7)
MCV: 81 fL (ref 79–97)
Monocytes Absolute: 0.5 10*3/uL (ref 0.1–0.9)
Monocytes: 7 %
Neutrophils Absolute: 4.4 10*3/uL (ref 1.4–7.0)
Neutrophils: 56 %
Platelets: 207 10*3/uL (ref 150–450)
RBC: 5.52 x10E6/uL (ref 4.14–5.80)
RDW: 13.7 % (ref 11.6–15.4)
WBC: 7.8 10*3/uL (ref 3.4–10.8)

## 2021-06-21 LAB — LIPID PANEL WITH LDL/HDL RATIO
Cholesterol, Total: 196 mg/dL (ref 100–199)
HDL: 41 mg/dL (ref >39)
LDL Chol Calc (NIH): 133 mg/dL — ABNORMAL HIGH (ref 0–99)
LDL/HDL Ratio: 3.2 ratio (ref 0.0–3.6)
Triglycerides: 122 mg/dL (ref 0–149)
VLDL Cholesterol Cal: 22 mg/dL (ref 5–40)

## 2021-06-21 LAB — CMP14+EGFR
ALT: 18 IU/L (ref 0–44)
AST: 24 IU/L (ref 0–40)
Albumin/Globulin Ratio: 1.6 (ref 1.2–2.2)
Albumin: 4.5 g/dL (ref 3.8–4.9)
Alkaline Phosphatase: 60 IU/L (ref 44–121)
BUN/Creatinine Ratio: 17 (ref 9–20)
BUN: 16 mg/dL (ref 6–24)
Bilirubin Total: 0.3 mg/dL (ref 0.0–1.2)
CO2: 20 mmol/L (ref 20–29)
Calcium: 9.5 mg/dL (ref 8.7–10.2)
Chloride: 103 mmol/L (ref 96–106)
Creatinine, Ser: 0.94 mg/dL (ref 0.76–1.27)
Globulin, Total: 2.9 g/dL (ref 1.5–4.5)
Glucose: 116 mg/dL — ABNORMAL HIGH (ref 65–99)
Potassium: 5 mmol/L (ref 3.5–5.2)
Sodium: 140 mmol/L (ref 134–144)
Total Protein: 7.4 g/dL (ref 6.0–8.5)
eGFR: 98 mL/min/{1.73_m2} (ref >59)

## 2021-06-21 LAB — HIV ANTIBODY (ROUTINE TESTING W REFLEX): HIV Screen 4th Generation wRfx: NONREACTIVE

## 2021-06-21 LAB — HEPATITIS C ANTIBODY: Hep C Virus Ab: 0.2 s/co ratio (ref 0.0–0.9)

## 2021-06-21 LAB — TSH: TSH: 1.67 u[IU]/mL (ref 0.450–4.500)

## 2021-06-21 NOTE — Progress Notes (Signed)
Cholesterol is elevated, so try cutting back on fried/fatty food. Glucose was >100, so I added an A1c to check for diabetes.

## 2021-06-22 ENCOUNTER — Encounter: Payer: Self-pay | Admitting: Orthopedic Surgery

## 2021-06-22 ENCOUNTER — Ambulatory Visit (INDEPENDENT_AMBULATORY_CARE_PROVIDER_SITE_OTHER): Payer: BC Managed Care – PPO | Admitting: Orthopedic Surgery

## 2021-06-22 ENCOUNTER — Other Ambulatory Visit: Payer: Self-pay

## 2021-06-22 ENCOUNTER — Telehealth: Payer: Self-pay | Admitting: Orthopedic Surgery

## 2021-06-22 DIAGNOSIS — Z9889 Other specified postprocedural states: Secondary | ICD-10-CM

## 2021-06-22 DIAGNOSIS — M1712 Unilateral primary osteoarthritis, left knee: Secondary | ICD-10-CM | POA: Diagnosis not present

## 2021-06-22 DIAGNOSIS — M238X2 Other internal derangements of left knee: Secondary | ICD-10-CM

## 2021-06-22 DIAGNOSIS — M23321 Other meniscus derangements, posterior horn of medial meniscus, right knee: Secondary | ICD-10-CM

## 2021-06-22 NOTE — Telephone Encounter (Signed)
Patient called left message he forgot to ask if he is suppose to still take 2 Asprin a day.   Please call him back and advise,

## 2021-06-22 NOTE — Patient Instructions (Signed)
ICE THE KNEE 6 X A DAY FOR 30 MIN   CONTINUE HOME EXERCISES   START OUTPATIENT THERAPY

## 2021-06-22 NOTE — Progress Notes (Signed)
POST OP   Chief Complaint  Patient presents with   Post-op Follow-up    Left knee 05/17/21 improving but still using walker has soreness back of knee with exercises    Encounter Diagnoses  Name Primary?   S/P arthroscopy of left knee 05/17/21 Yes   Derangement of posterior horn of medial meniscus of right knee    Chondral defect of condyle of left femur    POV # 3  POD 36    PROCEDURE: Procedure arthroscopy left knee microfracture medial femoral condyle partial medial meniscectomy   Surgeon [name]   Operative findings   MEDIAL chondral defect medial femoral condyle 10 x 7 full-thickness defect, medial meniscus tear radial tear posterior horn   Meds related: NORCO 10   James Rivas has regained his range of motion although he still has fluid in the joint expected from the microfracture  He has lack of confidence in his knee  I recommend physical therapy continue his hydrocodone 10 mg on an as-needed basis as he is basically weaned himself off of that  He will start therapy see me in 5 to 6 weeks  He was placed in a knee brace

## 2021-06-23 ENCOUNTER — Ambulatory Visit (HOSPITAL_COMMUNITY): Payer: BC Managed Care – PPO | Attending: Orthopedic Surgery | Admitting: Physical Therapy

## 2021-06-23 ENCOUNTER — Other Ambulatory Visit: Payer: Self-pay | Admitting: Nurse Practitioner

## 2021-06-23 ENCOUNTER — Encounter (HOSPITAL_COMMUNITY): Payer: Self-pay | Admitting: Physical Therapy

## 2021-06-23 DIAGNOSIS — M25662 Stiffness of left knee, not elsewhere classified: Secondary | ICD-10-CM | POA: Diagnosis not present

## 2021-06-23 DIAGNOSIS — M25562 Pain in left knee: Secondary | ICD-10-CM | POA: Insufficient documentation

## 2021-06-23 DIAGNOSIS — G8929 Other chronic pain: Secondary | ICD-10-CM | POA: Diagnosis not present

## 2021-06-23 DIAGNOSIS — R262 Difficulty in walking, not elsewhere classified: Secondary | ICD-10-CM | POA: Diagnosis not present

## 2021-06-23 DIAGNOSIS — M6281 Muscle weakness (generalized): Secondary | ICD-10-CM | POA: Diagnosis not present

## 2021-06-23 LAB — SPECIMEN STATUS REPORT

## 2021-06-23 LAB — HEMOGLOBIN A1C
Est. average glucose Bld gHb Est-mCnc: 140 mg/dL
Hgb A1c MFr Bld: 6.5 % — ABNORMAL HIGH (ref 4.8–5.6)

## 2021-06-23 MED ORDER — BLOOD GLUCOSE METER KIT: PACK | 0 refills | Status: DC

## 2021-06-23 NOTE — Therapy (Signed)
[facility] [address] [place], [place], [address] Phone: [phone]   Fax:  [phone]  Physical Therapy Evaluation  Patient Details  Name: James Rivas MRN: [id number] Date of Birth: 1971-11-03 Referring Provider (PT): Fuller Canada  Encounter Date: 06/23/2021   PT End of Session - 06/23/21 0900     Visit Number 1    Number of Visits 12    Date for PT Re-Evaluation 08/04/21    Authorization Type BCBS COMM PPO no auth or VL    Progress Note Due on Visit 10    PT Start Time 0915    PT Stop Time 0945    PT Time Calculation (min) 30 min    Activity Tolerance Patient tolerated treatment well    Behavior During Therapy [facility] for tasks assessed/performed             Past Medical History:  Diagnosis Date   Allergy    Arthritis    left knee   Chest pain    Gout    Gout    HTN (hypertension)    Impaired fasting glucose    Morbid obesity (HCC)    Sleep apnea    uses a CPAP   Venous stasis     Past Surgical History:  Procedure Laterality Date   COLONOSCOPY WITH PROPOFOL N/A 05/11/2020   Procedure: COLONOSCOPY WITH PROPOFOL;  Surgeon: Corbin Ade, MD;  [facility];  Service: Endoscopy;  Laterality: N/A;  2:15pm   FINGER SURGERY Left    left ring finger 2 surgeries   I & D EXTREMITY  11/29/2011   Procedure: IRRIGATION AND DEBRIDEMENT EXTREMITY;  Surgeon: Sharma Covert;  [facility]: [facility];  Service: Orthopedics;  Laterality: Left;  I&D Right Long and Index Fingers with Tendon Repair as Needed   KNEE ARTHROSCOPY WITH MEDIAL MENISECTOMY Left 05/17/2021   Procedure: KNEE ARTHROSCOPY WITH MICROFRACTURE;  Surgeon: Vickki Hearing, MD;  Location: [facility];  Service: Orthopedics;  Laterality: Left;   MENISECTOMY Left 05/17/2021   Procedure: PARTIAL MEDIAL MENISECTOMY;  Surgeon: Vickki Hearing, MD;  Location: [facility];  Service: Orthopedics;  Laterality: Left;    There were no vitals filed for this visit.     Subjective Assessment - 06/23/21 0922     Subjective States that he had knee surgery on 05/17/21. States that he has not had a major surgery and he didn't think the surgery was going to be like it was. States that is it achy at night and reports pain is 7/10 and he takes pain medications but he hates to take them. States that he is elevating and using ice as needed. Reports current pain is 3/10 and described as achy and throbbing. Prior to the surgery he was having pain in his knee since march. States that he was delayed in surgery because insurance wouldn't approve  an MRI. States he walks a lot for his job and works 12 hour shifts. States that he did not have PT prior to his surgery. States he just got his knee brace yesterday as he is trying to wean away from his walking (no walker presented during today's session). Reports no MOI but that his knee just started progressively hurting.    Pertinent History gout, arthritis    Limitations Standing;Walking;Lifting;House hold activities    Patient Stated Goals to get his knee 85% of what it was    Currently in Pain? Yes    Pain Score 3  Pain Location Knee    Pain Orientation Left    Pain Descriptors / Indicators Aching    Pain Radiating Towards posterior knee    Aggravating Factors  standing, walking, bending    Pain Relieving Factors ice, rest elevation                [facility] PT Assessment - 06/23/21 0001       Assessment   Medical Diagnosis left knee scope with medial menisectomy and medial microfracture    Referring Provider (PT) Fuller Canada    Onset Date/Surgical Date 05/17/21    Next MD Visit 08/03/21    Prior Therapy yes for hand      Balance Screen   Has the patient fallen in the past 6 months No      Home Environment   Living Environment Private residence    Living Arrangements Spouse/significant other    Available Help at Discharge Family    Type of Home House    Home Access Stairs to enter    Entrance Stairs-Number  of Steps 2    Home Layout One level      Prior Function   Level of Independence Independent    Vocation Full time employment      Cognition   Overall Cognitive Status Within Functional Limits for tasks assessed      Observation/Other Assessments   Observations swelling thoruhgout left knee, portal sites scabbed over    Focus on Therapeutic Outcomes (FOTO)  28% function      Observation/Other Assessments-Edema    Edema Circumferential      Circumferential Edema   Circumferential - Right 43.25 cm   at joint line   Circumferential - Left  45.75cm   at joint line     ROM / Strength   AROM / PROM / Strength AROM;Strength      AROM   AROM Assessment Site Knee;Ankle    Right/Left Knee Right;Left    Right Knee Extension 0    Right Knee Flexion 122    Left Knee Extension 7   lacking   Left Knee Flexion 105      Strength   Overall Strength Comments able to perform 5 SLR on the left with < 5 degrees of extension lag but fatigue noted    Strength Assessment Site Knee    Right/Left Knee Right;Left      Palpation   Patella mobility hypomobility in all directions on left patella    Palpation comment tenderness to palpation to posterior left knee                        Objective measurements completed on examination: See above findings.       [facility] Adult PT Treatment/Exercise - 06/23/21 0001       Exercises   Exercises Knee/Hip      Knee/Hip Exercises: Supine   Quad Sets Right;Strengthening;15 reps    Heel Slides Right;15 reps    Straight Leg Raises Right;10 reps   with quad set and DF                   PT Education - 06/23/21 0947     Education Details on current presentation, on FOTO score, HEP and POC, on elevation above heart    Person(s) Educated Patient    Methods Explanation    Comprehension Verbalized understanding              PT Short  Term Goals - 06/23/21 0933       PT SHORT TERM GOAL #1   Title Patient will demonstrate  at least 2-120 degrees of left knee ROM    Time 3    Period Weeks    Status New    Target Date 07/14/21      PT SHORT TERM GOAL #2   Title Patient will report at least 50% improvement in overall symptoms and/or function to demonstrate improved functional mobility    Time 3    Period Weeks    Status New    Target Date 07/14/21      PT SHORT TERM GOAL #3   Title Patient will be independent in self management strategies to improve quality of life and functional outcomes.    Time 3    Period Weeks    Status New    Target Date 07/14/21               PT Long Term Goals - 06/23/21 0935       PT LONG TERM GOAL #1   Title Patient will improve on FOTO score to meet predicted outcomes to demonstrate improved functional mobility.    Time 6    Period Weeks    Status New    Target Date 08/04/21      PT LONG TERM GOAL #2   Title Patietn will be able to walk without brace or antalgic gait for at least 5 minutes to demonstrate improved functional knee strength.    Time 6    Period Weeks    Status New    Target Date 08/04/21      PT LONG TERM GOAL #3   Title Patient will report at least 75% improvement in overall symptoms and/or function to demonstrate improved functional mobility    Time 6    Period Weeks    Status New    Target Date 08/04/21      PT LONG TERM GOAL #4   Title Patient will be able to go up and down the stairs with use of one railing as needed and reciprocal gait pattern to improve overall stair mobility    Time 6    Period Weeks    Status New    Target Date 08/04/21                    Plan - 06/23/21 0949     Clinical Impression Statement Patient is a 50 y.o. male who presents to physical therapy with complaint of left knee pain that started in March of this year with no known MOI. Patient underwent left knee scope on 05/17/21 and has not been able to work secondary to pain. Patient demonstrates decreased strength, ROM restriction, balance  deficits and gait abnormalities which are likely contributing to symptoms of pain and are negatively impacting patient ability to perform ADLs and functional mobility tasks. Patient will benefit from skilled physical therapy services to address these deficits to reduce pain, improve level of function with ADLs, functional mobility tasks, and reduce risk for falls.    Personal Factors and Comorbidities Comorbidity 1;Comorbidity 2;Fitness;Profession    Comorbidities gout, overweigh, arthritis    Examination-Activity Limitations Squat;Stairs;Stand;Transfers;Locomotion Level;Lift    Examination-Participation Restrictions Occupation;Meal Prep;Laundry;Yard Work;Community Activity;Cleaning;Shop    Stability/Clinical Decision Making Stable/Uncomplicated    Clinical Decision Making Low    Rehab Potential Good    PT Frequency 2x / week    PT Duration 6 weeks  PT Treatment/Interventions ADLs/Self Care Home Management;Aquatic Therapy;Cryotherapy;Electrical Stimulation;Moist Heat;Balance training;Therapeutic exercise;Manual techniques;Therapeutic activities;Stair training;Gait training;DME Instruction;Neuromuscular re-education;Patient/family education;Passive range of motion;Dry needling;Joint Manipulations    PT Next Visit Plan edema management, quad/LE strengthening and ROM - bike    PT Home Exercise Plan heel slides, Quad set, SLR    Consulted and Agree with Plan of Care Patient             Patient will benefit from skilled therapeutic intervention in order to improve the following deficits and impairments:  Pain, Decreased strength, Decreased activity tolerance, Difficulty walking, Decreased range of motion, Decreased balance, Abnormal gait, Decreased endurance, Decreased skin integrity, Decreased scar mobility  Visit Diagnosis: Chronic pain of left knee  Difficulty in walking, not elsewhere classified  Muscle weakness (generalized)  Decreased range of motion (ROM) of left  knee     Problem List Patient Active Problem List   Diagnosis Date Noted   Type 2 diabetes mellitus with morbid obesity (HCC) 06/23/2021   Gout 06/20/2021   Prediabetes 06/20/2021   S/P arthroscopy of left knee 05/17/21 05/25/2021   Chondral defect of condyle of left femur    Acute medial meniscus tear of left knee    History of COVID-19 12/20/2020   Cough in adult patient 11/18/2020   Colon cancer screening 03/22/2020   Obstructive sleep apnea 01/05/2020   Morbid obesity (HCC) 10/15/2016   Allergic rhinitis 01/06/2015   Prostate hypertrophy 01/06/2015   Venous stasis 03/23/2014   Venous stasis dermatitis 03/23/2014   Chronic chest pain 06/22/2013   Dyspnea 05/18/2013   Chest pain    HTN (hypertension)    9:53 AM, 06/23/21 Tereasa Coop, DPT Physical Therapy with [facility]  [phone] office   [facility] [address] [place], [place], [address] Phone: [phone]   Fax:  [phone]  Name: ODA LANSDOWNE MRN: [id number] Date of Birth: January 30, 1971

## 2021-06-23 NOTE — Progress Notes (Signed)
A1c is 6.5 which is at the low end of the diabetic range. I sent in a prescription for a glucose meter, strips, and lancets to check his blood sugar daily. Was he able to get the saxenda?

## 2021-06-23 NOTE — Telephone Encounter (Signed)
I called him about the ASA he can stop now, he voiced understanding  He asked about the forms / have they been sent?? I told him I will ask you

## 2021-06-23 NOTE — Assessment & Plan Note (Signed)
Lab Results  Component Value Date   HGBA1C 6.5 (H) 06/20/2021  -sent in Sax[facility] at last OV -Rx. DM supplies

## 2021-06-23 NOTE — Telephone Encounter (Signed)
I called patient back regarding forms, left message.

## 2021-06-24 ENCOUNTER — Telehealth: Payer: Self-pay

## 2021-06-24 NOTE — Telephone Encounter (Signed)
Patient called said medicine was never sent into the pharmacy  [facility] on [address] [place]

## 2021-06-24 NOTE — Telephone Encounter (Signed)
This needs a PA. I have form on my desk, will complete today. Pt informed.

## 2021-06-27 ENCOUNTER — Other Ambulatory Visit: Payer: Self-pay

## 2021-06-27 ENCOUNTER — Ambulatory Visit (HOSPITAL_COMMUNITY): Payer: BC Managed Care – PPO | Attending: Orthopedic Surgery | Admitting: Physical Therapy

## 2021-06-27 ENCOUNTER — Encounter (HOSPITAL_COMMUNITY): Payer: Self-pay | Admitting: Physical Therapy

## 2021-06-27 DIAGNOSIS — G8929 Other chronic pain: Secondary | ICD-10-CM | POA: Diagnosis not present

## 2021-06-27 DIAGNOSIS — M25662 Stiffness of left knee, not elsewhere classified: Secondary | ICD-10-CM | POA: Insufficient documentation

## 2021-06-27 DIAGNOSIS — R262 Difficulty in walking, not elsewhere classified: Secondary | ICD-10-CM | POA: Insufficient documentation

## 2021-06-27 DIAGNOSIS — M6281 Muscle weakness (generalized): Secondary | ICD-10-CM | POA: Diagnosis not present

## 2021-06-27 DIAGNOSIS — M25562 Pain in left knee: Secondary | ICD-10-CM | POA: Diagnosis not present

## 2021-06-27 NOTE — Therapy (Signed)
[facility] [address] [place], [place], [address] Phone: [phone]   Fax:  [phone]  Physical Therapy Treatment  Patient Details  Name: James Rivas MRN: [id number] Date of Birth: Nov 18, 1971 Referring Provider (PT): Fuller Canada   Encounter Date: 06/27/2021   PT End of Session - 06/27/21 1613     Visit Number 2    Number of Visits 12    Date for PT Re-Evaluation 08/04/21    Authorization Type BCBS COMM PPO no auth or VL    Progress Note Due on Visit 10    PT Start Time 1615    PT Stop Time 1657    PT Time Calculation (min) 42 min    Activity Tolerance Patient tolerated treatment well    Behavior During Therapy [facility] for tasks assessed/performed             Past Medical History:  Diagnosis Date   Allergy    Arthritis    left knee   Chest pain    Gout    Gout    HTN (hypertension)    Impaired fasting glucose    Morbid obesity (HCC)    Sleep apnea    uses a CPAP   Venous stasis     Past Surgical History:  Procedure Laterality Date   COLONOSCOPY WITH PROPOFOL N/A 05/11/2020   Procedure: COLONOSCOPY WITH PROPOFOL;  Surgeon: Corbin Ade, MD;  [facility];  Service: Endoscopy;  Laterality: N/A;  2:15pm   FINGER SURGERY Left    left ring finger 2 surgeries   I & D EXTREMITY  11/29/2011   Procedure: IRRIGATION AND DEBRIDEMENT EXTREMITY;  Surgeon: Sharma Covert;  Location: [facility] OR;  Service: Orthopedics;  Laterality: Left;  I&D Right Long and Index Fingers with Tendon Repair as Needed   KNEE ARTHROSCOPY WITH MEDIAL MENISECTOMY Left 05/17/2021   Procedure: KNEE ARTHROSCOPY WITH MICROFRACTURE;  Surgeon: Vickki Hearing, MD;  Location: [facility] ORS;  Service: Orthopedics;  Laterality: Left;   MENISECTOMY Left 05/17/2021   Procedure: PARTIAL MEDIAL MENISECTOMY;  Surgeon: Vickki Hearing, MD;  Location: [facility];  Service: Orthopedics;  Laterality: Left;    There were no vitals filed for this visit.    Subjective Assessment - 06/27/21 1619     Subjective Reports no pain or difficulties with his knee since last session.    Pertinent History gout, arthritis    Limitations Standing;Walking;Lifting;House hold activities    Patient Stated Goals to get his knee 85% of what it was                [facility] PT Assessment - 06/27/21 0001       Assessment   Medical Diagnosis left knee scope with medial menisectomy and medial microfracture    Referring Provider (PT) Fuller Canada    Onset Date/Surgical Date 05/17/21    Next MD Visit 08/03/21                           [facility] Adult PT Treatment/Exercise - 06/27/21 0001       Knee/Hip Exercises: Aerobic   Nustep 4 minutes no resistance - all the way back for ROM      Knee/Hip Exercises: Supine   Short Arc Quad Sets AROM;Left;10 reps;3 sets   2#, blue bolster, 5" holds   Heel Slides Right;15 reps   20 seconds   Bridges 3 sets   8 reps,  2" holds     Knee/Hip Exercises: Prone   Hamstring Curl 4 sets;5 reps;2 seconds   both legs   Hip Extension 4 sets;5 reps;Both;Strengthening   2" holds                     PT Short Term Goals - 06/23/21 0933       PT SHORT TERM GOAL #1   Title Patient will demonstrate at least 2-120 degrees of left knee ROM    Time 3    Period Weeks    Status New    Target Date 07/14/21      PT SHORT TERM GOAL #2   Title Patient will report at least 50% improvement in overall symptoms and/or function to demonstrate improved functional mobility    Time 3    Period Weeks    Status New    Target Date 07/14/21      PT SHORT TERM GOAL #3   Title Patient will be independent in self management strategies to improve quality of life and functional outcomes.    Time 3    Period Weeks    Status New    Target Date 07/14/21               PT Long Term Goals - 06/23/21 0935       PT LONG TERM GOAL #1   Title Patient will improve on FOTO score to meet predicted outcomes to  demonstrate improved functional mobility.    Time 6    Period Weeks    Status New    Target Date 08/04/21      PT LONG TERM GOAL #2   Title Patietn will be able to walk without brace or antalgic gait for at least 5 minutes to demonstrate improved functional knee strength.    Time 6    Period Weeks    Status New    Target Date 08/04/21      PT LONG TERM GOAL #3   Title Patient will report at least 75% improvement in overall symptoms and/or function to demonstrate improved functional mobility    Time 6    Period Weeks    Status New    Target Date 08/04/21      PT LONG TERM GOAL #4   Title Patient will be able to go up and down the stairs with use of one railing as needed and reciprocal gait pattern to improve overall stair mobility    Time 6    Period Weeks    Status New    Target Date 08/04/21                   Plan - 06/27/21 1640     Clinical Impression Statement Able to progress exercises. Overall knee ROM improved to 0-111 degrees on this date and swelling better than last session. Added all new exercises to HEP. No complaints of pain during session. Fatigue noted end of session. Wil continue with current POC as tolerated.    Personal Factors and Comorbidities Comorbidity 1;Comorbidity 2;Fitness;Profession    Comorbidities gout, overweigh, arthritis    Examination-Activity Limitations Squat;Stairs;Stand;Transfers;Locomotion Level;Lift    Examination-Participation Restrictions Occupation;Meal Prep;Laundry;Yard Work;Community Activity;Cleaning;Shop    Stability/Clinical Decision Making Stable/Uncomplicated    Rehab Potential Good    PT Frequency 2x / week    PT Duration 6 weeks    PT Treatment/Interventions ADLs/Self Care Home Management;Aquatic Therapy;Cryotherapy;Electrical Stimulation;Moist Heat;Balance training;Therapeutic exercise;Manual techniques;Therapeutic activities;Stair training;Gait training;DME Instruction;Neuromuscular re-education;Patient/family  education;Passive range of motion;Dry needling;Joint Manipulations    PT Next Visit Plan edema management, quad/LE strengthening and ROM - bike    PT Home Exercise Plan heel slides, Quad set, SLR; 8/1 ham curls, hip ext, bridge, SAQs, prone quad set    Consulted and Agree with Plan of Care Patient             Patient will benefit from skilled therapeutic intervention in order to improve the following deficits and impairments:  Pain, Decreased strength, Decreased activity tolerance, Difficulty walking, Decreased range of motion, Decreased balance, Abnormal gait, Decreased endurance, Decreased skin integrity, Decreased scar mobility  Visit Diagnosis: Chronic pain of left knee  Difficulty in walking, not elsewhere classified  Muscle weakness (generalized)  Decreased range of motion (ROM) of left knee     Problem List Patient Active Problem List   Diagnosis Date Noted   Type 2 diabetes mellitus with morbid obesity (HCC) 06/23/2021   Gout 06/20/2021   Prediabetes 06/20/2021   S/P arthroscopy of left knee 05/17/21 05/25/2021   Chondral defect of condyle of left femur    Acute medial meniscus tear of left knee    History of COVID-19 12/20/2020   Cough in adult patient 11/18/2020   Colon cancer screening 03/22/2020   Obstructive sleep apnea 01/05/2020   Morbid obesity (HCC) 10/15/2016   Allergic rhinitis 01/06/2015   Prostate hypertrophy 01/06/2015   Venous stasis 03/23/2014   Venous stasis dermatitis 03/23/2014   Chronic chest pain 06/22/2013   Dyspnea 05/18/2013   Chest pain    HTN (hypertension)    5:04 PM, 06/27/21 Tereasa Coop, DPT Physical Therapy with [facility]  [phone] office   [facility] [address] [place], [place], [address] Phone: [phone]   Fax:  [phone]  Name: James Rivas MRN: [id number] Date of Birth: October 28, 1971

## 2021-06-29 ENCOUNTER — Ambulatory Visit (HOSPITAL_COMMUNITY): Payer: BC Managed Care – PPO

## 2021-06-29 ENCOUNTER — Encounter (HOSPITAL_COMMUNITY): Payer: Self-pay

## 2021-06-29 ENCOUNTER — Other Ambulatory Visit: Payer: Self-pay

## 2021-06-29 DIAGNOSIS — M25662 Stiffness of left knee, not elsewhere classified: Secondary | ICD-10-CM | POA: Diagnosis not present

## 2021-06-29 DIAGNOSIS — R262 Difficulty in walking, not elsewhere classified: Secondary | ICD-10-CM

## 2021-06-29 DIAGNOSIS — G8929 Other chronic pain: Secondary | ICD-10-CM | POA: Diagnosis not present

## 2021-06-29 DIAGNOSIS — M25562 Pain in left knee: Secondary | ICD-10-CM | POA: Diagnosis not present

## 2021-06-29 DIAGNOSIS — M6281 Muscle weakness (generalized): Secondary | ICD-10-CM | POA: Diagnosis not present

## 2021-06-29 NOTE — Telephone Encounter (Signed)
Patient aware done 

## 2021-06-29 NOTE — Therapy (Signed)
[facility] [address] [place], [place], [address] Phone: [phone]   Fax:  [phone]  Physical Therapy Treatment  Patient Details  Name: James Rivas MRN: [id number] Date of Birth: [birth year] Referring Provider (PT): Fuller Canada   Encounter Date: 06/29/2021   PT End of Session - 06/29/21 1751     Visit Number 3    Number of Visits 12    Date for PT Re-Evaluation 08/04/21    Authorization Type BCBS COMM PPO no auth or VL    Progress Note Due on Visit 10    PT Start Time 1747    PT Stop Time 1825    PT Time Calculation (min) 38 min    Activity Tolerance Patient tolerated treatment well    Behavior During Therapy [facility] for tasks assessed/performed             Past Medical History:  Diagnosis Date   Allergy    Arthritis    left knee   Chest pain    Gout    Gout    HTN (hypertension)    Impaired fasting glucose    Morbid obesity (HCC)    Sleep apnea    uses a CPAP   Venous stasis     Past Surgical History:  Procedure Laterality Date   COLONOSCOPY WITH PROPOFOL N/A 05/11/2020   Procedure: COLONOSCOPY WITH PROPOFOL;  Surgeon: Corbin Ade, MD;  [facility];  Service: Endoscopy;  Laterality: N/A;  2:15pm   FINGER SURGERY Left    left ring finger 2 surgeries   I & D EXTREMITY  11/29/2011   Procedure: IRRIGATION AND DEBRIDEMENT EXTREMITY;  Surgeon: Sharma Covert;  Location: [facility] OR;  Service: Orthopedics;  Laterality: Left;  I&D Right Long and Index Fingers with Tendon Repair as Needed   KNEE ARTHROSCOPY WITH MEDIAL MENISECTOMY Left 05/17/2021   Procedure: KNEE ARTHROSCOPY WITH MICROFRACTURE;  Surgeon: Vickki Hearing, MD;  Location: [facility];  Service: Orthopedics;  Laterality: Left;   MENISECTOMY Left 05/17/2021   Procedure: PARTIAL MEDIAL MENISECTOMY;  Surgeon: Vickki Hearing, MD;  Location: [facility];  Service: Orthopedics;  Laterality: Left;    There were no vitals filed for this visit.    Subjective Assessment - 06/29/21 1750     Subjective No reports of pain or swelling.  Main compliant with tightness and some weakness.    Pertinent History gout, arthritis    Patient Stated Goals to get his knee 85% of what it was    Currently in Pain? No/denies                [facility] PT Assessment - 06/29/21 0001       Assessment   Medical Diagnosis left knee scope with medial menisectomy and medial microfracture    Referring Provider (PT) Fuller Canada    Onset Date/Surgical Date 05/17/21    Next MD Visit 08/03/21                           [facility] Adult PT Treatment/Exercise - 06/29/21 0001       Exercises   Exercises Knee/Hip      Knee/Hip Exercises: Stretches   Quad Stretch Left;3 reps;30 seconds    Quad Stretch Limitations prone with rope    Knee: Self-Stretch to increase Flexion 5 reps;10 seconds    Knee: Self-Stretch Limitations 12in step height knee drive for flexion  Knee/Hip Exercises: Aerobic   Recumbent Bike 4 min no resistance, full revolution seat 23      Knee/Hip Exercises: Standing   Heel Raises 10 reps    Knee Flexion Left;10 reps;2 sets    Knee Flexion Limitations 3 then 5#    Terminal Knee Extension 10 reps    Terminal Knee Extension Limitations pink ball against wall    Lateral Step Up Left;10 reps;Hand Hold: 2;Step Height: 4"    Forward Step Up Left;10 reps;Hand Hold: 1;Hand Hold: 0;Step Height: 4"      Knee/Hip Exercises: Seated   Long Arc Quad 10 reps    Long Arc Quad Weight 3 lbs.    Sit to [employer] --   next session     Knee/Hip Exercises: Supine   Quad Sets Left;10 reps    Heel Slides Left;10 reps    Knee Extension AROM    Knee Extension Limitations 0    Knee Flexion AROM    Knee Flexion Limitations 116      Knee/Hip Exercises: Prone   Hip Extension 2 sets;10 reps                      PT Short Term Goals - 06/23/21 0933       PT SHORT TERM GOAL #1   Title Patient will demonstrate at least 2-120  degrees of left knee ROM    Time 3    Period Weeks    Status New    Target Date 07/14/21      PT SHORT TERM GOAL #2   Title Patient will report at least 50% improvement in overall symptoms and/or function to demonstrate improved functional mobility    Time 3    Period Weeks    Status New    Target Date 07/14/21      PT SHORT TERM GOAL #3   Title Patient will be independent in self management strategies to improve quality of life and functional outcomes.    Time 3    Period Weeks    Status New    Target Date 07/14/21               PT Long Term Goals - 06/23/21 0935       PT LONG TERM GOAL #1   Title Patient will improve on FOTO score to meet predicted outcomes to demonstrate improved functional mobility.    Time 6    Period Weeks    Status New    Target Date 08/04/21      PT LONG TERM GOAL #2   Title Patietn will be able to walk without brace or antalgic gait for at least 5 minutes to demonstrate improved functional knee strength.    Time 6    Period Weeks    Status New    Target Date 08/04/21      PT LONG TERM GOAL #3   Title Patient will report at least 75% improvement in overall symptoms and/or function to demonstrate improved functional mobility    Time 6    Period Weeks    Status New    Target Date 08/04/21      PT LONG TERM GOAL #4   Title Patient will be able to go up and down the stairs with use of one railing as needed and reciprocal gait pattern to improve overall stair mobility    Time 6    Period Weeks    Status New    Target  Date 08/04/21                   Plan - 06/29/21 1827     Clinical Impression Statement Pt progressing well towards POC.  Began on bicycle wiht ability to make full revolution.  Added stretches for flexibility and progressed functional strengthening with additional step up and TKE activities.  AROM 0-116 degrees.  No reports of pain through session, encouraged RICE technqiues following therex for pain and edema  control.    Personal Factors and Comorbidities Comorbidity 1;Comorbidity 2;Fitness;Profession    Comorbidities gout, overweigh, arthritis    Examination-Activity Limitations Squat;Stairs;Stand;Transfers;Locomotion Level;Lift    Examination-Participation Restrictions Occupation;Meal Prep;Laundry;Yard Work;Community Activity;Cleaning;Shop    Stability/Clinical Decision Making Stable/Uncomplicated    Clinical Decision Making Low    Rehab Potential Good    PT Frequency 2x / week    PT Duration 6 weeks    PT Treatment/Interventions ADLs/Self Care Home Management;Aquatic Therapy;Cryotherapy;Electrical Stimulation;Moist Heat;Balance training;Therapeutic exercise;Manual techniques;Therapeutic activities;Stair training;Gait training;DME Instruction;Neuromuscular re-education;Patient/family education;Passive range of motion;Dry needling;Joint Manipulations    PT Next Visit Plan edema management, quad/LE strengthening and ROM - bike    PT Home Exercise Plan heel slides, Quad set, SLR; 8/1 ham curls, hip ext, bridge, SAQs, prone quad set    Consulted and Agree with Plan of Care Patient             Patient will benefit from skilled therapeutic intervention in order to improve the following deficits and impairments:  Pain, Decreased strength, Decreased activity tolerance, Difficulty walking, Decreased range of motion, Decreased balance, Abnormal gait, Decreased endurance, Decreased skin integrity, Decreased scar mobility  Visit Diagnosis: Chronic pain of left knee  Difficulty in walking, not elsewhere classified  Muscle weakness (generalized)  Decreased range of motion (ROM) of left knee     Problem List Patient Active Problem List   Diagnosis Date Noted   Type 2 diabetes mellitus with morbid obesity (HCC) 06/23/2021   Gout 06/20/2021   Prediabetes 06/20/2021   S/P arthroscopy of left knee 05/17/21 05/25/2021   Chondral defect of condyle of left femur    Acute medial meniscus tear of left  knee    History of COVID-19 12/20/2020   Cough in adult patient 11/18/2020   Colon cancer screening 03/22/2020   Obstructive sleep apnea 01/05/2020   Morbid obesity (HCC) 10/15/2016   Allergic rhinitis 01/06/2015   Prostate hypertrophy 01/06/2015   Venous stasis 03/23/2014   Venous stasis dermatitis 03/23/2014   Chronic chest pain 06/22/2013   Dyspnea 05/18/2013   Chest pain    HTN (hypertension)    Becky Sax, LPTA/CLT; CBIS [phone]  Juel Burrow 06/29/2021, 6:32 PM  [facility] [address] [place], [place], [address] Phone: [phone]   Fax:  [phone]  Name: James Rivas MRN: [id number] Date of Birth: [birth year]

## 2021-07-05 ENCOUNTER — Other Ambulatory Visit: Payer: Self-pay

## 2021-07-05 ENCOUNTER — Ambulatory Visit (HOSPITAL_COMMUNITY): Payer: BC Managed Care – PPO | Admitting: Physical Therapy

## 2021-07-05 ENCOUNTER — Encounter: Payer: Self-pay | Admitting: Internal Medicine

## 2021-07-05 ENCOUNTER — Ambulatory Visit (INDEPENDENT_AMBULATORY_CARE_PROVIDER_SITE_OTHER): Payer: BC Managed Care – PPO | Admitting: Internal Medicine

## 2021-07-05 DIAGNOSIS — E1169 Type 2 diabetes mellitus with other specified complication: Secondary | ICD-10-CM | POA: Diagnosis not present

## 2021-07-05 DIAGNOSIS — E782 Mixed hyperlipidemia: Secondary | ICD-10-CM

## 2021-07-05 DIAGNOSIS — R262 Difficulty in walking, not elsewhere classified: Secondary | ICD-10-CM | POA: Diagnosis not present

## 2021-07-05 DIAGNOSIS — M6281 Muscle weakness (generalized): Secondary | ICD-10-CM | POA: Diagnosis not present

## 2021-07-05 DIAGNOSIS — M25562 Pain in left knee: Secondary | ICD-10-CM | POA: Diagnosis not present

## 2021-07-05 DIAGNOSIS — B351 Tinea unguium: Secondary | ICD-10-CM

## 2021-07-05 DIAGNOSIS — M25662 Stiffness of left knee, not elsewhere classified: Secondary | ICD-10-CM | POA: Diagnosis not present

## 2021-07-05 DIAGNOSIS — G8929 Other chronic pain: Secondary | ICD-10-CM | POA: Diagnosis not present

## 2021-07-05 DIAGNOSIS — I10 Essential (primary) hypertension: Secondary | ICD-10-CM | POA: Diagnosis not present

## 2021-07-05 DIAGNOSIS — I890 Lymphedema, not elsewhere classified: Secondary | ICD-10-CM

## 2021-07-05 MED ORDER — METFORMIN HCL 500 MG PO TABS: 500.0000 mg | ORAL_TABLET | Freq: Two times a day (BID) | ORAL | 3 refills | Status: DC

## 2021-07-05 MED ORDER — ROSUVASTATIN CALCIUM 10 MG PO TABS
10.0000 mg | ORAL_TABLET | Freq: Every day | ORAL | 1 refills | Status: DC
Start: 2021-07-05 — End: 2021-12-28

## 2021-07-05 NOTE — Progress Notes (Signed)
Established Patient Office Visit  Subjective:  Patient ID: James Rivas, male    DOB: Feb 26, 1971  Age: 50 y.o. MRN: [id number]  CC:  Chief Complaint  Patient presents with   Knee Pain    Pt had knee surgery 05-17-21 he noticed 07-03-21 that his foot was feeling cold to touch is concerned about blood flow since surgery     HPI James Rivas presents for evaluation of right foot cold sensation yesterday, which was isolated episode. He tried to search online and got concerned about circulation problem. Denies any numbness, tingling or weakness of the LE. He has chronic lymphedema. He had left knee surgery in 04/2021, and has been doing PT since then. He also has a walk while at his work on concrete floor.  DM: His HbA1C was 6.5. Denies any polyuria or polyphagia.  HTN: BP is well-controlled. Takes medications regularly. Patient denies headache, dizziness, chest pain, dyspnea or palpitations.   Past Medical History:  Diagnosis Date   Allergy    Arthritis    left knee   Chest pain    Chronic chest pain 06/22/2013   Gout    Gout    History of COVID-19 12/20/2020   HTN (hypertension)    Impaired fasting glucose    Morbid obesity (HCC)    Sleep apnea    uses a CPAP   Venous stasis     Past Surgical History:  Procedure Laterality Date   COLONOSCOPY WITH PROPOFOL N/A 05/11/2020   Procedure: COLONOSCOPY WITH PROPOFOL;  Surgeon: Daneil Dolin, MD;  [facility];  Service: Endoscopy;  Laterality: N/A;  2:15pm   FINGER SURGERY Left    left ring finger 2 surgeries   I & D EXTREMITY  11/29/2011   Procedure: IRRIGATION AND DEBRIDEMENT EXTREMITY;  Surgeon: Linna Hoff;  Location: [facility];  Service: Orthopedics;  Laterality: Left;  I&D Right Long and Index Fingers with Tendon Repair as Needed   KNEE ARTHROSCOPY WITH MEDIAL MENISECTOMY Left 05/17/2021   Procedure: KNEE ARTHROSCOPY WITH MICROFRACTURE;  Surgeon: Carole Civil, MD;  Location: [facility];  Service: Orthopedics;   Laterality: Left;   MENISECTOMY Left 05/17/2021   Procedure: PARTIAL MEDIAL MENISECTOMY;  Surgeon: Carole Civil, MD;  Location: [facility];  Service: Orthopedics;  Laterality: Left;    Family History  Problem Relation Age of Onset   Diabetes Father    Diabetes Paternal Aunt    Colon cancer Neg Hx     Social History   Socioeconomic History   Marital status: Married    Spouse name: Not on file   Number of children: 3   Years of education: Not on file   Highest education level: Not on file  Occupational History   Occupation: [employer]    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 1 beer or a glass of wine a couple times a week.    Drug use: No   Sexual activity: Never  Other Topics Concern   Not on file  Social History Narrative   Not on file   Social Determinants of Health   Financial Resource Strain: Not on file  Food Insecurity: Not on file  Transportation Needs: Not on file  Physical Activity: Not on file  Stress: Not on file  Social Connections: Not on file  Intimate Partner Violence: Not on file    Outpatient Medications Prior to Visit  Medication Sig Dispense Refill   allopurinol (ZYLOPRIM) 300 MG tablet Take 1 tablet (300 mg total) by mouth daily. 30 tablet 5   blood glucose meter kit and supplies Dispense based on patient and insurance preference. Use daily to check blood sugar.. (FOR ICD-10 E10.9, E11.9). 1 each 0   chlorthalidone (HYGROTON) 25 MG tablet TAKE 1 TABLET(25 MG) BY MOUTH DAILY 90 tablet 1   enalapril (VASOTEC) 20 MG tablet TAKE 1 TABLET(20 MG) BY MOUTH DAILY 30 tablet 0   methocarbamol (ROBAXIN) 500 MG tablet Take 1 tablet (500 mg total) by mouth 3 (three) times daily. 21 tablet 0   metoprolol tartrate (LOPRESSOR) 25 MG tablet Take 0.5 tablets (12.5 mg total) by mouth 2 (two)  times daily. TAKE 1/2 TABLET(12.5 MG) BY MOUTH TWICE DAILY (Patient taking differently: Take 25 mg by mouth at bedtime. TAKE 1/2 TABLET(12.5 MG) BY MOUTH TWICE DAILY) 90 tablet 1   tiZANidine (ZANAFLEX) 4 MG tablet Take 1 tablet (4 mg total) by mouth every 6 (six) hours as needed for muscle spasms. 30 tablet 0   HYDROcodone-acetaminophen (NORCO) 10-325 MG tablet Take 1 tablet by mouth every 4 (four) hours as needed. (Patient not taking: Reported on 07/05/2021) 42 tablet 0   Liraglutide -Weight Management (SAXENDA) 18 MG/3ML SOPN Inject 0.6 mg into the skin daily for 7 days, THEN 1.2 mg daily for 7 days, THEN 1.8 mg daily for 7 days, THEN 2.4 mg daily for 7 days, THEN 3 mg daily for 7 days. (Patient not taking: Reported on 06/22/2021) 10.5 mL 0   No facility-administered medications prior to visit.    No Known Allergies  ROS Review of Systems  Constitutional:  Negative for chills and fever.  HENT:  Negative for congestion and sore throat.   Eyes:  Negative for pain and discharge.  Respiratory:  Negative for cough and shortness of breath.   Cardiovascular:  Positive for leg swelling. Negative for chest pain and palpitations.  Gastrointestinal:  Negative for constipation, diarrhea, nausea and vomiting.  Endocrine: Negative for polydipsia and polyuria.  Genitourinary:  Negative for dysuria and hematuria.  Musculoskeletal:  Positive for arthralgias. Negative for neck pain and neck stiffness.  Skin:  Negative for rash.  Neurological:  Negative for dizziness, weakness, numbness and headaches.  Psychiatric/Behavioral:  Negative for agitation and behavioral problems.      Objective:    Physical Exam Vitals reviewed.  Constitutional:      General: He is not in acute distress.    Appearance: He is obese. He is not diaphoretic.  HENT:     Head: Normocephalic and atraumatic.     Nose: Nose normal.     Mouth/Throat:     Mouth: Mucous membranes are moist.  Eyes:     General: No scleral  icterus.    Extraocular Movements: Extraocular movements intact.  Cardiovascular:     Rate and Rhythm: Normal rate and regular rhythm.     Pulses: Normal pulses.     Heart sounds: Normal heart sounds. No murmur heard. Pulmonary:     Breath sounds: Normal breath sounds. No wheezing or rales.  Musculoskeletal:     Cervical back: Neck supple. No tenderness.     Right lower leg: No edema.     Left lower leg: No edema.  Skin:    General: Skin is warm.     Findings: Rash (  stasis dermatitis b/l) present.     Comments: Onychomycosis of toes b/l  Neurological:     General: No focal deficit present.     Mental Status: He is alert and oriented to person, place, and time.     Cranial Nerves: No cranial nerve deficit.     Sensory: No sensory deficit.  Psychiatric:        Mood and Affect: Mood normal.        Behavior: Behavior normal.    BP 131/73 (BP Location: Left Arm, Patient Position: Sitting, Cuff Size: Normal)   Pulse 73   Temp (!) 97.3 F (36.3 C) (Oral)   Resp 18   Ht _0  (1.956 m)   Wt (!) 382 lb 1.3 oz (173.3 kg)   SpO2 97%   BMI 45.31 kg/m  Wt Readings from Last 3 Encounters:  07/05/21 (!) 382 lb 1.3 oz (173.3 kg)  06/20/21 (!) 384 lb (174.2 kg)  05/18/21 (!) 360 lb 0.2 oz (163.3 kg)     Health Maintenance Due  Topic Date Due   PNEUMOCOCCAL POLYSACCHARIDE VACCINE AGE 70-64 HIGH RISK  Never done   OPHTHALMOLOGY EXAM  Never done    There are no preventive care reminders to display for this patient.  Lab Results  Component Value Date   TSH 1.670 06/20/2021   Lab Results  Component Value Date   WBC 7.8 06/20/2021   HGB 14.6 06/20/2021   HCT 44.8 06/20/2021   MCV 81 06/20/2021   PLT 207 06/20/2021   Lab Results  Component Value Date   NA 140 06/20/2021   K 5.0 06/20/2021   CO2 20 06/20/2021   GLUCOSE 116 (H) 06/20/2021   BUN 16 06/20/2021   CREATININE 0.94 06/20/2021   BILITOT 0.3 06/20/2021   ALKPHOS 60 06/20/2021   AST 24 06/20/2021   ALT 18  06/20/2021   PROT 7.4 06/20/2021   ALBUMIN 4.5 06/20/2021   CALCIUM 9.5 06/20/2021   ANIONGAP 6 05/12/2021   EGFR 98 06/20/2021   Lab Results  Component Value Date   CHOL 196 06/20/2021   Lab Results  Component Value Date   HDL 41 06/20/2021   Lab Results  Component Value Date   LDLCALC 133 (H) 06/20/2021   Lab Results  Component Value Date   TRIG 122 06/20/2021   Lab Results  Component Value Date   CHOLHDL 4.8 02/11/2021   Lab Results  Component Value Date   HGBA1C 6.5 (H) 06/20/2021      Assessment & Plan:   Problem List Items Addressed This Visit       Cardiovascular and Mediastinum   HTN (hypertension) (Chronic)    BP Readings from Last 1 Encounters:  07/05/21 131/73  Well-controlled with Enalaprim, Chlorthalidone and Metoprolol Counseled for compliance with the medications Advised DASH diet and moderate exercise/walking, at least 150 mins/week       Relevant Medications   rosuvastatin (CRESTOR) 10 MG tablet     Endocrine   Type 2 diabetes mellitus with morbid obesity ([place]) - Primary    HbA1C: 6.5 Started Metformin Advised to follow diabetic diet On ACEi and started statin F/u CMP and lipid panel Diabetic foot exam: Today Diabetic eye exam: Advised to follow up with Ophthalmology for diabetic eye exam        Relevant Medications   metFORMIN (GLUCOPHAGE) 500 MG tablet   rosuvastatin (CRESTOR) 10 MG tablet     Musculoskeletal and Integument   Onychomycosis    Advised to  use Lamisil         Other   Mixed hyperlipidemia    Lipid profile reviewed Started Crestor       Relevant Medications   rosuvastatin (CRESTOR) 10 MG tablet   Lymphedema    Chronic Leg elevation and/or compression stockings Continue PT.       Chronic pain of left knee    S/p arthroscopic repair in 04/2021 Followed by Orthopedic surgery Tylenol PRN Right foot cold sensation isolated episode, doubt any arterial insufficiency or DVT at this point. No  numbness, tingling or other claudication symptoms.        Meds ordered this encounter  Medications   metFORMIN (GLUCOPHAGE) 500 MG tablet    Sig: Take 1 tablet (500 mg total) by mouth 2 (two) times daily with a meal.    Dispense:  180 tablet    Refill:  3   rosuvastatin (CRESTOR) 10 MG tablet    Sig: Take 1 tablet (10 mg total) by mouth daily.    Dispense:  90 tablet    Refill:  1    Follow-up: Return in about 3 months (around 10/05/2021) for DM and HTN.    Lindell Spar, MD

## 2021-07-05 NOTE — Assessment & Plan Note (Signed)
S/p arthroscopic repair in 04/2021 Followed by Orthopedic surgery Tylenol PRN Right foot cold sensation isolated episode, doubt any arterial insufficiency or DVT at this point. No numbness, tingling or other claudication symptoms.

## 2021-07-05 NOTE — Therapy (Signed)
[facility] [address] [place], [place], [address] Phone: [phone]   Fax:  [phone]  Physical Therapy Treatment  Patient Details  Name: James Rivas MRN: [id number] Date of Birth: [birth year] Referring Provider (PT): Fuller Canada   Encounter Date: 07/05/2021   PT End of Session - 07/05/21 1156     Visit Number 4    Number of Visits 12    Date for PT Re-Evaluation 08/04/21    Authorization Type BCBS COMM PPO no auth or VL    Progress Note Due on Visit 10    PT Start Time 1130    PT Stop Time 1209    PT Time Calculation (min) 39 min    Activity Tolerance Patient tolerated treatment well    Behavior During Therapy [facility] for tasks assessed/performed             Past Medical History:  Diagnosis Date   Allergy    Arthritis    left knee   Chest pain    Gout    Gout    HTN (hypertension)    Impaired fasting glucose    Morbid obesity (HCC)    Sleep apnea    uses a CPAP   Venous stasis     Past Surgical History:  Procedure Laterality Date   COLONOSCOPY WITH PROPOFOL N/A 05/11/2020   Procedure: COLONOSCOPY WITH PROPOFOL;  Surgeon: Corbin Ade, MD;  [facility];  Service: Endoscopy;  Laterality: N/A;  2:15pm   FINGER SURGERY Left    left ring finger 2 surgeries   I & D EXTREMITY  11/29/2011   Procedure: IRRIGATION AND DEBRIDEMENT EXTREMITY;  Surgeon: Sharma Covert;  Location: [facility];  Service: Orthopedics;  Laterality: Left;  I&D Right Long and Index Fingers with Tendon Repair as Needed   KNEE ARTHROSCOPY WITH MEDIAL MENISECTOMY Left 05/17/2021   Procedure: KNEE ARTHROSCOPY WITH MICROFRACTURE;  Surgeon: Vickki Hearing, MD;  Location: [facility];  Service: Orthopedics;  Laterality: Left;   MENISECTOMY Left 05/17/2021   Procedure: PARTIAL MEDIAL MENISECTOMY;  Surgeon: Vickki Hearing, MD;  Location: [facility];  Service: Orthopedics;  Laterality: Left;    There were no vitals filed for this visit.    Subjective Assessment - 07/05/21 1130     Subjective Pt states that he has more stiffness than pain at this time.    Pertinent History gout, arthritis    Limitations Standing;Walking;Lifting;House hold activities    Patient Stated Goals to get his knee 85% of what it was    Currently in Pain? No/denies                               [facility] Adult PT Treatment/Exercise - 07/05/21 0001       Exercises   Exercises Knee/Hip      Knee/Hip Exercises: Stretches   Knee: Self-Stretch to increase Flexion 5 reps;20 seconds    Knee: Self-Stretch Limitations 12in step height knee drive for flexion    Gastroc Stretch Limitations slant board 3 x 30"      Knee/Hip Exercises: Aerobic   Recumbent Bike 5 min no resistance, full revolution seat 23      Knee/Hip Exercises: Standing   Heel Raises Both;15 reps    Knee Flexion Left;15 reps    Knee Flexion Limitations 5#    Forward Lunges Both;10 reps    Terminal Knee Extension Left;15 reps  Terminal Knee Extension Limitations pink ball against wall    Lateral Step Up Left;10 reps;Hand Hold: 1;Step Height: 4"    Forward Step Up Left;10 reps;Step Height: 4";Hand Hold: 1    Step Down Left;10 reps;Hand Hold: 1;Step Height: 4"    SLS x 3 B ; RT: 6" Lt 11"                      PT Short Term Goals - 07/05/21 1202       PT SHORT TERM GOAL #1   Title Patient will demonstrate at least 2-120 degrees of left knee ROM    Time 3    Period Weeks    Status On-going    Target Date 07/14/21      PT SHORT TERM GOAL #2   Title Patient will report at least 50% improvement in overall symptoms and/or function to demonstrate improved functional mobility    Time 3    Period Weeks    Status On-going    Target Date 07/14/21      PT SHORT TERM GOAL #3   Title Patient will be independent in self management strategies to improve quality of life and functional outcomes.    Time 3    Period Weeks    Status On-going    Target Date  07/14/21               PT Long Term Goals - 07/05/21 1204       PT LONG TERM GOAL #1   Title Patient will improve on FOTO score to meet predicted outcomes to demonstrate improved functional mobility.    Time 6    Period Weeks    Status On-going      PT LONG TERM GOAL #2   Title Patietn will be able to walk without brace or antalgic gait for at least 5 minutes to demonstrate improved functional knee strength.    Time 6    Period Weeks    Status On-going      PT LONG TERM GOAL #3   Title Patient will report at least 75% improvement in overall symptoms and/or function to demonstrate improved functional mobility    Time 6    Period Weeks    Status On-going      PT LONG TERM GOAL #4   Title Patient will be able to go up and down the stairs with use of one railing as needed and reciprocal gait pattern to improve overall stair mobility    Time 6    Period Weeks    Status On-going                   Plan - 07/05/21 1158     Clinical Impression Statement Added slant board stretch, lunges and decreaed and hold on steps to one with no complaint of increased pain and good form noted after verbal cuing.    Personal Factors and Comorbidities Comorbidity 1;Comorbidity 2;Fitness;Profession    Comorbidities gout, overweigh, arthritis    Examination-Activity Limitations Squat;Stairs;Stand;Transfers;Locomotion Level;Lift    Examination-Participation Restrictions Occupation;Meal Prep;Laundry;Yard Work;Community Activity;Cleaning;Shop    Stability/Clinical Decision Making Stable/Uncomplicated    Clinical Decision Making Low    Rehab Potential Good    PT Frequency 2x / week    PT Duration 6 weeks    PT Treatment/Interventions ADLs/Self Care Home Management;Aquatic Therapy;Cryotherapy;Electrical Stimulation;Moist Heat;Balance training;Therapeutic exercise;Manual techniques;Therapeutic activities;Stair training;Gait training;DME Instruction;Neuromuscular re-education;Patient/family  education;Passive range of motion;Dry needling;Joint Manipulations    PT  Next Visit Plan edema management, quad/LE strengthening and ROM - bike    PT Home Exercise Plan heel slides, Quad set, SLR; 8/1 ham curls, hip ext, bridge, SAQs, prone quad set; 8/9:  Heel raise, lunge, step up sit to stand             Patient will benefit from skilled therapeutic intervention in order to improve the following deficits and impairments:  Pain, Decreased strength, Decreased activity tolerance, Difficulty walking, Decreased range of motion, Decreased balance, Abnormal gait, Decreased endurance, Decreased skin integrity, Decreased scar mobility  Visit Diagnosis: Chronic pain of left knee  Difficulty in walking, not elsewhere classified  Muscle weakness (generalized)  Decreased range of motion (ROM) of left knee     Problem List Patient Active Problem List   Diagnosis Date Noted   Type 2 diabetes mellitus with morbid obesity (HCC) 06/23/2021   Gout 06/20/2021   Prediabetes 06/20/2021   S/P arthroscopy of left knee 05/17/21 05/25/2021   Chondral defect of condyle of left femur    Acute medial meniscus tear of left knee    History of COVID-19 12/20/2020   Cough in adult patient 11/18/2020   Colon cancer screening 03/22/2020   Obstructive sleep apnea 01/05/2020   Morbid obesity (HCC) 10/15/2016   Allergic rhinitis 01/06/2015   Prostate hypertrophy 01/06/2015   Venous stasis 03/23/2014   Venous stasis dermatitis 03/23/2014   Chronic chest pain 06/22/2013   Dyspnea 05/18/2013   Chest pain    HTN (hypertension)    Virgina Organ, PT CLT [phone]  07/05/2021, 12:09 PM  [facility] [address] [place], [place], [address] Phone: [phone]   Fax:  [phone]  Name: James Rivas MRN: [id number] Date of Birth: [birth year]

## 2021-07-05 NOTE — Assessment & Plan Note (Signed)
Lipid profile reviewed Started Crestor 

## 2021-07-05 NOTE — Assessment & Plan Note (Signed)
HbA1C: 6.5 Started Metformin Advised to follow diabetic diet On ACEi and started statin F/u CMP and lipid panel Diabetic foot exam: Today Diabetic eye exam: Advised to follow up with Ophthalmology for diabetic eye exam

## 2021-07-05 NOTE — Assessment & Plan Note (Signed)
Advised to use Lamisil 

## 2021-07-05 NOTE — Patient Instructions (Signed)
Please start taking Metformin and Crestor as prescribed.  Please continue to follow low carb and low salt diet and participate in physical therapy.

## 2021-07-05 NOTE — Assessment & Plan Note (Signed)
BP Readings from Last 1 Encounters:  07/05/21 131/73   Well-controlled with Enalaprim, Chlorthalidone and Metoprolol Counseled for compliance with the medications Advised DASH diet and moderate exercise/walking, at least 150 mins/week

## 2021-07-05 NOTE — Assessment & Plan Note (Signed)
Chronic Leg elevation and/or compression stockings Continue PT.

## 2021-07-07 ENCOUNTER — Ambulatory Visit (HOSPITAL_COMMUNITY): Payer: BC Managed Care – PPO | Admitting: Physical Therapy

## 2021-07-07 ENCOUNTER — Other Ambulatory Visit: Payer: Self-pay

## 2021-07-07 DIAGNOSIS — G8929 Other chronic pain: Secondary | ICD-10-CM | POA: Diagnosis not present

## 2021-07-07 DIAGNOSIS — M25562 Pain in left knee: Secondary | ICD-10-CM | POA: Diagnosis not present

## 2021-07-07 DIAGNOSIS — R262 Difficulty in walking, not elsewhere classified: Secondary | ICD-10-CM

## 2021-07-07 DIAGNOSIS — M25662 Stiffness of left knee, not elsewhere classified: Secondary | ICD-10-CM | POA: Diagnosis not present

## 2021-07-07 DIAGNOSIS — M6281 Muscle weakness (generalized): Secondary | ICD-10-CM | POA: Diagnosis not present

## 2021-07-07 NOTE — Therapy (Signed)
[facility] [address] [place], [place], [address] Phone: [phone]   Fax:  [phone]  Physical Therapy Treatment  Patient Details  Name: James Rivas MRN: [id number] Date of Birth: 13-Mar-1971 Referring Provider (PT): Fuller Canada   Encounter Date: 07/07/2021   PT End of Session - 07/07/21 1317     Visit Number 5    Number of Visits 12    Date for PT Re-Evaluation 08/04/21    Authorization Type BCBS COMM PPO no auth or VL    Progress Note Due on Visit 10    PT Start Time 0835    PT Stop Time 0918    PT Time Calculation (min) 43 min    Activity Tolerance Patient tolerated treatment well    Behavior During Therapy [facility] for tasks assessed/performed             Past Medical History:  Diagnosis Date   Allergy    Arthritis    left knee   Chest pain    Chronic chest pain 06/22/2013   Gout    Gout    History of COVID-19 12/20/2020   HTN (hypertension)    Impaired fasting glucose    Morbid obesity (HCC)    Sleep apnea    uses a CPAP   Venous stasis     Past Surgical History:  Procedure Laterality Date   COLONOSCOPY WITH PROPOFOL N/A 05/11/2020   Procedure: COLONOSCOPY WITH PROPOFOL;  Surgeon: Corbin Ade, MD;  [facility];  Service: Endoscopy;  Laterality: N/A;  2:15pm   FINGER SURGERY Left    left ring finger 2 surgeries   I & D EXTREMITY  11/29/2011   Procedure: IRRIGATION AND DEBRIDEMENT EXTREMITY;  Surgeon: Sharma Covert;  Location: [facility] OR;  Service: Orthopedics;  Laterality: Left;  I&D Right Long and Index Fingers with Tendon Repair as Needed   KNEE ARTHROSCOPY WITH MEDIAL MENISECTOMY Left 05/17/2021   Procedure: KNEE ARTHROSCOPY WITH MICROFRACTURE;  Surgeon: Vickki Hearing, MD;  Location: [facility] ORS;  Service: Orthopedics;  Laterality: Left;   MENISECTOMY Left 05/17/2021   Procedure: PARTIAL MEDIAL MENISECTOMY;  Surgeon: Vickki Hearing, MD;  Location: [facility] ORS;  Service: Orthopedics;  Laterality:  Left;    There were no vitals filed for this visit.   Subjective Assessment - 07/07/21 0842     Subjective Pt states he went to the MD regarding his Rt LE numbness/coldness and they could not find any reason for this.  STates his pain is 3/10 today in his Lt knee.  Continues to wear a brace on his Lt knee which was given to him at his last ortho appt.    Currently in Pain? Yes    Pain Score 3     Pain Location Knee    Pain Orientation Left    Pain Descriptors / Indicators Aching;Sore                               [facility] Adult PT Treatment/Exercise - 07/07/21 0001       Knee/Hip Exercises: Stretches   Knee: Self-Stretch to increase Flexion 10 seconds    Knee: Self-Stretch Limitations 12in step height knee drive for flexion 10 reps    Gastroc Stretch Limitations slant board 3 x 30"      Knee/Hip Exercises: Aerobic   Recumbent Bike 5 min no resistance, full revolution seat 23  Knee/Hip Exercises: Standing   Heel Raises Both;15 reps    Forward Lunges Both;15 reps    Forward Lunges Limitations onto 4" step no UE's    Lateral Step Up Left;10 reps;Hand Hold: 1;Step Height: 4"    Forward Step Up Left;10 reps;Step Height: 4";Hand Hold: 1    Step Down Left;10 reps;Hand Hold: 1;Step Height: 4"    SLS with Vectors Lt 1 HHA 5X5"                      PT Short Term Goals - 07/05/21 1202       PT SHORT TERM GOAL #1   Title Patient will demonstrate at least 2-120 degrees of left knee ROM    Time 3    Period Weeks    Status On-going    Target Date 07/14/21      PT SHORT TERM GOAL #2   Title Patient will report at least 50% improvement in overall symptoms and/or function to demonstrate improved functional mobility    Time 3    Period Weeks    Status On-going    Target Date 07/14/21      PT SHORT TERM GOAL #3   Title Patient will be independent in self management strategies to improve quality of life and functional outcomes.    Time 3    Period  Weeks    Status On-going    Target Date 07/14/21               PT Long Term Goals - 07/05/21 1204       PT LONG TERM GOAL #1   Title Patient will improve on FOTO score to meet predicted outcomes to demonstrate improved functional mobility.    Time 6    Period Weeks    Status On-going      PT LONG TERM GOAL #2   Title Patietn will be able to walk without brace or antalgic gait for at least 5 minutes to demonstrate improved functional knee strength.    Time 6    Period Weeks    Status On-going      PT LONG TERM GOAL #3   Title Patient will report at least 75% improvement in overall symptoms and/or function to demonstrate improved functional mobility    Time 6    Period Weeks    Status On-going      PT LONG TERM GOAL #4   Title Patient will be able to go up and down the stairs with use of one railing as needed and reciprocal gait pattern to improve overall stair mobility    Time 6    Period Weeks    Status On-going                   Plan - 07/07/21 1317     Clinical Impression Statement Continued with LE strengthening.  Pt limited by pain today only able to complete 5 lateral step ups (with eccentric control) and 5 reps of vectors with 5" holds.  Pt without difficulty completing all other exercises for required amount of reps (10-20 reps).  Pt reported no significant change in pain at end of session.    Personal Factors and Comorbidities Comorbidity 1;Comorbidity 2;Fitness;Profession    Comorbidities gout, overweigh, arthritis    Examination-Activity Limitations Squat;Stairs;Stand;Transfers;Locomotion Level;Lift    Examination-Participation Restrictions Occupation;Meal Prep;Laundry;Yard Work;Community Activity;Cleaning;Shop    Stability/Clinical Decision Making Stable/Uncomplicated    Rehab Potential Good    PT Frequency 2x /  week    PT Duration 6 weeks    PT Treatment/Interventions ADLs/Self Care Home Management;Aquatic Therapy;Cryotherapy;Electrical  Stimulation;Moist Heat;Balance training;Therapeutic exercise;Manual techniques;Therapeutic activities;Stair training;Gait training;DME Instruction;Neuromuscular re-education;Patient/family education;Passive range of motion;Dry needling;Joint Manipulations    PT Next Visit Plan edema management, quad/LE strengthening and ROM    PT Home Exercise Plan heel slides, Quad set, SLR; 8/1 ham curls, hip ext, bridge, SAQs, prone quad set; 8/9:  Heel raise, lunge, step up sit to stand             Patient will benefit from skilled therapeutic intervention in order to improve the following deficits and impairments:  Pain, Decreased strength, Decreased activity tolerance, Difficulty walking, Decreased range of motion, Decreased balance, Abnormal gait, Decreased endurance, Decreased skin integrity, Decreased scar mobility  Visit Diagnosis: Chronic pain of left knee  Difficulty in walking, not elsewhere classified  Muscle weakness (generalized)  Decreased range of motion (ROM) of left knee     Problem List Patient Active Problem List   Diagnosis Date Noted   Mixed hyperlipidemia 07/05/2021   Lymphedema 07/05/2021   Onychomycosis 07/05/2021   Chronic pain of left knee 07/05/2021   Type 2 diabetes mellitus with morbid obesity (HCC) 06/23/2021   Gout 06/20/2021   S/P arthroscopy of left knee 05/17/21 05/25/2021   Chondral defect of condyle of left femur    Acute medial meniscus tear of left knee    Cough in adult patient 11/18/2020   Colon cancer screening 03/22/2020   Obstructive sleep apnea 01/05/2020   Morbid obesity (HCC) 10/15/2016   Allergic rhinitis 01/06/2015   Prostate hypertrophy 01/06/2015   Venous stasis dermatitis 03/23/2014   Dyspnea 05/18/2013   HTN (hypertension)    Lurena Nida, PTA/CLT [phone]  Lurena Nida 07/07/2021, 1:33 PM  [facility] [address] [place], [place], [address] Phone: [phone]   Fax:   [phone]  Name: James Rivas MRN: [id number] Date of Birth: 09/22/1971

## 2021-07-08 ENCOUNTER — Encounter: Payer: Self-pay | Admitting: *Deleted

## 2021-07-11 ENCOUNTER — Ambulatory Visit (HOSPITAL_COMMUNITY): Payer: BC Managed Care – PPO

## 2021-07-11 ENCOUNTER — Encounter (HOSPITAL_COMMUNITY): Payer: Self-pay

## 2021-07-11 ENCOUNTER — Other Ambulatory Visit: Payer: Self-pay

## 2021-07-11 DIAGNOSIS — R262 Difficulty in walking, not elsewhere classified: Secondary | ICD-10-CM | POA: Diagnosis not present

## 2021-07-11 DIAGNOSIS — G8929 Other chronic pain: Secondary | ICD-10-CM

## 2021-07-11 DIAGNOSIS — M6281 Muscle weakness (generalized): Secondary | ICD-10-CM

## 2021-07-11 DIAGNOSIS — M25662 Stiffness of left knee, not elsewhere classified: Secondary | ICD-10-CM | POA: Diagnosis not present

## 2021-07-11 DIAGNOSIS — M25562 Pain in left knee: Secondary | ICD-10-CM | POA: Diagnosis not present

## 2021-07-11 NOTE — Therapy (Signed)
[facility] [address] [place], [place], [address] Phone: [phone]   Fax:  [phone]  Physical Therapy Treatment  Patient Details  Name: James Rivas MRN: [id number] Date of Birth: 06/09/1971 Referring Provider (PT): Fuller Canada   Encounter Date: 07/11/2021   PT End of Session - 07/11/21 1045     Visit Number 6    Number of Visits 12    Date for PT Re-Evaluation 08/04/21    Authorization Type BCBS COMM PPO no auth or VL    Progress Note Due on Visit 10    PT Start Time 1047    PT Stop Time 1128    PT Time Calculation (min) 41 min    Activity Tolerance Patient tolerated treatment well    Behavior During Therapy [facility] for tasks assessed/performed             Past Medical History:  Diagnosis Date   Allergy    Arthritis    left knee   Chest pain    Chronic chest pain 06/22/2013   Gout    Gout    History of COVID-19 12/20/2020   HTN (hypertension)    Impaired fasting glucose    Morbid obesity (HCC)    Sleep apnea    uses a CPAP   Venous stasis     Past Surgical History:  Procedure Laterality Date   COLONOSCOPY WITH PROPOFOL N/A 05/11/2020   Procedure: COLONOSCOPY WITH PROPOFOL;  Surgeon: Corbin Ade, MD;  [facility];  Service: Endoscopy;  Laterality: N/A;  2:15pm   FINGER SURGERY Left    left ring finger 2 surgeries   I & D EXTREMITY  11/29/2011   Procedure: IRRIGATION AND DEBRIDEMENT EXTREMITY;  Surgeon: Sharma Covert;  Location: [facility] OR;  Service: Orthopedics;  Laterality: Left;  I&D Right Long and Index Fingers with Tendon Repair as Needed   KNEE ARTHROSCOPY WITH MEDIAL MENISECTOMY Left 05/17/2021   Procedure: KNEE ARTHROSCOPY WITH MICROFRACTURE;  Surgeon: Vickki Hearing, MD;  Location: [facility];  Service: Orthopedics;  Laterality: Left;   MENISECTOMY Left 05/17/2021   Procedure: PARTIAL MEDIAL MENISECTOMY;  Surgeon: Vickki Hearing, MD;  Location: [facility];  Service: Orthopedics;  Laterality:  Left;    There were no vitals filed for this visit.   Subjective Assessment - 07/11/21 1045     Subjective Patient states he had to pull off highway and get out of car after taking his son to school. First time his knee has hurt so much he has had to pull off road. Been hurting more the last week. Feels like his pain has been increasing since his PT session have advanced.    Currently in Pain? Yes    Pain Score 6     Pain Location Knee    Pain Orientation Anterior;Posterior;Lateral    Pain Descriptors / Indicators Sharp;Throbbing               [facility] Adult PT Treatment/Exercise - 07/11/21 0001       Knee/Hip Exercises: Stretches   Knee: Self-Stretch to increase Flexion 10 seconds    Knee: Self-Stretch Limitations 12in step height knee drive for flexion 10 reps    Gastroc Stretch Limitations slant board 3 x 30"      Knee/Hip Exercises: Standing   Heel Raises Both;15 reps    Terminal Knee Extension Left;15 reps    Terminal Knee Extension Limitations pink ball against wall 5 sec hold  Forward Step Up Left;10 reps;Step Height: 4";Hand Hold: 1      Knee/Hip Exercises: Seated   Long Arc Quad 10 reps    Long Arc Quad Weight 3 lbs.    Long Arc Quad Limitations hold 5 sec      Knee/Hip Exercises: Prone   Straight Leg Raises Strengthening;Left;1 set;15 reps    Other Prone Exercises TKE 5 sec hold x10                    PT Education - 07/11/21 1059     Education Details Discussed purpose and technique of interventions throughout session. Educated on correct positioning of left hinged knee brace.    Person(s) Educated Patient    Methods Explanation    Comprehension Verbalized understanding              PT Short Term Goals - 07/05/21 1202       PT SHORT TERM GOAL #1   Title Patient will demonstrate at least 2-120 degrees of left knee ROM    Time 3    Period Weeks    Status On-going    Target Date 07/14/21      PT SHORT TERM GOAL #2   Title Patient  will report at least 50% improvement in overall symptoms and/or function to demonstrate improved functional mobility    Time 3    Period Weeks    Status On-going    Target Date 07/14/21      PT SHORT TERM GOAL #3   Title Patient will be independent in self management strategies to improve quality of life and functional outcomes.    Time 3    Period Weeks    Status On-going    Target Date 07/14/21               PT Long Term Goals - 07/05/21 1204       PT LONG TERM GOAL #1   Title Patient will improve on FOTO score to meet predicted outcomes to demonstrate improved functional mobility.    Time 6    Period Weeks    Status On-going      PT LONG TERM GOAL #2   Title Patietn will be able to walk without brace or antalgic gait for at least 5 minutes to demonstrate improved functional knee strength.    Time 6    Period Weeks    Status On-going      PT LONG TERM GOAL #3   Title Patient will report at least 75% improvement in overall symptoms and/or function to demonstrate improved functional mobility    Time 6    Period Weeks    Status On-going      PT LONG TERM GOAL #4   Title Patient will be able to go up and down the stairs with use of one railing as needed and reciprocal gait pattern to improve overall stair mobility    Time 6    Period Weeks    Status On-going                   Plan - 07/11/21 1047     Clinical Impression Statement Continued with LE strengthening.  Pt limited by pain today with more weight bearing exercises. Decreased weight bearing of exercises after increased pain complaints with forward step ups on 4" box. Added prone TKE and prone SLR for additional posterior strengthening. Patient reported a decrease in pain at end of session at a 3/10.  PT encouraged patient to perform lesser weight bearing HEP until next session to continue strengthening.    Personal Factors and Comorbidities Comorbidity 1;Comorbidity 2;Fitness;Profession     Comorbidities gout, overweigh, arthritis    Examination-Activity Limitations Squat;Stairs;Stand;Transfers;Locomotion Level;Lift    Examination-Participation Restrictions Occupation;Meal Prep;Laundry;Yard Work;Community Activity;Cleaning;Shop    Stability/Clinical Decision Making Stable/Uncomplicated    Rehab Potential Good    PT Frequency 2x / week    PT Duration 6 weeks    PT Treatment/Interventions ADLs/Self Care Home Management;Aquatic Therapy;Cryotherapy;Electrical Stimulation;Moist Heat;Balance training;Therapeutic exercise;Manual techniques;Therapeutic activities;Stair training;Gait training;DME Instruction;Neuromuscular re-education;Patient/family education;Passive range of motion;Dry needling;Joint Manipulations    PT Next Visit Plan edema management, quad/LE strengthening and ROM    PT Home Exercise Plan heel slides, Quad set, SLR; 8/1 ham curls, hip ext, bridge, SAQs, prone quad set; 8/9:  Heel raise, lunge, step up sit to stand             Patient will benefit from skilled therapeutic intervention in order to improve the following deficits and impairments:  Pain, Decreased strength, Decreased activity tolerance, Difficulty walking, Decreased range of motion, Decreased balance, Abnormal gait, Decreased endurance, Decreased skin integrity, Decreased scar mobility  Visit Diagnosis: Difficulty in walking, not elsewhere classified  Chronic pain of left knee  Muscle weakness (generalized)  Decreased range of motion (ROM) of left knee     Problem List Patient Active Problem List   Diagnosis Date Noted   Mixed hyperlipidemia 07/05/2021   Lymphedema 07/05/2021   Onychomycosis 07/05/2021   Chronic pain of left knee 07/05/2021   Type 2 diabetes mellitus with morbid obesity (HCC) 06/23/2021   Gout 06/20/2021   S/P arthroscopy of left knee 05/17/21 05/25/2021   Chondral defect of condyle of left femur    Acute medial meniscus tear of left knee    Cough in adult patient  11/18/2020   Colon cancer screening 03/22/2020   Obstructive sleep apnea 01/05/2020   Morbid obesity (HCC) 10/15/2016   Allergic rhinitis 01/06/2015   Prostate hypertrophy 01/06/2015   Venous stasis dermatitis 03/23/2014   Dyspnea 05/18/2013   HTN (hypertension)    Katina Dung. Hartnett-Rands, MS, PT [facility] [phone] Epifanio Lesches 07/11/2021, 2:25 PM  [facility] [address] [place], [place], [address] Phone: [phone]   Fax:  [phone]  Name: James Rivas MRN: [id number] Date of Birth: Apr 22, 1971

## 2021-07-14 ENCOUNTER — Ambulatory Visit (HOSPITAL_COMMUNITY): Payer: BC Managed Care – PPO | Admitting: Physical Therapy

## 2021-07-14 ENCOUNTER — Other Ambulatory Visit: Payer: Self-pay

## 2021-07-14 ENCOUNTER — Encounter (HOSPITAL_COMMUNITY): Payer: Self-pay | Admitting: Physical Therapy

## 2021-07-14 DIAGNOSIS — M25662 Stiffness of left knee, not elsewhere classified: Secondary | ICD-10-CM

## 2021-07-14 DIAGNOSIS — M25562 Pain in left knee: Secondary | ICD-10-CM | POA: Diagnosis not present

## 2021-07-14 DIAGNOSIS — G8929 Other chronic pain: Secondary | ICD-10-CM | POA: Diagnosis not present

## 2021-07-14 DIAGNOSIS — M6281 Muscle weakness (generalized): Secondary | ICD-10-CM | POA: Diagnosis not present

## 2021-07-14 DIAGNOSIS — E1169 Type 2 diabetes mellitus with other specified complication: Secondary | ICD-10-CM

## 2021-07-14 DIAGNOSIS — R262 Difficulty in walking, not elsewhere classified: Secondary | ICD-10-CM | POA: Diagnosis not present

## 2021-07-14 MED ORDER — GLUCOSE BLOOD VI STRP: ORAL_STRIP | 12 refills | Status: DC

## 2021-07-14 NOTE — Therapy (Signed)
[facility] [address] [place], [place], [address] Phone: [phone]   Fax:  [phone]  Physical Therapy Treatment  Patient Details  Name: James Rivas MRN: [id number] Date of Birth: 1970/12/27 Referring Provider (PT): Fuller Canada   Encounter Date: 07/14/2021   PT End of Session - 07/14/21 0825     Visit Number 7    Number of Visits 12    Date for PT Re-Evaluation 08/04/21    Authorization Type BCBS COMM PPO no auth or VL    Progress Note Due on Visit 10    PT Start Time 0830    PT Stop Time 0915    PT Time Calculation (min) 45 min    Activity Tolerance Patient tolerated treatment well    Behavior During Therapy [facility] for tasks assessed/performed             Past Medical History:  Diagnosis Date   Allergy    Arthritis    left knee   Chest pain    Chronic chest pain 06/22/2013   Gout    Gout    History of COVID-19 12/20/2020   HTN (hypertension)    Impaired fasting glucose    Morbid obesity (HCC)    Sleep apnea    uses a CPAP   Venous stasis     Past Surgical History:  Procedure Laterality Date   COLONOSCOPY WITH PROPOFOL N/A 05/11/2020   Procedure: COLONOSCOPY WITH PROPOFOL;  Surgeon: Corbin Ade, MD;  [facility];  Service: Endoscopy;  Laterality: N/A;  2:15pm   FINGER SURGERY Left    left ring finger 2 surgeries   I & D EXTREMITY  11/29/2011   Procedure: IRRIGATION AND DEBRIDEMENT EXTREMITY;  Surgeon: Sharma Covert;  Location: [facility] OR;  Service: Orthopedics;  Laterality: Left;  I&D Right Long and Index Fingers with Tendon Repair as Needed   KNEE ARTHROSCOPY WITH MEDIAL MENISECTOMY Left 05/17/2021   Procedure: KNEE ARTHROSCOPY WITH MICROFRACTURE;  Surgeon: Vickki Hearing, MD;  Location: [facility] OR[facility];  Service: Orthopedics;  Laterality: Left;   MENISECTOMY Left 05/17/2021   Procedure: PARTIAL MEDIAL MENISECTOMY;  Surgeon: Vickki Hearing, MD;  Location: [facility];  Service: Orthopedics;  Laterality:  Left;    There were no vitals filed for this visit.   Subjective Assessment - 07/14/21 0833     Subjective Reports pain 3/10 on left knee. No instances of severe pain since he was last seen, seems to have tolerated last session better.    Currently in Pain? Yes    Pain Score 3     Pain Location Knee    Pain Orientation Left;Anterior;Medial                [facility] PT Assessment - 07/14/21 0001       Assessment   Medical Diagnosis left knee scope with medial menisectomy and medial microfracture    Referring Provider (PT) Fuller Canada    Onset Date/Surgical Date 05/17/21    Next MD Visit 08/03/21                           [facility] Adult PT Treatment/Exercise - 07/14/21 0001       Knee/Hip Exercises: Standing   Other Standing Knee Exercises ball wall squats - mini x5 5" holds - cue to leg press the floor away      Knee/Hip Exercises: Seated   Long Arc Quad 5  reps;Left;2 sets   30 second holds   Other Seated Knee/Hip Exercises scar mobilization - 2 minutes, self mobilization with rolling stick- 5 minutes; self patella mobiliaztions - 2 minutes    Other Seated Knee/Hip Exercises kne flexion stretch x10 10" holds left      Manual Therapy   Manual Therapy Manual Traction;Joint mobilization;Soft tissue mobilization    Manual therapy comments all manual interventions performed independently of other interventions    Joint Mobilization left patella mobilizations in all directions - 5 minutes    Soft tissue mobilization STM to Left eidal and lateral muscle sof the knee - tolerated well    Manual Traction left knee traction - continuous for 2 minutes x5 throughout session - 10 minutes total                    PT Education - 07/14/21 0930     Education Details on anatomy, on what to do with pain, likely contributors to pain and plan moving forward    Person(s) Educated Patient    Methods Explanation    Comprehension Verbalized understanding               PT Short Term Goals - 07/05/21 1202       PT SHORT TERM GOAL #1   Title Patient will demonstrate at least 2-120 degrees of left knee ROM    Time 3    Period Weeks    Status On-going    Target Date 07/14/21      PT SHORT TERM GOAL #2   Title Patient will report at least 50% improvement in overall symptoms and/or function to demonstrate improved functional mobility    Time 3    Period Weeks    Status On-going    Target Date 07/14/21      PT SHORT TERM GOAL #3   Title Patient will be independent in self management strategies to improve quality of life and functional outcomes.    Time 3    Period Weeks    Status On-going    Target Date 07/14/21               PT Long Term Goals - 07/05/21 1204       PT LONG TERM GOAL #1   Title Patient will improve on FOTO score to meet predicted outcomes to demonstrate improved functional mobility.    Time 6    Period Weeks    Status On-going      PT LONG TERM GOAL #2   Title Patietn will be able to walk without brace or antalgic gait for at least 5 minutes to demonstrate improved functional knee strength.    Time 6    Period Weeks    Status On-going      PT LONG TERM GOAL #3   Title Patient will report at least 75% improvement in overall symptoms and/or function to demonstrate improved functional mobility    Time 6    Period Weeks    Status On-going      PT LONG TERM GOAL #4   Title Patient will be able to go up and down the stairs with use of one railing as needed and reciprocal gait pattern to improve overall stair mobility    Time 6    Period Weeks    Status On-going                   Plan - 07/14/21 9509  Clinical Impression Statement Patient tolerated session well. Positive response to traction supporting possible overloading of meniscal tissue. Patient demonstrates left patella in superior and lateral position. Tolerated patella mobilizations and soft tissue mobilization well. no pain noted end  of session and improved overall tension in the knee noted end of session. Instructed patient to perform these at home. Will follow up with traction to determine if this needs to be added to HEP as well.    Personal Factors and Comorbidities Comorbidity 1;Comorbidity 2;Fitness;Profession    Comorbidities gout, overweigh, arthritis    Examination-Activity Limitations Squat;Stairs;Stand;Transfers;Locomotion Level;Lift    Examination-Participation Restrictions Occupation;Meal Prep;Laundry;Yard Work;Community Activity;Cleaning;Shop    Stability/Clinical Decision Making Stable/Uncomplicated    Rehab Potential Good    PT Frequency 2x / week    PT Duration 6 weeks    PT Treatment/Interventions ADLs/Self Care Home Management;Aquatic Therapy;Cryotherapy;Electrical Stimulation;Moist Heat;Balance training;Therapeutic exercise;Manual techniques;Therapeutic activities;Stair training;Gait training;DME Instruction;Neuromuscular re-education;Patient/family education;Passive range of motion;Dry needling;Joint Manipulations    PT Next Visit Plan edema management, quad/LE strengthening and ROM    PT Home Exercise Plan heel slides, Quad set, SLR; 8/1 ham curls, hip ext, bridge, SAQs, prone quad set; 8/9:  Heel raise, lunge, step up sit to stand; 8/18 self mobilization, scar mobilization, patella mobs             Patient will benefit from skilled therapeutic intervention in order to improve the following deficits and impairments:  Pain, Decreased strength, Decreased activity tolerance, Difficulty walking, Decreased range of motion, Decreased balance, Abnormal gait, Decreased endurance, Decreased skin integrity, Decreased scar mobility  Visit Diagnosis: Difficulty in walking, not elsewhere classified  Chronic pain of left knee  Muscle weakness (generalized)  Decreased range of motion (ROM) of left knee     Problem List Patient Active Problem List   Diagnosis Date Noted   Mixed hyperlipidemia 07/05/2021    Lymphedema 07/05/2021   Onychomycosis 07/05/2021   Chronic pain of left knee 07/05/2021   Type 2 diabetes mellitus with morbid obesity (HCC) 06/23/2021   Gout 06/20/2021   S/P arthroscopy of left knee 05/17/21 05/25/2021   Chondral defect of condyle of left femur    Acute medial meniscus tear of left knee    Cough in adult patient 11/18/2020   Colon cancer screening 03/22/2020   Obstructive sleep apnea 01/05/2020   Morbid obesity (HCC) 10/15/2016   Allergic rhinitis 01/06/2015   Prostate hypertrophy 01/06/2015   Venous stasis dermatitis 03/23/2014   Dyspnea 05/18/2013   HTN (hypertension)    9:31 AM, 07/14/21 Tereasa Coop, DPT Physical Therapy with [facility]  [phone] office   [facility] [address] [place], [place], [address] Phone: [phone]   Fax:  [phone]  Name: DEVELLE SIEVERS MRN: [id number] Date of Birth: Sep 09, 1971

## 2021-07-19 ENCOUNTER — Ambulatory Visit (HOSPITAL_COMMUNITY): Payer: BC Managed Care – PPO | Admitting: Physical Therapy

## 2021-07-19 ENCOUNTER — Other Ambulatory Visit: Payer: Self-pay

## 2021-07-19 DIAGNOSIS — R262 Difficulty in walking, not elsewhere classified: Secondary | ICD-10-CM

## 2021-07-19 DIAGNOSIS — M6281 Muscle weakness (generalized): Secondary | ICD-10-CM | POA: Diagnosis not present

## 2021-07-19 DIAGNOSIS — G8929 Other chronic pain: Secondary | ICD-10-CM

## 2021-07-19 DIAGNOSIS — M25562 Pain in left knee: Secondary | ICD-10-CM | POA: Diagnosis not present

## 2021-07-19 DIAGNOSIS — M25662 Stiffness of left knee, not elsewhere classified: Secondary | ICD-10-CM

## 2021-07-19 NOTE — Therapy (Signed)
[facility] [address] [place], [place], [address] Phone: [phone]   Fax:  [phone]  Physical Therapy Treatment  Patient Details  Name: James Rivas MRN: [id number] Date of Birth: 05/01/1971 Referring Provider (PT): Fuller Canada   Encounter Date: 07/19/2021   PT End of Session - 07/19/21 1130     Visit Number 8    Number of Visits 12    Date for PT Re-Evaluation 08/04/21    Authorization Type BCBS COMM PPO no auth or VL    Progress Note Due on Visit 10    PT Start Time 1048    PT Stop Time 1128    PT Time Calculation (min) 40 min    Activity Tolerance Patient tolerated treatment well    Behavior During Therapy [facility] for tasks assessed/performed             Past Medical History:  Diagnosis Date   Allergy    Arthritis    left knee   Chest pain    Chronic chest pain 06/22/2013   Gout    Gout    History of COVID-19 12/20/2020   HTN (hypertension)    Impaired fasting glucose    Morbid obesity (HCC)    Sleep apnea    uses a CPAP   Venous stasis     Past Surgical History:  Procedure Laterality Date   COLONOSCOPY WITH PROPOFOL N/A 05/11/2020   Procedure: COLONOSCOPY WITH PROPOFOL;  Surgeon: Corbin Ade, MD;  [facility];  Service: Endoscopy;  Laterality: N/A;  2:15pm   FINGER SURGERY Left    left ring finger 2 surgeries   I & D EXTREMITY  11/29/2011   Procedure: IRRIGATION AND DEBRIDEMENT EXTREMITY;  Surgeon: Sharma Covert;  Location: [facility] OR;  Service: Orthopedics;  Laterality: Left;  I&D Right Long and Index Fingers with Tendon Repair as Needed   KNEE ARTHROSCOPY WITH MEDIAL MENISECTOMY Left 05/17/2021   Procedure: KNEE ARTHROSCOPY WITH MICROFRACTURE;  Surgeon: Vickki Hearing, MD;  Location: [facility];  Service: Orthopedics;  Laterality: Left;   MENISECTOMY Left 05/17/2021   Procedure: PARTIAL MEDIAL MENISECTOMY;  Surgeon: Vickki Hearing, MD;  Location: [facility];  Service: Orthopedics;  Laterality:  Left;    There were no vitals filed for this visit.   Subjective Assessment - 07/19/21 1056     Subjective pt states it is stil sore but no other issues. Returns to MD sept 7th.    Currently in Pain? Yes    Pain Score 2     Pain Location Knee    Pain Orientation Left;Anterior    Pain Descriptors / Indicators Aching;Tender;[facility] Adult PT Treatment/Exercise - 07/19/21 0001       Knee/Hip Exercises: Stretches   Gastroc Stretch Limitations slant board 3 x 30"      Knee/Hip Exercises: Aerobic   Recumbent Bike 5 min no resistance, full revolution seat 23      Knee/Hip Exercises: Standing   Heel Raises Both;20 reps    Forward Lunges Left;15 reps;Limitations    Forward Lunges Limitations onto 4" box without UE asssit    Lateral Step Up Left;Hand Hold: 1;Step Height: 4";15 reps    Lateral Step Up Limitations eccentric control    Forward Step Up Left;Step Height: 4";Hand Hold: 1;15  reps    Step Down Left;15 reps;Hand Hold: 1;Step Height: 4"    Step Down Limitations eccentric control    Wall Squat 10 reps    Wall Squat Limitations 10" holds with ball    SLS with Vectors Lt 1 HHA 10X5"                      PT Short Term Goals - 07/05/21 1202       PT SHORT TERM GOAL #1   Title Patient will demonstrate at least 2-120 degrees of left knee ROM    Time 3    Period Weeks    Status On-going    Target Date 07/14/21      PT SHORT TERM GOAL #2   Title Patient will report at least 50% improvement in overall symptoms and/or function to demonstrate improved functional mobility    Time 3    Period Weeks    Status On-going    Target Date 07/14/21      PT SHORT TERM GOAL #3   Title Patient will be independent in self management strategies to improve quality of life and functional outcomes.    Time 3    Period Weeks    Status On-going    Target Date 07/14/21               PT Long Term Goals - 07/05/21 1204        PT LONG TERM GOAL #1   Title Patient will improve on FOTO score to meet predicted outcomes to demonstrate improved functional mobility.    Time 6    Period Weeks    Status On-going      PT LONG TERM GOAL #2   Title Patietn will be able to walk without brace or antalgic gait for at least 5 minutes to demonstrate improved functional knee strength.    Time 6    Period Weeks    Status On-going      PT LONG TERM GOAL #3   Title Patient will report at least 75% improvement in overall symptoms and/or function to demonstrate improved functional mobility    Time 6    Period Weeks    Status On-going      PT LONG TERM GOAL #4   Title Patient will be able to go up and down the stairs with use of one railing as needed and reciprocal gait pattern to improve overall stair mobility    Time 6    Period Weeks    Status On-going                   Plan - 07/19/21 1131     Clinical Impression Statement continued with focus on improving functional strength of Lt LE.  Progressed with forward/lateral step ups and forward step downs as well as lunges and static balance tasks.  Pt able to complete all with minimal cues for eccentric control and no complaints of pain with either activity.  Continued with wall squats with increased hold time this session to 10 seconds.   Vectors most challenging for patient with postural cues and reminder to hold lateral and posterior holds full 5 seconds.    Personal Factors and Comorbidities Comorbidity 1;Comorbidity 2;Fitness;Profession    Comorbidities gout, overweigh, arthritis    Examination-Activity Limitations Squat;Stairs;Stand;Transfers;Locomotion Level;Lift    Examination-Participation Restrictions Occupation;Meal Prep;Laundry;Yard Work;Community Activity;Cleaning;Shop    Stability/Clinical Decision Making Stable/Uncomplicated    Rehab Potential Good    PT  Frequency 2x / week    PT Duration 6 weeks    PT Treatment/Interventions ADLs/Self Care  Home Management;Aquatic Therapy;Cryotherapy;Electrical Stimulation;Moist Heat;Balance training;Therapeutic exercise;Manual techniques;Therapeutic activities;Stair training;Gait training;DME Instruction;Neuromuscular re-education;Patient/family education;Passive range of motion;Dry needling;Joint Manipulations    PT Next Visit Plan edema management, quad/LE strengthening and ROM    PT Home Exercise Plan heel slides, Quad set, SLR; 8/1 ham curls, hip ext, bridge, SAQs, prone quad set; 8/9:  Heel raise, lunge, step up sit to stand; 8/18 self mobilization, scar mobilization, patella mobs             Patient will benefit from skilled therapeutic intervention in order to improve the following deficits and impairments:  Pain, Decreased strength, Decreased activity tolerance, Difficulty walking, Decreased range of motion, Decreased balance, Abnormal gait, Decreased endurance, Decreased skin integrity, Decreased scar mobility  Visit Diagnosis: Difficulty in walking, not elsewhere classified  Chronic pain of left knee  Decreased range of motion (ROM) of left knee  Muscle weakness (generalized)     Problem List Patient Active Problem List   Diagnosis Date Noted   Mixed hyperlipidemia 07/05/2021   Lymphedema 07/05/2021   Onychomycosis 07/05/2021   Chronic pain of left knee 07/05/2021   Type 2 diabetes mellitus with morbid obesity (HCC) 06/23/2021   Gout 06/20/2021   S/P arthroscopy of left knee 05/17/21 05/25/2021   Chondral defect of condyle of left femur    Acute medial meniscus tear of left knee    Cough in adult patient 11/18/2020   Colon cancer screening 03/22/2020   Obstructive sleep apnea 01/05/2020   Morbid obesity (HCC) 10/15/2016   Allergic rhinitis 01/06/2015   Prostate hypertrophy 01/06/2015   Venous stasis dermatitis 03/23/2014   Dyspnea 05/18/2013   HTN (hypertension)    Lurena Nida, PTA/CLT [phone]  Lurena Nida 07/19/2021, 11:31 AM  [facility] [address] [place], [place], [address] Phone: [phone]   Fax:  [phone]  Name: James Rivas MRN: [id number] Date of Birth: 1971-10-16

## 2021-07-21 ENCOUNTER — Other Ambulatory Visit: Payer: Self-pay

## 2021-07-21 ENCOUNTER — Encounter (HOSPITAL_COMMUNITY): Payer: Self-pay | Admitting: Physical Therapy

## 2021-07-21 ENCOUNTER — Ambulatory Visit (HOSPITAL_COMMUNITY): Payer: BC Managed Care – PPO | Admitting: Physical Therapy

## 2021-07-21 DIAGNOSIS — G8929 Other chronic pain: Secondary | ICD-10-CM

## 2021-07-21 DIAGNOSIS — M6281 Muscle weakness (generalized): Secondary | ICD-10-CM | POA: Diagnosis not present

## 2021-07-21 DIAGNOSIS — R262 Difficulty in walking, not elsewhere classified: Secondary | ICD-10-CM | POA: Diagnosis not present

## 2021-07-21 DIAGNOSIS — M25562 Pain in left knee: Secondary | ICD-10-CM | POA: Diagnosis not present

## 2021-07-21 DIAGNOSIS — M25662 Stiffness of left knee, not elsewhere classified: Secondary | ICD-10-CM | POA: Diagnosis not present

## 2021-07-21 NOTE — Therapy (Signed)
[facility] [address] [place], [place], [address] Phone: [phone]   Fax:  [phone]  Physical Therapy Treatment  Patient Details  Name: James Rivas MRN: [id number] Date of Birth: 11-18-1971 Referring Provider (PT): Fuller Canada   Encounter Date: 07/21/2021   PT End of Session - 07/21/21 1052     Visit Number 9    Number of Visits 12    Date for PT Re-Evaluation 08/04/21    Authorization Type BCBS COMM PPO no auth or VL    Progress Note Due on Visit 10    PT Start Time 1050    PT Stop Time 1130    PT Time Calculation (min) 40 min    Activity Tolerance Patient tolerated treatment well    Behavior During Therapy [facility] for tasks assessed/performed             Past Medical History:  Diagnosis Date   Allergy    Arthritis    left knee   Chest pain    Chronic chest pain 06/22/2013   Gout    Gout    History of COVID-19 12/20/2020   HTN (hypertension)    Impaired fasting glucose    Morbid obesity (HCC)    Sleep apnea    uses a CPAP   Venous stasis     Past Surgical History:  Procedure Laterality Date   COLONOSCOPY WITH PROPOFOL N/A 05/11/2020   Procedure: COLONOSCOPY WITH PROPOFOL;  Surgeon: Corbin Ade, MD;  [facility];  Service: Endoscopy;  Laterality: N/A;  2:15pm   FINGER SURGERY Left    left ring finger 2 surgeries   I & D EXTREMITY  11/29/2011   Procedure: IRRIGATION AND DEBRIDEMENT EXTREMITY;  Surgeon: Sharma Covert;  Location: [facility] OR;  Service: Orthopedics;  Laterality: Left;  I&D Right Long and Index Fingers with Tendon Repair as Needed   KNEE ARTHROSCOPY WITH MEDIAL MENISECTOMY Left 05/17/2021   Procedure: KNEE ARTHROSCOPY WITH MICROFRACTURE;  Surgeon: Vickki Hearing, MD;  Location: [facility];  Service: Orthopedics;  Laterality: Left;   MENISECTOMY Left 05/17/2021   Procedure: PARTIAL MEDIAL MENISECTOMY;  Surgeon: Vickki Hearing, MD;  Location: [facility];  Service: Orthopedics;  Laterality:  Left;    There were no vitals filed for this visit.   Subjective Assessment - 07/21/21 1106     Subjective Current knee pain 2-10. Reports he has been doing  his mobilization exercises to help with pain.    Currently in Pain? Yes    Pain Score 2     Pain Location Knee    Pain Orientation Left;Anterior    Pain Descriptors / Indicators Aching                [facility] PT Assessment - 07/21/21 0001       Assessment   Medical Diagnosis left knee scope with medial menisectomy and medial microfracture    Referring Provider (PT) Fuller Canada    Onset Date/Surgical Date 05/17/21    Next MD Visit 08/03/21                           [facility] Adult PT Treatment/Exercise - 07/21/21 0001       Knee/Hip Exercises: Machines for Strengthening   Total Gym Leg Press 3x10 with 75% weight on left, then 5 pulses at bottom 3x5, heel raise x12 slow and controlled   level 24  Knee/Hip Exercises: Standing   Step Down Both;3 sets;5 reps;Hand Hold: 1;Step Height: 4"   eccentric     Knee/Hip Exercises: Seated   Other Seated Knee/Hip Exercises patella mobilizaztions L x25 5" holds    Sit to Sand --   eccentric 2x12                     PT Short Term Goals - 07/05/21 1202       PT SHORT TERM GOAL #1   Title Patient will demonstrate at least 2-120 degrees of left knee ROM    Time 3    Period Weeks    Status On-going    Target Date 07/14/21      PT SHORT TERM GOAL #2   Title Patient will report at least 50% improvement in overall symptoms and/or function to demonstrate improved functional mobility    Time 3    Period Weeks    Status On-going    Target Date 07/14/21      PT SHORT TERM GOAL #3   Title Patient will be independent in self management strategies to improve quality of life and functional outcomes.    Time 3    Period Weeks    Status On-going    Target Date 07/14/21               PT Long Term Goals - 07/05/21 1204       PT LONG TERM  GOAL #1   Title Patient will improve on FOTO score to meet predicted outcomes to demonstrate improved functional mobility.    Time 6    Period Weeks    Status On-going      PT LONG TERM GOAL #2   Title Patietn will be able to walk without brace or antalgic gait for at least 5 minutes to demonstrate improved functional knee strength.    Time 6    Period Weeks    Status On-going      PT LONG TERM GOAL #3   Title Patient will report at least 75% improvement in overall symptoms and/or function to demonstrate improved functional mobility    Time 6    Period Weeks    Status On-going      PT LONG TERM GOAL #4   Title Patient will be able to go up and down the stairs with use of one railing as needed and reciprocal gait pattern to improve overall stair mobility    Time 6    Period Weeks    Status On-going                   Plan - 07/21/21 1053     Clinical Impression Statement Slight increase in pain with weightbearing exercises. This was reduced back to base line with self mobilization techniques. Sandwiched weightbearing exercises with self mobilization techniques to reduce overall symptoms from compounding. Overall tolerated total gym exercises well with reported fatigue in legs without pain. No increase in symptoms noted end of session but fatigue in quads noted.    Personal Factors and Comorbidities Comorbidity 1;Comorbidity 2;Fitness;Profession    Comorbidities gout, overweigh, arthritis    Examination-Activity Limitations Squat;Stairs;Stand;Transfers;Locomotion Level;Lift    Examination-Participation Restrictions Occupation;Meal Prep;Laundry;Yard Work;Community Activity;Cleaning;Shop    Stability/Clinical Decision Making Stable/Uncomplicated    Rehab Potential Good    PT Frequency 2x / week    PT Duration 6 weeks    PT Treatment/Interventions ADLs/Self Care Home Management;Aquatic Therapy;Cryotherapy;Electrical Stimulation;Moist Heat;Balance training;Therapeutic  exercise;Manual techniques;Therapeutic  activities;Stair training;Gait training;DME Instruction;Neuromuscular re-education;Patient/family education;Passive range of motion;Dry needling;Joint Manipulations    PT Next Visit Plan total gym exercises, edema management, quad/LE strengthening and ROM    PT Home Exercise Plan heel slides, Quad set, SLR; 8/1 ham curls, hip ext, bridge, SAQs, prone quad set; 8/9:  Heel raise, lunge, step up sit to stand; 8/18 self mobilization, scar mobilization, patella mobs             Patient will benefit from skilled therapeutic intervention in order to improve the following deficits and impairments:  Pain, Decreased strength, Decreased activity tolerance, Difficulty walking, Decreased range of motion, Decreased balance, Abnormal gait, Decreased endurance, Decreased skin integrity, Decreased scar mobility  Visit Diagnosis: Difficulty in walking, not elsewhere classified  Chronic pain of left knee  Decreased range of motion (ROM) of left knee  Muscle weakness (generalized)     Problem List Patient Active Problem List   Diagnosis Date Noted   Mixed hyperlipidemia 07/05/2021   Lymphedema 07/05/2021   Onychomycosis 07/05/2021   Chronic pain of left knee 07/05/2021   Type 2 diabetes mellitus with morbid obesity (HCC) 06/23/2021   Gout 06/20/2021   S/P arthroscopy of left knee 05/17/21 05/25/2021   Chondral defect of condyle of left femur    Acute medial meniscus tear of left knee    Cough in adult patient 11/18/2020   Colon cancer screening 03/22/2020   Obstructive sleep apnea 01/05/2020   Morbid obesity (HCC) 10/15/2016   Allergic rhinitis 01/06/2015   Prostate hypertrophy 01/06/2015   Venous stasis dermatitis 03/23/2014   Dyspnea 05/18/2013   HTN (hypertension)    11:32 AM, 07/21/21 Tereasa Coop, DPT Physical Therapy with [facility]  [phone] office   [facility] [address] [place], [place], [address] Phone: [phone]   Fax:  [phone]  Name: James Rivas MRN: [id number] Date of Birth: 04-18-1971

## 2021-07-23 ENCOUNTER — Other Ambulatory Visit: Payer: Self-pay | Admitting: Family Medicine

## 2021-07-25 ENCOUNTER — Other Ambulatory Visit: Payer: Self-pay

## 2021-07-25 ENCOUNTER — Ambulatory Visit (HOSPITAL_COMMUNITY): Payer: BC Managed Care – PPO | Admitting: Physical Therapy

## 2021-07-25 DIAGNOSIS — M25662 Stiffness of left knee, not elsewhere classified: Secondary | ICD-10-CM | POA: Diagnosis not present

## 2021-07-25 DIAGNOSIS — M6281 Muscle weakness (generalized): Secondary | ICD-10-CM

## 2021-07-25 DIAGNOSIS — G8929 Other chronic pain: Secondary | ICD-10-CM

## 2021-07-25 DIAGNOSIS — R262 Difficulty in walking, not elsewhere classified: Secondary | ICD-10-CM | POA: Diagnosis not present

## 2021-07-25 DIAGNOSIS — M25562 Pain in left knee: Secondary | ICD-10-CM | POA: Diagnosis not present

## 2021-07-25 NOTE — Therapy (Signed)
[facility] [address] [place], [place], [address] Phone: [phone]   Fax:  [phone]  Physical Therapy Treatment Progress Note Reporting Period 06/23/2021 to 07/25/2021  See note below for Objective Data and Assessment of Progress/Goals.     Patient Details  Name: James Rivas MRN: [id number] Date of Birth: 1971-05-07 Referring Provider (PT): Arther Abbott   Encounter Date: 07/25/2021   PT End of Session - 07/25/21 1038     Visit Number 10    Number of Visits 20    Date for PT Re-Evaluation 08/04/21    Authorization Type BCBS COMM PPO no auth or VL    Progress Note Due on Visit 20    PT Start Time 1004    PT Stop Time 1045    PT Time Calculation (min) 41 min    Activity Tolerance Patient tolerated treatment well    Behavior During Therapy [facility] for tasks assessed/performed             Past Medical History:  Diagnosis Date   Allergy    Arthritis    left knee   Chest pain    Chronic chest pain 06/22/2013   Gout    Gout    History of COVID-19 12/20/2020   HTN (hypertension)    Impaired fasting glucose    Morbid obesity ([place])    Sleep apnea    uses a CPAP   Venous stasis     Past Surgical History:  Procedure Laterality Date   COLONOSCOPY WITH PROPOFOL N/A 05/11/2020   Procedure: COLONOSCOPY WITH PROPOFOL;  Surgeon: Daneil Dolin, MD;  [facility];  Service: Endoscopy;  Laterality: N/A;  2:15pm   FINGER SURGERY Left    left ring finger 2 surgeries   I & D EXTREMITY  11/29/2011   Procedure: IRRIGATION AND DEBRIDEMENT EXTREMITY;  Surgeon: Linna Hoff;  Location: [facility];  Service: Orthopedics;  Laterality: Left;  I&D Right Long and Index Fingers with Tendon Repair as Needed   KNEE ARTHROSCOPY WITH MEDIAL MENISECTOMY Left 05/17/2021   Procedure: KNEE ARTHROSCOPY WITH MICROFRACTURE;  Surgeon: Carole Civil, MD;  Location: [facility];  Service: Orthopedics;  Laterality: Left;   MENISECTOMY Left  05/17/2021   Procedure: PARTIAL MEDIAL MENISECTOMY;  Surgeon: Carole Civil, MD;  Location: [facility];  Service: Orthopedics;  Laterality: Left;    There were no vitals filed for this visit.   Subjective Assessment - 07/25/21 1006     Subjective Pt states his knee is about 30% better.  STates he knows his knee would not hold up at work for him doing 12 hour shifts as there are no light duty positions.  States it still catches pointing to medial knee.   Returns to MD 9/7.  Current pain is 2/10    Currently in Pain? Yes    Pain Score 2     Pain Location Knee    Pain Orientation Medial;Left    Pain Descriptors / Indicators Aching;Sore                [facility] PT Assessment - 07/25/21 1009       Assessment   Medical Diagnosis left knee scope with medial menisectomy and medial microfracture    Referring Provider (PT) Arther Abbott    Onset Date/Surgical Date 05/17/21    Next MD Visit 08/03/21    Prior Therapy yes for hand      [place]  Private residence    Living Arrangements Spouse/significant other    Available Help at Discharge Family    Type of [place] to enter    Entrance Stairs-Number of Steps 2    [place] One level      Prior Function   Level of Independence Independent    Vocation Full time employment      Cognition   Overall Cognitive Status Within Functional Limits for tasks assessed      Observation/Other Assessments   Observations reduced Edema from initial evaluation    Focus on Therapeutic Outcomes (FOTO)  67% functional status (was 28% functional status)      Observation/Other Assessments-Edema    Edema Circumferential      Circumferential Edema   Circumferential - Right 43.5   was 43.25 cm   Circumferential - Left  45   was 45.75     AROM   Right Knee Extension 0    Right Knee Flexion 122    Left Knee Extension 0   was lacking 7 degrees   Left Knee Flexion 115   was 105     Strength    Overall Strength Comments able to perform 5 SLR on the left with < 5 degrees of extension lag but fatigue noted    Right Knee Flexion 5/5   was not tested at initial evaluation   Right Knee Extension 5/5   was not tested at initial evaluation   Left Knee Flexion 4+/5   was not tested at initial evaluation   Left Knee Extension 4+/5   was not tested at initial evaluation     Palpation   Palpation comment tenderness to palpation to medial left knee                           [facility] Adult PT Treatment/Exercise - 07/25/21 0001       Knee/Hip Exercises: Machines for Strengthening   Total Gym Leg Press 3x10 with 75% weight on left, then 5 pulses at bottom 10 sets of 5 reps, heel raise x12 slow and controlled                      PT Short Term Goals - 07/25/21 1019       PT SHORT TERM GOAL #1   Title Patient will demonstrate at least 2-120 degrees of left knee ROM    Baseline 8/29: Lt knee 0-115    Time 3    Period Weeks    Status Partially Met    Target Date 07/14/21      PT SHORT TERM GOAL #2   Title Patient will report at least 50% improvement in overall symptoms and/or function to demonstrate improved functional mobility    Time 3    Period Weeks    Status On-going    Target Date 07/14/21      PT SHORT TERM GOAL #3   Title Patient will be independent in self management strategies to improve quality of life and functional outcomes.    Time 3    Period Weeks    Status Achieved    Target Date 07/14/21               PT Long Term Goals - 07/25/21 1020       PT LONG TERM GOAL #1   Title Patient will improve on FOTO score to meet predicted outcomes  to demonstrate improved functional mobility.    Time 6    Period Weeks    Status On-going      PT LONG TERM GOAL #2   Title Patietn will be able to walk without brace or antalgic gait for at least 5 minutes to demonstrate improved functional knee strength.    Time 6    Period Weeks    Status  Achieved      PT LONG TERM GOAL #3   Title Patient will report at least 75% improvement in overall symptoms and/or function to demonstrate improved functional mobility    Time 6    Period Weeks    Status On-going      PT LONG TERM GOAL #4   Title Patient will be able to go up and down the stairs with use of one railing as needed and reciprocal gait pattern to improve overall stair mobility    Time 6    Period Weeks    Status Achieved                   Plan - 07/25/21 1211     Clinical Impression Statement 10th visit progress noted completed this session.  Pt has met 1/3 STG's and 2/4 LTG's.  Pt's biggest issue is stair negotiation and feeling like his medial knee is going to give at times. FOTO improved nearly 40 percent and has been compliant with HEP.   Lt knee continues to be slightly swollen but ROM has improved to 0-115.  Pt feels as if PT is helping and would like to continue to improve function and meet remaining goals.    Personal Factors and Comorbidities Comorbidity 1;Comorbidity 2;Fitness;Profession    Comorbidities gout, overweigh, arthritis    Examination-Activity Limitations Squat;Stairs;Stand;Transfers;Locomotion Level;Lift    Examination-Participation Restrictions Occupation;Meal Prep;Laundry;Yard Work;Community Activity;Cleaning;Shop    Stability/Clinical Decision Making Stable/Uncomplicated    Rehab Potential Good    PT Frequency 2x / week    PT Duration 4 weeks    PT Treatment/Interventions ADLs/Self Care Home Management;Aquatic Therapy;Cryotherapy;Electrical Stimulation;Moist Heat;Balance training;Therapeutic exercise;Manual techniques;Therapeutic activities;Stair training;Gait training;DME Instruction;Neuromuscular re-education;Patient/family education;Passive range of motion;Dry needling;Joint Manipulations    PT Next Visit Plan total gym exercises, edema management, quad/LE strengthening and ROM.  continue X 4 more weeks    PT Home Exercise Plan heel  slides, Quad set, SLR; 8/1 ham curls, hip ext, bridge, SAQs, prone quad set; 8/9:  Heel raise, lunge, step up sit to stand; 8/18 self mobilization, scar mobilization, patella mobs             Patient will benefit from skilled therapeutic intervention in order to improve the following deficits and impairments:  Pain, Decreased strength, Decreased activity tolerance, Difficulty walking, Decreased range of motion, Decreased balance, Abnormal gait, Decreased endurance, Decreased skin integrity, Decreased scar mobility  Visit Diagnosis: Difficulty in walking, not elsewhere classified  Chronic pain of left knee  Decreased range of motion (ROM) of left knee  Muscle weakness (generalized)     Problem List Patient Active Problem List   Diagnosis Date Noted   Mixed hyperlipidemia 07/05/2021   Lymphedema 07/05/2021   Onychomycosis 07/05/2021   Chronic pain of left knee 07/05/2021   Type 2 diabetes mellitus with morbid obesity ([place]) 06/23/2021   Gout 06/20/2021   S/P arthroscopy of left knee 05/17/21 05/25/2021   Chondral defect of condyle of left femur    Acute medial meniscus tear of left knee    Cough in adult patient 11/18/2020  Colon cancer screening 03/22/2020   Obstructive sleep apnea 01/05/2020   Morbid obesity ([place]) 10/15/2016   Allergic rhinitis 01/06/2015   Prostate hypertrophy 01/06/2015   Venous stasis dermatitis 03/23/2014   Dyspnea 05/18/2013   HTN (hypertension)    Teena Irani, PTA/CLT [phone]  Teena Irani 07/25/2021, 12:12 PM  [facility] [address] [place], [place], [address] Phone: [phone]   Fax:  [phone]  Name: BRECKEN DEWOODY MRN: [id number] Date of Birth: September 08, 1971

## 2021-07-25 NOTE — Addendum Note (Signed)
Addended by: Tereasa Coop R on: 07/25/2021 01:27 PM   Modules accepted: Orders

## 2021-07-26 ENCOUNTER — Ambulatory Visit: Payer: BC Managed Care – PPO | Admitting: Internal Medicine

## 2021-07-27 ENCOUNTER — Ambulatory Visit (HOSPITAL_COMMUNITY): Payer: BC Managed Care – PPO | Admitting: Physical Therapy

## 2021-07-27 ENCOUNTER — Other Ambulatory Visit: Payer: Self-pay

## 2021-07-27 DIAGNOSIS — G8929 Other chronic pain: Secondary | ICD-10-CM

## 2021-07-27 DIAGNOSIS — M25562 Pain in left knee: Secondary | ICD-10-CM | POA: Diagnosis not present

## 2021-07-27 DIAGNOSIS — M6281 Muscle weakness (generalized): Secondary | ICD-10-CM

## 2021-07-27 DIAGNOSIS — R262 Difficulty in walking, not elsewhere classified: Secondary | ICD-10-CM | POA: Diagnosis not present

## 2021-07-27 DIAGNOSIS — M25662 Stiffness of left knee, not elsewhere classified: Secondary | ICD-10-CM

## 2021-07-27 NOTE — Therapy (Signed)
[place], [place], [address] Phone: [phone]   Fax:  [phone]  Physical Therapy Treatment  Patient Details  Name: James Rivas MRN: [id number] Date of Birth: [birth year] Referring Provider (PT): Arther Abbott   Encounter Date: 07/27/2021   PT End of Session - 07/27/21 1312     Visit Number 11    Number of Visits 20    Date for PT Re-Evaluation 09/01/21    Authorization Type BCBS COMM PPO no auth or VL    Progress Note Due on Visit 20    PT Start Time 1315    PT Stop Time 1355    PT Time Calculation (min) 40 min    Activity Tolerance Patient tolerated treatment well    Behavior During Therapy [facility] for tasks assessed/performed             Past Medical History:  Diagnosis Date   Allergy    Arthritis    left knee   Chest pain    Chronic chest pain 06/22/2013   Gout    Gout    History of COVID-19 12/20/2020   HTN (hypertension)    Impaired fasting glucose    Morbid obesity ([place])    Sleep apnea    uses a CPAP   Venous stasis     Past Surgical History:  Procedure Laterality Date   COLONOSCOPY WITH PROPOFOL N/A 05/11/2020   Procedure: COLONOSCOPY WITH PROPOFOL;  Surgeon: Daneil Dolin, MD;  [facility];  Service: Endoscopy;  Laterality: N/A;  2:15pm   FINGER SURGERY Left    left ring finger 2 surgeries   I & D EXTREMITY  11/29/2011   Procedure: IRRIGATION AND DEBRIDEMENT EXTREMITY;  Surgeon: Linna Hoff;  Location: [facility];  Service: Orthopedics;  Laterality: Left;  I&D Right Long and Index Fingers with Tendon Repair as Needed   KNEE ARTHROSCOPY WITH MEDIAL MENISECTOMY Left 05/17/2021   Procedure: KNEE ARTHROSCOPY WITH MICROFRACTURE;  Surgeon: Carole Civil, MD;  [facility];  Service: Orthopedics;  Laterality: Left;   MENISECTOMY Left 05/17/2021   Procedure: PARTIAL MEDIAL MENISECTOMY;  Surgeon: Carole Civil, MD;  [facility];  Service: Orthopedics;   Laterality: Left;    There were no vitals filed for this visit.   Subjective Assessment - 07/27/21 1308     Subjective Pt states that he is doing his exercises at home; states that he is having soreness today    Pertinent History gout, arthritis    Limitations Standing;Walking;Lifting;House hold activities    Patient Stated Goals to get his knee 85% of what it was    Currently in Pain? Yes    Pain Score 4     Pain Location Knee    Pain Orientation Left;Medial    Pain Descriptors / Indicators Aching    Pain Type Chronic pain    Pain Onset More than a month ago    Pain Frequency Intermittent    Aggravating Factors  bending and wB    Pain Relieving Factors ice                               [facility] Adult PT Treatment/Exercise - 07/27/21 0001       Exercises   Exercises Knee/Hip      Knee/Hip Exercises: Stretches   Passive Hamstring Stretch Left;3 reps;30 seconds    Quad Stretch Left;3 reps;30  seconds    Knee: Self-Stretch to increase Flexion Left;20 seconds;Limitations    Knee: Self-Stretch Limitations 12in step height knee drive for flexion 10 reps      Knee/Hip Exercises: Machines for Strengthening   Total Gym Leg Press 3x10 with 75% weight on left, then 5 pulses at bottom 3x5, heel raise x12 slow and controlled   level 28     Knee/Hip Exercises: Standing   Heel Raises Both;15 reps    Heel Raises Limitations toe raises x 10    Knee Flexion Left;15 reps    Knee Flexion Limitations 5#    Forward Lunges Left;15 reps    Lateral Step Up Left;10 reps;Hand Hold: 2;Step Height: 6"    Forward Step Up 15 reps;Step Height: 6";Both    Functional Squat 10 reps    Wall Squat 10 reps    Rocker Board 2 minutes    SLS x 3 B ; RT: 10" Lt 21"                      PT Short Term Goals - 07/25/21 1019       PT SHORT TERM GOAL #1   Title Patient will demonstrate at least 2-120 degrees of left knee ROM    Baseline 8/29: Lt knee 0-115    Time 3     Period Weeks    Status Partially Met    Target Date 07/14/21      PT SHORT TERM GOAL #2   Title Patient will report at least 50% improvement in overall symptoms and/or function to demonstrate improved functional mobility    Time 3    Period Weeks    Status On-going    Target Date 07/14/21      PT SHORT TERM GOAL #3   Title Patient will be independent in self management strategies to improve quality of life and functional outcomes.    Time 3    Period Weeks    Status Achieved    Target Date 07/14/21               PT Long Term Goals - 07/25/21 1020       PT LONG TERM GOAL #1   Title Patient will improve on FOTO score to meet predicted outcomes to demonstrate improved functional mobility.    Time 6    Period Weeks    Status On-going      PT LONG TERM GOAL #2   Title Patietn will be able to walk without brace or antalgic gait for at least 5 minutes to demonstrate improved functional knee strength.    Time 6    Period Weeks    Status Achieved      PT LONG TERM GOAL #3   Title Patient will report at least 75% improvement in overall symptoms and/or function to demonstrate improved functional mobility    Time 6    Period Weeks    Status On-going      PT LONG TERM GOAL #4   Title Patient will be able to go up and down the stairs with use of one railing as needed and reciprocal gait pattern to improve overall stair mobility    Time 6    Period Weeks    Status Achieved                   Plan - 07/27/21 1358     Clinical Impression Statement Pt able to complete rocker board, hamstring curls  with wt and increase height of leg press today for improved strengthening.  Added quad stretch as well to increase ROM    Personal Factors and Comorbidities Comorbidity 1;Comorbidity 2;Fitness;Profession    Comorbidities gout, overweigh, arthritis    Examination-Activity Limitations Squat;Stairs;Stand;Transfers;Locomotion Level;Lift    Examination-Participation Restrictions  Occupation;Meal Prep;Laundry;Yard Work;Community Activity;Cleaning;Shop    Stability/Clinical Decision Making Stable/Uncomplicated    Rehab Potential Good    PT Frequency 2x / week    PT Duration 4 weeks    PT Treatment/Interventions ADLs/Self Care Home Management;Aquatic Therapy;Cryotherapy;Electrical Stimulation;Moist Heat;Balance training;Therapeutic exercise;Manual techniques;Therapeutic activities;Stair training;Gait training;DME Instruction;Neuromuscular re-education;Patient/family education;Passive range of motion;Dry needling;Joint Manipulations    PT Next Visit Plan total gym exercises, edema management, quad/LE strengthening and ROM.  continue X 4 more weeks    PT Home Exercise Plan heel slides, Quad set, SLR; 8/1 ham curls, hip ext, bridge, SAQs, prone quad set; 8/9:  Heel raise, lunge, step up sit to stand; 8/18 self mobilization, scar mobilization, patella mobs             Patient will benefit from skilled therapeutic intervention in order to improve the following deficits and impairments:  Pain, Decreased strength, Decreased activity tolerance, Difficulty walking, Decreased range of motion, Decreased balance, Abnormal gait, Decreased endurance, Decreased skin integrity, Decreased scar mobility  Visit Diagnosis: Difficulty in walking, not elsewhere classified  Chronic pain of left knee  Decreased range of motion (ROM) of left knee  Muscle weakness (generalized)     Problem List Patient Active Problem List   Diagnosis Date Noted   Mixed hyperlipidemia 07/05/2021   Lymphedema 07/05/2021   Onychomycosis 07/05/2021   Chronic pain of left knee 07/05/2021   Type 2 diabetes mellitus with morbid obesity ([place]) 06/23/2021   Gout 06/20/2021   S/P arthroscopy of left knee 05/17/21 05/25/2021   Chondral defect of condyle of left femur    Acute medial meniscus tear of left knee    Cough in adult patient 11/18/2020   Colon cancer screening 03/22/2020   Obstructive sleep apnea  01/05/2020   Morbid obesity ([place]) 10/15/2016   Allergic rhinitis 01/06/2015   Prostate hypertrophy 01/06/2015   Venous stasis dermatitis 03/23/2014   Dyspnea 05/18/2013   HTN (hypertension)   Rayetta Humphrey, PT CLT [phone]  07/27/2021, 2:00 PM  [place] [address] [place], [place], [address] Phone: [phone]   Fax:  [phone]  Name: James Rivas MRN: [id number] Date of Birth: Jul 22, 1971

## 2021-08-02 ENCOUNTER — Other Ambulatory Visit: Payer: Self-pay

## 2021-08-02 ENCOUNTER — Encounter (HOSPITAL_COMMUNITY): Payer: Self-pay | Admitting: Physical Therapy

## 2021-08-02 ENCOUNTER — Ambulatory Visit (HOSPITAL_COMMUNITY): Payer: BC Managed Care – PPO | Attending: Orthopedic Surgery | Admitting: Physical Therapy

## 2021-08-02 DIAGNOSIS — M25662 Stiffness of left knee, not elsewhere classified: Secondary | ICD-10-CM

## 2021-08-02 DIAGNOSIS — M6281 Muscle weakness (generalized): Secondary | ICD-10-CM | POA: Insufficient documentation

## 2021-08-02 DIAGNOSIS — M25562 Pain in left knee: Secondary | ICD-10-CM | POA: Diagnosis not present

## 2021-08-02 DIAGNOSIS — R262 Difficulty in walking, not elsewhere classified: Secondary | ICD-10-CM | POA: Insufficient documentation

## 2021-08-02 DIAGNOSIS — G8929 Other chronic pain: Secondary | ICD-10-CM | POA: Insufficient documentation

## 2021-08-02 NOTE — Therapy (Signed)
[place], [place], [address] Phone: [phone]   Fax:  [phone]  Physical Therapy Treatment  Patient Details  Name: James Rivas MRN: [id number] Date of Birth: [birth year] Referring Provider (PT): Arther Abbott   LEFT KNEE ROM 0-116  Encounter Date: 08/02/2021   PT End of Session - 08/02/21 1046     Visit Number 12    Number of Visits 20    Date for PT Re-Evaluation 09/01/21    Authorization Type BCBS COMM PPO no auth or VL    Progress Note Due on Visit 20    PT Start Time 1046    PT Stop Time 1125    PT Time Calculation (min) 39 min    Activity Tolerance Patient tolerated treatment well    Behavior During Therapy [facility] for tasks assessed/performed             Past Medical History:  Diagnosis Date   Allergy    Arthritis    left knee   Chest pain    Chronic chest pain 06/22/2013   Gout    Gout    History of COVID-19 12/20/2020   HTN (hypertension)    Impaired fasting glucose    Morbid obesity ([place])    Sleep apnea    uses a CPAP   Venous stasis     Past Surgical History:  Procedure Laterality Date   COLONOSCOPY WITH PROPOFOL N/A 05/11/2020   Procedure: COLONOSCOPY WITH PROPOFOL;  Surgeon: Daneil Dolin, MD;  [facility];  Service: Endoscopy;  Laterality: N/A;  2:15pm   FINGER SURGERY Left    left ring finger 2 surgeries   I & D EXTREMITY  11/29/2011   Procedure: IRRIGATION AND DEBRIDEMENT EXTREMITY;  Surgeon: Linna Hoff;  Location: [place];  Service: Orthopedics;  Laterality: Left;  I&D Right Long and Index Fingers with Tendon Repair as Needed   KNEE ARTHROSCOPY WITH MEDIAL MENISECTOMY Left 05/17/2021   Procedure: KNEE ARTHROSCOPY WITH MICROFRACTURE;  Surgeon: Carole Civil, MD;  [facility];  Service: Orthopedics;  Laterality: Left;   MENISECTOMY Left 05/17/2021   Procedure: PARTIAL MEDIAL MENISECTOMY;  Surgeon: Carole Civil, MD;  [facility];  Service:  Orthopedics;  Laterality: Left;    There were no vitals filed for this visit.   Subjective Assessment - 08/02/21 1050     Subjective States that he had therapy on Wednesday and then Thursday and the entire weekend he has had increased pain to the point where he felt like he had to get his walker out again. Reports this was the pain he had prior to the surgery. Current pain is 5/10 and over the weekend it was about8/10. States he iced a bit. States he follows up with MD tomorrow.    Currently in Pain? Yes    Pain Score 5     Pain Location Knee    Pain Orientation Left;Medial    Pain Descriptors / Indicators Aching;Throbbing    Pain Type Chronic pain                [facility] PT Assessment - 08/02/21 0001       Assessment   Medical Diagnosis left knee scope with medial menisectomy and medial microfracture    Referring Provider (PT) Arther Abbott    Onset Date/Surgical Date 05/17/21    Next MD Visit 08/03/21  [place] Adult PT Treatment/Exercise - 08/02/21 0001       Knee/Hip Exercises: Stretches   Passive Hamstring Stretch Left;3 reps;30 seconds    Passive Hamstring Stretch Limitations seated      Knee/Hip Exercises: Seated   Long Arc Quad --   2 minutes left   Other Seated Knee/Hip Exercises knee traction with ankle weight 10# 6 minutes x2      Knee/Hip Exercises: Supine   Knee Extension AROM    Knee Extension Limitations 0    Knee Flexion AROM    Knee Flexion Limitations 116      Manual Therapy   Manual Therapy Soft tissue mobilization;Manual Traction    Manual therapy comments all manual interventions performed independently of other interventions    Soft tissue mobilization STM to medial knee    Manual Traction left knee traction - osccilating for 3 minutes                    PT Education - 08/02/21 1113     Education Details answered all questions about current presentation, what to do when in pain, how to  manage pain and how over doing things at home can increase pain.    Person(s) Educated Patient    Methods Explanation    Comprehension Verbalized understanding              PT Short Term Goals - 08/02/21 1123       PT SHORT TERM GOAL #1   Title Patient will demonstrate at least 2-120 degrees of left knee ROM    Baseline 8/29: Lt knee 0-116    Time 3    Period Weeks    Status Partially Met    Target Date 07/14/21      PT SHORT TERM GOAL #2   Title Patient will report at least 50% improvement in overall symptoms and/or function to demonstrate improved functional mobility    Baseline reports he feels between 25-40% better depends on the  day    Time 3    Period Weeks    Status On-going    Target Date 07/14/21      PT SHORT TERM GOAL #3   Title Patient will be independent in self management strategies to improve quality of life and functional outcomes.    Time 3    Period Weeks    Status Achieved    Target Date 07/14/21               PT Long Term Goals - 07/25/21 1020       PT LONG TERM GOAL #1   Title Patient will improve on FOTO score to meet predicted outcomes to demonstrate improved functional mobility.    Time 6    Period Weeks    Status On-going      PT LONG TERM GOAL #2   Title Patietn will be able to walk without brace or antalgic gait for at least 5 minutes to demonstrate improved functional knee strength.    Time 6    Period Weeks    Status Achieved      PT LONG TERM GOAL #3   Title Patient will report at least 75% improvement in overall symptoms and/or function to demonstrate improved functional mobility    Time 6    Period Weeks    Status On-going      PT LONG TERM GOAL #4   Title Patient will be able to go up and down the stairs  with use of one railing as needed and reciprocal gait pattern to improve overall stair mobility    Time 6    Period Weeks    Status Achieved                   Plan - 08/02/21 1137     Clinical  Impression Statement Session focused on pain management and all symptoms able to be resolved with knee traction which patient was educated on. 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 Concerned that he needs to be 80% better to do his job and is still having good and bad days. Discussed adding traction into daily routine and benefits of this. Answered all questions and discussed return to more challenging exercises for HEP but to "sandwich" them with his traction to help from pain compounding on itself.    Personal Factors and Comorbidities Comorbidity 1;Comorbidity 2;Fitness;Profession    Comorbidities gout, overweigh, arthritis    Examination-Activity Limitations Squat;Stairs;Stand;Transfers;Locomotion Level;Lift    Examination-Participation Restrictions Occupation;Meal Prep;Laundry;Yard Work;Community Activity;Cleaning;Shop    Stability/Clinical Decision Making Stable/Uncomplicated    Rehab Potential Good    PT Frequency 2x / week    PT Duration 4 weeks    PT Treatment/Interventions ADLs/Self Care Home Management;Aquatic Therapy;Cryotherapy;Electrical Stimulation;Moist Heat;Balance training;Therapeutic exercise;Manual techniques;Therapeutic activities;Stair training;Gait training;DME Instruction;Neuromuscular re-education;Patient/family education;Passive range of motion;Dry needling;Joint Manipulations    PT Next Visit Plan traction for pain relief. total gym exercises, edema management, quad/LE strengthening and ROM.  continue X 4 more weeks    PT Home Exercise Plan heel slides, Quad set, SLR; 8/1 ham curls, hip ext, bridge, SAQs, prone quad set; 8/9:  Heel raise, lunge, step up sit to stand; 8/18 self mobilization, scar mobilization, patella mobs; 9/6 self traction             Patient will benefit from skilled therapeutic intervention in order to improve the following deficits and impairments:  Pain, Decreased  strength, Decreased activity tolerance, Difficulty walking, Decreased range of motion, Decreased balance, Abnormal gait, Decreased endurance, Decreased skin integrity, Decreased scar mobility  Visit Diagnosis: Difficulty in walking, not elsewhere classified  Chronic pain of left knee  Decreased range of motion (ROM) of left knee  Muscle weakness (generalized)     Problem List Patient Active Problem List   Diagnosis Date Noted   Mixed hyperlipidemia 07/05/2021   Lymphedema 07/05/2021   Onychomycosis 07/05/2021   Chronic pain of left knee 07/05/2021   Type 2 diabetes mellitus with morbid obesity ([place]) 06/23/2021   Gout 06/20/2021   S/P arthroscopy of left knee 05/17/21 05/25/2021   Chondral defect of condyle of left femur    Acute medial meniscus tear of left knee    Cough in adult patient 11/18/2020   Colon cancer screening 03/22/2020   Obstructive sleep apnea 01/05/2020   Morbid obesity ([place]) 10/15/2016   Allergic rhinitis 01/06/2015   Prostate hypertrophy 01/06/2015   Venous stasis dermatitis 03/23/2014   Dyspnea 05/18/2013   HTN (hypertension)    11:39 AM, 08/02/21 Jerene Pitch, DPT Physical Therapy with [facility]  [phone] office   [place] [address] [place], [place], [address] Phone: [phone]   Fax:  [phone]  Name: James Rivas MRN: [id number] Date of Birth: 12-31-1970

## 2021-08-03 ENCOUNTER — Ambulatory Visit (INDEPENDENT_AMBULATORY_CARE_PROVIDER_SITE_OTHER): Payer: BC Managed Care – PPO | Admitting: Orthopedic Surgery

## 2021-08-03 ENCOUNTER — Encounter: Payer: Self-pay | Admitting: Orthopedic Surgery

## 2021-08-03 DIAGNOSIS — M23321 Other meniscus derangements, posterior horn of medial meniscus, right knee: Secondary | ICD-10-CM

## 2021-08-03 DIAGNOSIS — Z9889 Other specified postprocedural states: Secondary | ICD-10-CM

## 2021-08-03 DIAGNOSIS — M238X2 Other internal derangements of left knee: Secondary | ICD-10-CM

## 2021-08-03 NOTE — Progress Notes (Signed)
Chief Complaint  Patient presents with   Post-op Follow-up   Date of surgery May 17, 2021  Encounter Diagnoses  Name Primary?   S/P arthroscopy of left knee 05/17/21 Yes   Derangement of posterior horn of medial meniscus of right knee    Chondral defect of condyle of left femur    Procedure arthroscopy left knee microfracture medial femoral condyle, partial medial meniscectomy this is postop visit number 4 The postop days number # [id number] (11 WEEKS)   James Rivas is doing well except he had a setback after therapy last Wednesday had a giving way episode in his left knee no significant pain at this time  Exam shows the knee is stable he has no tenderness over the medial femoral condyle he has good quadricep extension  Recommend he continue therapy continue the hinged bracing and follow-up at the end of the month

## 2021-08-04 ENCOUNTER — Ambulatory Visit (HOSPITAL_COMMUNITY): Payer: BC Managed Care – PPO | Admitting: Physical Therapy

## 2021-08-04 ENCOUNTER — Other Ambulatory Visit: Payer: Self-pay

## 2021-08-04 ENCOUNTER — Encounter (HOSPITAL_COMMUNITY): Payer: Self-pay | Admitting: Physical Therapy

## 2021-08-04 DIAGNOSIS — M25562 Pain in left knee: Secondary | ICD-10-CM | POA: Diagnosis not present

## 2021-08-04 DIAGNOSIS — M25662 Stiffness of left knee, not elsewhere classified: Secondary | ICD-10-CM | POA: Diagnosis not present

## 2021-08-04 DIAGNOSIS — M6281 Muscle weakness (generalized): Secondary | ICD-10-CM | POA: Diagnosis not present

## 2021-08-04 DIAGNOSIS — R262 Difficulty in walking, not elsewhere classified: Secondary | ICD-10-CM | POA: Diagnosis not present

## 2021-08-04 DIAGNOSIS — G8929 Other chronic pain: Secondary | ICD-10-CM | POA: Diagnosis not present

## 2021-08-04 NOTE — Therapy (Signed)
[place], [place], [address] Phone: [phone]   Fax:  [phone]  Physical Therapy Treatment  Patient Details  Name: James Rivas MRN: [id number] Date of Birth: January 27, 1971 Referring Provider (PT): Arther Abbott   Encounter Date: 08/04/2021   PT End of Session - 08/04/21 0824     Visit Number 13    Number of Visits 20    Date for PT Re-Evaluation 09/01/21    Authorization Type BCBS COMM PPO no auth or VL    Progress Note Due on Visit 20    PT Start Time 0828    PT Stop Time 0908    PT Time Calculation (min) 40 min    Activity Tolerance Patient tolerated treatment well    Behavior During Therapy [facility] for tasks assessed/performed             Past Medical History:  Diagnosis Date   Allergy    Arthritis    left knee   Chest pain    Chronic chest pain 06/22/2013   Gout    Gout    History of COVID-19 12/20/2020   HTN (hypertension)    Impaired fasting glucose    Morbid obesity ([place])    Sleep apnea    uses a CPAP   Venous stasis     Past Surgical History:  Procedure Laterality Date   COLONOSCOPY WITH PROPOFOL N/A 05/11/2020   Procedure: COLONOSCOPY WITH PROPOFOL;  Surgeon: Daneil Dolin, MD;  [facility];  Service: Endoscopy;  Laterality: N/A;  2:15pm   FINGER SURGERY Left    left ring finger 2 surgeries   I & D EXTREMITY  11/29/2011   Procedure: IRRIGATION AND DEBRIDEMENT EXTREMITY;  Surgeon: Linna Hoff;  Location: [place];  Service: Orthopedics;  Laterality: Left;  I&D Right Long and Index Fingers with Tendon Repair as Needed   KNEE ARTHROSCOPY WITH MEDIAL MENISECTOMY Left 05/17/2021   Procedure: KNEE ARTHROSCOPY WITH MICROFRACTURE;  Surgeon: Carole Civil, MD;  [facility];  Service: Orthopedics;  Laterality: Left;   MENISECTOMY Left 05/17/2021   Procedure: PARTIAL MEDIAL MENISECTOMY;  Surgeon: Carole Civil, MD;  [facility];  Service: Orthopedics;  Laterality:  Left;    There were no vitals filed for this visit.   Subjective Assessment - 08/04/21 0831     Subjective No pain. Used ankle weight for traction and it has been helping. Reports he saw the MD who said  his knee just needs a little more time to heal.    Currently in Pain? No/denies                [facility] PT Assessment - 08/04/21 0001       Assessment   Medical Diagnosis left knee scope with medial menisectomy and medial microfracture    Referring Provider (PT) Arther Abbott    Onset Date/Surgical Date 05/17/21    Next MD Visit 08/25/21                           [facility] Adult PT Treatment/Exercise - 08/04/21 0001       Knee/Hip Exercises: Stretches   Gastroc Stretch Left;3 reps;30 seconds   slant board     Knee/Hip Exercises: Aerobic   Elliptical 2 minutes as warmup      Knee/Hip Exercises: Machines for Strengthening   Total Gym Leg Press 3x20 with 75% weight on left at  level 26 - slow lower; heel raises bilateral x24      Knee/Hip Exercises: Standing   Other Standing Knee Exercises weighted suitcase carry left 35# KB on left - 50 feet 3x3                       PT Short Term Goals - 08/02/21 1123       PT SHORT TERM GOAL #1   Title Patient will demonstrate at least 2-120 degrees of left knee ROM    Baseline 8/29: Lt knee 0-116    Time 3    Period Weeks    Status Partially Met    Target Date 07/14/21      PT SHORT TERM GOAL #2   Title Patient will report at least 50% improvement in overall symptoms and/or function to demonstrate improved functional mobility    Baseline reports he feels between 25-40% better depends on the  day    Time 3    Period Weeks    Status On-going    Target Date 07/14/21      PT SHORT TERM GOAL #3   Title Patient will be independent in self management strategies to improve quality of life and functional outcomes.    Time 3    Period Weeks    Status Achieved    Target Date 07/14/21                PT Long Term Goals - 07/25/21 1020       PT LONG TERM GOAL #1   Title Patient will improve on FOTO score to meet predicted outcomes to demonstrate improved functional mobility.    Time 6    Period Weeks    Status On-going      PT LONG TERM GOAL #2   Title Patietn will be able to walk without brace or antalgic gait for at least 5 minutes to demonstrate improved functional knee strength.    Time 6    Period Weeks    Status Achieved      PT LONG TERM GOAL #3   Title Patient will report at least 75% improvement in overall symptoms and/or function to demonstrate improved functional mobility    Time 6    Period Weeks    Status On-going      PT LONG TERM GOAL #4   Title Patient will be able to go up and down the stairs with use of one railing as needed and reciprocal gait pattern to improve overall stair mobility    Time 6    Period Weeks    Status Achieved                   Plan - 08/04/21 0906     Clinical Impression Statement Reduced pain noted during session. Focused on strengthening with double leg activities and reduced body weight exercises. Able to perform suitcase carry with 35# Kettlebell without difficulties. Fatigue in legs noted end of session. No pain noted end of session. will continue with current POC.    Personal Factors and Comorbidities Comorbidity 1;Comorbidity 2;Fitness;Profession    Comorbidities gout, overweigh, arthritis    Examination-Activity Limitations Squat;Stairs;Stand;Transfers;Locomotion Level;Lift    Examination-Participation Restrictions Occupation;Meal Prep;Laundry;Yard Work;Community Activity;Cleaning;Shop    Stability/Clinical Decision Making Stable/Uncomplicated    Rehab Potential Good    PT Frequency 2x / week    PT Duration 4 weeks    PT Treatment/Interventions ADLs/Self Care Home Management;Aquatic Therapy;Cryotherapy;Electrical Stimulation;Moist Heat;Balance training;Therapeutic exercise;Manual  techniques;Therapeutic  activities;Stair training;Gait training;DME Instruction;Neuromuscular re-education;Patient/family education;Passive range of motion;Dry needling;Joint Manipulations    PT Next Visit Plan traction for pain relief. total gym exercises, edema management, quad/LE strengthening and ROM.  continue X 4 more weeks    PT Home Exercise Plan heel slides, Quad set, SLR; 8/1 ham curls, hip ext, bridge, SAQs, prone quad set; 8/9:  Heel raise, lunge, step up sit to stand; 8/18 self mobilization, scar mobilization, patella mobs; 9/6 self traction             Patient will benefit from skilled therapeutic intervention in order to improve the following deficits and impairments:  Pain, Decreased strength, Decreased activity tolerance, Difficulty walking, Decreased range of motion, Decreased balance, Abnormal gait, Decreased endurance, Decreased skin integrity, Decreased scar mobility  Visit Diagnosis: Difficulty in walking, not elsewhere classified  Chronic pain of left knee  Decreased range of motion (ROM) of left knee  Muscle weakness (generalized)     Problem List Patient Active Problem List   Diagnosis Date Noted   Mixed hyperlipidemia 07/05/2021   Lymphedema 07/05/2021   Onychomycosis 07/05/2021   Chronic pain of left knee 07/05/2021   Type 2 diabetes mellitus with morbid obesity ([place]) 06/23/2021   Gout 06/20/2021   S/P arthroscopy of left knee 05/17/21 05/25/2021   Chondral defect of condyle of left femur    Acute medial meniscus tear of left knee    Cough in adult patient 11/18/2020   Colon cancer screening 03/22/2020   Obstructive sleep apnea 01/05/2020   Morbid obesity ([place]) 10/15/2016   Allergic rhinitis 01/06/2015   Prostate hypertrophy 01/06/2015   Venous stasis dermatitis 03/23/2014   Dyspnea 05/18/2013   HTN (hypertension)    9:16 AM, 08/04/21 Jerene Pitch, DPT Physical Therapy with [facility]  [phone] office   [place] [address] [place], [place], [address] Phone: [phone]   Fax:  [phone]  Name: James Rivas MRN: [id number] Date of Birth: 1970/12/08

## 2021-08-08 ENCOUNTER — Other Ambulatory Visit: Payer: Self-pay

## 2021-08-08 ENCOUNTER — Ambulatory Visit (HOSPITAL_COMMUNITY): Payer: BC Managed Care – PPO | Admitting: Physical Therapy

## 2021-08-08 ENCOUNTER — Encounter (HOSPITAL_COMMUNITY): Payer: Self-pay | Admitting: Physical Therapy

## 2021-08-08 DIAGNOSIS — M6281 Muscle weakness (generalized): Secondary | ICD-10-CM | POA: Diagnosis not present

## 2021-08-08 DIAGNOSIS — M25562 Pain in left knee: Secondary | ICD-10-CM | POA: Diagnosis not present

## 2021-08-08 DIAGNOSIS — M25662 Stiffness of left knee, not elsewhere classified: Secondary | ICD-10-CM | POA: Diagnosis not present

## 2021-08-08 DIAGNOSIS — R262 Difficulty in walking, not elsewhere classified: Secondary | ICD-10-CM

## 2021-08-08 DIAGNOSIS — G8929 Other chronic pain: Secondary | ICD-10-CM

## 2021-08-08 NOTE — Therapy (Signed)
[place], [place], [address] Phone: [phone]   Fax:  [phone]  Physical Therapy Treatment  Patient Details  Name: James Rivas MRN: [id number] Date of Birth: 01-23-1971 Referring Provider (PT): Arther Abbott   Encounter Date: 08/08/2021   PT End of Session - 08/08/21 0954     Visit Number 14    Number of Visits 20    Date for PT Re-Evaluation 09/01/21    Authorization Type BCBS COMM PPO no auth or VL    Progress Note Due on Visit 20    PT Start Time 0955    PT Stop Time 1035    PT Time Calculation (min) 40 min    Activity Tolerance Patient tolerated treatment well    Behavior During Therapy [facility] for tasks assessed/performed             Past Medical History:  Diagnosis Date   Allergy    Arthritis    left knee   Chest pain    Chronic chest pain 06/22/2013   Gout    Gout    History of COVID-19 12/20/2020   HTN (hypertension)    Impaired fasting glucose    Morbid obesity ([place])    Sleep apnea    uses a CPAP   Venous stasis     Past Surgical History:  Procedure Laterality Date   COLONOSCOPY WITH PROPOFOL N/A 05/11/2020   Procedure: COLONOSCOPY WITH PROPOFOL;  Surgeon: Daneil Dolin, MD;  [facility];  Service: Endoscopy;  Laterality: N/A;  2:15pm   FINGER SURGERY Left    left ring finger 2 surgeries   I & D EXTREMITY  11/29/2011   Procedure: IRRIGATION AND DEBRIDEMENT EXTREMITY;  Surgeon: Linna Hoff;  Location: [place];  Service: Orthopedics;  Laterality: Left;  I&D Right Long and Index Fingers with Tendon Repair as Needed   KNEE ARTHROSCOPY WITH MEDIAL MENISECTOMY Left 05/17/2021   Procedure: KNEE ARTHROSCOPY WITH MICROFRACTURE;  Surgeon: Carole Civil, MD;  [facility];  Service: Orthopedics;  Laterality: Left;   MENISECTOMY Left 05/17/2021   Procedure: PARTIAL MEDIAL MENISECTOMY;  Surgeon: Carole Civil, MD;  [facility];  Service: Orthopedics;   Laterality: Left;    There were no vitals filed for this visit.   Subjective Assessment - 08/08/21 0958     Subjective Reports no current pain. states he has been using his ankle weight and it seems to be helping. Reports no difficulty with exercises at home.    Currently in Pain? No/denies                [facility] PT Assessment - 08/08/21 0001       Assessment   Medical Diagnosis left knee scope with medial menisectomy and medial microfracture    Referring Provider (PT) Arther Abbott    Onset Date/Surgical Date 05/17/21    Next MD Visit 08/25/21                           [place] Adult PT Treatment/Exercise - 08/08/21 0001       Knee/Hip Exercises: Aerobic   Elliptical 2 minutes as warmup      Knee/Hip Exercises: Machines for Strengthening   Total Gym Leg Press x20 with 75% weight on left at level 28, then level 30 at 2x20 - slow lower; heel raises bilateral x24 at level 30      Knee/Hip  Exercises: Standing   Functional Squat 3 sets;5 reps;5 seconds   focus on form - verbal cues and prior demo   Other Standing Knee Exercises farmers carries 35# KB L and 20# KB R - cues to not sway laterally - 3x3 laps of 50 feet    Other Standing Knee Exercises deadlift to elevated surface with 70# - height of 24 inche box  3x4 -verbal cues for form                       PT Short Term Goals - 08/02/21 1123       PT SHORT TERM GOAL #1   Title Patient will demonstrate at least 2-120 degrees of left knee ROM    Baseline 8/29: Lt knee 0-116    Time 3    Period Weeks    Status Partially Met    Target Date 07/14/21      PT SHORT TERM GOAL #2   Title Patient will report at least 50% improvement in overall symptoms and/or function to demonstrate improved functional mobility    Baseline reports he feels between 25-40% better depends on the  day    Time 3    Period Weeks    Status On-going    Target Date 07/14/21      PT SHORT TERM GOAL #3   Title Patient  will be independent in self management strategies to improve quality of life and functional outcomes.    Time 3    Period Weeks    Status Achieved    Target Date 07/14/21               PT Long Term Goals - 07/25/21 1020       PT LONG TERM GOAL #1   Title Patient will improve on FOTO score to meet predicted outcomes to demonstrate improved functional mobility.    Time 6    Period Weeks    Status On-going      PT LONG TERM GOAL #2   Title Patietn will be able to walk without brace or antalgic gait for at least 5 minutes to demonstrate improved functional knee strength.    Time 6    Period Weeks    Status Achieved      PT LONG TERM GOAL #3   Title Patient will report at least 75% improvement in overall symptoms and/or function to demonstrate improved functional mobility    Time 6    Period Weeks    Status On-going      PT LONG TERM GOAL #4   Title Patient will be able to go up and down the stairs with use of one railing as needed and reciprocal gait pattern to improve overall stair mobility    Time 6    Period Weeks    Status Achieved                   Plan - 08/08/21 1036     Clinical Impression Statement Able to progress weight bearing exercises with good tolerance on this date. Fatigue in legs noted but no increase in pain. Able to tolerate elliptical without pain on this date. Added squats with pauses at end range to HEP. Will continue to progress weighted exercises and transition to single leg activities if he continues to tolerate current POC. Discussed weaning from brace as appropriate with current symptoms.    Personal Factors and Comorbidities Comorbidity 1;Comorbidity 2;Fitness;Profession    Comorbidities gout,  overweigh, arthritis    Examination-Activity Limitations Squat;Stairs;Stand;Transfers;Locomotion Level;Lift    Examination-Participation Restrictions Occupation;Meal Prep;Laundry;Yard Work;Community Activity;Cleaning;Shop    Stability/Clinical  Decision Making Stable/Uncomplicated    Rehab Potential Good    PT Frequency 2x / week    PT Duration 4 weeks    PT Treatment/Interventions ADLs/Self Care Home Management;Aquatic Therapy;Cryotherapy;Electrical Stimulation;Moist Heat;Balance training;Therapeutic exercise;Manual techniques;Therapeutic activities;Stair training;Gait training;DME Instruction;Neuromuscular re-education;Patient/family education;Passive range of motion;Dry needling;Joint Manipulations    PT Next Visit Plan traction for pain relief. total gym exercises, edema management, quad/LE strengthening and ROM.  continue X 4 more weeks    PT Home Exercise Plan heel slides, Quad set, SLR; 8/1 ham curls, hip ext, bridge, SAQs, prone quad set; 8/9:  Heel raise, lunge, step up sit to stand; 8/18 self mobilization, scar mobilization, patella mobs; 9/6 self traction; 9/12 squat pauses             Patient will benefit from skilled therapeutic intervention in order to improve the following deficits and impairments:  Pain, Decreased strength, Decreased activity tolerance, Difficulty walking, Decreased range of motion, Decreased balance, Abnormal gait, Decreased endurance, Decreased skin integrity, Decreased scar mobility  Visit Diagnosis: Difficulty in walking, not elsewhere classified  Chronic pain of left knee  Decreased range of motion (ROM) of left knee  Muscle weakness (generalized)     Problem List Patient Active Problem List   Diagnosis Date Noted   Mixed hyperlipidemia 07/05/2021   Lymphedema 07/05/2021   Onychomycosis 07/05/2021   Chronic pain of left knee 07/05/2021   Type 2 diabetes mellitus with morbid obesity ([place]) 06/23/2021   Gout 06/20/2021   S/P arthroscopy of left knee 05/17/21 05/25/2021   Chondral defect of condyle of left femur    Acute medial meniscus tear of left knee    Cough in adult patient 11/18/2020   Colon cancer screening 03/22/2020   Obstructive sleep apnea 01/05/2020   Morbid obesity  ([place]) 10/15/2016   Allergic rhinitis 01/06/2015   Prostate hypertrophy 01/06/2015   Venous stasis dermatitis 03/23/2014   Dyspnea 05/18/2013   HTN (hypertension)    10:43 AM, 08/08/21 Jerene Pitch, DPT Physical Therapy with [facility]  [phone] office   [place] [address] [place], [place], [address] Phone: [phone]   Fax:  [phone]  Name: ZAKHAI MEISINGER MRN: [id number] Date of Birth: 1971/01/22

## 2021-08-09 ENCOUNTER — Telehealth: Payer: Self-pay | Admitting: Orthopedic Surgery

## 2021-08-09 NOTE — Telephone Encounter (Signed)
Fax re-sent to [employer], patient's short-term disability payer, previously faxed 08/05/21, via Release of information, reference request [id number], to fax [phone]

## 2021-08-11 ENCOUNTER — Ambulatory Visit (HOSPITAL_COMMUNITY): Payer: BC Managed Care – PPO | Admitting: Physical Therapy

## 2021-08-11 ENCOUNTER — Other Ambulatory Visit: Payer: Self-pay

## 2021-08-11 DIAGNOSIS — R262 Difficulty in walking, not elsewhere classified: Secondary | ICD-10-CM | POA: Diagnosis not present

## 2021-08-11 DIAGNOSIS — M6281 Muscle weakness (generalized): Secondary | ICD-10-CM | POA: Diagnosis not present

## 2021-08-11 DIAGNOSIS — M25562 Pain in left knee: Secondary | ICD-10-CM | POA: Diagnosis not present

## 2021-08-11 DIAGNOSIS — M25662 Stiffness of left knee, not elsewhere classified: Secondary | ICD-10-CM

## 2021-08-11 DIAGNOSIS — G8929 Other chronic pain: Secondary | ICD-10-CM

## 2021-08-11 NOTE — Therapy (Signed)
[place], [place], [address] Phone: [phone]   Fax:  [phone]  Physical Therapy Treatment  Patient Details  Name: James Rivas MRN: [id number] Date of Birth: [birth year] Referring Provider (PT): Arther Abbott   Encounter Date: 08/11/2021   PT End of Session - 08/11/21 1039     Visit Number 15    Number of Visits 20    Date for PT Re-Evaluation 09/01/21    Authorization Type BCBS COMM PPO no auth or VL    Progress Note Due on Visit 20    PT Start Time 1000    PT Stop Time 1039    PT Time Calculation (min) 39 min    Activity Tolerance Patient tolerated treatment well    Behavior During Therapy [facility] for tasks assessed/performed             Past Medical History:  Diagnosis Date   Allergy    Arthritis    left knee   Chest pain    Chronic chest pain 06/22/2013   Gout    Gout    History of COVID-19 12/20/2020   HTN (hypertension)    Impaired fasting glucose    Morbid obesity ([place])    Sleep apnea    uses a CPAP   Venous stasis     Past Surgical History:  Procedure Laterality Date   COLONOSCOPY WITH PROPOFOL N/A 05/11/2020   Procedure: COLONOSCOPY WITH PROPOFOL;  Surgeon: Daneil Dolin, MD;  [facility];  Service: Endoscopy;  Laterality: N/A;  2:15pm   FINGER SURGERY Left    left ring finger 2 surgeries   I & D EXTREMITY  11/29/2011   Procedure: IRRIGATION AND DEBRIDEMENT EXTREMITY;  Surgeon: Linna Hoff;  Location: [place];  Service: Orthopedics;  Laterality: Left;  I&D Right Long and Index Fingers with Tendon Repair as Needed   KNEE ARTHROSCOPY WITH MEDIAL MENISECTOMY Left 05/17/2021   Procedure: KNEE ARTHROSCOPY WITH MICROFRACTURE;  Surgeon: Carole Civil, MD;  [facility];  Service: Orthopedics;  Laterality: Left;   MENISECTOMY Left 05/17/2021   Procedure: PARTIAL MEDIAL MENISECTOMY;  Surgeon: Carole Civil, MD;  [facility];  Service: Orthopedics;   Laterality: Left;    There were no vitals filed for this visit.       [facility] PT Assessment - 08/11/21 0001       Assessment   Medical Diagnosis left knee scope with medial menisectomy and medial microfracture    Referring Provider (PT) Arther Abbott    Onset Date/Surgical Date 05/17/21                           [facility] Adult PT Treatment/Exercise - 08/11/21 0001       Knee/Hip Exercises: Machines for Strengthening   Total Gym Leg Press x20 with 75% weight on left at level 32 3x20 - slow lower; SL left leg press 4x5 level 20      Knee/Hip Exercises: Standing   Other Standing Knee Exercises farmers carries 35# KB B cues to not sway laterally - 3x3 laps of 50 feet                       PT Short Term Goals - 08/02/21 1123       PT SHORT TERM GOAL #1   Title Patient will demonstrate at least 2-120 degrees of left knee ROM  Baseline 8/29: Lt knee 0-116    Time 3    Period Weeks    Status Partially Met    Target Date 07/14/21      PT SHORT TERM GOAL #2   Title Patient will report at least 50% improvement in overall symptoms and/or function to demonstrate improved functional mobility    Baseline reports he feels between 25-40% better depends on the  day    Time 3    Period Weeks    Status On-going    Target Date 07/14/21      PT SHORT TERM GOAL #3   Title Patient will be independent in self management strategies to improve quality of life and functional outcomes.    Time 3    Period Weeks    Status Achieved    Target Date 07/14/21               PT Long Term Goals - 07/25/21 1020       PT LONG TERM GOAL #1   Title Patient will improve on FOTO score to meet predicted outcomes to demonstrate improved functional mobility.    Time 6    Period Weeks    Status On-going      PT LONG TERM GOAL #2   Title Patietn will be able to walk without brace or antalgic gait for at least 5 minutes to demonstrate improved functional knee  strength.    Time 6    Period Weeks    Status Achieved      PT LONG TERM GOAL #3   Title Patient will report at least 75% improvement in overall symptoms and/or function to demonstrate improved functional mobility    Time 6    Period Weeks    Status On-going      PT LONG TERM GOAL #4   Title Patient will be able to go up and down the stairs with use of one railing as needed and reciprocal gait pattern to improve overall stair mobility    Time 6    Period Weeks    Status Achieved                   Plan - 08/11/21 1029     Clinical Impression Statement Able to progress intensity of exercises on total gym and with farmer's carries. Burning in left quad and visible weakness with single leg leg press on total gym at level 20, will continue with current plan as patient is doing well with progressive loading. Overall improvement noted with less pain, once patient can single leg leg press his body weight at max height on total gym will progress to single leg exercises standing.    Personal Factors and Comorbidities Comorbidity 1;Comorbidity 2;Fitness;Profession    Comorbidities gout, overweigh, arthritis    Examination-Activity Limitations Squat;Stairs;Stand;Transfers;Locomotion Level;Lift    Examination-Participation Restrictions Occupation;Meal Prep;Laundry;Yard Work;Community Activity;Cleaning;Shop    Stability/Clinical Decision Making Stable/Uncomplicated    Rehab Potential Good    PT Frequency 2x / week    PT Duration 4 weeks    PT Treatment/Interventions ADLs/Self Care Home Management;Aquatic Therapy;Cryotherapy;Electrical Stimulation;Moist Heat;Balance training;Therapeutic exercise;Manual techniques;Therapeutic activities;Stair training;Gait training;DME Instruction;Neuromuscular re-education;Patient/family education;Passive range of motion;Dry needling;Joint Manipulations    PT Next Visit Plan traction for pain relief. total gym exercises, edema management, quad/LE  strengthening and ROM.  continue X 4 more weeks    PT Home Exercise Plan heel slides, Quad set, SLR; 8/1 ham curls, hip ext, bridge, SAQs, prone quad set; 8/9:  Heel  raise, lunge, step up sit to stand; 8/18 self mobilization, scar mobilization, patella mobs; 9/6 self traction; 9/12 squat pauses             Patient will benefit from skilled therapeutic intervention in order to improve the following deficits and impairments:  Pain, Decreased strength, Decreased activity tolerance, Difficulty walking, Decreased range of motion, Decreased balance, Abnormal gait, Decreased endurance, Decreased skin integrity, Decreased scar mobility  Visit Diagnosis: Difficulty in walking, not elsewhere classified  Chronic pain of left knee  Decreased range of motion (ROM) of left knee     Problem List Patient Active Problem List   Diagnosis Date Noted   Mixed hyperlipidemia 07/05/2021   Lymphedema 07/05/2021   Onychomycosis 07/05/2021   Chronic pain of left knee 07/05/2021   Type 2 diabetes mellitus with morbid obesity ([place]) 06/23/2021   Gout 06/20/2021   S/P arthroscopy of left knee 05/17/21 05/25/2021   Chondral defect of condyle of left femur    Acute medial meniscus tear of left knee    Cough in adult patient 11/18/2020   Colon cancer screening 03/22/2020   Obstructive sleep apnea 01/05/2020   Morbid obesity ([place]) 10/15/2016   Allergic rhinitis 01/06/2015   Prostate hypertrophy 01/06/2015   Venous stasis dermatitis 03/23/2014   Dyspnea 05/18/2013   HTN (hypertension)    10:42 AM, 08/11/21 Jerene Pitch, DPT Physical Therapy with  [facility]  [phone] office   [place] [address] [place], [place], [address] Phone: [phone]   Fax:  [phone]  Name: MASAYOSHI COUZENS MRN: [id number] Date of Birth: 02/26/1971

## 2021-08-15 ENCOUNTER — Encounter (HOSPITAL_COMMUNITY): Payer: BC Managed Care – PPO

## 2021-08-15 ENCOUNTER — Telehealth: Payer: Self-pay | Admitting: Internal Medicine

## 2021-08-15 NOTE — Telephone Encounter (Signed)
[employer] form  Copied Noted sleeved

## 2021-08-16 ENCOUNTER — Other Ambulatory Visit: Payer: Self-pay

## 2021-08-16 ENCOUNTER — Ambulatory Visit (HOSPITAL_COMMUNITY): Payer: BC Managed Care – PPO | Admitting: Physical Therapy

## 2021-08-16 DIAGNOSIS — M25562 Pain in left knee: Secondary | ICD-10-CM | POA: Diagnosis not present

## 2021-08-16 DIAGNOSIS — M6281 Muscle weakness (generalized): Secondary | ICD-10-CM

## 2021-08-16 DIAGNOSIS — M25662 Stiffness of left knee, not elsewhere classified: Secondary | ICD-10-CM | POA: Diagnosis not present

## 2021-08-16 DIAGNOSIS — R262 Difficulty in walking, not elsewhere classified: Secondary | ICD-10-CM | POA: Diagnosis not present

## 2021-08-16 DIAGNOSIS — G8929 Other chronic pain: Secondary | ICD-10-CM | POA: Diagnosis not present

## 2021-08-16 NOTE — Therapy (Signed)
[place], [place], [address] Phone: [phone]   Fax:  [phone]  Physical Therapy Treatment  Patient Details  Name: James Rivas MRN: [id number] Date of Birth: January 05, 1971 Referring Provider (PT): Arther Abbott   Encounter Date: 08/16/2021   PT End of Session - 08/16/21 1045     Visit Number 16    Number of Visits 20    Date for PT Re-Evaluation 09/01/21    Authorization Type BCBS COMM PPO no auth or VL    Progress Note Due on Visit 20    PT Start Time 1000    PT Stop Time 1040    PT Time Calculation (min) 40 min    Activity Tolerance Patient tolerated treatment well    Behavior During Therapy [facility] for tasks assessed/performed             Past Medical History:  Diagnosis Date   Allergy    Arthritis    left knee   Chest pain    Chronic chest pain 06/22/2013   Gout    Gout    History of COVID-19 12/20/2020   HTN (hypertension)    Impaired fasting glucose    Morbid obesity ([place])    Sleep apnea    uses a CPAP   Venous stasis     Past Surgical History:  Procedure Laterality Date   COLONOSCOPY WITH PROPOFOL N/A 05/11/2020   Procedure: COLONOSCOPY WITH PROPOFOL;  Surgeon: Daneil Dolin, MD;  [facility];  Service: Endoscopy;  Laterality: N/A;  2:15pm   FINGER SURGERY Left    left ring finger 2 surgeries   I & D EXTREMITY  11/29/2011   Procedure: IRRIGATION AND DEBRIDEMENT EXTREMITY;  Surgeon: Linna Hoff;  Location: [place];  Service: Orthopedics;  Laterality: Left;  I&D Right Long and Index Fingers with Tendon Repair as Needed   KNEE ARTHROSCOPY WITH MEDIAL MENISECTOMY Left 05/17/2021   Procedure: KNEE ARTHROSCOPY WITH MICROFRACTURE;  Surgeon: Carole Civil, MD;  [facility];  Service: Orthopedics;  Laterality: Left;   MENISECTOMY Left 05/17/2021   Procedure: PARTIAL MEDIAL MENISECTOMY;  Surgeon: Carole Civil, MD;  [facility];  Service: Orthopedics;   Laterality: Left;    There were no vitals filed for this visit.   Subjective Assessment - 08/16/21 1032     Subjective Reports no pain, states he was sore after last session so he iced it.                [facility] PT Assessment - 08/16/21 0001       Assessment   Medical Diagnosis left knee scope with medial menisectomy and medial microfracture    Referring Provider (PT) Arther Abbott    Onset Date/Surgical Date 05/17/21    Next MD Visit 08/25/21                           [place] Adult PT Treatment/Exercise - 08/16/21 0001       Knee/Hip Exercises: Aerobic   Elliptical 3 minutes as warmup level 3      Knee/Hip Exercises: Machines for Strengthening   Total Gym Leg Press SL left leg press 5x5 level 25      Knee/Hip Exercises: Standing   Forward Step Up Step Height: 6";Hand Hold: 2;10 reps;Both   with contral lateral knee drive - alternating   Rocker Board 1 minute   lateral x2  Other Standing Knee Exercises goblet squats x12 5" holds    Other Standing Knee Exercises fwd slides - squat first then slide right foot forward then hold 2" holds then slide back and stand up - 4x6 L                       PT Short Term Goals - 08/02/21 1123       PT SHORT TERM GOAL #1   Title Patient will demonstrate at least 2-120 degrees of left knee ROM    Baseline 8/29: Lt knee 0-116    Time 3    Period Weeks    Status Partially Met    Target Date 07/14/21      PT SHORT TERM GOAL #2   Title Patient will report at least 50% improvement in overall symptoms and/or function to demonstrate improved functional mobility    Baseline reports he feels between 25-40% better depends on the  day    Time 3    Period Weeks    Status On-going    Target Date 07/14/21      PT SHORT TERM GOAL #3   Title Patient will be independent in self management strategies to improve quality of life and functional outcomes.    Time 3    Period Weeks    Status Achieved    Target  Date 07/14/21               PT Long Term Goals - 07/25/21 1020       PT LONG TERM GOAL #1   Title Patient will improve on FOTO score to meet predicted outcomes to demonstrate improved functional mobility.    Time 6    Period Weeks    Status On-going      PT LONG TERM GOAL #2   Title Patietn will be able to walk without brace or antalgic gait for at least 5 minutes to demonstrate improved functional knee strength.    Time 6    Period Weeks    Status Achieved      PT LONG TERM GOAL #3   Title Patient will report at least 75% improvement in overall symptoms and/or function to demonstrate improved functional mobility    Time 6    Period Weeks    Status On-going      PT LONG TERM GOAL #4   Title Patient will be able to go up and down the stairs with use of one railing as needed and reciprocal gait pattern to improve overall stair mobility    Time 6    Period Weeks    Status Achieved                   Plan - 08/16/21 1040     Clinical Impression Statement Able to progress patient to single leg step up with contralateral knee drive today. Pain felt during this exercise but it did not compound and resolved with rest breaks in between reps. Fatigue in legs noted but no knee pain. Overall patient is doing well and would continued to benefit from progressive loading working towards full body weight on left LE with strengthening and functional activity.    Personal Factors and Comorbidities Comorbidity 1;Comorbidity 2;Fitness;Profession    Comorbidities gout, overweigh, arthritis    Examination-Activity Limitations Squat;Stairs;Stand;Transfers;Locomotion Level;Lift    Examination-Participation Restrictions Occupation;Meal Prep;Laundry;Yard Work;Community Activity;Cleaning;Shop    Stability/Clinical Decision Making Stable/Uncomplicated    Rehab Potential Good    PT  Frequency 2x / week    PT Duration 4 weeks    PT Treatment/Interventions ADLs/Self Care Home  Management;Aquatic Therapy;Cryotherapy;Electrical Stimulation;Moist Heat;Balance training;Therapeutic exercise;Manual techniques;Therapeutic activities;Stair training;Gait training;DME Instruction;Neuromuscular re-education;Patient/family education;Passive range of motion;Dry needling;Joint Manipulations    PT Next Visit Plan progress loading of left leg working towards full weight bearing on left with strength training and functional activities; traction for pain relief. total gym exercises    PT Home Exercise Plan heel slides, Quad set, SLR; 8/1 ham curls, hip ext, bridge, SAQs, prone quad set; 8/9:  Heel raise, lunge, step up sit to stand; 8/18 self mobilization, scar mobilization, patella mobs; 9/6 self traction; 9/12 squat pauses             Patient will benefit from skilled therapeutic intervention in order to improve the following deficits and impairments:  Pain, Decreased strength, Decreased activity tolerance, Difficulty walking, Decreased range of motion, Decreased balance, Abnormal gait, Decreased endurance, Decreased skin integrity, Decreased scar mobility  Visit Diagnosis: Difficulty in walking, not elsewhere classified  Chronic pain of left knee  Decreased range of motion (ROM) of left knee  Muscle weakness (generalized)     Problem List Patient Active Problem List   Diagnosis Date Noted   Mixed hyperlipidemia 07/05/2021   Lymphedema 07/05/2021   Onychomycosis 07/05/2021   Chronic pain of left knee 07/05/2021   Type 2 diabetes mellitus with morbid obesity ([place]) 06/23/2021   Gout 06/20/2021   S/P arthroscopy of left knee 05/17/21 05/25/2021   Chondral defect of condyle of left femur    Acute medial meniscus tear of left knee    Cough in adult patient 11/18/2020   Colon cancer screening 03/22/2020   Obstructive sleep apnea 01/05/2020   Morbid obesity ([place]) 10/15/2016   Allergic rhinitis 01/06/2015   Prostate hypertrophy 01/06/2015   Venous stasis dermatitis  03/23/2014   Dyspnea 05/18/2013   HTN (hypertension)     10:46 AM, 08/16/21 Jerene Pitch, DPT Physical Therapy with [facility]  [phone] office   [place] [address] [place], [place], [address] Phone: [phone]   Fax:  [phone]  Name: James Rivas MRN: [id number] Date of Birth: Oct 02, 1971

## 2021-08-17 ENCOUNTER — Ambulatory Visit (HOSPITAL_COMMUNITY): Payer: BC Managed Care – PPO | Admitting: Physical Therapy

## 2021-08-17 DIAGNOSIS — M25662 Stiffness of left knee, not elsewhere classified: Secondary | ICD-10-CM | POA: Diagnosis not present

## 2021-08-17 DIAGNOSIS — R262 Difficulty in walking, not elsewhere classified: Secondary | ICD-10-CM

## 2021-08-17 DIAGNOSIS — M25562 Pain in left knee: Secondary | ICD-10-CM

## 2021-08-17 DIAGNOSIS — M6281 Muscle weakness (generalized): Secondary | ICD-10-CM | POA: Diagnosis not present

## 2021-08-17 DIAGNOSIS — G8929 Other chronic pain: Secondary | ICD-10-CM

## 2021-08-17 NOTE — Therapy (Signed)
[place], [place], [address] Phone: [phone]   Fax:  [phone]  Physical Therapy Treatment  Patient Details  Name: James Rivas MRN: [id number] Date of Birth: [birth year] Referring Provider (PT): Arther Abbott   Encounter Date: 08/17/2021   PT End of Session - 08/17/21 1049     Visit Number 17    Number of Visits 20    Date for PT Re-Evaluation 09/01/21    Authorization Type BCBS COMM PPO no auth or VL    Progress Note Due on Visit 20    PT Start Time 1048    PT Stop Time 1128    PT Time Calculation (min) 40 min    Activity Tolerance Patient tolerated treatment well    Behavior During Therapy [facility] for tasks assessed/performed             Past Medical History:  Diagnosis Date   Allergy    Arthritis    left knee   Chest pain    Chronic chest pain 06/22/2013   Gout    Gout    History of COVID-19 12/20/2020   HTN (hypertension)    Impaired fasting glucose    Morbid obesity ([place])    Sleep apnea    uses a CPAP   Venous stasis     Past Surgical History:  Procedure Laterality Date   COLONOSCOPY WITH PROPOFOL N/A 05/11/2020   Procedure: COLONOSCOPY WITH PROPOFOL;  Surgeon: Daneil Dolin, MD;  [facility];  Service: Endoscopy;  Laterality: N/A;  2:15pm   FINGER SURGERY Left    left ring finger 2 surgeries   I & D EXTREMITY  11/29/2011   Procedure: IRRIGATION AND DEBRIDEMENT EXTREMITY;  Surgeon: Linna Hoff;  Location: [place];  Service: Orthopedics;  Laterality: Left;  I&D Right Long and Index Fingers with Tendon Repair as Needed   KNEE ARTHROSCOPY WITH MEDIAL MENISECTOMY Left 05/17/2021   Procedure: KNEE ARTHROSCOPY WITH MICROFRACTURE;  Surgeon: Carole Civil, MD;  [facility];  Service: Orthopedics;  Laterality: Left;   MENISECTOMY Left 05/17/2021   Procedure: PARTIAL MEDIAL MENISECTOMY;  Surgeon: Carole Civil, MD;  [facility];  Service: Orthopedics;   Laterality: Left;    There were no vitals filed for this visit.   Subjective Assessment - 08/17/21 1048     Subjective Reports no pain, states he was a little achy after last session so he iced it.                [facility] PT Assessment - 08/17/21 0001       Assessment   Medical Diagnosis left knee scope with medial menisectomy and medial microfracture    Referring Provider (PT) Arther Abbott    Next MD Visit 08/25/21                           [place] Adult PT Treatment/Exercise - 08/17/21 0001       Knee/Hip Exercises: Aerobic   Elliptical 3 minutes as warmup level 3      Knee/Hip Exercises: Machines for Strengthening   Total Gym Leg Press SL left leg press 5x5 level 26; DL on level 25 x20      Knee/Hip Exercises: Standing   Lateral Step Up Step Height: 6";Left;4 sets;5 reps;Hand Hold: 0    Rocker Board 2 minutes   lateral   Other Standing Knee Exercises farmers carries 3x3  2-35# KB - 50 feet                       PT Short Term Goals - 08/02/21 1123       PT SHORT TERM GOAL #1   Title Patient will demonstrate at least 2-120 degrees of left knee ROM    Baseline 8/29: Lt knee 0-116    Time 3    Period Weeks    Status Partially Met    Target Date 07/14/21      PT SHORT TERM GOAL #2   Title Patient will report at least 50% improvement in overall symptoms and/or function to demonstrate improved functional mobility    Baseline reports he feels between 25-40% better depends on the  day    Time 3    Period Weeks    Status On-going    Target Date 07/14/21      PT SHORT TERM GOAL #3   Title Patient will be independent in self management strategies to improve quality of life and functional outcomes.    Time 3    Period Weeks    Status Achieved    Target Date 07/14/21               PT Long Term Goals - 07/25/21 1020       PT LONG TERM GOAL #1   Title Patient will improve on FOTO score to meet predicted outcomes to  demonstrate improved functional mobility.    Time 6    Period Weeks    Status On-going      PT LONG TERM GOAL #2   Title Patietn will be able to walk without brace or antalgic gait for at least 5 minutes to demonstrate improved functional knee strength.    Time 6    Period Weeks    Status Achieved      PT LONG TERM GOAL #3   Title Patient will report at least 75% improvement in overall symptoms and/or function to demonstrate improved functional mobility    Time 6    Period Weeks    Status On-going      PT LONG TERM GOAL #4   Title Patient will be able to go up and down the stairs with use of one railing as needed and reciprocal gait pattern to improve overall stair mobility    Time 6    Period Weeks    Status Achieved                   Plan - 08/17/21 1049     Clinical Impression Statement Overall patient performing well. Able to add lateral step ups with powering through left leg today with minor pain noted during but no lasting pain. Encouraged patient to rest so pain completely resolved after each set. Not pain noted end of session. Will continue with current POC as tolerated.    Personal Factors and Comorbidities Comorbidity 1;Comorbidity 2;Fitness;Profession    Comorbidities gout, overweigh, arthritis    Examination-Activity Limitations Squat;Stairs;Stand;Transfers;Locomotion Level;Lift    Examination-Participation Restrictions Occupation;Meal Prep;Laundry;Yard Work;Community Activity;Cleaning;Shop    Stability/Clinical Decision Making Stable/Uncomplicated    Rehab Potential Good    PT Frequency 2x / week    PT Duration 4 weeks    PT Treatment/Interventions ADLs/Self Care Home Management;Aquatic Therapy;Cryotherapy;Electrical Stimulation;Moist Heat;Balance training;Therapeutic exercise;Manual techniques;Therapeutic activities;Stair training;Gait training;DME Instruction;Neuromuscular re-education;Patient/family education;Passive range of motion;Dry needling;Joint  Manipulations    PT Next Visit Plan progress loading of left leg working  towards full weight bearing on left with strength training and functional activities; traction for pain relief. total gym exercises    PT Home Exercise Plan heel slides, Quad set, SLR; 8/1 ham curls, hip ext, bridge, SAQs, prone quad set; 8/9:  Heel raise, lunge, step up sit to stand; 8/18 self mobilization, scar mobilization, patella mobs; 9/6 self traction; 9/12 squat pauses             Patient will benefit from skilled therapeutic intervention in order to improve the following deficits and impairments:  Pain, Decreased strength, Decreased activity tolerance, Difficulty walking, Decreased range of motion, Decreased balance, Abnormal gait, Decreased endurance, Decreased skin integrity, Decreased scar mobility  Visit Diagnosis: Difficulty in walking, not elsewhere classified  Chronic pain of left knee  Muscle weakness (generalized)     Problem List Patient Active Problem List   Diagnosis Date Noted   Mixed hyperlipidemia 07/05/2021   Lymphedema 07/05/2021   Onychomycosis 07/05/2021   Chronic pain of left knee 07/05/2021   Type 2 diabetes mellitus with morbid obesity ([place]) 06/23/2021   Gout 06/20/2021   S/P arthroscopy of left knee 05/17/21 05/25/2021   Chondral defect of condyle of left femur    Acute medial meniscus tear of left knee    Cough in adult patient 11/18/2020   Colon cancer screening 03/22/2020   Obstructive sleep apnea 01/05/2020   Morbid obesity ([place]) 10/15/2016   Allergic rhinitis 01/06/2015   Prostate hypertrophy 01/06/2015   Venous stasis dermatitis 03/23/2014   Dyspnea 05/18/2013   HTN (hypertension)   11:31 AM, 08/17/21 Jerene Pitch, DPT Physical Therapy with [facility]  [phone] office   [place] [address] [place], [place], [address] Phone: [phone]   Fax:  [phone]  Name: RUI WORDELL MRN:  [id number] Date of Birth: 1971/08/12

## 2021-08-19 ENCOUNTER — Encounter (HOSPITAL_COMMUNITY): Payer: Self-pay

## 2021-08-22 ENCOUNTER — Ambulatory Visit (HOSPITAL_COMMUNITY): Payer: BC Managed Care – PPO | Admitting: Physical Therapy

## 2021-08-22 ENCOUNTER — Other Ambulatory Visit: Payer: Self-pay

## 2021-08-22 ENCOUNTER — Encounter (HOSPITAL_COMMUNITY): Payer: Self-pay | Admitting: Physical Therapy

## 2021-08-22 DIAGNOSIS — M25562 Pain in left knee: Secondary | ICD-10-CM

## 2021-08-22 DIAGNOSIS — G8929 Other chronic pain: Secondary | ICD-10-CM

## 2021-08-22 DIAGNOSIS — R262 Difficulty in walking, not elsewhere classified: Secondary | ICD-10-CM

## 2021-08-22 DIAGNOSIS — M25662 Stiffness of left knee, not elsewhere classified: Secondary | ICD-10-CM | POA: Diagnosis not present

## 2021-08-22 DIAGNOSIS — M6281 Muscle weakness (generalized): Secondary | ICD-10-CM

## 2021-08-22 NOTE — Patient Instructions (Signed)
Access Code: [email or web address] URL: [email or web address] Date: 08/22/2021 Prepared by: Greig Castilla Mikkel Charrette  Exercises Single Leg Balance with Clock Reach (Mirrored) - 1 x daily - 7 x weekly - 3 sets - 3 reps

## 2021-08-22 NOTE — Therapy (Signed)
[place], [place], [address] Phone: [phone]   Fax:  [phone]  Physical Therapy Treatment  Patient Details  Name: James Rivas MRN: [id number] Date of Birth: [birth year] Referring Provider (PT): Arther Abbott   Encounter Date: 08/22/2021   PT End of Session - 08/22/21 0956     Visit Number 18    Number of Visits 20    Date for PT Re-Evaluation 09/01/21    Authorization Type BCBS COMM PPO no auth or VL    Progress Note Due on Visit 20    PT Start Time 0958    PT Stop Time 1043    PT Time Calculation (min) 45 min    Activity Tolerance Patient tolerated treatment well    Behavior During Therapy [facility] for tasks assessed/performed             Past Medical History:  Diagnosis Date   Allergy    Arthritis    left knee   Chest pain    Chronic chest pain 06/22/2013   Gout    Gout    History of COVID-19 12/20/2020   HTN (hypertension)    Impaired fasting glucose    Morbid obesity ([place])    Sleep apnea    uses a CPAP   Venous stasis     Past Surgical History:  Procedure Laterality Date   COLONOSCOPY WITH PROPOFOL N/A 05/11/2020   Procedure: COLONOSCOPY WITH PROPOFOL;  Surgeon: Daneil Dolin, MD;  [facility];  Service: Endoscopy;  Laterality: N/A;  2:15pm   FINGER SURGERY Left    left ring finger 2 surgeries   I & D EXTREMITY  11/29/2011   Procedure: IRRIGATION AND DEBRIDEMENT EXTREMITY;  Surgeon: Linna Hoff;  Location: [place];  Service: Orthopedics;  Laterality: Left;  I&D Right Long and Index Fingers with Tendon Repair as Needed   KNEE ARTHROSCOPY WITH MEDIAL MENISECTOMY Left 05/17/2021   Procedure: KNEE ARTHROSCOPY WITH MICROFRACTURE;  Surgeon: Carole Civil, MD;  [facility];  Service: Orthopedics;  Laterality: Left;   MENISECTOMY Left 05/17/2021   Procedure: PARTIAL MEDIAL MENISECTOMY;  Surgeon: Carole Civil, MD;  [facility];  Service: Orthopedics;   Laterality: Left;    There were no vitals filed for this visit.   Subjective Assessment - 08/22/21 0957     Subjective Patient states knee was a little sore after last time and he iced it. Things have been getting better.    Currently in Pain? No/denies                               [facility] Adult PT Treatment/Exercise - 08/22/21 0001       Knee/Hip Exercises: Aerobic   Elliptical 3 minutes as warmup level 3      Knee/Hip Exercises: Machines for Strengthening   Total Gym Leg Press SL left leg press 5x5 level 25; DL on level 25 x20      Knee/Hip Exercises: Standing   Forward Lunges 5 reps    Forward Lunges Limitations 3 sets LLE    Lateral Step Up Left;3 sets;5 reps;Hand Hold: 0;Step Height: 6"    Rocker Board 2 minutes    Rocker Board Limitations lateral    SLS with Vectors with mini squat 2 sets of 3      Manual Therapy   Manual Therapy Joint mobilization    Manual therapy  comments all manual interventions performed independently of other interventions    Joint Mobilization grade III CPA L1-L5                     PT Education - 08/22/21 0957     Education Details HEP    Person(s) Educated Patient    Methods Explanation    Comprehension Verbalized understanding              PT Short Term Goals - 08/02/21 1123       PT SHORT TERM GOAL #1   Title Patient will demonstrate at least 2-120 degrees of left knee ROM    Baseline 8/29: Lt knee 0-116    Time 3    Period Weeks    Status Partially Met    Target Date 07/14/21      PT SHORT TERM GOAL #2   Title Patient will report at least 50% improvement in overall symptoms and/or function to demonstrate improved functional mobility    Baseline reports he feels between 25-40% better depends on the  day    Time 3    Period Weeks    Status On-going    Target Date 07/14/21      PT SHORT TERM GOAL #3   Title Patient will be independent in self management strategies to improve quality of  life and functional outcomes.    Time 3    Period Weeks    Status Achieved    Target Date 07/14/21               PT Long Term Goals - 07/25/21 1020       PT LONG TERM GOAL #1   Title Patient will improve on FOTO score to meet predicted outcomes to demonstrate improved functional mobility.    Time 6    Period Weeks    Status On-going      PT LONG TERM GOAL #2   Title Patietn will be able to walk without brace or antalgic gait for at least 5 minutes to demonstrate improved functional knee strength.    Time 6    Period Weeks    Status Achieved      PT LONG TERM GOAL #3   Title Patient will report at least 75% improvement in overall symptoms and/or function to demonstrate improved functional mobility    Time 6    Period Weeks    Status On-going      PT LONG TERM GOAL #4   Title Patient will be able to go up and down the stairs with use of one railing as needed and reciprocal gait pattern to improve overall stair mobility    Time 6    Period Weeks    Status Achieved                   Plan - 08/22/21 0956     Clinical Impression Statement Patient demonstrates quick quad fatigue with SL total gym exercise and he lacks TKE strength with fatigue. Patient with difficulty with SLS vectors with mini squat with LLE trembling noted secondary to quad weakness. Patient with c/o pain during but none following. Patient educated on weakness likely contributing to stiffness. Requires unilateral UE support for balance/weakness. Completed lumbar mobilizations to assess for AMI affecting L quad strength. No change with steps following mobilizations. Patient will continue to benefit from skilled physical therapy in order to reduce impairment and improve function.    Personal Factors and Comorbidities  Comorbidity 1;Comorbidity 2;Fitness;Profession    Comorbidities gout, overweigh, arthritis    Examination-Activity Limitations Squat;Stairs;Stand;Transfers;Locomotion Level;Lift     Examination-Participation Restrictions Occupation;Meal Prep;Laundry;Yard Work;Community Activity;Cleaning;Shop    Stability/Clinical Decision Making Stable/Uncomplicated    Rehab Potential Good    PT Frequency 2x / week    PT Duration 4 weeks    PT Treatment/Interventions ADLs/Self Care Home Management;Aquatic Therapy;Cryotherapy;Electrical Stimulation;Moist Heat;Balance training;Therapeutic exercise;Manual techniques;Therapeutic activities;Stair training;Gait training;DME Instruction;Neuromuscular re-education;Patient/family education;Passive range of motion;Dry needling;Joint Manipulations    PT Next Visit Plan progress loading of left leg working towards full weight bearing on left with strength training and functional activities; traction for pain relief. total gym exercises    PT Home Exercise Plan heel slides, Quad set, SLR; 8/1 ham curls, hip ext, bridge, SAQs, prone quad set; 8/9:  Heel raise, lunge, step up sit to stand; 8/18 self mobilization, scar mobilization, patella mobs; 9/6 self traction; 9/12 squat pauses 9/26 vectors             Patient will benefit from skilled therapeutic intervention in order to improve the following deficits and impairments:  Pain, Decreased strength, Decreased activity tolerance, Difficulty walking, Decreased range of motion, Decreased balance, Abnormal gait, Decreased endurance, Decreased skin integrity, Decreased scar mobility  Visit Diagnosis: Difficulty in walking, not elsewhere classified  Chronic pain of left knee  Muscle weakness (generalized)  Decreased range of motion (ROM) of left knee     Problem List Patient Active Problem List   Diagnosis Date Noted   Mixed hyperlipidemia 07/05/2021   Lymphedema 07/05/2021   Onychomycosis 07/05/2021   Chronic pain of left knee 07/05/2021   Type 2 diabetes mellitus with morbid obesity ([place]) 06/23/2021   Gout 06/20/2021   S/P arthroscopy of left knee 05/17/21 05/25/2021   Chondral defect of  condyle of left femur    Acute medial meniscus tear of left knee    Cough in adult patient 11/18/2020   Colon cancer screening 03/22/2020   Obstructive sleep apnea 01/05/2020   Morbid obesity ([place]) 10/15/2016   Allergic rhinitis 01/06/2015   Prostate hypertrophy 01/06/2015   Venous stasis dermatitis 03/23/2014   Dyspnea 05/18/2013   HTN (hypertension)    10:50 AM, 08/22/21 Mearl Latin PT, DPT Physical Therapist at [place] [address] [place], [place], [address] Phone: [phone]   Fax:  [phone]  Name: James Rivas MRN: [id number] Date of Birth: 07-14-1971

## 2021-08-23 ENCOUNTER — Encounter: Payer: Self-pay | Admitting: Internal Medicine

## 2021-08-23 ENCOUNTER — Other Ambulatory Visit: Payer: Self-pay | Admitting: Family Medicine

## 2021-08-24 ENCOUNTER — Other Ambulatory Visit: Payer: Self-pay

## 2021-08-24 ENCOUNTER — Ambulatory Visit (HOSPITAL_COMMUNITY): Payer: BC Managed Care – PPO | Admitting: Physical Therapy

## 2021-08-24 ENCOUNTER — Encounter (HOSPITAL_COMMUNITY): Payer: BC Managed Care – PPO

## 2021-08-24 ENCOUNTER — Encounter (HOSPITAL_COMMUNITY): Payer: Self-pay | Admitting: Physical Therapy

## 2021-08-24 ENCOUNTER — Other Ambulatory Visit: Payer: Self-pay | Admitting: *Deleted

## 2021-08-24 ENCOUNTER — Other Ambulatory Visit: Payer: Self-pay | Admitting: Internal Medicine

## 2021-08-24 DIAGNOSIS — M25662 Stiffness of left knee, not elsewhere classified: Secondary | ICD-10-CM

## 2021-08-24 DIAGNOSIS — G8929 Other chronic pain: Secondary | ICD-10-CM

## 2021-08-24 DIAGNOSIS — M25562 Pain in left knee: Secondary | ICD-10-CM | POA: Diagnosis not present

## 2021-08-24 DIAGNOSIS — M6281 Muscle weakness (generalized): Secondary | ICD-10-CM

## 2021-08-24 DIAGNOSIS — R262 Difficulty in walking, not elsewhere classified: Secondary | ICD-10-CM

## 2021-08-24 MED ORDER — ENALAPRIL MALEATE 20 MG PO TABS: ORAL_TABLET | ORAL | 0 refills | Status: DC

## 2021-08-24 NOTE — Patient Instructions (Signed)
Access Code: [email or web address] URL: [email or web address] Date: 08/24/2021 Prepared by: Greig Castilla Janyia Guion  Exercises Lunge with Counter Support - 1 x daily - 7 x weekly - 3 sets - 5 reps Lateral Lunge - 1 x daily - 7 x weekly - 3 sets - 5 reps

## 2021-08-24 NOTE — Therapy (Signed)
[facility] [place], [place], [address] Phone: [phone]   Fax:  [phone]  Physical Therapy Treatment  Patient Details  Name: James Rivas MRN: [id number] Date of Birth: 1971-05-02 Referring Provider (PT): Arther Abbott   Encounter Date: 08/24/2021   PT End of Session - 08/24/21 0825     Visit Number 19    Number of Visits 20    Date for PT Re-Evaluation 09/01/21    Authorization Type BCBS COMM PPO no auth or VL    Progress Note Due on Visit 20    PT Start Time 0827    PT Stop Time 0907    PT Time Calculation (min) 40 min    Activity Tolerance Patient tolerated treatment well    Behavior During Therapy [facility] for tasks assessed/performed             Past Medical History:  Diagnosis Date   Allergy    Arthritis    left knee   Chest pain    Chronic chest pain 06/22/2013   Gout    Gout    History of COVID-19 12/20/2020   HTN (hypertension)    Impaired fasting glucose    Morbid obesity ([place])    Sleep apnea    uses a CPAP   Venous stasis     Past Surgical History:  Procedure Laterality Date   COLONOSCOPY WITH PROPOFOL N/A 05/11/2020   Procedure: COLONOSCOPY WITH PROPOFOL;  Surgeon: Daneil Dolin, MD;  [facility];  Service: Endoscopy;  Laterality: N/A;  2:15pm   FINGER SURGERY Left    left ring finger 2 surgeries   I & D EXTREMITY  11/29/2011   Procedure: IRRIGATION AND DEBRIDEMENT EXTREMITY;  Surgeon: Linna Hoff;  Location: [place];  Service: Orthopedics;  Laterality: Left;  I&D Right Long and Index Fingers with Tendon Repair as Needed   KNEE ARTHROSCOPY WITH MEDIAL MENISECTOMY Left 05/17/2021   Procedure: KNEE ARTHROSCOPY WITH MICROFRACTURE;  Surgeon: Carole Civil, MD;  Location: [facility];  Service: Orthopedics;  Laterality: Left;   MENISECTOMY Left 05/17/2021   Procedure: PARTIAL MEDIAL MENISECTOMY;  Surgeon: Carole Civil, MD;  Location: [facility];  Service: Orthopedics;   Laterality: Left;    There were no vitals filed for this visit.   Subjective Assessment - 08/24/21 0826     Subjective Patient states his knee wasn't sore after last session. HEP going well. No concerns.    Currently in Pain? No/denies                [facility] PT Assessment - 08/24/21 0001       Strength   Left Knee Extension 4+/5      Palpation   Patella mobility hypomobility in all directions on left patella    Palpation comment scar tissue L medial knee                           [facility] Adult PT Treatment/Exercise - 08/24/21 0001       Knee/Hip Exercises: Aerobic   Elliptical 3 minutes as warmup level 3      Knee/Hip Exercises: Machines for Strengthening   Total Gym Leg Press SL left leg press 5x5 level 26; DL on level 26 x20      Knee/Hip Exercises: Standing   Forward Lunges 5 reps    Forward Lunges Limitations 3 sets LLE    Side Lunges 5  reps;3 sets;Left    SLS with Vectors with mini squat 3 sets of 3 LLE                     PT Education - 08/24/21 0826     Education Details HEP, exercise technique, returning to [facility], exercise progression, walking program    Person(s) Educated Patient    Methods Explanation;Handout    Comprehension Verbalized understanding              PT Short Term Goals - 08/02/21 1123       PT SHORT TERM GOAL #1   Title Patient will demonstrate at least 2-120 degrees of left knee ROM    Baseline 8/29: Lt knee 0-116    Time 3    Period Weeks    Status Partially Met    Target Date 07/14/21      PT SHORT TERM GOAL #2   Title Patient will report at least 50% improvement in overall symptoms and/or function to demonstrate improved functional mobility    Baseline reports he feels between 25-40% better depends on the  day    Time 3    Period Weeks    Status On-going    Target Date 07/14/21      PT SHORT TERM GOAL #3   Title Patient will be independent in self management strategies to improve quality  of life and functional outcomes.    Time 3    Period Weeks    Status Achieved    Target Date 07/14/21               PT Long Term Goals - 07/25/21 1020       PT LONG TERM GOAL #1   Title Patient will improve on FOTO score to meet predicted outcomes to demonstrate improved functional mobility.    Time 6    Period Weeks    Status On-going      PT LONG TERM GOAL #2   Title Patietn will be able to walk without brace or antalgic gait for at least 5 minutes to demonstrate improved functional knee strength.    Time 6    Period Weeks    Status Achieved      PT LONG TERM GOAL #3   Title Patient will report at least 75% improvement in overall symptoms and/or function to demonstrate improved functional mobility    Time 6    Period Weeks    Status On-going      PT LONG TERM GOAL #4   Title Patient will be able to go up and down the stairs with use of one railing as needed and reciprocal gait pattern to improve overall stair mobility    Time 6    Period Weeks    Status Achieved                   Plan - 08/24/21 0826     Clinical Impression Statement Began session with elliptical for dynamic warm up. Educated patient on return to [facility] for cardio and strengthening exercise to improve function and endurance. Patient demonstrating improving L quad endurance, motor control, and strength with strengthening exercises today but continues to be limited. Requires unilateral finger support for balance with vector squats. Given intermittent cueing for mechanics with good carry over. Demonstrating good MMT strength but lacking functional strength in L quad. Patient will continue to benefit from skilled physical therapy in order to reduce impairment and improve function.  Personal Factors and Comorbidities Comorbidity 1;Comorbidity 2;Fitness;Profession    Comorbidities gout, overweigh, arthritis    Examination-Activity Limitations Squat;Stairs;Stand;Transfers;Locomotion Level;Lift     Examination-Participation Restrictions Occupation;Meal Prep;Laundry;Yard Work;Community Activity;Cleaning;Shop    Stability/Clinical Decision Making Stable/Uncomplicated    Rehab Potential Good    PT Frequency 2x / week    PT Duration 4 weeks    PT Treatment/Interventions ADLs/Self Care Home Management;Aquatic Therapy;Cryotherapy;Electrical Stimulation;Moist Heat;Balance training;Therapeutic exercise;Manual techniques;Therapeutic activities;Stair training;Gait training;DME Instruction;Neuromuscular re-education;Patient/family education;Passive range of motion;Dry needling;Joint Manipulations    PT Next Visit Plan progress loading of left leg working towards full weight bearing on left with strength training and functional activities; traction for pain relief. total gym exercises    PT Home Exercise Plan heel slides, Quad set, SLR; 8/1 ham curls, hip ext, bridge, SAQs, prone quad set; 8/9:  Heel raise, lunge, step up sit to stand; 8/18 self mobilization, scar mobilization, patella mobs; 9/6 self traction; 9/12 squat pauses 9/26 vectors 9/28 lunge foward and lateral             Patient will benefit from skilled therapeutic intervention in order to improve the following deficits and impairments:  Pain, Decreased strength, Decreased activity tolerance, Difficulty walking, Decreased range of motion, Decreased balance, Abnormal gait, Decreased endurance, Decreased skin integrity, Decreased scar mobility  Visit Diagnosis: Difficulty in walking, not elsewhere classified  Chronic pain of left knee  Muscle weakness (generalized)  Decreased range of motion (ROM) of left knee     Problem List Patient Active Problem List   Diagnosis Date Noted   Mixed hyperlipidemia 07/05/2021   Lymphedema 07/05/2021   Onychomycosis 07/05/2021   Chronic pain of left knee 07/05/2021   Type 2 diabetes mellitus with morbid obesity ([place]) 06/23/2021   Gout 06/20/2021   S/P arthroscopy of left knee 05/17/21  05/25/2021   Chondral defect of condyle of left femur    Acute medial meniscus tear of left knee    Cough in adult patient 11/18/2020   Colon cancer screening 03/22/2020   Obstructive sleep apnea 01/05/2020   Morbid obesity ([place]) 10/15/2016   Allergic rhinitis 01/06/2015   Prostate hypertrophy 01/06/2015   Venous stasis dermatitis 03/23/2014   Dyspnea 05/18/2013   HTN (hypertension)     9:12 AM, 08/24/21 Mearl Latin PT, DPT Physical Therapist at [facility] [place], [place], [address] Phone: [phone]   Fax:  [phone]  Name: James Rivas MRN: [id number] Date of Birth: [birth year]

## 2021-08-25 ENCOUNTER — Ambulatory Visit (INDEPENDENT_AMBULATORY_CARE_PROVIDER_SITE_OTHER): Payer: BC Managed Care – PPO | Admitting: Orthopedic Surgery

## 2021-08-25 ENCOUNTER — Encounter: Payer: Self-pay | Admitting: Orthopedic Surgery

## 2021-08-25 ENCOUNTER — Telehealth: Payer: Self-pay | Admitting: Radiology

## 2021-08-25 VITALS — BP 133/95 | HR 83 | Ht 77.0 in | Wt 368.6 lb

## 2021-08-25 DIAGNOSIS — M238X2 Other internal derangements of left knee: Secondary | ICD-10-CM | POA: Diagnosis not present

## 2021-08-25 DIAGNOSIS — Z9889 Other specified postprocedural states: Secondary | ICD-10-CM

## 2021-08-25 NOTE — Patient Instructions (Signed)
Will need a note for work

## 2021-08-25 NOTE — Telephone Encounter (Signed)
-----   Message from Doristine Section sent at 08/25/2021  4:00 PM EDT ----- Regarding: Discussed work/when is pt to return Patient is back with form: Kintz,Real [id number]- when is he to return?

## 2021-08-25 NOTE — Progress Notes (Signed)
Chief Complaint  Patient presents with   Knee Pain    S/p arthroscopy left knee DOS 05/17/21   Procedure arthroscopy left knee microfracture medial femoral condyle partial medial meniscectomy   Surgeon [name]   Operative findings   MEDIAL chondral defect medial femoral condyle 10 x 7 full-thickness defect, medial meniscus tear radial tear posterior horn.  James Rivas comes in 14 weeks after he is left knee arthroscopy with microfracture and partial medial meniscectomy  He is in physical therapy.  He has made some improvements as the swelling in his knee has gone down his pain is decreased the brace seems to be working out okay and his current medication regimen is working as well  However, he is not back to full strength he is continue with his physical therapy  His exam shows decreased swelling improved range of motion he still has tenderness over the medial femoral condyle from the microfracture which is healing.  We recommended that he continue his physical therapy and current medications  Lacharles is progressing well but not quite ready to return to work.  When he is it would be better for him to be in a sitdown job  See him in 2 weeks

## 2021-08-25 NOTE — Telephone Encounter (Signed)
Patient pick up form / faxed form

## 2021-08-29 ENCOUNTER — Encounter: Payer: Self-pay | Admitting: Orthopedic Surgery

## 2021-08-31 ENCOUNTER — Ambulatory Visit (HOSPITAL_COMMUNITY): Payer: BC Managed Care – PPO | Attending: Orthopedic Surgery | Admitting: Physical Therapy

## 2021-08-31 ENCOUNTER — Encounter (HOSPITAL_COMMUNITY): Payer: Self-pay | Admitting: Physical Therapy

## 2021-08-31 ENCOUNTER — Other Ambulatory Visit: Payer: Self-pay

## 2021-08-31 DIAGNOSIS — M25562 Pain in left knee: Secondary | ICD-10-CM

## 2021-08-31 DIAGNOSIS — R262 Difficulty in walking, not elsewhere classified: Secondary | ICD-10-CM | POA: Insufficient documentation

## 2021-08-31 DIAGNOSIS — G8929 Other chronic pain: Secondary | ICD-10-CM | POA: Insufficient documentation

## 2021-08-31 DIAGNOSIS — M25662 Stiffness of left knee, not elsewhere classified: Secondary | ICD-10-CM | POA: Diagnosis not present

## 2021-08-31 DIAGNOSIS — M6281 Muscle weakness (generalized): Secondary | ICD-10-CM

## 2021-08-31 NOTE — Therapy (Signed)
[facility] [address] [place], [place], [address] Phone: [phone]   Fax:  [phone]  Physical Therapy Treatment and Progress Note  Patient Details  Name: James Rivas MRN: [id number] Date of Birth: 12/11/1970 Referring Provider (PT): Arther Abbott  ROM 0-120 Left knee    Progress Note Reporting Period 07/25/21 to 08/31/21  See note below for Objective Data and Assessment of Progress/Goals.      Encounter Date: 08/31/2021   PT End of Session - 08/31/21 0819     Visit Number 20    Number of Visits 24    Date for PT Re-Evaluation 09/14/21    Authorization Type BCBS COMM PPO no auth or VL    Progress Note Due on Visit 24    PT Start Time 0748    PT Stop Time 0828    PT Time Calculation (min) 40 min    Activity Tolerance Patient tolerated treatment well    Behavior During Therapy [facility] for tasks assessed/performed             Past Medical History:  Diagnosis Date   Allergy    Arthritis    left knee   Chest pain    Chronic chest pain 06/22/2013   Gout    Gout    History of COVID-19 12/20/2020   HTN (hypertension)    Impaired fasting glucose    Morbid obesity ([facility])    Sleep apnea    uses a CPAP   Venous stasis     Past Surgical History:  Procedure Laterality Date   COLONOSCOPY WITH PROPOFOL N/A 05/11/2020   Procedure: COLONOSCOPY WITH PROPOFOL;  Surgeon: Daneil Dolin, MD;  [facility];  Service: Endoscopy;  Laterality: N/A;  2:15pm   FINGER SURGERY Left    left ring finger 2 surgeries   I & D EXTREMITY  11/29/2011   Procedure: IRRIGATION AND DEBRIDEMENT EXTREMITY;  Surgeon: Linna Hoff;  Location: [facility];  Service: Orthopedics;  Laterality: Left;  I&D Right Long and Index Fingers with Tendon Repair as Needed   KNEE ARTHROSCOPY WITH MEDIAL MENISECTOMY Left 05/17/2021   Procedure: KNEE ARTHROSCOPY WITH MICROFRACTURE;  Surgeon: Carole Civil, MD;  Location: [facility];  Service: Orthopedics;   Laterality: Left;   MENISECTOMY Left 05/17/2021   Procedure: PARTIAL MEDIAL MENISECTOMY;  Surgeon: Carole Civil, MD;  Location: [facility];  Service: Orthopedics;  Laterality: Left;    There were no vitals filed for this visit.   Subjective Assessment - 08/31/21 0750     Subjective Reports no pain. States MD said he was not ready to return to work. Reports work is going to try and find him a sitting job and will return to [place] in 2 weeks to see if he is approved for it. Reports overall he feels abotu 50-60% better.    Currently in Pain? No/denies                [facility] PT Assessment - 08/31/21 0001       Assessment   Medical Diagnosis left knee scope with medial menisectomy and medial microfracture    Referring Provider (PT) Arther Abbott    Next MD Visit 09/12/21      Observation/Other Assessments   Focus on Therapeutic Outcomes (FOTO)  67% functional status (was 28% functional status)      AROM   Left Knee Extension 0    Left Knee Flexion 120  [facility] Adult PT Treatment/Exercise - 08/31/21 0001       Knee/Hip Exercises: Aerobic   Elliptical 4 minutes level 3      Knee/Hip Exercises: Machines for Strengthening   Total Gym Leg Press SL left leg press 4x5 level 28; DL on level 34 x15      Knee/Hip Exercises: Standing   Other Standing Knee Exercises fwd slides on left leg 2x10, lateral slides on left leg 3x5    Other Standing Knee Exercises step down 4" step 4x5 L; lateral wobble board 2 minutes                     PT Education - 08/31/21 0826     Education Details on FOTO score, on HEP, on POC    Person(s) Educated Patient    Methods Explanation    Comprehension Verbalized understanding              PT Short Term Goals - 08/31/21 0756       PT SHORT TERM GOAL #1   Title Patient will demonstrate at least 2-120 degrees of left knee ROM    Baseline 0-120    Time 3    Period Weeks    Status Achieved     Target Date 07/14/21      PT SHORT TERM GOAL #2   Title Patient will report at least 50% improvement in overall symptoms and/or function to demonstrate improved functional mobility    Baseline reports 50-60% better    Time 3    Period Weeks    Status Achieved    Target Date 07/14/21      PT SHORT TERM GOAL #3   Title Patient will be independent in self management strategies to improve quality of life and functional outcomes.    Time 3    Period Weeks    Status Achieved    Target Date 07/14/21               PT Long Term Goals - 07/25/21 1020       PT LONG TERM GOAL #1   Title Patient will improve on FOTO score to meet predicted outcomes to demonstrate improved functional mobility.    Time 6    Period Weeks    Status On-going      PT LONG TERM GOAL #2   Title Patietn will be able to walk without brace or antalgic gait for at least 5 minutes to demonstrate improved functional knee strength.    Time 6    Period Weeks    Status Achieved      PT LONG TERM GOAL #3   Title Patient will report at least 75% improvement in overall symptoms and/or function to demonstrate improved functional mobility    Time 6    Period Weeks    Status On-going      PT LONG TERM GOAL #4   Title Patient will be able to go up and down the stairs with use of one railing as needed and reciprocal gait pattern to improve overall stair mobility    Time 6    Period Weeks    Status Achieved                   Plan - 08/31/21 1517     Clinical Impression Statement Patient present for a progress note. HE has met 3/3 short term goals and 2/4 long term goals. Continues to be limited with weightbearing exercise when  most weight is on left. leg. Will continue to work towards increasing strength on left leg to improve overall function. Extending POC additional 2 weeks to continue to work towards remaining goals.    Personal Factors and Comorbidities Comorbidity 1;Comorbidity 2;Fitness;Profession     Comorbidities gout, overweigh, arthritis    Examination-Activity Limitations Squat;Stairs;Stand;Transfers;Locomotion Level;Lift    Examination-Participation Restrictions Occupation;Meal Prep;Laundry;Yard Work;Community Activity;Cleaning;Shop    Stability/Clinical Decision Making Stable/Uncomplicated    Rehab Potential Good    PT Frequency 2x / week    PT Duration 2 weeks    PT Treatment/Interventions ADLs/Self Care Home Management;Aquatic Therapy;Cryotherapy;Electrical Stimulation;Moist Heat;Balance training;Therapeutic exercise;Manual techniques;Therapeutic activities;Stair training;Gait training;DME Instruction;Neuromuscular re-education;Patient/family education;Passive range of motion;Dry needling;Joint Manipulations    PT Next Visit Plan progress loading of left leg working towards full weight bearing on left with strength training and functional activities; traction for pain relief. total gym exercises    PT Home Exercise Plan heel slides, Quad set, SLR; 8/1 ham curls, hip ext, bridge, SAQs, prone quad set; 8/9:  Heel raise, lunge, step up sit to stand; 8/18 self mobilization, scar mobilization, patella mobs; 9/6 self traction; 9/12 squat pauses 9/26 vectors 9/28 lunge foward and lateral; 10/5 fwd and lateral slides, step downs on 4" step             Patient will benefit from skilled therapeutic intervention in order to improve the following deficits and impairments:  Pain, Decreased strength, Decreased activity tolerance, Difficulty walking, Decreased range of motion, Decreased balance, Abnormal gait, Decreased endurance, Decreased skin integrity, Decreased scar mobility  Visit Diagnosis: Difficulty in walking, not elsewhere classified  Chronic pain of left knee  Muscle weakness (generalized)  Decreased range of motion (ROM) of left knee     Problem List Patient Active Problem List   Diagnosis Date Noted   Mixed hyperlipidemia 07/05/2021   Lymphedema 07/05/2021    Onychomycosis 07/05/2021   Chronic pain of left knee 07/05/2021   Type 2 diabetes mellitus with morbid obesity ([place]) 06/23/2021   Gout 06/20/2021   S/P arthroscopy of left knee 05/17/21 05/25/2021   Chondral defect of condyle of left femur    Acute medial meniscus tear of left knee    Cough in adult patient 11/18/2020   Colon cancer screening 03/22/2020   Obstructive sleep apnea 01/05/2020   Morbid obesity ([place]) 10/15/2016   Allergic rhinitis 01/06/2015   Prostate hypertrophy 01/06/2015   Venous stasis dermatitis 03/23/2014   Dyspnea 05/18/2013   HTN (hypertension)    8:31 AM, 08/31/21 Jerene Pitch, DPT Physical Therapy with [facility]  [phone] office   [facility] [address] [place], [place], [address] Phone: [phone]   Fax:  [phone]  Name: James Rivas MRN: [id number] Date of Birth: 1970/12/31

## 2021-09-02 ENCOUNTER — Telehealth: Payer: Self-pay | Admitting: Orthopaedic Surgery

## 2021-09-04 ENCOUNTER — Other Ambulatory Visit: Payer: Self-pay | Admitting: Family Medicine

## 2021-09-05 ENCOUNTER — Telehealth: Payer: Self-pay | Admitting: Internal Medicine

## 2021-09-05 ENCOUNTER — Other Ambulatory Visit: Payer: Self-pay

## 2021-09-05 ENCOUNTER — Ambulatory Visit (HOSPITAL_COMMUNITY): Payer: BC Managed Care – PPO | Admitting: Physical Therapy

## 2021-09-05 DIAGNOSIS — G8929 Other chronic pain: Secondary | ICD-10-CM

## 2021-09-05 DIAGNOSIS — M25662 Stiffness of left knee, not elsewhere classified: Secondary | ICD-10-CM

## 2021-09-05 DIAGNOSIS — M25562 Pain in left knee: Secondary | ICD-10-CM

## 2021-09-05 DIAGNOSIS — R262 Difficulty in walking, not elsewhere classified: Secondary | ICD-10-CM

## 2021-09-05 DIAGNOSIS — M6281 Muscle weakness (generalized): Secondary | ICD-10-CM | POA: Diagnosis not present

## 2021-09-05 NOTE — Telephone Encounter (Signed)
Patient called in and stated that Pharmacy [facility] has requested that we send in some form of documentation stating that we are now PCP. Patient refills keep getting sent back to [facility] office

## 2021-09-05 NOTE — Therapy (Signed)
[facility] [address] [place], [place], [address] Phone: [phone]   Fax:  [phone]  Physical Therapy Treatment  Patient Details  Name: James Rivas MRN: [id number] Date of Birth: 09-02-1971 Referring Provider (PT): Fuller Canada   Encounter Date: 09/05/2021   PT End of Session - 09/05/21 0835     Visit Number 21    Number of Visits 24    Date for PT Re-Evaluation 09/14/21    Authorization Type BCBS COMM PPO no auth or VL    Progress Note Due on Visit 24    PT Start Time 0833    PT Stop Time 0911    PT Time Calculation (min) 38 min    Activity Tolerance Patient tolerated treatment well    Behavior During Therapy [facility] for tasks assessed/performed             Past Medical History:  Diagnosis Date   Allergy    Arthritis    left knee   Chest pain    Chronic chest pain 06/22/2013   Gout    Gout    History of COVID-19 12/20/2020   HTN (hypertension)    Impaired fasting glucose    Morbid obesity (HCC)    Sleep apnea    uses a CPAP   Venous stasis     Past Surgical History:  Procedure Laterality Date   COLONOSCOPY WITH PROPOFOL N/A 05/11/2020   Procedure: COLONOSCOPY WITH PROPOFOL;  Surgeon: Corbin Ade, MD;  [facility]: [facility];  Service: Endoscopy;  Laterality: N/A;  2:15pm   FINGER SURGERY Left    left ring finger 2 surgeries   I & D EXTREMITY  11/29/2011   Procedure: IRRIGATION AND DEBRIDEMENT EXTREMITY;  Surgeon: Sharma Covert;  Location: [facility] OR;  Service: Orthopedics;  Laterality: Left;  I&D Right Long and Index Fingers with Tendon Repair as Needed   KNEE ARTHROSCOPY WITH MEDIAL MENISECTOMY Left 05/17/2021   Procedure: KNEE ARTHROSCOPY WITH MICROFRACTURE;  Surgeon: Vickki Hearing, MD;  Location: [facility];  Service: Orthopedics;  Laterality: Left;   MENISECTOMY Left 05/17/2021   Procedure: PARTIAL MEDIAL MENISECTOMY;  Surgeon: Vickki Hearing, MD;  Location: [facility];  Service: Orthopedics;   Laterality: Left;    There were no vitals filed for this visit.   Subjective Assessment - 09/05/21 0834     Subjective Reports no pain and feels good.    Currently in Pain? No/denies                [facility] PT Assessment - 09/05/21 0001       Assessment   Medical Diagnosis left knee scope with medial menisectomy and medial microfracture    Referring Provider (PT) Fuller Canada    Next MD Visit 09/12/21                           [facility] Adult PT Treatment/Exercise - 09/05/21 0001       Knee/Hip Exercises: Aerobic   Elliptical 4 minutes level 5      Knee/Hip Exercises: Machines for Strengthening   Total Gym Leg Press SL left leg press 3x5 level 30; DL on level 30 W10      Knee/Hip Exercises: Standing   Lateral Step Up Left;3 sets;5 reps;Hand Hold: 1   step height 12"   Forward Step Up Left;15 reps;Hand Hold: 0   12 step 2 sets   Other Standing Knee Exercises  knee flexion L x20 5" holds; standing on left leg with 1 UE support on blue foam knee bent x3 30" holds    Other Standing Knee Exercises fwd and lateral slides x5  L                       PT Short Term Goals - 08/31/21 0756       PT SHORT TERM GOAL #1   Title Patient will demonstrate at least 2-120 degrees of left knee ROM    Baseline 0-120    Time 3    Period Weeks    Status Achieved    Target Date 07/14/21      PT SHORT TERM GOAL #2   Title Patient will report at least 50% improvement in overall symptoms and/or function to demonstrate improved functional mobility    Baseline reports 50-60% better    Time 3    Period Weeks    Status Achieved    Target Date 07/14/21      PT SHORT TERM GOAL #3   Title Patient will be independent in self management strategies to improve quality of life and functional outcomes.    Time 3    Period Weeks    Status Achieved    Target Date 07/14/21               PT Long Term Goals - 07/25/21 1020       PT LONG TERM GOAL #1   Title  Patient will improve on FOTO score to meet predicted outcomes to demonstrate improved functional mobility.    Time 6    Period Weeks    Status On-going      PT LONG TERM GOAL #2   Title Patietn will be able to walk without brace or antalgic gait for at least 5 minutes to demonstrate improved functional knee strength.    Time 6    Period Weeks    Status Achieved      PT LONG TERM GOAL #3   Title Patient will report at least 75% improvement in overall symptoms and/or function to demonstrate improved functional mobility    Time 6    Period Weeks    Status On-going      PT LONG TERM GOAL #4   Title Patient will be able to go up and down the stairs with use of one railing as needed and reciprocal gait pattern to improve overall stair mobility    Time 6    Period Weeks    Status Achieved                   Plan - 09/05/21 0835     Clinical Impression Statement Tolerated session well. No pain noted with 12 inch step ups but slight pain noted with slides on floor when patient locked out left knee. Encouraged patient to bend and stretch knee and pain resolved. Allowed for longer rest breaks and pain abolished. Will continue with current POC as tolerated by patient. Pain also noted with SLS but pain resolved after rest period.    Personal Factors and Comorbidities Comorbidity 1;Comorbidity 2;Fitness;Profession    Comorbidities gout, overweigh, arthritis    Examination-Activity Limitations Squat;Stairs;Stand;Transfers;Locomotion Level;Lift    Examination-Participation Restrictions Occupation;Meal Prep;Laundry;Yard Work;Community Activity;Cleaning;Shop    Stability/Clinical Decision Making Stable/Uncomplicated    Rehab Potential Good    PT Frequency 2x / week    PT Duration 2 weeks    PT Treatment/Interventions ADLs/Self Care  Home Management;Aquatic Therapy;Cryotherapy;Electrical Stimulation;Moist Heat;Balance training;Therapeutic exercise;Manual techniques;Therapeutic  activities;Stair training;Gait training;DME Instruction;Neuromuscular re-education;Patient/family education;Passive range of motion;Dry needling;Joint Manipulations    PT Next Visit Plan progress loading of left leg working towards full weight bearing on left with strength training and functional activities; traction for pain relief. total gym exercises    PT Home Exercise Plan heel slides, Quad set, SLR; 8/1 ham curls, hip ext, bridge, SAQs, prone quad set; 8/9:  Heel raise, lunge, step up sit to stand; 8/18 self mobilization, scar mobilization, patella mobs; 9/6 self traction; 9/12 squat pauses 9/26 vectors 9/28 lunge foward and lateral; 10/5 fwd and lateral slides, step downs on 4" step             Patient will benefit from skilled therapeutic intervention in order to improve the following deficits and impairments:  Pain, Decreased strength, Decreased activity tolerance, Difficulty walking, Decreased range of motion, Decreased balance, Abnormal gait, Decreased endurance, Decreased skin integrity, Decreased scar mobility  Visit Diagnosis: Difficulty in walking, not elsewhere classified  Chronic pain of left knee  Muscle weakness (generalized)  Decreased range of motion (ROM) of left knee     Problem List Patient Active Problem List   Diagnosis Date Noted   Mixed hyperlipidemia 07/05/2021   Lymphedema 07/05/2021   Onychomycosis 07/05/2021   Chronic pain of left knee 07/05/2021   Type 2 diabetes mellitus with morbid obesity (HCC) 06/23/2021   Gout 06/20/2021   S/P arthroscopy of left knee 05/17/21 05/25/2021   Chondral defect of condyle of left femur    Acute medial meniscus tear of left knee    Cough in adult patient 11/18/2020   Colon cancer screening 03/22/2020   Obstructive sleep apnea 01/05/2020   Morbid obesity (HCC) 10/15/2016   Allergic rhinitis 01/06/2015   Prostate hypertrophy 01/06/2015   Venous stasis dermatitis 03/23/2014   Dyspnea 05/18/2013   HTN  (hypertension)    9:11 AM, 09/05/21 Tereasa Coop, DPT Physical Therapy with [facility]  [phone] office   [facility] [address] [place], [place], [address] Phone: [phone]   Fax:  [phone]  Name: REICE BIENVENUE MRN: [id number] Date of Birth: May 13, 1971

## 2021-09-07 ENCOUNTER — Ambulatory Visit (HOSPITAL_COMMUNITY): Payer: BC Managed Care – PPO | Admitting: Physical Therapy

## 2021-09-07 ENCOUNTER — Other Ambulatory Visit: Payer: Self-pay

## 2021-09-07 ENCOUNTER — Encounter (HOSPITAL_COMMUNITY): Payer: Self-pay | Admitting: Physical Therapy

## 2021-09-07 DIAGNOSIS — R262 Difficulty in walking, not elsewhere classified: Secondary | ICD-10-CM | POA: Diagnosis not present

## 2021-09-07 DIAGNOSIS — G8929 Other chronic pain: Secondary | ICD-10-CM | POA: Diagnosis not present

## 2021-09-07 DIAGNOSIS — M6281 Muscle weakness (generalized): Secondary | ICD-10-CM

## 2021-09-07 DIAGNOSIS — M25662 Stiffness of left knee, not elsewhere classified: Secondary | ICD-10-CM | POA: Diagnosis not present

## 2021-09-07 DIAGNOSIS — M25562 Pain in left knee: Secondary | ICD-10-CM

## 2021-09-07 NOTE — Therapy (Signed)
[facility] [address] [place], [place], [address] Phone: [phone]   Fax:  [phone]  Physical Therapy Treatment and Discharge Note  Patient Details  Name: James Rivas MRN: [id number] Date of Birth: [birth year] Referring Provider (PT): Arther Abbott   ROM LEFT KNEE 0-118   PHYSICAL THERAPY DISCHARGE SUMMARY  Visits from Start of Care: 22  Current functional level related to goals / functional outcomes: See below   Remaining deficits: Strength  Education / Equipment: See below Patient agrees to discharge. Patient goals were partially met. Patient is being discharged due to being pleased with the current functional level.    Encounter Date: 09/07/2021   PT End of Session - 09/07/21 1447     Visit Number 22    Number of Visits 24    Date for PT Re-Evaluation 09/14/21    Authorization Type BCBS COMM PPO no auth or VL    Progress Note Due on Visit 24    PT Start Time 1446    PT Stop Time 1515    PT Time Calculation (min) 29 min    Activity Tolerance Patient tolerated treatment well    Behavior During Therapy [facility] for tasks assessed/performed             Past Medical History:  Diagnosis Date   Allergy    Arthritis    left knee   Chest pain    Chronic chest pain 06/22/2013   Gout    Gout    History of COVID-19 12/20/2020   HTN (hypertension)    Impaired fasting glucose    Morbid obesity (HCC)    Sleep apnea    uses a CPAP   Venous stasis     Past Surgical History:  Procedure Laterality Date   COLONOSCOPY WITH PROPOFOL N/A 05/11/2020   Procedure: COLONOSCOPY WITH PROPOFOL;  Surgeon: Daneil Dolin, MD;  [facility];  Service: Endoscopy;  Laterality: N/A;  2:15pm   FINGER SURGERY Left    left ring finger 2 surgeries   I & D EXTREMITY  11/29/2011   Procedure: IRRIGATION AND DEBRIDEMENT EXTREMITY;  Surgeon: Linna Hoff;  Location: [facility];  Service: Orthopedics;  Laterality: Left;  I&D Right  Long and Index Fingers with Tendon Repair as Needed   KNEE ARTHROSCOPY WITH MEDIAL MENISECTOMY Left 05/17/2021   Procedure: KNEE ARTHROSCOPY WITH MICROFRACTURE;  Surgeon: Carole Civil, MD;  Location: [facility];  Service: Orthopedics;  Laterality: Left;   MENISECTOMY Left 05/17/2021   Procedure: PARTIAL MEDIAL MENISECTOMY;  Surgeon: Carole Civil, MD;  Location: [facility];  Service: Orthopedics;  Laterality: Left;    There were no vitals filed for this visit.   Subjective Assessment - 09/07/21 1530     Subjective No pain noted today. Reports overall he feels about 67% better.    Currently in Pain? No/denies                [facility] PT Assessment - 09/07/21 0001       Assessment   Medical Diagnosis left knee scope with medial menisectomy and medial microfracture    Referring Provider (PT) Arther Abbott    Next MD Visit 09/12/21      Observation/Other Assessments   Focus on Therapeutic Outcomes (FOTO)  81% function      AROM   Right Knee Extension 0    Right Knee Flexion 118    Left Knee Extension 0    Left Knee  Flexion 118      Strength   Right Knee Flexion 5/5    Right Knee Extension 5/5    Left Knee Flexion 5/5    Left Knee Extension 5/5      Ambulation/Gait   Stairs Yes    Stairs Assistance 7: Independent;6: Modified independent (Device/Increase time)    Stair Management Technique One rail Right;Alternating pattern    Number of Stairs 4    Height of Stairs 7                           [facility] Adult PT Treatment/Exercise - 09/07/21 0001       Knee/Hip Exercises: Aerobic   Elliptical 4 minutes level 5                     PT Education - 09/07/21 1532     Education Details on HEP, on progressions, on ROM throughout the day, on continued healing/rehab, on transition to work.    Person(s) Educated Patient    Methods Explanation    Comprehension Verbalized understanding              PT Short Term Goals - 08/31/21 0756        PT SHORT TERM GOAL #1   Title Patient will demonstrate at least 2-120 degrees of left knee ROM    Baseline 0-120    Time 3    Period Weeks    Status Achieved    Target Date 07/14/21      PT SHORT TERM GOAL #2   Title Patient will report at least 50% improvement in overall symptoms and/or function to demonstrate improved functional mobility    Baseline reports 50-60% better    Time 3    Period Weeks    Status Achieved    Target Date 07/14/21      PT SHORT TERM GOAL #3   Title Patient will be independent in self management strategies to improve quality of life and functional outcomes.    Time 3    Period Weeks    Status Achieved    Target Date 07/14/21               PT Long Term Goals - 09/07/21 1457       PT LONG TERM GOAL #1   Title Patient will improve on FOTO score to meet predicted outcomes to demonstrate improved functional mobility.    Time 6    Period Weeks    Status Achieved      PT LONG TERM GOAL #2   Title Patietn will be able to walk without brace or antalgic gait for at least 5 minutes to demonstrate improved functional knee strength.    Time 6    Period Weeks    Status Achieved      PT LONG TERM GOAL #3   Title Patient will report at least 75% improvement in overall symptoms and/or function to demonstrate improved functional mobility    Time 6    Period Weeks    Status On-going      PT LONG TERM GOAL #4   Title Patient will be able to go up and down the stairs with use of one railing as needed and reciprocal gait pattern to improve overall stair mobility    Time 6    Period Weeks    Status Achieved  Plan - 09/07/21 1531     Clinical Impression Statement All short term goals met at this time and all but one long term goal met. Reviewed HEP and answered all questions. Discussed current presentation and plan moving forward. Discussed ROM throughout the day especially now that he will have more sedentary position  at work. Patient to discharge from PT to HEP at this time.    Personal Factors and Comorbidities Comorbidity 1;Comorbidity 2;Fitness;Profession    Comorbidities gout, overweigh, arthritis    Examination-Activity Limitations Squat;Stairs;Stand;Transfers;Locomotion Level;Lift    Examination-Participation Restrictions Occupation;Meal Prep;Laundry;Yard Work;Community Activity;Cleaning;Shop    Stability/Clinical Decision Making Stable/Uncomplicated    Rehab Potential Good    PT Frequency 2x / week    PT Duration 2 weeks    PT Treatment/Interventions ADLs/Self Care Home Management;Aquatic Therapy;Cryotherapy;Electrical Stimulation;Moist Heat;Balance training;Therapeutic exercise;Manual techniques;Therapeutic activities;Stair training;Gait training;DME Instruction;Neuromuscular re-education;Patient/family education;Passive range of motion;Dry needling;Joint Manipulations    PT Next Visit Plan DC to HEP    PT Home Exercise Plan heel slides, Quad set, SLR; 8/1 ham curls, hip ext, bridge, SAQs, prone quad set; 8/9:  Heel raise, lunge, step up sit to stand; 8/18 self mobilization, scar mobilization, patella mobs; 9/6 self traction; 9/12 squat pauses 9/26 vectors 9/28 lunge foward and lateral; 10/5 fwd and lateral slides, step downs on 4" step             Patient will benefit from skilled therapeutic intervention in order to improve the following deficits and impairments:  Pain, Decreased strength, Decreased activity tolerance, Difficulty walking, Decreased range of motion, Decreased balance, Abnormal gait, Decreased endurance, Decreased skin integrity, Decreased scar mobility  Visit Diagnosis: Difficulty in walking, not elsewhere classified  Chronic pain of left knee  Muscle weakness (generalized)  Decreased range of motion (ROM) of left knee     Problem List Patient Active Problem List   Diagnosis Date Noted   Mixed hyperlipidemia 07/05/2021   Lymphedema 07/05/2021   Onychomycosis  07/05/2021   Chronic pain of left knee 07/05/2021   Type 2 diabetes mellitus with morbid obesity ([place]) 06/23/2021   Gout 06/20/2021   S/P arthroscopy of left knee 05/17/21 05/25/2021   Chondral defect of condyle of left femur    Acute medial meniscus tear of left knee    Cough in adult patient 11/18/2020   Colon cancer screening 03/22/2020   Obstructive sleep apnea 01/05/2020   Morbid obesity ([place]) 10/15/2016   Allergic rhinitis 01/06/2015   Prostate hypertrophy 01/06/2015   Venous stasis dermatitis 03/23/2014   Dyspnea 05/18/2013   HTN (hypertension)    3:33 PM, 09/07/21 Jerene Pitch, DPT Physical Therapy with [facility]  [phone] office   [place] [address] [place], [place], [address] Phone: [phone]   Fax:  [phone]  Name: James Rivas MRN: [id number] Date of Birth: 07-21-1971

## 2021-09-12 ENCOUNTER — Encounter: Payer: Self-pay | Admitting: Orthopedic Surgery

## 2021-09-12 ENCOUNTER — Other Ambulatory Visit: Payer: Self-pay

## 2021-09-12 ENCOUNTER — Ambulatory Visit (INDEPENDENT_AMBULATORY_CARE_PROVIDER_SITE_OTHER): Payer: BC Managed Care – PPO | Admitting: Orthopedic Surgery

## 2021-09-12 VITALS — BP 126/83 | HR 74 | Ht 77.0 in | Wt 368.0 lb

## 2021-09-12 DIAGNOSIS — M238X2 Other internal derangements of left knee: Secondary | ICD-10-CM

## 2021-09-12 DIAGNOSIS — M23321 Other meniscus derangements, posterior horn of medial meniscus, right knee: Secondary | ICD-10-CM | POA: Diagnosis not present

## 2021-09-12 DIAGNOSIS — Z9889 Other specified postprocedural states: Secondary | ICD-10-CM

## 2021-09-12 NOTE — Progress Notes (Signed)
Chief Complaint  Patient presents with   Post-op Follow-up    05/17/21 left knee scope / discuss work status     50 year old male status post arthroscopy left knee medial meniscectomy microfracture is improving overall has some aching after he does his workouts and exercises for his knee  He reports no giving way symptoms no swelling and he exhibits good range of motion and strength  He can return to work performing the [employer] job with special parking privileges  I would like to see him in 6 weeks to make sure that he is still doing well with his new job activities

## 2021-09-12 NOTE — Patient Instructions (Signed)
Return to work note  Return to work for" [employer] " job with special parking privileges

## 2021-09-13 ENCOUNTER — Other Ambulatory Visit: Payer: Self-pay | Admitting: *Deleted

## 2021-09-13 MED ORDER — METOPROLOL TARTRATE 25 MG PO TABS: 12.5000 mg | ORAL_TABLET | Freq: Two times a day (BID) | ORAL | 1 refills | Status: DC

## 2021-09-15 ENCOUNTER — Telehealth: Payer: Self-pay | Admitting: Orthopaedic Surgery

## 2021-09-15 ENCOUNTER — Other Ambulatory Visit: Payer: Self-pay | Admitting: Orthopaedic Surgery

## 2021-09-15 ENCOUNTER — Other Ambulatory Visit: Payer: Self-pay | Admitting: Internal Medicine

## 2021-09-16 ENCOUNTER — Other Ambulatory Visit: Payer: Self-pay | Admitting: Internal Medicine

## 2021-09-19 ENCOUNTER — Other Ambulatory Visit: Payer: Self-pay | Admitting: *Deleted

## 2021-09-19 DIAGNOSIS — E1169 Type 2 diabetes mellitus with other specified complication: Secondary | ICD-10-CM

## 2021-09-19 MED ORDER — GLUCOSE BLOOD VI STRP
ORAL_STRIP | 12 refills | Status: DC
Start: 2021-09-19 — End: 2022-11-28

## 2021-10-05 ENCOUNTER — Other Ambulatory Visit: Payer: Self-pay

## 2021-10-05 ENCOUNTER — Ambulatory Visit (INDEPENDENT_AMBULATORY_CARE_PROVIDER_SITE_OTHER): Payer: BC Managed Care – PPO | Admitting: Internal Medicine

## 2021-10-05 ENCOUNTER — Encounter: Payer: Self-pay | Admitting: Internal Medicine

## 2021-10-05 DIAGNOSIS — E1169 Type 2 diabetes mellitus with other specified complication: Secondary | ICD-10-CM

## 2021-10-05 DIAGNOSIS — I10 Essential (primary) hypertension: Secondary | ICD-10-CM | POA: Diagnosis not present

## 2021-10-05 DIAGNOSIS — E782 Mixed hyperlipidemia: Secondary | ICD-10-CM | POA: Diagnosis not present

## 2021-10-05 MED ORDER — ENALAPRIL MALEATE 20 MG PO TABS: 20.0000 mg | ORAL_TABLET | Freq: Every day | ORAL | 1 refills | Status: DC

## 2021-10-05 MED ORDER — MOUNJARO 2.5 MG/0.5ML ~~LOC~~ SOAJ: 2.5000 mg | SUBCUTANEOUS | 0 refills | Status: DC

## 2021-10-05 MED ORDER — CHLORTHALIDONE 25 MG PO TABS: 25.0000 mg | ORAL_TABLET | Freq: Every day | ORAL | 1 refills | Status: DC

## 2021-10-05 MED ORDER — TIRZEPATIDE 5 MG/0.5ML ~~LOC~~ SOAJ: 5.0000 mg | SUBCUTANEOUS | 2 refills | Status: DC

## 2021-10-05 NOTE — Patient Instructions (Signed)
Please start using Mounjaro as discussed today. Stop taking Metformin once you receive Mounjaro.  Please continue to take other medications as prescribed.

## 2021-10-05 NOTE — Assessment & Plan Note (Signed)
Lab Results  Component Value Date   HGBA1C 6.5 (H) 06/20/2021   Switched to [facility] for additional weight loss benefit, DC Metformin Advised to follow diabetic diet On ACEi and statin F/u CMP and lipid panel Diabetic eye exam: Advised to follow up with Ophthalmology for diabetic eye exam

## 2021-10-05 NOTE — Assessment & Plan Note (Signed)
On Crestor Check lipid profile 

## 2021-10-05 NOTE — Progress Notes (Signed)
Established Patient Office Visit  Subjective:  Patient ID: James Rivas, male    DOB: [birth year]  Age: 50 y.o. MRN: [id number]  CC:  Chief Complaint  Patient presents with   Follow-up    3 month follow up pt sugars have been great would like to cut back metformin as he takes 2 per day     HPI James Rivas is a 50 y.o. male with past medical history of HTN, type II DM with morbid obesity, OSA, gout and HLD who presents for follow-up of his chronic medical conditions.  HTN: BP is well-controlled. Takes medications regularly. Patient denies headache, dizziness, chest pain, dyspnea or palpitations.  Type 2 DM: Checks blood glucose at home, ranges between 90-120. Takes medications regularly. Patient denies polyuria, polyphagia, excessive sweating, dysuria or hematuria. His HbA1C was 6.5. He is willing to switch to GLP1 agonist for additional weight loss benefit.  HLD: On Crestor.     Past Medical History:  Diagnosis Date   Allergy    Arthritis    left knee   Chest pain    Chronic chest pain 06/22/2013   Gout    Gout    History of COVID-19 12/20/2020   HTN (hypertension)    Impaired fasting glucose    Morbid obesity (HCC)    Sleep apnea    uses a CPAP   Venous stasis     Past Surgical History:  Procedure Laterality Date   COLONOSCOPY WITH PROPOFOL N/A 05/11/2020   Procedure: COLONOSCOPY WITH PROPOFOL;  Surgeon: Daneil Dolin, MD;  [facility];  Service: Endoscopy;  Laterality: N/A;  2:15pm   FINGER SURGERY Left    left ring finger 2 surgeries   I & D EXTREMITY  11/29/2011   Procedure: IRRIGATION AND DEBRIDEMENT EXTREMITY;  Surgeon: Linna Hoff;  Location: [place];  Service: Orthopedics;  Laterality: Left;  I&D Right Long and Index Fingers with Tendon Repair as Needed   KNEE ARTHROSCOPY WITH MEDIAL MENISECTOMY Left 05/17/2021   Procedure: KNEE ARTHROSCOPY WITH MICROFRACTURE;  Surgeon: Carole Civil, MD;  Location: [facility];  Service: Orthopedics;  Laterality:  Left;   MENISECTOMY Left 05/17/2021   Procedure: PARTIAL MEDIAL MENISECTOMY;  Surgeon: Carole Civil, MD;  Location: [facility];  Service: Orthopedics;  Laterality: Left;    Family History  Problem Relation Age of Onset   Diabetes Father    Diabetes Paternal Aunt    Colon cancer Neg Hx     Social History   Socioeconomic History   Marital status: Married    Spouse name: Not on file   Number of children: 3   Years of education: Not on file   Highest education level: Not on file  Occupational History   Occupation: [employer]    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 1 beer or a glass of wine a couple times a week.    Drug use: No   Sexual activity: Never  Other Topics Concern   Not on file  Social History Narrative   Not on file   Social Determinants of Health   Financial Resource Strain: Not on file  Food Insecurity: Not on file  Transportation Needs: Not on file  Physical Activity: Not on file  Stress: Not on file  Social Connections: Not on file  Intimate Partner Violence: Not on file    Outpatient Medications Prior to Visit  Medication Sig Dispense Refill   allopurinol (ZYLOPRIM) 300 MG tablet TAKE 1 TABLET(300 MG) BY MOUTH DAILY 30 tablet 5   blood glucose meter kit and supplies Dispense based on patient and insurance preference. Use daily to check blood sugar.. (FOR ICD-10 E10.9, E11.9). 1 each 0   glucose blood test strip Use daily to check blood sugar. 100 each 12   methocarbamol (ROBAXIN) 500 MG tablet Take 1 tablet (500 mg total) by mouth 3 (three) times daily. 21 tablet 0   metoprolol tartrate (LOPRESSOR) 25 MG tablet Take 0.5 tablets (12.5 mg total) by mouth 2 (two) times daily. TAKE 1/2 TABLET(12.5 MG) BY MOUTH TWICE DAILY 90 tablet 1   naproxen (NAPROSYN) 500 MG tablet TAKE 1  TABLET(500 MG) BY MOUTH TWICE DAILY WITH A MEAL 60 tablet 5   rosuvastatin (CRESTOR) 10 MG tablet Take 1 tablet (10 mg total) by mouth daily. 90 tablet 1   tiZANidine (ZANAFLEX) 4 MG tablet Take 1 tablet (4 mg total) by mouth every 6 (six) hours as needed for muscle spasms. 30 tablet 0   chlorthalidone (HYGROTON) 25 MG tablet TAKE 1 TABLET(25 MG) BY MOUTH DAILY 90 tablet 1   enalapril (VASOTEC) 20 MG tablet TAKE 1 TABLET(20 MG) BY MOUTH DAILY 30 tablet 0   metFORMIN (GLUCOPHAGE) 500 MG tablet Take 1 tablet (500 mg total) by mouth 2 (two) times daily with a meal. 180 tablet 3   No facility-administered medications prior to visit.    No Known Allergies  ROS Review of Systems  Constitutional:  Negative for chills and fever.  HENT:  Negative for congestion and sore throat.   Eyes:  Negative for pain and discharge.  Respiratory:  Negative for cough and shortness of breath.   Cardiovascular:  Negative for chest pain and palpitations.  Gastrointestinal:  Negative for constipation, diarrhea, nausea and vomiting.  Endocrine: Negative for polydipsia and polyuria.  Genitourinary:  Negative for dysuria and hematuria.  Musculoskeletal:  Positive for arthralgias. Negative for neck pain and neck stiffness.  Skin:  Negative for rash.  Neurological:  Negative for dizziness, weakness, numbness and headaches.  Psychiatric/Behavioral:  Negative for agitation and behavioral problems.      Objective:    Physical Exam Vitals reviewed.  Constitutional:      General: He is not in acute distress.    Appearance: He is obese. He is not diaphoretic.  HENT:     Head: Normocephalic and atraumatic.     Nose: Nose normal.     Mouth/Throat:     Mouth: Mucous membranes are moist.  Eyes:     General: No scleral icterus.    Extraocular Movements: Extraocular movements intact.  Cardiovascular:     Rate and Rhythm: Normal rate and regular rhythm.     Pulses: Normal pulses.     Heart sounds: Normal heart  sounds. No murmur heard. Pulmonary:     Breath sounds: Normal breath sounds. No wheezing or rales.  Musculoskeletal:     Cervical back: Neck supple. No tenderness.     Right lower leg: No edema.     Left lower leg: No edema.  Skin:    General: Skin is warm.     Findings: Rash (stasis dermatitis b/l) present.  Neurological:     General: No focal deficit present.     Mental Status: He is alert and oriented to  person, place, and time.     Sensory: No sensory deficit.     Motor: No weakness.  Psychiatric:        Mood and Affect: Mood normal.        Behavior: Behavior normal.    BP 130/78 (BP Location: Left Arm, Patient Position: Sitting, Cuff Size: Normal)   Pulse 79   Resp 18   Ht 6' 5"  (1.956 m)   Wt (!) 370 lb 1.9 oz (167.9 kg)   SpO2 97%   BMI 43.89 kg/m  Wt Readings from Last 3 Encounters:  10/05/21 (!) 370 lb 1.9 oz (167.9 kg)  09/12/21 (!) 368 lb (166.9 kg)  08/25/21 (!) 368 lb 9.6 oz (167.2 kg)    Lab Results  Component Value Date   TSH 1.670 06/20/2021   Lab Results  Component Value Date   WBC 7.8 06/20/2021   HGB 14.6 06/20/2021   HCT 44.8 06/20/2021   MCV 81 06/20/2021   PLT 207 06/20/2021   Lab Results  Component Value Date   NA 140 06/20/2021   K 5.0 06/20/2021   CO2 20 06/20/2021   GLUCOSE 116 (H) 06/20/2021   BUN 16 06/20/2021   CREATININE 0.94 06/20/2021   BILITOT 0.3 06/20/2021   ALKPHOS 60 06/20/2021   AST 24 06/20/2021   ALT 18 06/20/2021   PROT 7.4 06/20/2021   ALBUMIN 4.5 06/20/2021   CALCIUM 9.5 06/20/2021   ANIONGAP 6 05/12/2021   EGFR 98 06/20/2021   Lab Results  Component Value Date   CHOL 196 06/20/2021   Lab Results  Component Value Date   HDL 41 06/20/2021   Lab Results  Component Value Date   LDLCALC 133 (H) 06/20/2021   Lab Results  Component Value Date   TRIG 122 06/20/2021   Lab Results  Component Value Date   CHOLHDL 4.8 02/11/2021   Lab Results  Component Value Date   HGBA1C 6.5 (H) 06/20/2021       Assessment & Plan:   Problem List Items Addressed This Visit       Cardiovascular and Mediastinum   HTN (hypertension) (Chronic)    BP Readings from Last 1 Encounters:  10/05/21 130/78  Well-controlled with Enalaprim, Chlorthalidone and Metoprolol Counseled for compliance with the medications Advised DASH diet and moderate exercise/walking, at least 150 mins/week      Relevant Medications   enalapril (VASOTEC) 20 MG tablet   chlorthalidone (HYGROTON) 25 MG tablet     Endocrine   Type 2 diabetes mellitus with morbid obesity ([place]) - Primary    Lab Results  Component Value Date   HGBA1C 6.5 (H) 06/20/2021  Switched to [facility] for additional weight loss benefit, DC Metformin Advised to follow diabetic diet On ACEi and statin F/u CMP and lipid panel Diabetic eye exam: Advised to follow up with Ophthalmology for diabetic eye exam       Relevant Medications   tirzepatide [facility]) 2.5 MG/0.5ML Pen   tirzepatide [facility]) 5 MG/0.5ML Pen (Start on 10/31/2021)   enalapril (VASOTEC) 20 MG tablet   Other Relevant Orders   CMP14+EGFR   HgB A1c     Other   Mixed hyperlipidemia    On Crestor Check lipid profile      Relevant Medications   enalapril (VASOTEC) 20 MG tablet   chlorthalidone (HYGROTON) 25 MG tablet   Other Relevant Orders   Lipid Profile    Meds ordered this encounter  Medications   tirzepatide (MOUNJARO) 2.5 MG/0.5ML Pen  Sig: Inject 2.5 mg into the skin once a week.    Dispense:  2 mL    Refill:  0   tirzepatide (MOUNJARO) 5 MG/0.5ML Pen    Sig: Inject 5 mg into the skin once a week.    Dispense:  2 mL    Refill:  2   enalapril (VASOTEC) 20 MG tablet    Sig: Take 1 tablet (20 mg total) by mouth daily.    Dispense:  90 tablet    Refill:  1   chlorthalidone (HYGROTON) 25 MG tablet    Sig: Take 1 tablet (25 mg total) by mouth daily.    Dispense:  90 tablet    Refill:  1    Follow-up: Return in about 3 months (around 01/05/2022) for HTN and DM.     Lindell Spar, MD

## 2021-10-05 NOTE — Assessment & Plan Note (Signed)
BP Readings from Last 1 Encounters:  10/05/21 130/78   Well-controlled with Enalaprim, Chlorthalidone and Metoprolol Counseled for compliance with the medications Advised DASH diet and moderate exercise/walking, at least 150 mins/week

## 2021-10-17 ENCOUNTER — Ambulatory Visit (INDEPENDENT_AMBULATORY_CARE_PROVIDER_SITE_OTHER): Payer: BC Managed Care – PPO | Admitting: Internal Medicine

## 2021-10-17 ENCOUNTER — Other Ambulatory Visit: Payer: Self-pay

## 2021-10-17 ENCOUNTER — Encounter: Payer: Self-pay | Admitting: Internal Medicine

## 2021-10-17 DIAGNOSIS — R5383 Other fatigue: Secondary | ICD-10-CM

## 2021-10-17 DIAGNOSIS — R0600 Dyspnea, unspecified: Secondary | ICD-10-CM | POA: Diagnosis not present

## 2021-10-17 NOTE — Patient Instructions (Signed)
Please get chest x-ray done at [facility].

## 2021-10-17 NOTE — Progress Notes (Signed)
Virtual Visit via Telephone Note   This visit type was conducted due to national recommendations for restrictions regarding the COVID-19 Pandemic (e.g. social distancing) in an effort to limit this patient's exposure and mitigate transmission in our community.  Due to his co-morbid illnesses, this patient is at least at moderate risk for complications without adequate follow up.  This format is felt to be most appropriate for this patient at this time.  The patient did not have access to video technology/had technical difficulties with video requiring transitioning to audio format only (telephone).  All issues noted in this document were discussed and addressed.  No physical exam could be performed with this format.  Evaluation Performed:  Follow-up visit  Date:  10/17/2021   ID:  James Rivas, DOB August 23, 1971, MRN [id number]  Patient Location: Home Provider Location: Office/Clinic  Participants: Patient Location of Patient: Home Location of Provider: Telehealth Consent was obtain for visit to be over via telehealth. I verified that I am speaking with the correct person using two identifiers.  PCP:  Lindell Spar, MD   Chief Complaint: Fatigue  History of Present Illness:    James Rivas is a 50 y.o. male who has a televisit for complaint of fatigue that started about 2 days ago.  He also has to take a deep breath every 10 to 15 minutes to help with breathing and fatigue.  He denies any fever, chills, nasal congestion, sore throat, nausea, vomiting or diarrhea.  Denies any recent sick contacts.  He has not had COVID test yet.  The patient does have symptoms concerning for COVID-19 infection (fever, chills, cough, or new shortness of breath).   Past Medical, Surgical, Social History, Allergies, and Medications have been Reviewed.  Past Medical History:  Diagnosis Date   Allergy    Arthritis    left knee   Chest pain    Chronic chest pain 06/22/2013   Gout    Gout    History  of COVID-19 12/20/2020   HTN (hypertension)    Impaired fasting glucose    Morbid obesity (HCC)    Sleep apnea    uses a CPAP   Venous stasis    Past Surgical History:  Procedure Laterality Date   COLONOSCOPY WITH PROPOFOL N/A 05/11/2020   Procedure: COLONOSCOPY WITH PROPOFOL;  Surgeon: Daneil Dolin, MD;  [facility];  Service: Endoscopy;  Laterality: N/A;  2:15pm   FINGER SURGERY Left    left ring finger 2 surgeries   I & D EXTREMITY  11/29/2011   Procedure: IRRIGATION AND DEBRIDEMENT EXTREMITY;  Surgeon: Linna Hoff;  Location: [place];  Service: Orthopedics;  Laterality: Left;  I&D Right Long and Index Fingers with Tendon Repair as Needed   KNEE ARTHROSCOPY WITH MEDIAL MENISECTOMY Left 05/17/2021   Procedure: KNEE ARTHROSCOPY WITH MICROFRACTURE;  Surgeon: Carole Civil, MD;  Location: [facility];  Service: Orthopedics;  Laterality: Left;   MENISECTOMY Left 05/17/2021   Procedure: PARTIAL MEDIAL MENISECTOMY;  Surgeon: Carole Civil, MD;  Location: [facility];  Service: Orthopedics;  Laterality: Left;     Current Meds  Medication Sig   allopurinol (ZYLOPRIM) 300 MG tablet TAKE 1 TABLET(300 MG) BY MOUTH DAILY   blood glucose meter kit and supplies Dispense based on patient and insurance preference. Use daily to check blood sugar.. (FOR ICD-10 E10.9, E11.9).   chlorthalidone (HYGROTON) 25 MG tablet Take 1 tablet (25 mg total) by mouth daily.  enalapril (VASOTEC) 20 MG tablet Take 1 tablet (20 mg total) by mouth daily.   glucose blood test strip Use daily to check blood sugar.   methocarbamol (ROBAXIN) 500 MG tablet Take 1 tablet (500 mg total) by mouth 3 (three) times daily.   metoprolol tartrate (LOPRESSOR) 25 MG tablet Take 0.5 tablets (12.5 mg total) by mouth 2 (two) times daily. TAKE 1/2 TABLET(12.5 MG) BY MOUTH TWICE DAILY   naproxen (NAPROSYN) 500 MG tablet TAKE 1 TABLET(500 MG) BY MOUTH TWICE DAILY WITH A MEAL   rosuvastatin (CRESTOR) 10 MG tablet Take 1 tablet  (10 mg total) by mouth daily.   tirzepatide [facility]) 2.5 MG/0.5ML Pen Inject 2.5 mg into the skin once a week.   [START ON 10/31/2021] tirzepatide (MOUNJARO) 5 MG/0.5ML Pen Inject 5 mg into the skin once a week.   tiZANidine (ZANAFLEX) 4 MG tablet Take 1 tablet (4 mg total) by mouth every 6 (six) hours as needed for muscle spasms.     Allergies:   Patient has no known allergies.   ROS:   Please see the history of present illness.     All other systems reviewed and are negative.   Labs/Other Tests and Data Reviewed:    Recent Labs: 06/20/2021: ALT 18; BUN 16; Creatinine, Ser 0.94; Hemoglobin 14.6; Platelets 207; Potassium 5.0; Sodium 140; TSH 1.670   Recent Lipid Panel Lab Results  Component Value Date/Time   CHOL 196 06/20/2021 10:41 AM   TRIG 122 06/20/2021 10:41 AM   HDL 41 06/20/2021 10:41 AM   CHOLHDL 4.8 02/11/2021 11:39 AM   CHOLHDL 4.0 09/26/2014 10:09 AM   LDLCALC 133 (H) 06/20/2021 10:41 AM    Wt Readings from Last 3 Encounters:  10/05/21 (!) 370 lb 1.9 oz (167.9 kg)  09/12/21 (!) 368 lb (166.9 kg)  08/25/21 (!) 368 lb 9.6 oz (167.2 kg)     ASSESSMENT & PLAN:    Fatigue Dyspnea Unrelated to activity level No recent change in medication except starting Manzano about 2 weeks ago, but his symptoms started only 2 days ago Blood glucose has been WNL at home No other systemic symptoms currently, although advised to check home COVID test as some patients do not develop fever or URTI OSA could be contributing as well, although his symptoms are more acute Check CXR May give a trial of short course of steroid if persistent symptoms  Time:   Today, I have spent 9 minutes reviewing the chart, including problem list, medications, and with the patient with telehealth technology discussing the above problems.   Medication Adjustments/Labs and Tests Ordered: Current medicines are reviewed at length with the patient today.  Concerns regarding medicines are outlined above.    Tests Ordered: No orders of the defined types were placed in this encounter.   Medication Changes: No orders of the defined types were placed in this encounter.    Note: This dictation was prepared with Dragon dictation along with smaller phrase technology. Similar sounding words can be transcribed inadequately or may not be corrected upon review. Any transcriptional errors that result from this process are unintentional.      Disposition:  Follow up  Signed, Lindell Spar, MD  10/17/2021 3:30 PM     [place]

## 2021-10-18 ENCOUNTER — Telehealth: Payer: Self-pay

## 2021-10-18 ENCOUNTER — Ambulatory Visit (HOSPITAL_COMMUNITY)
Admission: RE | Admit: 2021-10-18 | Discharge: 2021-10-18 | Disposition: A | Discharge status: Home / Self Care | Payer: BC Managed Care – PPO | Source: Ambulatory Visit | Attending: Internal Medicine | Admitting: Internal Medicine

## 2021-10-18 DIAGNOSIS — R0602 Shortness of breath: Secondary | ICD-10-CM | POA: Diagnosis not present

## 2021-10-18 DIAGNOSIS — R0600 Dyspnea, unspecified: Secondary | ICD-10-CM | POA: Insufficient documentation

## 2021-10-18 NOTE — Telephone Encounter (Signed)
Patient called to let Dr Allena Katz know his COVID test was Negative.

## 2021-10-19 ENCOUNTER — Other Ambulatory Visit: Payer: Self-pay | Admitting: Internal Medicine

## 2021-10-19 ENCOUNTER — Ambulatory Visit: Payer: BC Managed Care – PPO | Admitting: Internal Medicine

## 2021-10-19 DIAGNOSIS — I10 Essential (primary) hypertension: Secondary | ICD-10-CM

## 2021-10-19 NOTE — Telephone Encounter (Signed)
Pt had chest xrays done yesterday advised with verbal understanding

## 2021-10-24 ENCOUNTER — Ambulatory Visit (INDEPENDENT_AMBULATORY_CARE_PROVIDER_SITE_OTHER): Payer: BC Managed Care – PPO | Admitting: Orthopedic Surgery

## 2021-10-24 ENCOUNTER — Encounter: Payer: Self-pay | Admitting: Orthopedic Surgery

## 2021-10-24 ENCOUNTER — Other Ambulatory Visit: Payer: Self-pay

## 2021-10-24 DIAGNOSIS — Z9889 Other specified postprocedural states: Secondary | ICD-10-CM | POA: Diagnosis not present

## 2021-10-24 DIAGNOSIS — M238X2 Other internal derangements of left knee: Secondary | ICD-10-CM

## 2021-10-24 DIAGNOSIS — M23321 Other meniscus derangements, posterior horn of medial meniscus, right knee: Secondary | ICD-10-CM

## 2021-10-24 NOTE — Progress Notes (Signed)
FOLLOW UP   Chief Complaint  Patient presents with   Post-op Follow-up    SALK MICRO FRX 05/17/21   Procedure arthroscopy left knee microfracture medial femoral condyle partial medial meniscectomy   Surgeon [name]   Operative findings   MEDIAL chondral defect medial femoral condyle 10 x 7 full-thickness defect, medial meniscus tear radial tear posterior horn  James Rivas has returned to his employment in a new job which she is able to tolerate well.  He does get pain in the knee from standing on the concrete floors and from walking on them  The cold weather seems to bother the knee he gets good relief from ibuprofen and allopurinol  He has mild swelling  It seems that as long as he is not walking across the concrete floors for 8 to 12 hours like he was before he does well  The left knee has no effusion he has full flexion 120 degrees of flexion he still has some soreness over that microfracture site  I recommend he continue his current job duties for 3 months to allow the microfracture to heal and I will reassess him at that time  He can continue with ibuprofen and allopurinol and we added shoe inserts to try to unload the knee joint  Encounter Diagnoses  Name Primary?   S/P arthroscopy of left knee 05/17/21 Yes   Chondral defect of condyle of left femur    Derangement of posterior horn of medial meniscus of right knee

## 2021-10-24 NOTE — Patient Instructions (Signed)
CONTINUE CURRENT JOB DUTY X 3 MOS  SEE THE DR IN 3 MOS   ORTHOTICS FOR SHOES   TAKE IBUPROFEN FOR PAIN

## 2021-10-31 ENCOUNTER — Other Ambulatory Visit: Payer: Self-pay | Admitting: Internal Medicine

## 2021-10-31 DIAGNOSIS — E1169 Type 2 diabetes mellitus with other specified complication: Secondary | ICD-10-CM

## 2021-11-01 ENCOUNTER — Other Ambulatory Visit: Payer: Self-pay | Admitting: *Deleted

## 2021-11-01 ENCOUNTER — Telehealth: Payer: Self-pay

## 2021-11-01 DIAGNOSIS — E1169 Type 2 diabetes mellitus with other specified complication: Secondary | ICD-10-CM

## 2021-11-01 MED ORDER — MOUNJARO 2.5 MG/0.5ML ~~LOC~~ SOAJ: 2.5000 mg | SUBCUTANEOUS | 0 refills | Status: DC

## 2021-11-01 NOTE — Telephone Encounter (Signed)
Medication sent to pharmacy  

## 2021-11-01 NOTE — Telephone Encounter (Signed)
Patient called need med refill  tirzepatide James Rivas) 5 MG/0.5ML Pen  [facility] [address]

## 2021-11-28 ENCOUNTER — Other Ambulatory Visit: Payer: Self-pay | Admitting: Internal Medicine

## 2021-11-28 DIAGNOSIS — E1169 Type 2 diabetes mellitus with other specified complication: Secondary | ICD-10-CM

## 2021-12-08 DIAGNOSIS — G4733 Obstructive sleep apnea (adult) (pediatric): Secondary | ICD-10-CM | POA: Diagnosis not present

## 2021-12-24 ENCOUNTER — Other Ambulatory Visit: Payer: Self-pay | Admitting: Internal Medicine

## 2021-12-24 DIAGNOSIS — E1169 Type 2 diabetes mellitus with other specified complication: Secondary | ICD-10-CM

## 2021-12-27 ENCOUNTER — Telehealth: Payer: Self-pay

## 2021-12-27 NOTE — Telephone Encounter (Signed)
Patient called back and said James Rivas had him on Lantus. He saw James Rivas when Dr Allena Katz was out of the office and had this sent into his pharmacy.

## 2021-12-27 NOTE — Telephone Encounter (Signed)
Lantus is not on med list can you please find out who prescribed this for pt

## 2021-12-27 NOTE — Telephone Encounter (Signed)
Patient will find out and give our office a call back.

## 2021-12-27 NOTE — Telephone Encounter (Signed)
Patient called says his Lantus needs prior authorization , was not approved at his pharmacy  Pharmacy: [facility] [address]

## 2021-12-28 ENCOUNTER — Other Ambulatory Visit: Payer: Self-pay | Admitting: Internal Medicine

## 2021-12-28 ENCOUNTER — Telehealth: Payer: Self-pay | Admitting: Internal Medicine

## 2021-12-28 ENCOUNTER — Other Ambulatory Visit: Payer: Self-pay | Admitting: *Deleted

## 2021-12-28 DIAGNOSIS — E782 Mixed hyperlipidemia: Secondary | ICD-10-CM

## 2021-12-28 MED ORDER — ACCU-CHEK SOFTCLIX LANCETS MISC: 12 refills | Status: DC

## 2021-12-28 NOTE — Telephone Encounter (Signed)
Pt did not need lantus he needed lancets to check his blood sugar also pt had called in earlier message about mounjaro being expensive advised to print coupon off the website for mounjaro advised front to call pt pt is aware as he never heard from front staff

## 2021-12-28 NOTE — Telephone Encounter (Signed)
Pt called in regard to [facility]   States that when pt went to pick up med from pharm the med was twice as much   Pt wants to get another med coupon

## 2021-12-28 NOTE — Telephone Encounter (Signed)
Pt aware.

## 2021-12-28 NOTE — Telephone Encounter (Signed)
Pt can go to [email or web address] and print this coupon off we do not have in office please let him know

## 2022-01-03 ENCOUNTER — Telehealth: Payer: Self-pay | Admitting: Internal Medicine

## 2022-01-03 NOTE — Telephone Encounter (Signed)
Pt called in for refills on   Accu-Chek Softclix Lancets lancets

## 2022-01-03 NOTE — Telephone Encounter (Signed)
These were sent 12-28-21 with refills please have pt check at pharmacy

## 2022-01-03 NOTE — Telephone Encounter (Signed)
Called pt LVM to inform that lancets were sent in to [facility]

## 2022-01-05 ENCOUNTER — Other Ambulatory Visit: Payer: Self-pay

## 2022-01-05 ENCOUNTER — Encounter: Payer: Self-pay | Admitting: Internal Medicine

## 2022-01-05 ENCOUNTER — Ambulatory Visit: Payer: BC Managed Care – PPO | Admitting: Internal Medicine

## 2022-01-05 ENCOUNTER — Encounter: Payer: BC Managed Care – PPO | Admitting: Internal Medicine

## 2022-01-05 DIAGNOSIS — Z2821 Immunization not carried out because of patient refusal: Secondary | ICD-10-CM

## 2022-01-05 DIAGNOSIS — I8393 Asymptomatic varicose veins of bilateral lower extremities: Secondary | ICD-10-CM | POA: Diagnosis not present

## 2022-01-05 DIAGNOSIS — Z0000 Encounter for general adult medical examination without abnormal findings: Secondary | ICD-10-CM

## 2022-01-05 DIAGNOSIS — I872 Venous insufficiency (chronic) (peripheral): Secondary | ICD-10-CM

## 2022-01-05 DIAGNOSIS — R1031 Right lower quadrant pain: Secondary | ICD-10-CM

## 2022-01-05 DIAGNOSIS — E1169 Type 2 diabetes mellitus with other specified complication: Secondary | ICD-10-CM

## 2022-01-05 DIAGNOSIS — Z125 Encounter for screening for malignant neoplasm of prostate: Secondary | ICD-10-CM

## 2022-01-05 DIAGNOSIS — I10 Essential (primary) hypertension: Secondary | ICD-10-CM | POA: Diagnosis not present

## 2022-01-05 DIAGNOSIS — E782 Mixed hyperlipidemia: Secondary | ICD-10-CM

## 2022-01-05 DIAGNOSIS — Z0001 Encounter for general adult medical examination with abnormal findings: Secondary | ICD-10-CM

## 2022-01-05 LAB — POCT URINALYSIS DIP (CLINITEK)
Bilirubin, UA: NEGATIVE
Blood, UA: NEGATIVE
Glucose, UA: NEGATIVE mg/dL
Ketones, POC UA: NEGATIVE mg/dL
Leukocytes, UA: NEGATIVE
Nitrite, UA: NEGATIVE
POC PROTEIN,UA: NEGATIVE
Spec Grav, UA: 1.025 (ref 1.010–1.025)
Urobilinogen, UA: 0.2 E.U./dL
pH, UA: 6 (ref 5.0–8.0)

## 2022-01-05 MED ORDER — TIRZEPATIDE 5 MG/0.5ML ~~LOC~~ SOAJ: 5.0000 mg | SUBCUTANEOUS | 2 refills | Status: DC

## 2022-01-05 MED ORDER — ACCU-CHEK SOFTCLIX LANCETS MISC: 12 refills | Status: AC

## 2022-01-05 MED ORDER — TRIAMCINOLONE ACETONIDE 0.1 % EX OINT: 1.0000 "application " | TOPICAL_OINTMENT | Freq: Two times a day (BID) | CUTANEOUS | 0 refills | Status: DC

## 2022-01-05 NOTE — Assessment & Plan Note (Signed)
Mild bulging noticed on physical exam initially, but no visible bulging on straining (coughing) Advised to avoid heavy lifting and strenuous activity If persistent, will check CT abdomen/pelvis and/or refer to General surgery

## 2022-01-05 NOTE — Assessment & Plan Note (Signed)
BMI Readings from Last 2 Encounters:  01/05/22 44.71 kg/m  10/05/21 43.89 kg/m   Diet modification and moderate exercise advised Increased dose of Mounjaro for DM and obesity

## 2022-01-05 NOTE — Assessment & Plan Note (Signed)
Recent worsening after knee surgery Could be due to prolonged standing Advised to perform leg elevation and compression stocking Referred to Vascular surgery

## 2022-01-05 NOTE — Assessment & Plan Note (Signed)
Lab Results  Component Value Date   HGBA1C 6.5 (H) 06/20/2021   Switched to [facility] for additional weight loss benefit, will increase dose to 5 mg now Advised to follow diabetic diet On ACEi and statin F/u CMP and lipid panel Diabetic eye exam: Advised to follow up with Ophthalmology for diabetic eye exam

## 2022-01-05 NOTE — Assessment & Plan Note (Signed)
Advised to keep legs elevated when possible Kenalog cream for itching/irritation

## 2022-01-05 NOTE — Assessment & Plan Note (Signed)
On Crestor Check lipid profile 

## 2022-01-05 NOTE — Assessment & Plan Note (Signed)
BP Readings from Last 1 Encounters:  01/05/22 132/72   Well-controlled with Enalapril, Chlorthalidone and Metoprolol Counseled for compliance with the medications Advised DASH diet and moderate exercise/walking, at least 150 mins/week

## 2022-01-05 NOTE — Patient Instructions (Signed)
Please start taking Mounjaro 5 mg instead of 2.5 mg once you complete current supply.  Please continue to follow low carb diet and ambulate as tolerated.

## 2022-01-05 NOTE — Progress Notes (Signed)
Established Patient Office Visit  Subjective:  Patient ID: James Rivas, male    DOB: 09/09/1971  Age: 51 y.o. MRN: [id number]  CC:  Chief Complaint  Patient presents with   Follow-up    Follow up pt having right groin pain since 12-31-21 this is an achy pain and hurts worse when he moves     HPI James Rivas is a 51 y.o. male with past medical history of HTN, type II DM with morbid obesity, OSA, gout and HLD who presents for follow-up of his chronic medical conditions.  He complains of right groin pain for the last 3 days, which is dull, about 3/10, and is worse with movement.  He denies any recent change in bowel habit.  Denies any recent injury or fall.  Type II DM: He has started using Mounjaro, but is still taking 2.5 mg dose.  She denies any polyphagia currently.  He complains of polyuria.  Of note, he takes chlorthalidone for HTN, which could contribute to urinary frequency.  His UA in the office today was wnl.  His last PSA was also normal.  HTN: BP is well-controlled. Takes medications regularly. Patient denies headache, dizziness, chest pain, dyspnea or palpitations.  He has been having worsening leg swelling and has noticed varicose veins over his thigh area, which have been worse since his knee surgery.  He also has rash over his legs, which is chronic.   Past Medical History:  Diagnosis Date   Allergy    Arthritis    left knee   Chest pain    Chronic chest pain 06/22/2013   Gout    Gout    History of COVID-19 12/20/2020   HTN (hypertension)    Impaired fasting glucose    Morbid obesity (HCC)    Sleep apnea    uses a CPAP   Venous stasis     Past Surgical History:  Procedure Laterality Date   COLONOSCOPY WITH PROPOFOL N/A 05/11/2020   Procedure: COLONOSCOPY WITH PROPOFOL;  Surgeon: Daneil Dolin, MD;  [facility];  Service: Endoscopy;  Laterality: N/A;  2:15pm   FINGER SURGERY Left    left ring finger 2 surgeries   I & D EXTREMITY  11/29/2011    Procedure: IRRIGATION AND DEBRIDEMENT EXTREMITY;  Surgeon: Linna Hoff;  Location: [place];  Service: Orthopedics;  Laterality: Left;  I&D Right Long and Index Fingers with Tendon Repair as Needed   KNEE ARTHROSCOPY WITH MEDIAL MENISECTOMY Left 05/17/2021   Procedure: KNEE ARTHROSCOPY WITH MICROFRACTURE;  Surgeon: Carole Civil, MD;  Location: [facility];  Service: Orthopedics;  Laterality: Left;   MENISECTOMY Left 05/17/2021   Procedure: PARTIAL MEDIAL MENISECTOMY;  Surgeon: Carole Civil, MD;  Location: [facility];  Service: Orthopedics;  Laterality: Left;    Family History  Problem Relation Age of Onset   Diabetes Father    Diabetes Paternal Aunt    Colon cancer Neg Hx     Social History   Socioeconomic History   Marital status: Married    Spouse name: Not on file   Number of children: 3   Years of education: Not on file   Highest education level: Not on file  Occupational History   Occupation: [employer]    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 1 beer or a glass of wine a couple times a week.    Drug use: No   Sexual activity: Never  Other Topics Concern   Not on file  Social History Narrative   Not on file   Social Determinants of Health   Financial Resource Strain: Not on file  Food Insecurity: Not on file  Transportation Needs: Not on file  Physical Activity: Not on file  Stress: Not on file  Social Connections: Not on file  Intimate Partner Violence: Not on file    Outpatient Medications Prior to Visit  Medication Sig Dispense Refill   allopurinol (ZYLOPRIM) 300 MG tablet TAKE 1 TABLET(300 MG) BY MOUTH DAILY 30 tablet 5   blood glucose meter kit and supplies Dispense based on patient and insurance preference. Use daily to check blood sugar.. (FOR ICD-10 E10.9, E11.9). 1 each 0    chlorthalidone (HYGROTON) 25 MG tablet Take 1 tablet (25 mg total) by mouth daily. 90 tablet 1   enalapril (VASOTEC) 20 MG tablet Take 1 tablet (20 mg total) by mouth daily. 90 tablet 1   glucose blood test strip Use daily to check blood sugar. 100 each 12   methocarbamol (ROBAXIN) 500 MG tablet Take 1 tablet (500 mg total) by mouth 3 (three) times daily. 21 tablet 0   metoprolol tartrate (LOPRESSOR) 25 MG tablet Take 0.5 tablets (12.5 mg total) by mouth 2 (two) times daily. TAKE 1/2 TABLET(12.5 MG) BY MOUTH TWICE DAILY 90 tablet 1   naproxen (NAPROSYN) 500 MG tablet TAKE 1 TABLET(500 MG) BY MOUTH TWICE DAILY WITH A MEAL 60 tablet 5   rosuvastatin (CRESTOR) 10 MG tablet TAKE 1 TABLET(10 MG) BY MOUTH DAILY 90 tablet 1   tiZANidine (ZANAFLEX) 4 MG tablet Take 1 tablet (4 mg total) by mouth every 6 (six) hours as needed for muscle spasms. 30 tablet 0   Accu-Chek Softclix Lancets lancets Use as instructed 100 each 12   MOUNJARO 2.5 MG/0.5ML Pen INJECT 2.5MG UNDER THE SKIN ONCE PER WEEK 2 mL 0   tirzepatide (MOUNJARO) 5 MG/0.5ML Pen Inject 5 mg into the skin once a week. 2 mL 2   No facility-administered medications prior to visit.    No Known Allergies  ROS Review of Systems  Constitutional:  Negative for chills and fever.  HENT:  Negative for congestion and sore throat.   Eyes:  Negative for pain and discharge.  Respiratory:  Negative for cough and shortness of breath.   Cardiovascular:  Negative for chest pain and palpitations.  Gastrointestinal:  Negative for diarrhea, nausea and vomiting.       Right groin pain  Endocrine: Negative for polydipsia and polyuria.  Genitourinary:  Positive for frequency. Negative for dysuria and hematuria.  Musculoskeletal:  Positive for arthralgias. Negative for neck pain and neck stiffness.  Skin:  Positive for rash.  Neurological:  Negative for dizziness, weakness, numbness and headaches.  Psychiatric/Behavioral:  Negative for agitation and behavioral  problems.      Objective:    Physical Exam Vitals reviewed.  Constitutional:      General: He is not in acute distress.    Appearance: He is obese. He is not diaphoretic.  HENT:     Head: Normocephalic and atraumatic.     Nose: Nose normal.     Mouth/Throat:     Mouth: Mucous membranes are moist.  Eyes:     General: No scleral icterus.    Extraocular Movements: Extraocular movements intact.  Cardiovascular:  Rate and Rhythm: Normal rate and regular rhythm.     Pulses: Normal pulses.     Heart sounds: Normal heart sounds. No murmur heard. Pulmonary:     Breath sounds: Normal breath sounds. No wheezing or rales.  Abdominal:     Palpations: Abdomen is soft.     Tenderness: There is no abdominal tenderness. There is no guarding or rebound.  Musculoskeletal:     Cervical back: Neck supple. No tenderness.     Right lower leg: Edema (2+) present.     Left lower leg: Edema (2+) present.  Skin:    General: Skin is warm.     Findings: Rash (stasis dermatitis b/l) present.  Neurological:     General: No focal deficit present.     Mental Status: He is alert and oriented to person, place, and time.     Sensory: No sensory deficit.     Motor: No weakness.  Psychiatric:        Mood and Affect: Mood normal.        Behavior: Behavior normal.    BP 132/72 (BP Location: Right Arm, Patient Position: Sitting, Cuff Size: Normal)    Pulse 72    Resp 18    Ht 6' 5"  (1.956 m)    Wt (!) 377 lb (171 kg)    SpO2 97%    BMI 44.71 kg/m  Wt Readings from Last 3 Encounters:  01/05/22 (!) 377 lb (171 kg)  10/05/21 (!) 370 lb 1.9 oz (167.9 kg)  09/12/21 (!) 368 lb (166.9 kg)    Lab Results  Component Value Date   TSH 1.670 06/20/2021   Lab Results  Component Value Date   WBC 7.8 06/20/2021   HGB 14.6 06/20/2021   HCT 44.8 06/20/2021   MCV 81 06/20/2021   PLT 207 06/20/2021   Lab Results  Component Value Date   NA 140 06/20/2021   K 5.0 06/20/2021   CO2 20 06/20/2021   GLUCOSE  116 (H) 06/20/2021   BUN 16 06/20/2021   CREATININE 0.94 06/20/2021   BILITOT 0.3 06/20/2021   ALKPHOS 60 06/20/2021   AST 24 06/20/2021   ALT 18 06/20/2021   PROT 7.4 06/20/2021   ALBUMIN 4.5 06/20/2021   CALCIUM 9.5 06/20/2021   ANIONGAP 6 05/12/2021   EGFR 98 06/20/2021   Lab Results  Component Value Date   CHOL 196 06/20/2021   Lab Results  Component Value Date   HDL 41 06/20/2021   Lab Results  Component Value Date   LDLCALC 133 (H) 06/20/2021   Lab Results  Component Value Date   TRIG 122 06/20/2021   Lab Results  Component Value Date   CHOLHDL 4.8 02/11/2021   Lab Results  Component Value Date   HGBA1C 6.5 (H) 06/20/2021      Assessment & Plan:   Problem List Items Addressed This Visit       Cardiovascular and Mediastinum   HTN (hypertension) (Chronic)    BP Readings from Last 1 Encounters:  01/05/22 132/72  Well-controlled with Enalapril, Chlorthalidone and Metoprolol Counseled for compliance with the medications Advised DASH diet and moderate exercise/walking, at least 150 mins/week      Relevant Orders   CBC with Differential/Platelet   Varicose veins of both lower extremities    Recent worsening after knee surgery Could be due to prolonged standing Advised to perform leg elevation and compression stocking Referred to Vascular surgery      Relevant Medications   triamcinolone  ointment (KENALOG) 0.1 %   Other Relevant Orders   Ambulatory referral to Vascular Surgery     Endocrine   Type 2 diabetes mellitus with morbid obesity ([place]) - Primary    Lab Results  Component Value Date   HGBA1C 6.5 (H) 06/20/2021  Switched to [facility] for additional weight loss benefit, will increase dose to 5 mg now Advised to follow diabetic diet On ACEi and statin F/u CMP and lipid panel Diabetic eye exam: Advised to follow up with Ophthalmology for diabetic eye exam       Relevant Medications   tirzepatide [facility]) 5 MG/0.5ML Pen   Accu-Chek  Softclix Lancets lancets   Other Relevant Orders   Hemoglobin A1c   CMP14+EGFR     Musculoskeletal and Integument   Venous stasis dermatitis    Advised to keep legs elevated when possible Kenalog cream for itching/irritation        Other   Morbid obesity ([place])    BMI Readings from Last 2 Encounters:  01/05/22 44.71 kg/m  10/05/21 43.89 kg/m  Diet modification and moderate exercise advised Increased dose of Mounjaro for DM and obesity       Relevant Medications   tirzepatide (MOUNJARO) 5 MG/0.5ML Pen   Mixed hyperlipidemia    On Crestor Check lipid profile      Relevant Orders   Lipid Profile   Right inguinal pain    Mild bulging noticed on physical exam initially, but no visible bulging on straining (coughing) Advised to avoid heavy lifting and strenuous activity If persistent, will check CT abdomen/pelvis and/or refer to General surgery      Relevant Orders   POCT URINALYSIS DIP (CLINITEK) (Completed)   Other Visit Diagnoses     Annual physical exam       Relevant Orders   VITAMIN D 25 Hydroxy (Vit-D Deficiency, Fractures)   TSH   PSA   CBC with Differential/Platelet   Screening for prostate cancer       Relevant Orders   PSA   Refused influenza vaccine           Meds ordered this encounter  Medications   tirzepatide (MOUNJARO) 5 MG/0.5ML Pen    Sig: Inject 5 mg into the skin once a week.    Dispense:  2 mL    Refill:  2    Dose change   Accu-Chek Softclix Lancets lancets    Sig: Use as instructed    Dispense:  100 each    Refill:  12    Okay to provide insurance preferred brand.   triamcinolone ointment (KENALOG) 0.1 %    Sig: Apply 1 application topically 2 (two) times daily.    Dispense:  30 g    Refill:  0    Follow-up: Return in about 4 months (around 05/05/2022) for Annual physical.    Lindell Spar, MD

## 2022-01-23 ENCOUNTER — Encounter: Payer: Self-pay | Admitting: Orthopedic Surgery

## 2022-01-23 ENCOUNTER — Other Ambulatory Visit: Payer: Self-pay

## 2022-01-23 ENCOUNTER — Ambulatory Visit (INDEPENDENT_AMBULATORY_CARE_PROVIDER_SITE_OTHER): Payer: BC Managed Care – PPO | Admitting: Orthopedic Surgery

## 2022-01-23 DIAGNOSIS — M238X2 Other internal derangements of left knee: Secondary | ICD-10-CM | POA: Diagnosis not present

## 2022-01-23 DIAGNOSIS — G8929 Other chronic pain: Secondary | ICD-10-CM | POA: Diagnosis not present

## 2022-01-23 DIAGNOSIS — Z9889 Other specified postprocedural states: Secondary | ICD-10-CM | POA: Diagnosis not present

## 2022-01-23 DIAGNOSIS — M25562 Pain in left knee: Secondary | ICD-10-CM

## 2022-01-23 NOTE — Progress Notes (Signed)
Chief Complaint  Patient presents with   Knee Pain    Left 05/17/21   Mr. Bartolo had arthroscopy left knee microfracture on May 17, 2021 with partial medial meniscectomy with a microfracture of the medial femoral condyle he is in stable condition his knee does ache when the weather changes and he does have some discomfort when he is standing for long periods of time  He has maintained good strength in his quadriceps full extension 125 degrees of knee flexion  I will follow him on an annual basis with x-rays as he is at risk for total knee

## 2022-01-26 ENCOUNTER — Encounter: Payer: Self-pay | Admitting: *Deleted

## 2022-01-26 DIAGNOSIS — Z2821 Immunization not carried out because of patient refusal: Secondary | ICD-10-CM | POA: Insufficient documentation

## 2022-01-28 ENCOUNTER — Other Ambulatory Visit: Payer: Self-pay | Admitting: Internal Medicine

## 2022-01-28 DIAGNOSIS — I8393 Asymptomatic varicose veins of bilateral lower extremities: Secondary | ICD-10-CM

## 2022-02-13 ENCOUNTER — Other Ambulatory Visit: Payer: Self-pay

## 2022-02-13 ENCOUNTER — Encounter: Payer: Self-pay | Admitting: Internal Medicine

## 2022-02-13 ENCOUNTER — Ambulatory Visit (INDEPENDENT_AMBULATORY_CARE_PROVIDER_SITE_OTHER): Payer: BC Managed Care – PPO | Admitting: Internal Medicine

## 2022-02-13 VITALS — BP 128/86 | HR 76 | Resp 18 | Ht 77.0 in | Wt 375.0 lb

## 2022-02-13 DIAGNOSIS — E1169 Type 2 diabetes mellitus with other specified complication: Secondary | ICD-10-CM

## 2022-02-13 DIAGNOSIS — K219 Gastro-esophageal reflux disease without esophagitis: Secondary | ICD-10-CM | POA: Diagnosis not present

## 2022-02-13 DIAGNOSIS — E559 Vitamin D deficiency, unspecified: Secondary | ICD-10-CM | POA: Diagnosis not present

## 2022-02-13 DIAGNOSIS — Z23 Encounter for immunization: Secondary | ICD-10-CM

## 2022-02-13 DIAGNOSIS — Z0001 Encounter for general adult medical examination with abnormal findings: Secondary | ICD-10-CM | POA: Diagnosis not present

## 2022-02-13 MED ORDER — OMEPRAZOLE 20 MG PO CPDR: 20.0000 mg | DELAYED_RELEASE_CAPSULE | Freq: Every day | ORAL | 5 refills | Status: AC

## 2022-02-13 NOTE — Patient Instructions (Signed)
Please continue to take medications as prescribed. ? ?Please continue to follow low carb diet and perform moderate exercise/walking at least 150 mins/week. ? ?Please try to follow small, frequent meals to avoid bloating caused by Mounjaro. ?

## 2022-02-15 ENCOUNTER — Telehealth: Payer: Self-pay | Admitting: Orthopedic Surgery

## 2022-02-15 ENCOUNTER — Encounter: Payer: Self-pay | Admitting: Orthopedic Surgery

## 2022-02-15 ENCOUNTER — Other Ambulatory Visit: Payer: Self-pay | Admitting: Internal Medicine

## 2022-02-15 ENCOUNTER — Encounter: Payer: Self-pay | Admitting: Internal Medicine

## 2022-02-15 DIAGNOSIS — E782 Mixed hyperlipidemia: Secondary | ICD-10-CM

## 2022-02-15 LAB — CBC WITH DIFFERENTIAL/PLATELET
Basophils Absolute: 0 10*3/uL (ref 0.0–0.2)
Basos: 1 %
EOS (ABSOLUTE): 0.3 10*3/uL (ref 0.0–0.4)
Eos: 3 %
Hematocrit: 44.6 % (ref 37.5–51.0)
Hemoglobin: 14.2 g/dL (ref 13.0–17.7)
Immature Grans (Abs): 0 10*3/uL (ref 0.0–0.1)
Immature Granulocytes: 0 %
Lymphocytes Absolute: 2.6 10*3/uL (ref 0.7–3.1)
Lymphs: 31 %
MCH: 24.7 pg — ABNORMAL LOW (ref 26.6–33.0)
MCHC: 31.8 g/dL (ref 31.5–35.7)
MCV: 78 fL — ABNORMAL LOW (ref 79–97)
Monocytes Absolute: 0.7 10*3/uL (ref 0.1–0.9)
Monocytes: 9 %
Neutrophils Absolute: 4.8 10*3/uL (ref 1.4–7.0)
Neutrophils: 56 %
Platelets: 246 10*3/uL (ref 150–450)
RBC: 5.75 x10E6/uL (ref 4.14–5.80)
RDW: 14.5 % (ref 11.6–15.4)
WBC: 8.4 10*3/uL (ref 3.4–10.8)

## 2022-02-15 LAB — CMP14+EGFR
ALT: 18 IU/L (ref 0–44)
AST: 21 IU/L (ref 0–40)
Albumin/Globulin Ratio: 1.5 (ref 1.2–2.2)
Albumin: 4.5 g/dL (ref 3.8–4.9)
Alkaline Phosphatase: 67 IU/L (ref 44–121)
BUN/Creatinine Ratio: 20 (ref 9–20)
BUN: 20 mg/dL (ref 6–24)
Bilirubin Total: 0.4 mg/dL (ref 0.0–1.2)
CO2: 24 mmol/L (ref 20–29)
Calcium: 9.3 mg/dL (ref 8.7–10.2)
Chloride: 99 mmol/L (ref 96–106)
Creatinine, Ser: 1.02 mg/dL (ref 0.76–1.27)
Globulin, Total: 3.1 g/dL (ref 1.5–4.5)
Glucose: 53 mg/dL — ABNORMAL LOW (ref 70–99)
Potassium: 4.9 mmol/L (ref 3.5–5.2)
Sodium: 140 mmol/L (ref 134–144)
Total Protein: 7.6 g/dL (ref 6.0–8.5)
eGFR: 88 mL/min/{1.73_m2} (ref >59)

## 2022-02-15 LAB — HEMOGLOBIN A1C
Est. average glucose Bld gHb Est-mCnc: 126 mg/dL
Hgb A1c MFr Bld: 6 % — ABNORMAL HIGH (ref 4.8–5.6)

## 2022-02-15 LAB — LIPID PANEL
Chol/HDL Ratio: 2.7 ratio (ref 0.0–5.0)
Cholesterol, Total: 119 mg/dL (ref 100–199)
HDL: 44 mg/dL (ref >39)
LDL Chol Calc (NIH): 59 mg/dL (ref 0–99)
Triglycerides: 80 mg/dL (ref 0–149)
VLDL Cholesterol Cal: 16 mg/dL (ref 5–40)

## 2022-02-15 LAB — VITAMIN D 25 HYDROXY (VIT D DEFICIENCY, FRACTURES): Vit D, 25-Hydroxy: 12.9 ng/mL — ABNORMAL LOW (ref 30.0–100.0)

## 2022-02-15 LAB — TSH: TSH: 2.17 u[IU]/mL (ref 0.450–4.500)

## 2022-02-15 MED ORDER — ROSUVASTATIN CALCIUM 10 MG PO TABS: 10.0000 mg | ORAL_TABLET | Freq: Every day | ORAL | 3 refills | Status: DC

## 2022-02-15 MED ORDER — ROSUVASTATIN CALCIUM 20 MG PO TABS: 20.0000 mg | ORAL_TABLET | Freq: Every day | ORAL | 1 refills | Status: DC

## 2022-02-15 NOTE — Telephone Encounter (Signed)
Approved.  

## 2022-02-15 NOTE — Telephone Encounter (Signed)
Patient aware, picking up note today. ?

## 2022-02-15 NOTE — Telephone Encounter (Signed)
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 States employer is trying to move him back to his other job, which he did try again, but which is difficult to perform due to medical reason of knee. ?

## 2022-02-17 NOTE — Progress Notes (Signed)
? ?Established Patient Office Visit ? ?Subjective:  ?Patient ID: James Rivas, male    DOB: July 08, 1971  Age: 51 y.o. MRN: [id number] ? ?CC:  ?Chief Complaint  ?Patient presents with  ? Annual Exam  ?  Annual exam   ? ? ?HPI ?James Rivas is a 51 y.o. male with past medical history of HTN, type II DM with morbid obesity, OSA, gout and HLD who presents for annual physical. ? ?Type II DM: He complains of bloating with Mounjaro.  He agrees to follow small, frequent meals to avoid bloating/gas sensation.  He denies any polyuria or polyphagia currently. ? ?HTN: BP is well-controlled. Takes medications regularly. Patient denies headache, dizziness, chest pain, dyspnea or palpitations. ? ?He received first dose of Shingrix vaccine in the office today. ? ?Past Medical History:  ?Diagnosis Date  ? Allergy   ? Arthritis   ? left knee  ? Chest pain   ? Chronic chest pain 06/22/2013  ? Gout   ? Gout   ? History of COVID-19 12/20/2020  ? HTN (hypertension)   ? Impaired fasting glucose   ? Morbid obesity (James Rivas)   ? Sleep apnea   ? uses a CPAP  ? Venous stasis   ? ? ?Past Surgical History:  ?Procedure Laterality Date  ? COLONOSCOPY WITH PROPOFOL N/A 05/11/2020  ? Procedure: COLONOSCOPY WITH PROPOFOL;  Surgeon: Daneil Dolin, MD;  [facility];  Service: Endoscopy;  Laterality: N/A;  2:15pm  ? FINGER SURGERY Left   ? left ring finger 2 surgeries  ? I & D EXTREMITY  11/29/2011  ? Procedure: IRRIGATION AND DEBRIDEMENT EXTREMITY;  Surgeon: Linna Hoff;  Location: [place];  Service: Orthopedics;  Laterality: Left;  I&D Right Long and Index Fingers with Tendon Repair as Needed  ? KNEE ARTHROSCOPY WITH MEDIAL MENISECTOMY Left 05/17/2021  ? Procedure: KNEE ARTHROSCOPY WITH MICROFRACTURE;  Surgeon: Carole Civil, MD;  Location[facility];  Service: Orthopedics;  Laterality: Left;  ? MENISECTOMY Left 05/17/2021  ? Procedure: PARTIAL MEDIAL MENISECTOMY;  Surgeon: Carole Civil, MD;  Location: [facility];  Service: Orthopedics;   Laterality: Left;  ? ? ?Family History  ?Problem Relation Age of Onset  ? Diabetes Father   ? Diabetes Paternal Aunt   ? Colon cancer Neg Hx   ? ? ?Social History  ? ?Socioeconomic History  ? Marital status: Married  ?  Spouse name: Not on file  ? Number of children: 3  ? Years of education: Not on file  ? Highest education level: Not on file  ?Occupational History  ? Occupation: [employer]  ?  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 Smoking status: Never  ? Smokeless tobacco: Never  ?Vaping Use  ? Vaping Use: Never used  ?Substance and Sexual Activity  ? Alcohol use: Not Currently  ?  Comment: occ. 1 beer or a glass of wine a couple times a week.   ? Drug use: No  ? Sexual activity: Never  ?Other Topics Concern  ? Not on file  ?Social History Narrative  ? Not on file  ? ?Social Determinants of Health  ? ?Financial Resource Strain: Not on file  ?Food Insecurity: Not on file  ?Transportation Needs: Not on file  ?Physical Activity: Not on file  ?Stress: Not on file  ?Social Connections: Not on file  ?Intimate Partner Violence: Not on file  ? ? ?Outpatient Medications Prior to Visit  ?Medication Sig Dispense Refill  ?  Accu-Chek Softclix Lancets lancets Use as instructed 100 each 12  ? allopurinol (ZYLOPRIM) 300 MG tablet TAKE 1 TABLET(300 MG) BY MOUTH DAILY 30 tablet 5  ? blood glucose meter kit and supplies Dispense based on patient and insurance preference. Use daily to check blood sugar.. (FOR ICD-10 E10.9, E11.9). 1 each 0  ? chlorthalidone (HYGROTON) 25 MG tablet Take 1 tablet (25 mg total) by mouth daily. 90 tablet 1  ? enalapril (VASOTEC) 20 MG tablet Take 1 tablet (20 mg total) by mouth daily. 90 tablet 1  ? glucose blood test strip Use daily to check blood sugar. 100 each 12  ? methocarbamol (ROBAXIN) 500 MG tablet Take 1 tablet (500 mg total) by mouth 3 (three) times daily. 21 tablet 0  ? metoprolol tartrate (LOPRESSOR) 25 MG tablet Take 0.5 tablets (12.5 mg total) by mouth 2 (two) times daily. TAKE 1/2  TABLET(12.5 MG) BY MOUTH TWICE DAILY 90 tablet 1  ? naproxen (NAPROSYN) 500 MG tablet TAKE 1 TABLET(500 MG) BY MOUTH TWICE DAILY WITH A MEAL 60 tablet 5  ? tirzepatide (MOUNJARO) 5 MG/0.5ML Pen Inject 5 mg into the skin once a week. 2 mL 2  ? tiZANidine (ZANAFLEX) 4 MG tablet Take 1 tablet (4 mg total) by mouth every 6 (six) hours as needed for muscle spasms. 30 tablet 0  ? triamcinolone ointment (KENALOG) 0.1 % APPLY TOPICALLY TO THE AFFECTED AREA TWICE DAILY 30 g 0  ? rosuvastatin (CRESTOR) 10 MG tablet TAKE 1 TABLET(10 MG) BY MOUTH DAILY 90 tablet 1  ? ?No facility-administered medications prior to visit.  ? ? ?No Known Allergies ? ?ROS ?Review of Systems  ?Constitutional:  Negative for chills and fever.  ?HENT:  Negative for congestion and sore throat.   ?Eyes:  Negative for pain and discharge.  ?Respiratory:  Negative for cough and shortness of breath.   ?Cardiovascular:  Negative for chest pain and palpitations.  ?Gastrointestinal:  Negative for constipation, diarrhea, nausea and vomiting.  ?Endocrine: Negative for polydipsia and polyuria.  ?Genitourinary:  Negative for dysuria and hematuria.  ?Musculoskeletal:  Positive for arthralgias. Negative for neck pain and neck stiffness.  ?Skin:  Negative for rash.  ?Neurological:  Negative for dizziness, weakness, numbness and headaches.  ?Psychiatric/Behavioral:  Negative for agitation and behavioral problems.   ? ?  ?Objective:  ?  ?Physical Exam ?Vitals reviewed.  ?Constitutional:   ?   General: He is not in acute distress. ?   Appearance: He is obese. He is not diaphoretic.  ?HENT:  ?   Head: Normocephalic and atraumatic.  ?   Nose: Nose normal.  ?   Mouth/Throat:  ?   Mouth: Mucous membranes are moist.  ?Eyes:  ?   General: No scleral icterus. ?   Extraocular Movements: Extraocular movements intact.  ?Cardiovascular:  ?   Rate and Rhythm: Normal rate and regular rhythm.  ?   Pulses: Normal pulses.  ?   Heart sounds: Normal heart sounds. No murmur  heard. ?Pulmonary:  ?   Breath sounds: Normal breath sounds. No wheezing or rales.  ?Abdominal:  ?   Palpations: Abdomen is soft.  ?   Tenderness: There is no abdominal tenderness.  ?Musculoskeletal:  ?   Cervical back: Neck supple. No tenderness.  ?   Right lower leg: No edema.  ?   Left lower leg: No edema.  ?Skin: ?   General: Skin is warm.  ?   Findings: Rash (stasis dermatitis b/l) present.  ?Neurological:  ?  General: No focal deficit present.  ?   Mental Status: He is alert and oriented to person, place, and time.  ?   Cranial Nerves: No cranial nerve deficit.  ?   Sensory: No sensory deficit.  ?   Motor: No weakness.  ?Psychiatric:     ?   Mood and Affect: Mood normal.     ?   Behavior: Behavior normal.  ? ? ?BP 128/86 (BP Location: Left Arm, Patient Position: Sitting, Cuff Size: Normal)   Pulse 76   Resp 18   Ht 6' 5"  (1.956 m)   Wt (!) 375 lb (170.1 kg)   SpO2 96%   BMI 44.47 kg/m?  ?Wt Readings from Last 3 Encounters:  ?02/13/22 (!) 375 lb (170.1 kg)  ?01/05/22 (!) 377 lb (171 kg)  ?10/05/21 (!) 370 lb 1.9 oz (167.9 kg)  ? ? ?Lab Results  ?Component Value Date  ? TSH 2.170 02/13/2022  ? ?Lab Results  ?Component Value Date  ? WBC 8.4 02/13/2022  ? HGB 14.2 02/13/2022  ? HCT 44.6 02/13/2022  ? MCV 78 (L) 02/13/2022  ? PLT 246 02/13/2022  ? ?Lab Results  ?Component Value Date  ? NA 140 02/13/2022  ? K 4.9 02/13/2022  ? CO2 24 02/13/2022  ? GLUCOSE 53 (L) 02/13/2022  ? BUN 20 02/13/2022  ? CREATININE 1.02 02/13/2022  ? BILITOT 0.4 02/13/2022  ? ALKPHOS 67 02/13/2022  ? AST 21 02/13/2022  ? ALT 18 02/13/2022  ? PROT 7.6 02/13/2022  ? ALBUMIN 4.5 02/13/2022  ? CALCIUM 9.3 02/13/2022  ? ANIONGAP 6 05/12/2021  ? EGFR 88 02/13/2022  ? ?Lab Results  ?Component Value Date  ? CHOL 119 02/13/2022  ? ?Lab Results  ?Component Value Date  ? HDL 44 02/13/2022  ? ?Lab Results  ?Component Value Date  ? [place] 59 02/13/2022  ? ?Lab Results  ?Component Value Date  ? TRIG 80 02/13/2022  ? ?Lab Results  ?Component Value  Date  ? CHOLHDL 2.7 02/13/2022  ? ?Lab Results  ?Component Value Date  ? HGBA1C 6.0 (H) 02/13/2022  ? ? ?  ?Assessment & Plan:  ? ?Problem List Items Addressed This Visit   ? ?  ? Endocrine  ? Type 2 diabetes mellitus with mor

## 2022-02-17 NOTE — Assessment & Plan Note (Signed)
Physical exam as documented. ?Fasting blood tests ordered today. ?

## 2022-02-17 NOTE — Assessment & Plan Note (Signed)
Lab Results  ?Component Value Date  ? HGBA1C 6.0 (H) 02/13/2022  ? ?Contunue Mounjaro for additional weight loss benefit, 5 mg now ?Advised to follow diabetic diet ?On ACEi and statin ?F/u CMP and lipid panel ?Diabetic eye exam: Advised to follow up with Ophthalmology for diabetic eye exam ?

## 2022-02-17 NOTE — Assessment & Plan Note (Signed)
BMI Readings from Last 2 Encounters:  ?02/13/22 44.47 kg/m?  ?01/05/22 44.71 kg/m?  ? ?Diet modification and moderate exercise advised ?Continue Mounjaro for DM and obesity ?

## 2022-02-23 ENCOUNTER — Encounter: Payer: BC Managed Care – PPO | Admitting: Vascular Surgery

## 2022-02-23 ENCOUNTER — Encounter (HOSPITAL_COMMUNITY): Payer: BC Managed Care – PPO

## 2022-02-23 LAB — HM DIABETES EYE EXAM

## 2022-02-24 ENCOUNTER — Other Ambulatory Visit: Payer: Self-pay | Admitting: *Deleted

## 2022-02-24 ENCOUNTER — Encounter: Payer: Self-pay | Admitting: *Deleted

## 2022-02-24 DIAGNOSIS — I8393 Asymptomatic varicose veins of bilateral lower extremities: Secondary | ICD-10-CM

## 2022-03-01 ENCOUNTER — Encounter: Payer: Self-pay | Admitting: Vascular Surgery

## 2022-03-01 ENCOUNTER — Ambulatory Visit (INDEPENDENT_AMBULATORY_CARE_PROVIDER_SITE_OTHER): Payer: BC Managed Care – PPO | Admitting: Vascular Surgery

## 2022-03-01 ENCOUNTER — Ambulatory Visit (HOSPITAL_COMMUNITY)
Admission: RE | Admit: 2022-03-01 | Discharge: 2022-03-01 | Disposition: A | Discharge status: Home / Self Care | Payer: BC Managed Care – PPO | Source: Ambulatory Visit | Attending: Vascular Surgery | Admitting: Vascular Surgery

## 2022-03-01 VITALS — BP 120/70 | HR 84 | Temp 98.6°F | Resp 18 | Ht 77.0 in | Wt 367.0 lb

## 2022-03-01 DIAGNOSIS — I872 Venous insufficiency (chronic) (peripheral): Secondary | ICD-10-CM | POA: Diagnosis not present

## 2022-03-01 DIAGNOSIS — I8393 Asymptomatic varicose veins of bilateral lower extremities: Secondary | ICD-10-CM | POA: Insufficient documentation

## 2022-03-01 DIAGNOSIS — I83813 Varicose veins of bilateral lower extremities with pain: Secondary | ICD-10-CM

## 2022-03-01 NOTE — Progress Notes (Signed)
? ?ASSESSMENT & PLAN  ? ?CHRONIC VENOUS INSUFFICIENCY: This patient has CEAP C4b venous disease (hyperpigmentation with lipodermatosclerosis).  Currently however his symptoms were not especially bothersome.  He is having some problems with his left knee and has had repair of a torn meniscus in June of last year.  We have discussed the importance of intermittent leg elevation and the proper positioning for this.  In addition I encouraged him to continue to wear his knee-high compression stockings with a gradient of 15 to 20 mmHg.  If his symptoms progress I think he would be a good candidate for a thigh-high stocking with a gradient of 20 to 30 mmHg.  I have encouraged him to avoid prolonged sitting and standing.  He sits at work for 12 hours and I encouraged him to get up every hour and walk around to help empty his venous system.  We have also discussed the importance of exercise pacifically walking and water aerobics.  I think water aerobics would be especially helpful given that he is having some problems with his left knee.  We also discussed the importance of maintaining a healthy weight as central obesity especially increases lower extremity venous pressure.  If his symptoms progress then I think we should try thigh-high stockings.  If this is not successful I think he would be a candidate for laser ablation of the left great saphenous vein and stab phlebectomies.  The vein will be addressed in the proximal thigh where the vein is significantly dilated up to 9 mm.  At the junction of the proximal and mid thigh the vein gives off a large tributary and then become smaller.  He will call if his symptoms progress ? ?REASON FOR CONSULT:   ? ?Bilateral lower extremity varicose veins.  The consult is requested by Dr. Ihor Dow.  ? ?HPI:  ? ?James Rivas is a 51 y.o. male who was referred for varicose veins of both lower extremities.  I have reviewed the records from the referring office.  The patient was seen on  02/13/2022.  He is followed with type 2 diabetes and hypertension.  He has varicose veins of both lower extremities and was sent for vascular consultation. ? ?On my history, the patient describes some aching pain and heaviness in both legs at work where he is sitting for 12 hours shifts.  His symptoms are more significant on the left side.  Of note he had a torn meniscus repair in June 2022 and states that his varicose veins in the left leg became more prominent after that.  He denies any previous history of DVT.  He had no previous venous procedures.  He does wear knee-high compression stockings at work which he thinks have a gradient of 15 to 20 mmHg.  These were prescribed by his medical doctor. ? ?I do not get any history of claudication or rest pain. ? ?Past Medical History:  ?Diagnosis Date  ? Allergy   ? Arthritis   ? left knee  ? Chest pain   ? Chronic chest pain 06/22/2013  ? Gout   ? Gout   ? History of COVID-19 12/20/2020  ? HTN (hypertension)   ? Impaired fasting glucose   ? Morbid obesity ([place])   ? Sleep apnea   ? uses a CPAP  ? Venous stasis   ? ? ?Family History  ?Problem Relation Age of Onset  ? Diabetes Father   ? Diabetes Paternal Aunt   ? Colon cancer Neg Hx   ? ? ?  SOCIAL HISTORY: ?Social History  ? ?Tobacco Use  ? Smoking status: Never  ? Smokeless tobacco: Never  ?Substance Use Topics  ? Alcohol use: Not Currently  ?  Comment: occ. 1 beer or a glass of wine a couple times a week.   ? ? ?No Known Allergies ? ?Current Outpatient Medications  ?Medication Sig Dispense Refill  ? Accu-Chek Softclix Lancets lancets Use as instructed 100 each 12  ? allopurinol (ZYLOPRIM) 300 MG tablet TAKE 1 TABLET(300 MG) BY MOUTH DAILY 30 tablet 5  ? blood glucose meter kit and supplies Dispense based on patient and insurance preference. Use daily to check blood sugar.. (FOR ICD-10 E10.9, E11.9). 1 each 0  ? chlorthalidone (HYGROTON) 25 MG tablet Take 1 tablet (25 mg total) by mouth daily. 90 tablet 1  ? enalapril  (VASOTEC) 20 MG tablet Take 1 tablet (20 mg total) by mouth daily. 90 tablet 1  ? glucose blood test strip Use daily to check blood sugar. 100 each 12  ? metoprolol tartrate (LOPRESSOR) 25 MG tablet Take 0.5 tablets (12.5 mg total) by mouth 2 (two) times daily. TAKE 1/2 TABLET(12.5 MG) BY MOUTH TWICE DAILY 90 tablet 1  ? naproxen (NAPROSYN) 500 MG tablet TAKE 1 TABLET(500 MG) BY MOUTH TWICE DAILY WITH A MEAL 60 tablet 5  ? omeprazole (PRILOSEC) 20 MG capsule Take 1 capsule (20 mg total) by mouth daily. 30 capsule 5  ? rosuvastatin (CRESTOR) 10 MG tablet Take 1 tablet (10 mg total) by mouth daily. 90 tablet 3  ? tirzepatide (MOUNJARO) 5 MG/0.5ML Pen Inject 5 mg into the skin once a week. 2 mL 2  ? tiZANidine (ZANAFLEX) 4 MG tablet Take 1 tablet (4 mg total) by mouth every 6 (six) hours as needed for muscle spasms. 30 tablet 0  ? triamcinolone ointment (KENALOG) 0.1 % APPLY TOPICALLY TO THE AFFECTED AREA TWICE DAILY 30 g 0  ? methocarbamol (ROBAXIN) 500 MG tablet Take 1 tablet (500 mg total) by mouth 3 (three) times daily. (Patient not taking: Reported on 03/01/2022) 21 tablet 0  ? ?No current facility-administered medications for this visit.  ? ? ?REVIEW OF SYSTEMS:  ?[X] denotes positive finding, [ ] denotes negative finding ?Cardiac  Comments:  ?Chest pain or chest pressure:    ?Shortness of breath upon exertion:    ?Short of breath when lying flat:    ?Irregular heart rhythm:    ?    ?Vascular    ?Pain in calf, thigh, or hip brought on by ambulation:    ?Pain in feet at night that wakes you up from your sleep:     ?Blood clot in your veins:    ?Leg swelling:  x   ?    ?Pulmonary    ?Oxygen at home:    ?Productive cough:     ?Wheezing:     ?    ?Neurologic    ?Sudden weakness in arms or legs:     ?Sudden numbness in arms or legs:     ?Sudden onset of difficulty speaking or slurred speech:    ?Temporary loss of vision in one eye:     ?Problems with dizziness:     ?    ?Gastrointestinal    ?Blood in stool:     ?Vomited  blood:     ?    ?Genitourinary    ?Burning when urinating:     ?Blood in urine:    ?    ?Psychiatric    ?Major depression:     ?    ?  Hematologic    ?Bleeding problems:    ?Problems with blood clotting too easily:    ?    ?Skin    ?Rashes or ulcers:    ?    ?Constitutional    ?Fever or chills:    ?- ? ?PHYSICAL EXAM:  ? ?Vitals:  ? 03/01/22 1549  ?BP: 120/70  ?Pulse: 84  ?Resp: 18  ?Temp: 98.6 ?F (37 ?C)  ?TempSrc: Temporal  ?SpO2: 96%  ?Weight: (!) 367 lb (166.5 kg)  ?Height: 6' 5" (1.956 m)  ? ?Body mass index is 43.52 kg/m?. ?GENERAL: The patient is a well-nourished male, in no acute distress. The vital signs are documented above. ?CARDIAC: There is a regular rate and rhythm.  ?VASCULAR: I do not detect carotid bruits. ?He has palpable dorsalis pedis pulses bilaterally. ?He has a brisk posterior tibial and dorsalis pedis signal bilaterally with the Doppler. ?He has some large varicose veins in the left thigh and left leg as documented in the photographs below. ? ? ? ? ? ? ?He has hyperpigmentation bilaterally. ? ? ? ?I did look at his left great saphenous vein myself with the SonoSite and the vein is significantly large in the proximal thigh.  At the junction of the proximal and mid thigh it gives off some large tributaries and below that the vein becomes smaller. ? ?PULMONARY: There is good air exchange bilaterally without wheezing or rales. ?ABDOMEN: Soft and non-tender with normal pitched bowel sounds.  ?MUSCULOSKELETAL: There are no major deformities. ?NEUROLOGIC: No focal weakness or paresthesias are detected. ?SKIN: There are no ulcers or rashes noted. ?PSYCHIATRIC: The patient has a normal affect. ? ?DATA:   ? ?VENOUS DUPLEX: I have independently interpreted his venous duplex scan today.  This was of the left lower extremity only. ? ?There was no evidence of DVT. ? ?There was no deep venous reflux. ? ?There was superficial venous reflux in the left great saphenous vein from the saphenofemoral junction down  to the knee.  In the proximal thigh the vein was 9 mm in diameter.  After giving off this large tributary at the junction of the proximal and mid thigh the vein ranged in size from 3 to 4 mm. ? ? ? ? ?Gerald Stabs

## 2022-03-05 ENCOUNTER — Telehealth: Payer: Self-pay | Admitting: Orthopaedic Surgery

## 2022-03-05 ENCOUNTER — Other Ambulatory Visit: Payer: Self-pay | Admitting: Internal Medicine

## 2022-03-09 ENCOUNTER — Ambulatory Visit (HOSPITAL_COMMUNITY)
Admission: RE | Admit: 2022-03-09 | Discharge: 2022-03-09 | Disposition: A | Discharge status: Home / Self Care | Payer: BC Managed Care – PPO | Source: Ambulatory Visit | Attending: Family Medicine | Admitting: Family Medicine

## 2022-03-09 ENCOUNTER — Encounter: Payer: Self-pay | Admitting: Family Medicine

## 2022-03-09 ENCOUNTER — Ambulatory Visit: Payer: BC Managed Care – PPO | Admitting: Family Medicine

## 2022-03-09 VITALS — BP 124/84 | HR 75 | Ht 77.0 in | Wt 373.6 lb

## 2022-03-09 DIAGNOSIS — M62838 Other muscle spasm: Secondary | ICD-10-CM | POA: Diagnosis not present

## 2022-03-09 DIAGNOSIS — M4812 Ankylosing hyperostosis [Forestier], cervical region: Secondary | ICD-10-CM | POA: Diagnosis not present

## 2022-03-09 DIAGNOSIS — M2578 Osteophyte, vertebrae: Secondary | ICD-10-CM | POA: Diagnosis not present

## 2022-03-09 DIAGNOSIS — M542 Cervicalgia: Secondary | ICD-10-CM | POA: Diagnosis not present

## 2022-03-09 DIAGNOSIS — S199XXA Unspecified injury of neck, initial encounter: Secondary | ICD-10-CM | POA: Diagnosis not present

## 2022-03-09 MED ORDER — CYCLOBENZAPRINE HCL 10 MG PO TABS: 10.0000 mg | ORAL_TABLET | Freq: Three times a day (TID) | ORAL | 0 refills | Status: DC | PRN
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
## 2022-04-02 ENCOUNTER — Other Ambulatory Visit: Payer: Self-pay | Admitting: Internal Medicine

## 2022-04-02 DIAGNOSIS — I10 Essential (primary) hypertension: Secondary | ICD-10-CM

## 2022-04-13 ENCOUNTER — Other Ambulatory Visit: Payer: Self-pay | Admitting: Internal Medicine

## 2022-04-13 DIAGNOSIS — I10 Essential (primary) hypertension: Secondary | ICD-10-CM

## 2022-04-21 ENCOUNTER — Ambulatory Visit (INDEPENDENT_AMBULATORY_CARE_PROVIDER_SITE_OTHER): Payer: Self-pay | Admitting: Nurse Practitioner

## 2022-04-21 ENCOUNTER — Encounter: Payer: Self-pay | Admitting: Nurse Practitioner

## 2022-04-21 VITALS — BP 138/76 | HR 74 | Ht 77.0 in | Wt 375.0 lb

## 2022-04-21 DIAGNOSIS — M5442 Lumbago with sciatica, left side: Secondary | ICD-10-CM | POA: Insufficient documentation

## 2022-04-21 DIAGNOSIS — M545 Low back pain, unspecified: Secondary | ICD-10-CM | POA: Insufficient documentation

## 2022-04-21 MED ORDER — IBUPROFEN 600 MG PO TABS: 600.0000 mg | ORAL_TABLET | Freq: Three times a day (TID) | ORAL | 0 refills | Status: DC | PRN

## 2022-04-21 NOTE — Patient Instructions (Signed)
Please take ibuprofen 600 mg every 8 hours as needed for your back pain. Continue flexeril 10mg   three times daily as needed.    It is important that you exercise regularly at least 30 minutes 5 times a week.  Think about what you will eat, plan ahead. Choose " clean, green, fresh or frozen" over canned, processed or packaged foods which are more sugary, salty and fatty. 70 to 75% of food eaten should be vegetables and fruit. Three meals at set times with snacks allowed between meals, but they must be fruit or vegetables. Aim to eat over a 12 hour period , example 7 am to 7 pm, and STOP after  your last meal of the day. Drink water,generally about 64 ounces per day, no other drink is as healthy. Fruit juice is best enjoyed in a healthy way, by EATING the fruit.  Thanks for choosing [facility], we consider it a privelige to serve you.
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
## 2022-04-21 NOTE — Assessment & Plan Note (Addendum)
Continue Flexeril 10 mg 3 times daily as needed Start ibuprofen 600 mg 3 times daily as needed, patient told to take medication with food to prevent GI upset. Stretching exercise encouraged Continue application of heat Will refer to orthopedics if pain does not improve.

## 2022-04-26 ENCOUNTER — Other Ambulatory Visit: Payer: Self-pay | Admitting: Internal Medicine

## 2022-05-09 ENCOUNTER — Telehealth: Payer: Self-pay | Admitting: Orthopedic Surgery

## 2022-05-09 NOTE — Telephone Encounter (Signed)
Voice message received from patient this morning requesting to schedule appointment with Dr Aline Brochure. Call returned; reached voice mail, left message to return call.

## 2022-05-26 NOTE — Progress Notes (Signed)
CARDIOLOGY CONSULT NOTE       Patient ID: James Rivas MRN: [id number] DOB/AGE: May 08, 1971 50 y.o.  Admit date: (Not on file) Referring Physician: Lovena Le Primary Physician: Lindell Spar, MD Primary Cardiologist: Johnsie Cancel Reason for Consultation: PVC abnormal ECG    HPI:  51 y.o. previously seen by Dr Raliegh Ip for palpitations. Monitor with PVCls 4 beat NSVT Rx with beta blocker with improvement Normal echo September 2019 reviewed. History of HTN Rx with beta blocker , ACE And diuretic. Morbid obesity Most recent ECG 11/25/20 with SR poor R wave progression nonspecific inferior ST changes. 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 [employer] Very busy He likes to root for [place]   Activity limited by weight and gout in left knee On allopurinol Sees Dr Luna Glasgow had left meniscus repair June 2022   No chest pain Some dependant edema When I first saw May 2022 recommended calcium score to risk stratify for CAD but he deferred   Has chronic LE venous varicosities seen by [facility] Dr Doren Custard recommend compression stockings Did mention laser ablation left greater saphenous vein with stab phlebectomies   Shift hours at work last 29 years are 3pm to 3 am which is tough  ROS All other systems reviewed and negative except as noted above  Past Medical History:  Diagnosis Date   Allergy    Arthritis    left knee   Chest pain    Chronic chest pain 06/22/2013   Gout    Gout    History of COVID-19 12/20/2020   HTN (hypertension)    Impaired fasting glucose    Morbid obesity ([place])    Sleep apnea    uses a CPAP   Venous stasis     Family History  Problem Relation Age of Onset   Diabetes Father    Diabetes Paternal Aunt    Colon cancer Neg Hx     Social History   Socioeconomic History   Marital  status: Married    Spouse name: Not on file   Number of children: 3   Years of education: Not on file   Highest education level: Not on file  Occupational History   Occupation: [employer]    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 1 beer or a glass of wine a couple times a week.    Drug use: No   Sexual activity: Never  Other Topics Concern   Not on file  Social History Narrative   Not on file   Social Determinants of Health   Financial Resource Strain: Not on file  Food Insecurity: Not on file  Transportation Needs: Not on file  Physical Activity: Not on file  Stress: Not on file  Social Connections: Not on file  Intimate Partner Violence: Not on file    Past Surgical History:  Procedure Laterality Date   COLONOSCOPY WITH PROPOFOL N/A 05/11/2020   Procedure: COLONOSCOPY WITH PROPOFOL;  Surgeon: Daneil Dolin, MD;  [facility]: [facility];  Service: Endoscopy;  Laterality: N/A;  2:15pm   FINGER SURGERY Left  left ring finger 2 surgeries   I & D EXTREMITY  11/29/2011   Procedure: IRRIGATION AND DEBRIDEMENT EXTREMITY;  Surgeon: Linna Hoff;  Location: [place];  Service: Orthopedics;  Laterality: Left;  I&D Right Long and Index Fingers with Tendon Repair as Needed   KNEE ARTHROSCOPY WITH MEDIAL MENISECTOMY Left 05/17/2021   Procedure: KNEE ARTHROSCOPY WITH MICROFRACTURE;  Surgeon: Carole Civil, MD;  Location: [facility];  Service: Orthopedics;  Laterality: Left;   MENISECTOMY Left 05/17/2021   Procedure: PARTIAL MEDIAL MENISECTOMY;  Surgeon: Carole Civil, MD;  Location: [facility];  Service: Orthopedics;  Laterality: Left;      Current Outpatient Medications:    Accu-Chek Softclix Lancets lancets, Use as instructed, Disp: 100 each, Rfl: 12   allopurinol (ZYLOPRIM) 300 MG tablet, TAKE 1 TABLET(300 MG)  BY MOUTH DAILY, Disp: 30 tablet, Rfl: 5   blood glucose meter kit and supplies, Dispense based on patient and insurance preference. Use daily to check blood sugar.. (FOR ICD-10 E10.9, E11.9)., Disp: 1 each, Rfl: 0   chlorthalidone (HYGROTON) 25 MG tablet, TAKE 1 TABLET(25 MG) BY MOUTH DAILY, Disp: 90 tablet, Rfl: 1   cyclobenzaprine (FLEXERIL) 10 MG tablet, Take 1 tablet (10 mg total) by mouth 3 (three) times daily as needed for muscle spasms., Disp: 30 tablet, Rfl: 0   enalapril (VASOTEC) 20 MG tablet, TAKE 1 TABLET(20 MG) BY MOUTH DAILY, Disp: 90 tablet, Rfl: 1   glucose blood test strip, Use daily to check blood sugar., Disp: 100 each, Rfl: 12   ibuprofen (ADVIL) 600 MG tablet, Take 1 tablet (600 mg total) by mouth every 8 (eight) hours as needed., Disp: 30 tablet, Rfl: 0   methocarbamol (ROBAXIN) 500 MG tablet, Take 1 tablet (500 mg total) by mouth 3 (three) times daily., Disp: 21 tablet, Rfl: 0   metoprolol tartrate (LOPRESSOR) 25 MG tablet, TAKE 1/2 TABLET(12.5 MG) BY MOUTH TWICE DAILY, Disp: 90 tablet, Rfl: 1   MOUNJARO 5 MG/0.5ML Pen, ADMINISTER 5 MG UNDER THE SKIN 1 TIME A WEEK, Disp: 2 mL, Rfl: 2   omeprazole (PRILOSEC) 20 MG capsule, Take 1 capsule (20 mg total) by mouth daily., Disp: 30 capsule, Rfl: 5   rosuvastatin (CRESTOR) 10 MG tablet, Take 1 tablet (10 mg total) by mouth daily., Disp: 90 tablet, Rfl: 3   triamcinolone ointment (KENALOG) 0.1 %, APPLY TOPICALLY TO THE AFFECTED AREA TWICE DAILY, Disp: 30 g, Rfl: 0    Physical Exam: Blood pressure (!) 142/98, pulse 98, height 6' 5"  (1.956 m), weight (!) 376 lb 3.2 oz (170.6 kg), SpO2 95 %.    Affect appropriate Overweight black male  HEENT: normal Neck supple with no adenopathy JVP normal no bruits no thyromegaly Lungs clear with no wheezing and good diaphragmatic motion Heart:  S1/S2 no murmur, no rub, gallop or click PMI normal Abdomen: benighn, BS positve, no tenderness, no AAA no bruit.  No HSM or HJR Distal pulses  intact with no bruits Bilateral varicosities with chronic stasis (hyperpigmentation and lipo dermatosclerosis )  Neuro non-focal Skin warm and dry No muscular weakness   Labs:   Lab Results  Component Value Date   WBC 8.4 02/13/2022   HGB 14.2 02/13/2022   HCT 44.6 02/13/2022   MCV 78 (L) 02/13/2022   PLT 246 02/13/2022   No results for input(s): "NA", "K", "CL", "CO2", "BUN", "CREATININE", "CALCIUM", "PROT", "BILITOT", "ALKPHOS", "ALT", "AST", "GLUCOSE" in the last 168 hours.  Invalid input(s): "LABALBU" Lab Results  Component Value Date  TROPONINI <0.03 06/14/2018    Lab Results  Component Value Date   CHOL 119 02/13/2022   CHOL 196 06/20/2021   CHOL 181 02/11/2021   Lab Results  Component Value Date   HDL 44 02/13/2022   HDL 41 06/20/2021   HDL 38 (L) 02/11/2021   Lab Results  Component Value Date   LDLCALC 59 02/13/2022   LDLCALC 133 (H) 06/20/2021   LDLCALC 125 (H) 02/11/2021   Lab Results  Component Value Date   TRIG 80 02/13/2022   TRIG 122 06/20/2021   TRIG 97 02/11/2021   Lab Results  Component Value Date   CHOLHDL 2.7 02/13/2022   CHOLHDL 4.8 02/11/2021   CHOLHDL 4.7 08/06/2020   No results found for: "LDLDIRECT"    Radiology: No results found.  EKG: see HPI  05/31/2022 SR rate 98 PAC low voltage    ASSESSMENT AND PLAN:   1. PVC:  Benign appearing Rx with beta blocker previous echo with no structural heart disease. Discussed screening coronary calcium score to risk stratify  2. HTN:  Well controlled.  Continue current medications and low sodium Dash type diet.   3. Obesity: discussed diet, exercise possible bariatric surgery   4. Gout :  Continue allopurinol and PRN colchicine for flare  5. Venous Varicosities:  F/U [facility] compression stockings consider laser ablation of left > saphenous vein 6. DM:  Discussed low carb diet.  Target hemoglobin A1c is 6.5 or less.  Continue current medications. 7:  OSA:  CPAP and weight loss   Calcium  Score F/U in a year   Signed: Jenkins Rouge 05/31/2022, 3:00 PM

## 2022-05-31 ENCOUNTER — Ambulatory Visit (INDEPENDENT_AMBULATORY_CARE_PROVIDER_SITE_OTHER): Payer: BC Managed Care – PPO | Admitting: Cardiovascular Disease

## 2022-05-31 ENCOUNTER — Encounter: Payer: Self-pay | Admitting: Cardiovascular Disease

## 2022-05-31 VITALS — BP 142/98 | HR 98 | Ht 77.0 in | Wt 376.2 lb

## 2022-05-31 DIAGNOSIS — I493 Ventricular premature depolarization: Secondary | ICD-10-CM

## 2022-05-31 DIAGNOSIS — I10 Essential (primary) hypertension: Secondary | ICD-10-CM

## 2022-05-31 DIAGNOSIS — I8393 Asymptomatic varicose veins of bilateral lower extremities: Secondary | ICD-10-CM

## 2022-05-31 NOTE — Addendum Note (Signed)
Addended by: Kerney Elbe on: 05/31/2022 03:19 PM   Modules accepted: Orders

## 2022-05-31 NOTE — Patient Instructions (Signed)
Medication Instructions:  Your physician recommends that you continue on your current medications as directed. Please refer to the Current Medication list given to you today.  *If you need a refill on your cardiac medications before your next appointment, please call your pharmacy*   Lab Work: NONE   If you have labs (blood work) drawn today and your tests are completely normal, you will receive your results only by: MyChart Message (if you have MyChart) OR A paper copy in the mail If you have any lab test that is abnormal or we need to change your treatment, we will call you to review the results.   Testing/Procedures: Calcium Score CT    Follow-Up: At [facility], you and your health needs are our priority.  As part of our continuing mission to provide you with exceptional heart care, we have created designated Provider Care Teams.  These Care Teams include your primary Cardiologist (physician) and Advanced Practice Providers (APPs -  Physician Assistants and Nurse Practitioners) who all work together to provide you with the care you need, when you need it.  We recommend signing up for the patient portal called "MyChart".  Sign up information is provided on this After Visit Summary.  MyChart is used to connect with patients for Virtual Visits (Telemedicine).  Patients are able to view lab/test results, encounter notes, upcoming appointments, etc.  Non-urgent messages can be sent to your provider as well.   To learn more about what you can do with MyChart, go to [email or web address].    Your next appointment:   1 year(s)  The format for your next appointment:   In Person  Provider:   Charlton Haws, MD    Other Instructions Thank you for choosing [facility]!    Important Information About Sugar

## 2022-06-03 ENCOUNTER — Emergency Department (HOSPITAL_COMMUNITY)
Admission: EM | Admit: 2022-06-03 | Discharge: 2022-06-03 | Disposition: A | Discharge status: Home / Self Care | Payer: BC Managed Care – PPO | Attending: Emergency Medicine | Admitting: Emergency Medicine

## 2022-06-03 ENCOUNTER — Emergency Department (HOSPITAL_COMMUNITY): Payer: BC Managed Care – PPO

## 2022-06-03 ENCOUNTER — Other Ambulatory Visit: Payer: Self-pay

## 2022-06-03 ENCOUNTER — Encounter (HOSPITAL_COMMUNITY): Payer: Self-pay | Admitting: *Deleted

## 2022-06-03 DIAGNOSIS — K7689 Other specified diseases of liver: Secondary | ICD-10-CM | POA: Diagnosis not present

## 2022-06-03 DIAGNOSIS — I10 Essential (primary) hypertension: Secondary | ICD-10-CM | POA: Diagnosis not present

## 2022-06-03 DIAGNOSIS — K529 Noninfective gastroenteritis and colitis, unspecified: Secondary | ICD-10-CM | POA: Insufficient documentation

## 2022-06-03 DIAGNOSIS — E11649 Type 2 diabetes mellitus with hypoglycemia without coma: Secondary | ICD-10-CM | POA: Diagnosis not present

## 2022-06-03 DIAGNOSIS — K5730 Diverticulosis of large intestine without perforation or abscess without bleeding: Secondary | ICD-10-CM | POA: Diagnosis not present

## 2022-06-03 DIAGNOSIS — D72829 Elevated white blood cell count, unspecified: Secondary | ICD-10-CM | POA: Diagnosis not present

## 2022-06-03 DIAGNOSIS — Z79899 Other long term (current) drug therapy: Secondary | ICD-10-CM | POA: Diagnosis not present

## 2022-06-03 DIAGNOSIS — R109 Unspecified abdominal pain: Secondary | ICD-10-CM | POA: Diagnosis not present

## 2022-06-03 DIAGNOSIS — N281 Cyst of kidney, acquired: Secondary | ICD-10-CM | POA: Diagnosis not present

## 2022-06-03 HISTORY — DX: Type 2 diabetes mellitus without complications: E11.9

## 2022-06-03 LAB — LIPASE, BLOOD: Lipase: 34 U/L (ref 11–51)

## 2022-06-03 LAB — CBC WITH DIFFERENTIAL/PLATELET
Abs Immature Granulocytes: 0.04 10*3/uL (ref 0.00–0.07)
Basophils Absolute: 0 10*3/uL (ref 0.0–0.1)
Basophils Relative: 0 %
Eosinophils Absolute: 0.1 10*3/uL (ref 0.0–0.5)
Eosinophils Relative: 1 %
HCT: 50.1 % (ref 39.0–52.0)
Hemoglobin: 15.8 g/dL (ref 13.0–17.0)
Immature Granulocytes: 0 %
Lymphocytes Relative: 16 %
Lymphs Abs: 1.9 10*3/uL (ref 0.7–4.0)
MCH: 25.2 pg — ABNORMAL LOW (ref 26.0–34.0)
MCHC: 31.5 g/dL (ref 30.0–36.0)
MCV: 79.9 fL — ABNORMAL LOW (ref 80.0–100.0)
Monocytes Absolute: 0.7 10*3/uL (ref 0.1–1.0)
Monocytes Relative: 6 %
Neutro Abs: 9.1 10*3/uL — ABNORMAL HIGH (ref 1.7–7.7)
Neutrophils Relative %: 77 %
Platelets: 229 10*3/uL (ref 150–400)
RBC: 6.27 MIL/uL — ABNORMAL HIGH (ref 4.22–5.81)
RDW: 19 % — ABNORMAL HIGH (ref 11.5–15.5)
WBC: 11.8 10*3/uL — ABNORMAL HIGH (ref 4.0–10.5)
nRBC: 0 % (ref 0.0–0.2)

## 2022-06-03 LAB — BASIC METABOLIC PANEL
Anion gap: 10 (ref 5–15)
BUN: 19 mg/dL (ref 6–20)
CO2: 26 mmol/L (ref 22–32)
Calcium: 8.9 mg/dL (ref 8.9–10.3)
Chloride: 101 mmol/L (ref 98–111)
Creatinine, Ser: 1.09 mg/dL (ref 0.61–1.24)
GFR, Estimated: >60 mL/min (ref >60)
Glucose, Bld: 99 mg/dL (ref 70–99)
Potassium: 4.2 mmol/L (ref 3.5–5.1)
Sodium: 137 mmol/L (ref 135–145)

## 2022-06-03 LAB — CBG MONITORING, ED: Glucose-Capillary: 71 mg/dL (ref 70–99)

## 2022-06-03 MED ORDER — AMOXICILLIN-POT CLAVULANATE 875-125 MG PO TABS
1.0000 | ORAL_TABLET | Freq: Two times a day (BID) | ORAL | 0 refills | Status: DC
Start: 2022-06-03 — End: 2022-08-16

## 2022-06-03 MED ORDER — ONDANSETRON HCL 4 MG/2ML IJ SOLN
4.0000 mg | Freq: Once | INTRAMUSCULAR | Status: AC
  Administered 2022-06-03: 4 mg via INTRAVENOUS
  Filled 2022-06-03: qty 2

## 2022-06-03 MED ORDER — IOHEXOL 300 MG/ML  SOLN: 100.0000 mL | Freq: Once | INTRAMUSCULAR | Status: DC | PRN

## 2022-06-03 MED ORDER — IOHEXOL 350 MG/ML SOLN
80.0000 mL | Freq: Once | INTRAVENOUS | Status: AC | PRN
  Administered 2022-06-03: 80 mL via INTRAVENOUS

## 2022-06-03 MED ORDER — FENTANYL CITRATE PF 50 MCG/ML IJ SOSY
50.0000 ug | PREFILLED_SYRINGE | Freq: Once | INTRAMUSCULAR | Status: AC
  Administered 2022-06-03: 50 ug via INTRAVENOUS
  Filled 2022-06-03: qty 1

## 2022-06-03 MED ORDER — HYDROCODONE-ACETAMINOPHEN 5-325 MG PO TABS: 1.0000 | ORAL_TABLET | Freq: Four times a day (QID) | ORAL | 0 refills | Status: DC | PRN

## 2022-06-03 MED ORDER — ONDANSETRON 4 MG PO TBDP: 4.0000 mg | ORAL_TABLET | Freq: Three times a day (TID) | ORAL | 0 refills | Status: DC | PRN

## 2022-06-03 MED ORDER — AMOXICILLIN-POT CLAVULANATE 875-125 MG PO TABS
1.0000 | ORAL_TABLET | Freq: Once | ORAL | Status: AC
  Administered 2022-06-03: 1 via ORAL
  Filled 2022-06-03: qty 1

## 2022-06-03 MED ORDER — SODIUM CHLORIDE 0.9 % IV BOLUS
1000.0000 mL | Freq: Once | INTRAVENOUS | Status: AC
  Administered 2022-06-03: 1000 mL via INTRAVENOUS

## 2022-06-03 NOTE — ED Provider Notes (Signed)
[facility] EMERGENCY DEPARTMENT Provider Note   CSN: [id number] Arrival date & time: 06/03/22  1811     History  Chief Complaint  Patient presents with   Hypoglycemia    James Rivas is a 51 y.o. male.  HPI Patient with a history of insulin-dependent diabetes, morbid obesity, hypertension presents with abdominal pain, nausea, vomiting, hypoglycemia.  He began feeling symptoms yesterday, despite of abdominal pain, nausea he took his weekly Mounjaro administration earlier today.  Since that time he has not been eating and drinking, and his blood sugar was in the 90s, then 70s, prompting ED transport.  Abdominal pain is suprapubic, nonradiating with associated nausea, vomiting, anorexia, no change in bowel movements, no urinary symptoms.    Home Medications Prior to Admission medications   Medication Sig Start Date End Date Taking? Authorizing Provider  Accu-Chek Softclix Lancets lancets Use as instructed 01/05/22   Lindell Spar, MD  allopurinol (ZYLOPRIM) 300 MG tablet TAKE 1 TABLET(300 MG) BY MOUTH DAILY 03/06/22   Sanjuana Kava, MD  blood glucose meter kit and supplies Dispense based on patient and insurance preference. Use daily to check blood sugar.. (FOR ICD-10 E10.9, E11.9). 06/23/21   Noreene Larsson, NP  chlorthalidone (HYGROTON) 25 MG tablet TAKE 1 TABLET(25 MG) BY MOUTH DAILY 04/03/22   Lindell Spar, MD  cyclobenzaprine (FLEXERIL) 10 MG tablet Take 1 tablet (10 mg total) by mouth 3 (three) times daily as needed for muscle spasms. 03/09/22   Alvira Monday, FNP  enalapril (VASOTEC) 20 MG tablet TAKE 1 TABLET(20 MG) BY MOUTH DAILY 04/13/22   Lindell Spar, MD  glucose blood test strip Use daily to check blood sugar. 09/19/21   Lindell Spar, MD  ibuprofen (ADVIL) 600 MG tablet Take 1 tablet (600 mg total) by mouth every 8 (eight) hours as needed. 04/21/22   Paseda, Dewaine Conger, FNP  methocarbamol (ROBAXIN) 500 MG tablet Take 1 tablet (500 mg total) by mouth 3 (three) times  daily. 05/18/21   Triplett, Tammy, PA-C  metoprolol tartrate (LOPRESSOR) 25 MG tablet TAKE 1/2 TABLET(12.5 MG) BY MOUTH TWICE DAILY 03/06/22   Fayrene Helper, MD  [facility] 5 MG/0.5ML Pen ADMINISTER 5 MG UNDER THE SKIN 1 TIME A WEEK 04/26/22   Lindell Spar, MD  omeprazole (PRILOSEC) 20 MG capsule Take 1 capsule (20 mg total) by mouth daily. 02/13/22   Lindell Spar, MD  rosuvastatin (CRESTOR) 10 MG tablet Take 1 tablet (10 mg total) by mouth daily. 02/15/22   Lindell Spar, MD  triamcinolone ointment (KENALOG) 0.1 % APPLY TOPICALLY TO THE AFFECTED AREA TWICE DAILY 01/30/22   Fayrene Helper, MD      Allergies    Patient has no known allergies.    Review of Systems   Review of Systems  All other systems reviewed and are negative.   Physical Exam Updated Vital Signs BP 126/80   Pulse 76   Temp 97.7 F (36.5 C) (Oral)   Resp 15   Ht 6' 5"  (1.956 m)   Wt (!) 163.3 kg   SpO2 97%   BMI 42.69 kg/m  Physical Exam Vitals and nursing note reviewed.  Constitutional:      General: He is not in acute distress.    Appearance: He is well-developed. He is obese. He is not toxic-appearing or diaphoretic.  HENT:     Head: Normocephalic and atraumatic.  Eyes:     Conjunctiva/sclera: Conjunctivae normal.  Cardiovascular:  Rate and Rhythm: Normal rate and regular rhythm.  Pulmonary:     Effort: Pulmonary effort is normal. No respiratory distress.     Breath sounds: No stridor.  Abdominal:     General: There is no distension.     Tenderness: There is abdominal tenderness. There is guarding.  Skin:    General: Skin is warm and dry.  Neurological:     Mental Status: He is alert and oriented to person, place, and time.     ED Results / Procedures / Treatments   Labs (all labs ordered are listed, but only abnormal results are displayed) Labs Reviewed  CBC WITH DIFFERENTIAL/PLATELET - Abnormal; Notable for the following components:      Result Value   WBC 11.8 (*)    RBC  6.27 (*)    MCV 79.9 (*)    MCH 25.2 (*)    RDW 19.0 (*)    Neutro Abs 9.1 (*)    All other components within normal limits  BASIC METABOLIC PANEL  LIPASE, BLOOD  CBG MONITORING, ED    EKG None  Radiology CT Abdomen Pelvis W Contrast  Result Date: 06/03/2022 CLINICAL DATA:  Abdominal pain, nausea and vomiting.  Constipation. EXAM: CT ABDOMEN AND PELVIS WITH CONTRAST TECHNIQUE: Multidetector CT imaging of the abdomen and pelvis was performed using the standard protocol following bolus administration of intravenous contrast. RADIATION DOSE REDUCTION: This exam was performed according to the departmental dose-optimization program which includes automated exposure control, adjustment of the mA and/or kV according to patient size and/or use of iterative reconstruction technique. CONTRAST:  <See Chart> OMNIPAQUE IOHEXOL 300 MG/ML SOLN, 42m OMNIPAQUE IOHEXOL 350 MG/ML SOLN COMPARISON:  11/19/2020. FINDINGS: Lower chest: Mild atelectasis is noted at the lung bases. The heart is mildly enlarged. Hepatobiliary: A cyst is present in the left lobe of the liver measuring 1.3 cm. No biliary ductal dilatation. The gallbladder is without stones. Pancreas: Unremarkable. No pancreatic ductal dilatation or surrounding inflammatory changes. Spleen: Normal in size without focal abnormality. Adrenals/Urinary Tract: The adrenal glands are within normal limits. The kidneys enhance symmetrically. A cyst is present in the lower pole the left kidney. No renal or ureteral calculus or hydronephrosis. The bladder is unremarkable. Stomach/Bowel: The stomach is distended with fluid. No bowel obstruction, free air, or pneumatosis. Scattered diverticula are present along the colon without evidence of diverticulitis. A normal appendix is present in the right lower quadrant. There are multiple loops of small bowel in the mid abdomen with bowel wall thickening and surrounding inflammatory changes. Vascular/Lymphatic: No significant  vascular findings. No enlarged abdominal or pelvic lymph nodes by size criteria. Reproductive: Prostate is unremarkable. Other: A small amount of free fluid is noted in the upper quadrants bilaterally and pelvis. Fat containing inguinal hernias are present bilaterally. Musculoskeletal: Degenerative changes are present in the thoracolumbar spine and bilateral hips. No acute osseous abnormality. IMPRESSION: 1. Multiple loops of small bowel in the abdomen with bowel wall thickening and surrounding inflammatory changes suggesting enteritis. No bowel obstruction or free air. 2. Trace amount of free fluid in the upper abdomen bilaterally and pelvis. 3. Left renal cyst. 4. Hepatic cyst. 5. Diverticulosis without diverticulitis. Electronically Signed   By: LBrett FairyM.D.   On: 06/03/2022 22:19    Procedures Procedures    Medications Ordered in ED Medications  amoxicillin-clavulanate (AUGMENTIN) 875-125 MG per tablet 1 tablet (has no administration in time range)  sodium chloride 0.9 % bolus 1,000 mL (0 mLs Intravenous Stopped 06/03/22 2223)  fentaNYL (SUBLIMAZE) injection 50 mcg (50 mcg Intravenous Given 06/03/22 2105)  ondansetron (ZOFRAN) injection 4 mg (4 mg Intravenous Given 06/03/22 2102)  iohexol (OMNIPAQUE) 350 MG/ML injection 80 mL (80 mLs Intravenous Contrast Given 06/03/22 2151)    ED Course/ Medical Decision Making/ A&P This patient with a Hx of morbid obesity, hypertension, diabetes presents to the ED for concern of abdominal pain, nausea, vomiting, hyperglycemia, this involves an extensive number of treatment options, and is a complaint that carries with it a high risk of complications and morbidity.    The differential diagnosis includes infection, bacteremia, sepsis, obstruction   Social Determinants of Health:  Obesity, multiple medical problems  Additional history obtained:  Additional history and/or information obtained from wife, notable for HPI details included above   After the  initial evaluation, orders, including: CT, fluids, IV narcotics, antiemetics were initiated.   Patient placed on Cardiac and Pulse-Oximetry Monitors. The patient was maintained on a cardiac monitor.  The cardiac monitored showed an rhythm of 80 sinus normal The patient was also maintained on pulse oximetry. The readings were typically 100% room air normal   On repeat evaluation of the patient improved  Lab Tests:  I personally interpreted labs.  The pertinent results include: Leukocytosis  Imaging Studies ordered:  I independently visualized and interpreted imaging which showed enteritis I agree with the radiologist interpretation 11:19 PM Patient feeling better, awake, alert, in no distress Dispostion / Final MDM:  After consideration of the diagnostic results and the patient's response to treatment, this adult male with multiple medical issues including obesity, diabetes, hypertension presents with abdominal pain, nausea, vomiting.  Patient found to have enteritis, but no evidence for bacteremia, sepsis with no hypotension, tachycardia.  Patient improved substantially with fluids, analgesics, antiemetics, was appropriate for oral antibiotics, outpatient follow-up.  Final Clinical Impression(s) / ED Diagnoses Final diagnoses:  Rober Minion     Carmin Muskrat, MD 06/03/22 2319

## 2022-06-03 NOTE — ED Triage Notes (Signed)
Pt with c/o hypoglycemia, cbg 75 after drinking a regular ginger ale.  Pt with abd pain and emesis since last night. LBM 2 days ago.

## 2022-06-03 NOTE — Discharge Instructions (Addendum)
As discussed, you have been diagnosed with enteritis.  This is an inflammation, or infection of your small intestine.  Please take all medication as directed and follow-up with your physician.  Return here for concerning changes in your condition.

## 2022-06-05 ENCOUNTER — Encounter: Payer: Self-pay | Admitting: Orthopedic Surgery

## 2022-06-05 ENCOUNTER — Ambulatory Visit (INDEPENDENT_AMBULATORY_CARE_PROVIDER_SITE_OTHER): Payer: BC Managed Care – PPO

## 2022-06-05 ENCOUNTER — Ambulatory Visit: Payer: BC Managed Care – PPO | Admitting: Orthopedic Surgery

## 2022-06-05 DIAGNOSIS — M1712 Unilateral primary osteoarthritis, left knee: Secondary | ICD-10-CM

## 2022-06-05 DIAGNOSIS — M25562 Pain in left knee: Secondary | ICD-10-CM

## 2022-06-05 DIAGNOSIS — G8929 Other chronic pain: Secondary | ICD-10-CM

## 2022-06-05 MED ORDER — PREDNISONE 10 MG PO TABS: 10.0000 mg | ORAL_TABLET | Freq: Three times a day (TID) | ORAL | 0 refills | Status: DC

## 2022-06-05 NOTE — Patient Instructions (Signed)
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

## 2022-06-05 NOTE — Progress Notes (Signed)
FOLLOW UP   Encounter Diagnosis  Name Primary?   Chronic pain of left knee Yes     Chief Complaint  Patient presents with   Knee Pain    Left, pain and swelling    May 17, 2021 1 year ago patient had arthroscopy left knee with microfracture and partial medial meniscectomy  Operative findings   MEDIAL chondral defect medial femoral condyle 10 x 7 full-thickness defect, medial meniscus tear radial tear posterior horn  He comes in today complaining of increased pain and swelling somewhat related to his job requiring him to go back to his prior tasks and pulling him from his current job  His exam shows a knee effusion with tenderness medially and laterally somewhat diffuse around the suprapatellar pouch as well this affects his range of motion by limiting his flexion is extension seems to be intact  Images were repeated  Advanced arthrosis medial compartment grade 3 disease  Injection left knee  Patient candidate for Orthovisc injections or hyaluronic acid injections  We will call him when we get the approval  Procedure note left knee injection   verbal consent was obtained to inject left knee joint  Timeout was completed to confirm the site of injection  The medications used were depomedrol 40 mg and 1% lidocaine 3 cc Anesthesia was provided by ethyl chloride and the skin was prepped with alcohol.  After cleaning the skin with alcohol a 20-gauge needle was used to inject the left knee joint. There were no complications. A sterile bandage was applied.    Meds ordered this encounter  Medications   predniSONE (DELTASONE) 10 MG tablet    Sig: Take 1 tablet (10 mg total) by mouth 3 (three) times daily.    Dispense:  42 tablet    Refill:  0    We added some medication to help with swelling

## 2022-06-05 NOTE — Addendum Note (Signed)
Addended by: Debby Bud R on: 06/05/2022 05:00 PM   Modules accepted: Orders

## 2022-06-07 ENCOUNTER — Encounter: Payer: Self-pay | Admitting: Internal Medicine

## 2022-06-07 ENCOUNTER — Ambulatory Visit (INDEPENDENT_AMBULATORY_CARE_PROVIDER_SITE_OTHER): Payer: BC Managed Care – PPO | Admitting: Internal Medicine

## 2022-06-07 VITALS — BP 132/82 | HR 88 | Resp 18 | Ht 77.0 in | Wt 365.4 lb

## 2022-06-07 DIAGNOSIS — K529 Noninfective gastroenteritis and colitis, unspecified: Secondary | ICD-10-CM

## 2022-06-07 DIAGNOSIS — Z09 Encounter for follow-up examination after completed treatment for conditions other than malignant neoplasm: Secondary | ICD-10-CM | POA: Diagnosis not present

## 2022-06-07 NOTE — Progress Notes (Signed)
Acute Office Visit  Subjective:    Patient ID: James Rivas, male    DOB: 1971-10-20, 51 y.o.   MRN: [id number]  Chief Complaint  Patient presents with   Follow-up    Follow up patient was seen in ER 06-03-22 for small intestine infection he is getting better was supposed to be seen by GI didn't know if we would refer to GI for him     HPI Patient is in today for follow-up after recent ER visit for abdominal pain, nausea and vomiting.  CT abdomen showed enteritis, for which he has been placed on Augmentin.  He denies any abdominal pain or cramping, nausea, vomiting, loose BM or diarrhea, fever or chills currently.  He does report having salad before his episode of abdominal pain.  Past Medical History:  Diagnosis Date   Allergy    Arthritis    left knee   Chest pain    Chronic chest pain 06/22/2013   Diabetes mellitus without complication ([place])    Gout    Gout    History of COVID-19 12/20/2020   HTN (hypertension)    Impaired fasting glucose    Morbid obesity (HCC)    Sleep apnea    uses a CPAP   Venous stasis     Past Surgical History:  Procedure Laterality Date   COLONOSCOPY WITH PROPOFOL N/A 05/11/2020   Procedure: COLONOSCOPY WITH PROPOFOL;  Surgeon: Daneil Dolin, MD;  [facility];  Service: Endoscopy;  Laterality: N/A;  2:15pm   FINGER SURGERY Left    left ring finger 2 surgeries   I & D EXTREMITY  11/29/2011   Procedure: IRRIGATION AND DEBRIDEMENT EXTREMITY;  Surgeon: Linna Hoff;  Location: [place];  Service: Orthopedics;  Laterality: Left;  I&D Right Long and Index Fingers with Tendon Repair as Needed   KNEE ARTHROSCOPY WITH MEDIAL MENISECTOMY Left 05/17/2021   Procedure: KNEE ARTHROSCOPY WITH MICROFRACTURE;  Surgeon: Carole Civil, MD;  Location: [facility];  Service: Orthopedics;  Laterality: Left;   MENISECTOMY Left 05/17/2021   Procedure: PARTIAL MEDIAL MENISECTOMY;  Surgeon: Carole Civil, MD;  Location: [facility];  Service: Orthopedics;   Laterality: Left;    Family History  Problem Relation Age of Onset   Diabetes Father    Diabetes Paternal Aunt    Colon cancer Neg Hx     Social History   Socioeconomic History   Marital status: Married    Spouse name: Not on file   Number of children: 3   Years of education: Not on file   Highest education level: Not on file  Occupational History   Occupation: [employer]    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 1 beer or a glass of wine a couple times a week.    Drug use: No   Sexual activity: Never  Other Topics Concern   Not on file  Social History Narrative   Not on file   Social Determinants of Health   Financial Resource Strain: Not on file  Food Insecurity: Not on file  Transportation Needs: Not on file  Physical Activity: Not on file  Stress: Not on file  Social Connections: Not on file  Intimate Partner Violence: Not on file    Outpatient Medications Prior to  Visit  Medication Sig Dispense Refill   Accu-Chek Softclix Lancets lancets Use as instructed 100 each 12   allopurinol (ZYLOPRIM) 300 MG tablet TAKE 1 TABLET(300 MG) BY MOUTH DAILY 30 tablet 5   amoxicillin-clavulanate (AUGMENTIN) 875-125 MG tablet Take 1 tablet by mouth every 12 (twelve) hours. 14 tablet 0   blood glucose meter kit and supplies Dispense based on patient and insurance preference. Use daily to check blood sugar.. (FOR ICD-10 E10.9, E11.9). 1 each 0   chlorthalidone (HYGROTON) 25 MG tablet TAKE 1 TABLET(25 MG) BY MOUTH DAILY 90 tablet 1   cyclobenzaprine (FLEXERIL) 10 MG tablet Take 1 tablet (10 mg total) by mouth 3 (three) times daily as needed for muscle spasms. 30 tablet 0   enalapril (VASOTEC) 20 MG tablet TAKE 1 TABLET(20 MG) BY MOUTH DAILY 90 tablet 1   glucose blood test strip Use daily to check blood sugar.  100 each 12   HYDROcodone-acetaminophen (NORCO/VICODIN) 5-325 MG tablet Take 1 tablet by mouth every 6 (six) hours as needed for severe pain. 10 tablet 0   ibuprofen (ADVIL) 600 MG tablet Take 1 tablet (600 mg total) by mouth every 8 (eight) hours as needed. 30 tablet 0   methocarbamol (ROBAXIN) 500 MG tablet Take 1 tablet (500 mg total) by mouth 3 (three) times daily. 21 tablet 0   metoprolol tartrate (LOPRESSOR) 25 MG tablet TAKE 1/2 TABLET(12.5 MG) BY MOUTH TWICE DAILY 90 tablet 1   MOUNJARO 5 MG/0.5ML Pen ADMINISTER 5 MG UNDER THE SKIN 1 TIME A WEEK 2 mL 2   omeprazole (PRILOSEC) 20 MG capsule Take 1 capsule (20 mg total) by mouth daily. 30 capsule 5   ondansetron (ZOFRAN-ODT) 4 MG disintegrating tablet Take 1 tablet (4 mg total) by mouth every 8 (eight) hours as needed for nausea or vomiting. 20 tablet 0   predniSONE (DELTASONE) 10 MG tablet Take 1 tablet (10 mg total) by mouth 3 (three) times daily. 42 tablet 0   rosuvastatin (CRESTOR) 10 MG tablet Take 1 tablet (10 mg total) by mouth daily. 90 tablet 3   triamcinolone ointment (KENALOG) 0.1 % APPLY TOPICALLY TO THE AFFECTED AREA TWICE DAILY 30 g 0   No facility-administered medications prior to visit.    No Known Allergies  Review of Systems  Constitutional:  Negative for chills and fever.  HENT:  Negative for congestion and sore throat.   Eyes:  Negative for pain and discharge.  Respiratory:  Negative for cough and shortness of breath.   Cardiovascular:  Negative for chest pain and palpitations.  Gastrointestinal:  Negative for constipation, diarrhea, nausea and vomiting.  Endocrine: Negative for polydipsia and polyuria.  Genitourinary:  Negative for dysuria and hematuria.  Musculoskeletal:  Positive for arthralgias. Negative for neck pain and neck stiffness.  Skin:  Negative for rash.  Neurological:  Negative for dizziness, weakness, numbness and headaches.  Psychiatric/Behavioral:  Negative for agitation and behavioral  problems.        Objective:    Physical Exam Vitals reviewed.  Constitutional:      General: He is not in acute distress.    Appearance: He is obese. He is not diaphoretic.  HENT:     Head: Normocephalic and atraumatic.     Nose: Nose normal.     Mouth/Throat:     Mouth: Mucous membranes are moist.  Eyes:     General: No scleral icterus.    Extraocular Movements: Extraocular movements intact.  Cardiovascular:     Rate and Rhythm:  Normal rate and regular rhythm.     Pulses: Normal pulses.     Heart sounds: Normal heart sounds. No murmur heard. Pulmonary:     Breath sounds: Normal breath sounds. No wheezing or rales.  Abdominal:     Palpations: Abdomen is soft.     Tenderness: There is no abdominal tenderness. There is no guarding or rebound.  Musculoskeletal:     Cervical back: Neck supple. No tenderness.     Right lower leg: No edema.     Left lower leg: No edema.  Skin:    General: Skin is warm.     Findings: Rash (stasis dermatitis b/l) present.  Neurological:     General: No focal deficit present.     Mental Status: He is alert and oriented to person, place, and time.     Cranial Nerves: No cranial nerve deficit.     Sensory: No sensory deficit.     Motor: No weakness.  Psychiatric:        Mood and Affect: Mood normal.        Behavior: Behavior normal.     BP 132/82 (BP Location: Right Arm, Patient Position: Sitting, Cuff Size: Normal)   Pulse 88   Resp 18   Ht 6' 5"  (1.956 m)   Wt (!) 365 lb 6.4 oz (165.7 kg)   SpO2 95%   BMI 43.33 kg/m  Wt Readings from Last 3 Encounters:  06/07/22 (!) 365 lb 6.4 oz (165.7 kg)  06/03/22 (!) 360 lb (163.3 kg)  05/31/22 (!) 376 lb 3.2 oz (170.6 kg)        Assessment & Plan:   Problem List Items Addressed This Visit    Visit Diagnoses     Enteritis    -  Primary   Encounter for examination following treatment at hospital    GI symptoms improved now On Augmentin for enteritis ER chart reviewed including  imaging and blood tests Advised to maintain adequate hydration        No orders of the defined types were placed in this encounter.    Lindell Spar, MD

## 2022-06-07 NOTE — Patient Instructions (Signed)
Please continue taking medications as prescribed.  Please continue to maintain at least 64 ounces of fluid in a day and eat at regular intervals. 

## 2022-06-08 ENCOUNTER — Ambulatory Visit: Payer: BC Managed Care – PPO | Admitting: Internal Medicine

## 2022-06-08 ENCOUNTER — Ambulatory Visit: Payer: BC Managed Care – PPO | Admitting: Family Medicine

## 2022-06-19 ENCOUNTER — Other Ambulatory Visit (HOSPITAL_COMMUNITY): Payer: BC Managed Care – PPO

## 2022-06-29 ENCOUNTER — Other Ambulatory Visit (HOSPITAL_COMMUNITY): Payer: BC Managed Care – PPO

## 2022-07-02 ENCOUNTER — Other Ambulatory Visit: Payer: Self-pay | Admitting: Internal Medicine

## 2022-07-02 DIAGNOSIS — E782 Mixed hyperlipidemia: Secondary | ICD-10-CM

## 2022-07-06 ENCOUNTER — Other Ambulatory Visit (HOSPITAL_COMMUNITY): Payer: BC Managed Care – PPO

## 2022-07-06 ENCOUNTER — Ambulatory Visit (HOSPITAL_COMMUNITY)
Admission: RE | Admit: 2022-07-06 | Discharge: 2022-07-06 | Disposition: A | Discharge status: Home / Self Care | Payer: BC Managed Care – PPO | Source: Ambulatory Visit | Attending: Cardiovascular Disease | Admitting: Cardiovascular Disease

## 2022-07-06 DIAGNOSIS — I10 Essential (primary) hypertension: Secondary | ICD-10-CM | POA: Insufficient documentation

## 2022-07-06 DIAGNOSIS — I8393 Asymptomatic varicose veins of bilateral lower extremities: Secondary | ICD-10-CM | POA: Insufficient documentation

## 2022-07-06 DIAGNOSIS — I493 Ventricular premature depolarization: Secondary | ICD-10-CM | POA: Insufficient documentation

## 2022-07-13 ENCOUNTER — Other Ambulatory Visit: Payer: Self-pay

## 2022-07-13 DIAGNOSIS — I2721 Secondary pulmonary arterial hypertension: Secondary | ICD-10-CM

## 2022-07-14 ENCOUNTER — Other Ambulatory Visit: Payer: Self-pay | Admitting: Internal Medicine

## 2022-07-14 ENCOUNTER — Telehealth: Payer: Self-pay | Admitting: Cardiovascular Disease

## 2022-07-14 DIAGNOSIS — I10 Essential (primary) hypertension: Secondary | ICD-10-CM

## 2022-07-14 DIAGNOSIS — I2721 Secondary pulmonary arterial hypertension: Secondary | ICD-10-CM

## 2022-07-14 NOTE — Telephone Encounter (Signed)
James Stade, MD  07/10/2022  2:11 PM EDT     Calcium score 0 great not at risk for MI Dilated PA likely some pulmonary hypertension from OSA/obesity  F/U echo to assess PA pressures and RV

## 2022-07-14 NOTE — Telephone Encounter (Signed)
Results discussed with patient,echo ordered, pt prefers [facility]   Copied pcp   Message sent to scheduling

## 2022-07-14 NOTE — Telephone Encounter (Signed)
-----   Message from Ethelda Chick, RN sent at 07/12/2022  5:52 PM EDT -----  ----- Message ----- From: Wendall Stade, MD Sent: 07/10/2022   2:11 PM EDT To: Ethelda Chick, RN  Calcium score 0 great not at risk for MI Dilated PA likely some pulmonary hypertension from OSA/obesity  F/U echo to assess PA pressures and RV

## 2022-07-14 NOTE — Telephone Encounter (Signed)
Pt returning call for ct results  

## 2022-07-15 ENCOUNTER — Other Ambulatory Visit: Payer: Self-pay | Admitting: Internal Medicine

## 2022-07-15 DIAGNOSIS — E1169 Type 2 diabetes mellitus with other specified complication: Secondary | ICD-10-CM

## 2022-07-18 ENCOUNTER — Ambulatory Visit (HOSPITAL_COMMUNITY)
Admission: RE | Admit: 2022-07-18 | Discharge: 2022-07-18 | Disposition: A | Discharge status: Home / Self Care | Payer: BC Managed Care – PPO | Source: Ambulatory Visit | Attending: Cardiovascular Disease | Admitting: Cardiovascular Disease

## 2022-07-18 DIAGNOSIS — I2721 Secondary pulmonary arterial hypertension: Secondary | ICD-10-CM | POA: Diagnosis not present

## 2022-07-18 LAB — ECHOCARDIOGRAM COMPLETE
Area-P 1/2: 2.8 cm2
S' Lateral: 3.6 cm

## 2022-07-18 MED ORDER — PERFLUTREN LIPID MICROSPHERE
1.0000 mL | INTRAVENOUS | Status: AC | PRN
  Administered 2022-07-18: 5 mL via INTRAVENOUS

## 2022-07-18 NOTE — Progress Notes (Signed)
*  PRELIMINARY RESULTS* Echocardiogram 2D Echocardiogram has been performed with Definity.  Stacey Drain 07/18/2022, 3:12 PM

## 2022-07-21 ENCOUNTER — Telehealth: Payer: Self-pay

## 2022-07-21 NOTE — Telephone Encounter (Signed)
-----   Message from Wendall Stade, MD sent at 07/19/2022  7:29 AM EDT ----- EF is normal great valves ok

## 2022-07-21 NOTE — Telephone Encounter (Signed)
Patient notified and verbalized understanding. Patient had no questions or concerns at this time.  

## 2022-08-07 ENCOUNTER — Encounter: Payer: Self-pay | Admitting: Podiatry

## 2022-08-07 ENCOUNTER — Telehealth: Payer: Self-pay | Admitting: Internal Medicine

## 2022-08-07 NOTE — Telephone Encounter (Signed)
Physical form (work)   Research scientist (life sciences) in provider box Copy in brown folder

## 2022-08-10 NOTE — Telephone Encounter (Signed)
Called patient , faxed the form and patient will pick up next week a copy

## 2022-08-16 ENCOUNTER — Ambulatory Visit (INDEPENDENT_AMBULATORY_CARE_PROVIDER_SITE_OTHER): Payer: BC Managed Care – PPO | Admitting: Internal Medicine

## 2022-08-16 ENCOUNTER — Encounter: Payer: Self-pay | Admitting: Internal Medicine

## 2022-08-16 DIAGNOSIS — Z23 Encounter for immunization: Secondary | ICD-10-CM | POA: Diagnosis not present

## 2022-08-16 DIAGNOSIS — E1169 Type 2 diabetes mellitus with other specified complication: Secondary | ICD-10-CM

## 2022-08-16 DIAGNOSIS — I10 Essential (primary) hypertension: Secondary | ICD-10-CM

## 2022-08-16 DIAGNOSIS — E782 Mixed hyperlipidemia: Secondary | ICD-10-CM | POA: Diagnosis not present

## 2022-08-16 DIAGNOSIS — M79641 Pain in right hand: Secondary | ICD-10-CM

## 2022-08-16 DIAGNOSIS — M79642 Pain in left hand: Secondary | ICD-10-CM

## 2022-08-16 MED ORDER — MOUNJARO 5 MG/0.5ML ~~LOC~~ SOAJ: 5.0000 mg | SUBCUTANEOUS | 5 refills | Status: DC

## 2022-08-16 NOTE — Assessment & Plan Note (Signed)
BP Readings from Last 1 Encounters:  08/16/22 113/71   Well-controlled with Enalapril, Chlorthalidone and Metoprolol Counseled for compliance with the medications Advised DASH diet and moderate exercise/walking, at least 150 mins/week

## 2022-08-16 NOTE — Assessment & Plan Note (Signed)
BMI Readings from Last 2 Encounters:  08/16/22 43.50 kg/m  06/07/22 43.33 kg/m   Diet modification and moderate exercise advised Continue Mounjaro for DM and obesity

## 2022-08-16 NOTE — Assessment & Plan Note (Addendum)
On Crestor Last lipid profile reviewed

## 2022-08-16 NOTE — Progress Notes (Signed)
Established Patient Office Visit  Subjective:  Patient ID: James Rivas, male    DOB: 1971-08-28  Age: 51 y.o. MRN: [id number]  CC:  Chief Complaint  Patient presents with   Follow-up    Follow up    HPI James Rivas is a 51 y.o. male with past medical history of HTN, type II DM with morbid obesity, OSA, gout and HLD who presents for f/u of his chronic medical conditions.  Type II DM: He is tolerating Mounjaro well.  He agrees to follow small, frequent meals to avoid bloating/gas sensation.  He denies any polyuria or polyphagia currently. He is trying to follow low carb diet to lose weight.   HTN: BP is well-controlled. Takes medications regularly. Patient denies headache, dizziness, chest pain, dyspnea or palpitations.  He complains of chronic, intermittent pain and swelling of the MCP joints of the hands and morning stiffness at times.  He reports remote history of injury on left hand.  Denies any numbness or tingling of the UE.  He also has a history of gout, for which he takes allopurinol.  He received second dose of Shingrix vaccine in the office today.  Past Medical History:  Diagnosis Date   Allergy    Arthritis    left knee   Chest pain    Chronic chest pain 06/22/2013   Diabetes mellitus without complication ([place])    Gout    Gout    History of COVID-19 12/20/2020   HTN (hypertension)    Impaired fasting glucose    Morbid obesity (HCC)    Sleep apnea    uses a CPAP   Venous stasis     Past Surgical History:  Procedure Laterality Date   COLONOSCOPY WITH PROPOFOL N/A 05/11/2020   Procedure: COLONOSCOPY WITH PROPOFOL;  Surgeon: Daneil Dolin, MD;  [facility];  Service: Endoscopy;  Laterality: N/A;  2:15pm   FINGER SURGERY Left    left ring finger 2 surgeries   I & D EXTREMITY  11/29/2011   Procedure: IRRIGATION AND DEBRIDEMENT EXTREMITY;  Surgeon: Linna Hoff;  Location: [place];  Service: Orthopedics;  Laterality: Left;  I&D Right Long and Index  Fingers with Tendon Repair as Needed   KNEE ARTHROSCOPY WITH MEDIAL MENISECTOMY Left 05/17/2021   Procedure: KNEE ARTHROSCOPY WITH MICROFRACTURE;  Surgeon: Carole Civil, MD;  Location[facility];  Service: Orthopedics;  Laterality: Left;   MENISECTOMY Left 05/17/2021   Procedure: PARTIAL MEDIAL MENISECTOMY;  Surgeon: Carole Civil, MD;  Location: [facility];  Service: Orthopedics;  Laterality: Left;    Family History  Problem Relation Age of Onset   Diabetes Father    Diabetes Paternal Aunt    Colon cancer Neg Hx     Social History   Socioeconomic History   Marital status: Married    Spouse name: Not on file   Number of children: 3   Years of education: Not on file   Highest education level: Not on file  Occupational History   Occupation: [employer]    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 1 beer or a glass of wine a couple times a week.    Drug use: No   Sexual activity: Never  Other Topics Concern   Not on file  Social History Narrative   Not on file   Social Determinants of Health   Financial Resource Strain: Not on file  Food Insecurity: Not on file  Transportation Needs: Not on file  Physical Activity: Not on file  Stress: Not on file  Social Connections: Not on file  Intimate Partner Violence: Not on file    Outpatient Medications Prior to Visit  Medication Sig Dispense Refill   Accu-Chek Softclix Lancets lancets Use as instructed 100 each 12   allopurinol (ZYLOPRIM) 300 MG tablet TAKE 1 TABLET(300 MG) BY MOUTH DAILY 30 tablet 5   blood glucose meter kit and supplies Dispense based on patient and insurance preference. Use daily to check blood sugar.. (FOR ICD-10 E10.9, E11.9). 1 each 0   chlorthalidone (HYGROTON) 25 MG tablet TAKE 1 TABLET(25 MG) BY MOUTH DAILY 90 tablet 1   enalapril  (VASOTEC) 20 MG tablet TAKE 1 TABLET(20 MG) BY MOUTH DAILY 90 tablet 1   glucose blood test strip Use daily to check blood sugar. 100 each 12   ibuprofen (ADVIL) 600 MG tablet Take 1 tablet (600 mg total) by mouth every 8 (eight) hours as needed. 30 tablet 0   metoprolol tartrate (LOPRESSOR) 25 MG tablet TAKE 1/2 TABLET(12.5 MG) BY MOUTH TWICE DAILY 90 tablet 1   omeprazole (PRILOSEC) 20 MG capsule Take 1 capsule (20 mg total) by mouth daily. 30 capsule 5   ondansetron (ZOFRAN-ODT) 4 MG disintegrating tablet Take 1 tablet (4 mg total) by mouth every 8 (eight) hours as needed for nausea or vomiting. 20 tablet 0   rosuvastatin (CRESTOR) 10 MG tablet TAKE 1 TABLET(10 MG) BY MOUTH DAILY 90 tablet 3   triamcinolone ointment (KENALOG) 0.1 % APPLY TOPICALLY TO THE AFFECTED AREA TWICE DAILY 30 g 0   amoxicillin-clavulanate (AUGMENTIN) 875-125 MG tablet Take 1 tablet by mouth every 12 (twelve) hours. 14 tablet 0   cyclobenzaprine (FLEXERIL) 10 MG tablet Take 1 tablet (10 mg total) by mouth 3 (three) times daily as needed for muscle spasms. 30 tablet 0   HYDROcodone-acetaminophen (NORCO/VICODIN) 5-325 MG tablet Take 1 tablet by mouth every 6 (six) hours as needed for severe pain. 10 tablet 0   methocarbamol (ROBAXIN) 500 MG tablet Take 1 tablet (500 mg total) by mouth 3 (three) times daily. 21 tablet 0   MOUNJARO 5 MG/0.5ML Pen ADMINISTER 5MG UNDER THE SKIN 1 TIME A WEEK 2 mL 2   predniSONE (DELTASONE) 10 MG tablet Take 1 tablet (10 mg total) by mouth 3 (three) times daily. 42 tablet 0   No facility-administered medications prior to visit.    No Known Allergies  ROS Review of Systems  Constitutional:  Negative for chills and fever.  HENT:  Negative for congestion and sore throat.   Eyes:  Negative for pain and discharge.  Respiratory:  Negative for cough and shortness of breath.   Cardiovascular:  Negative for chest pain and palpitations.  Gastrointestinal:  Negative for constipation, diarrhea,  nausea and vomiting.  Endocrine: Negative for polydipsia and polyuria.  Genitourinary:  Negative for dysuria and hematuria.  Musculoskeletal:  Positive for arthralgias. Negative for neck pain and neck stiffness.  Skin:  Negative for rash.  Neurological:  Negative for dizziness, weakness, numbness and headaches.  Psychiatric/Behavioral:  Negative for agitation and behavioral problems.       Objective:    Physical Exam Vitals reviewed.  Constitutional:      General: He is not in acute distress.    Appearance: He is obese.   He is not diaphoretic.  HENT:     Head: Normocephalic and atraumatic.     Nose: Nose normal.     Mouth/Throat:     Mouth: Mucous membranes are moist.  Eyes:     General: No scleral icterus.    Extraocular Movements: Extraocular movements intact.  Cardiovascular:     Rate and Rhythm: Normal rate and regular rhythm.     Pulses: Normal pulses.     Heart sounds: Normal heart sounds. No murmur heard. Pulmonary:     Breath sounds: Normal breath sounds. No wheezing or rales.  Abdominal:     Palpations: Abdomen is soft.     Tenderness: There is no abdominal tenderness.  Musculoskeletal:     Cervical back: Neck supple. No tenderness.     Right lower leg: No edema.     Left lower leg: No edema.  Skin:    General: Skin is warm.     Findings: Rash (stasis dermatitis b/l) present.  Neurological:     General: No focal deficit present.     Mental Status: He is alert and oriented to person, place, and time.  Psychiatric:        Mood and Affect: Mood normal.        Behavior: Behavior normal.     BP 113/71 (BP Location: Left Arm, Patient Position: Sitting, Cuff Size: Large)   Pulse 71   Ht 6' 5" (1.956 m)   Wt (!) 366 lb 12.8 oz (166.4 kg)   SpO2 94%   BMI 43.50 kg/m  Wt Readings from Last 3 Encounters:  08/16/22 (!) 366 lb 12.8 oz (166.4 kg)  06/07/22 (!) 365 lb 6.4 oz (165.7 kg)  06/03/22 (!) 360 lb (163.3 kg)    Lab Results  Component Value Date   TSH  2.170 02/13/2022   Lab Results  Component Value Date   WBC 11.8 (H) 06/03/2022   HGB 15.8 06/03/2022   HCT 50.1 06/03/2022   MCV 79.9 (L) 06/03/2022   PLT 229 06/03/2022   Lab Results  Component Value Date   NA 137 06/03/2022   K 4.2 06/03/2022   CO2 26 06/03/2022   GLUCOSE 99 06/03/2022   BUN 19 06/03/2022   CREATININE 1.09 06/03/2022   BILITOT 0.4 02/13/2022   ALKPHOS 67 02/13/2022   AST 21 02/13/2022   ALT 18 02/13/2022   PROT 7.6 02/13/2022   ALBUMIN 4.5 02/13/2022   CALCIUM 8.9 06/03/2022   ANIONGAP 10 06/03/2022   EGFR 88 02/13/2022   Lab Results  Component Value Date   CHOL 119 02/13/2022   Lab Results  Component Value Date   HDL 44 02/13/2022   Lab Results  Component Value Date   LDLCALC 59 02/13/2022   Lab Results  Component Value Date   TRIG 80 02/13/2022   Lab Results  Component Value Date   CHOLHDL 2.7 02/13/2022   Lab Results  Component Value Date   HGBA1C 6.0 (H) 02/13/2022      Assessment & Plan:   Problem List Items Addressed This Visit       Cardiovascular and Mediastinum   HTN (hypertension) (Chronic)    BP Readings from Last 1 Encounters:  08/16/22 113/71  Well-controlled with Enalapril, Chlorthalidone and Metoprolol Counseled for compliance with the medications Advised DASH diet and moderate exercise/walking, at least 150 mins/week        Endocrine   Type 2 diabetes mellitus with morbid obesity (HCC) - Primary    Lab Results    Component Value Date   HGBA1C 6.0 (H) 02/13/2022  Well controlled Contunue Mounjaro for additional weight loss benefit, 5 mg now Advised to follow diabetic diet On ACEi and statin F/u CMP and lipid panel Diabetic eye exam: Advised to follow up with Ophthalmology for diabetic eye exam      Relevant Medications   tirzepatide [facility]) 5 MG/0.5ML Pen   Other Relevant Orders   Hemoglobin A1c   CMP14+EGFR   Urine Microalbumin w/creat. ratio     Other   Mixed hyperlipidemia    On  Crestor Last lipid profile reviewed      Bilateral hand pain    Likely OA of MCP joints Has pain, swelling and morning stiffness Advised to use Voltaren gel as needed      Other Visit Diagnoses     Need for zoster vaccination       Relevant Orders   Varicella-zoster vaccine IM       Meds ordered this encounter  Medications   tirzepatide (MOUNJARO) 5 MG/0.5ML Pen    Sig: Inject 5 mg into the skin once a week.    Dispense:  2 mL    Refill:  5    Follow-up: Return in about 6 months (around 02/14/2023) for Annual physical.    Lindell Spar, MD

## 2022-08-16 NOTE — Assessment & Plan Note (Addendum)
Lab Results  Component Value Date   HGBA1C 6.0 (H) 02/13/2022   Well controlled Contunue Mounjaro for additional weight loss benefit, 5 mg now Advised to follow diabetic diet On ACEi and statin F/u CMP and lipid panel Diabetic eye exam: Advised to follow up with Ophthalmology for diabetic eye exam

## 2022-08-16 NOTE — Patient Instructions (Signed)
Please apply Voltaren gel over small joints of hands for pain/swelling.  Please continue taking other medications as prescribed.  Please continue to follow low carb diet and perform moderate exercise/walking at least 150 mins/week.

## 2022-08-16 NOTE — Assessment & Plan Note (Signed)
Likely OA of MCP joints Has pain, swelling and morning stiffness Advised to use Voltaren gel as needed

## 2022-08-18 LAB — MICROALBUMIN / CREATININE URINE RATIO
Creatinine, Urine: 84.8 mg/dL
Microalb/Creat Ratio: <4 mg/g creat (ref 0–29)
Microalbumin, Urine: <3 ug/mL

## 2022-08-18 LAB — CMP14+EGFR
ALT: 16 IU/L (ref 0–44)
AST: 24 IU/L (ref 0–40)
Albumin/Globulin Ratio: 1.3 (ref 1.2–2.2)
Albumin: 4.4 g/dL (ref 3.8–4.9)
Alkaline Phosphatase: 74 IU/L (ref 44–121)
BUN/Creatinine Ratio: 22 — ABNORMAL HIGH (ref 9–20)
BUN: 22 mg/dL (ref 6–24)
Bilirubin Total: 0.3 mg/dL (ref 0.0–1.2)
CO2: 22 mmol/L (ref 20–29)
Calcium: 9.3 mg/dL (ref 8.7–10.2)
Chloride: 102 mmol/L (ref 96–106)
Creatinine, Ser: 0.99 mg/dL (ref 0.76–1.27)
Globulin, Total: 3.3 g/dL (ref 1.5–4.5)
Glucose: 89 mg/dL (ref 70–99)
Potassium: 3.7 mmol/L (ref 3.5–5.2)
Sodium: 140 mmol/L (ref 134–144)
Total Protein: 7.7 g/dL (ref 6.0–8.5)
eGFR: 92 mL/min/{1.73_m2} (ref >59)

## 2022-08-18 LAB — HEMOGLOBIN A1C
Est. average glucose Bld gHb Est-mCnc: 126 mg/dL
Hgb A1c MFr Bld: 6 % — ABNORMAL HIGH (ref 4.8–5.6)

## 2022-08-25 ENCOUNTER — Ambulatory Visit: Payer: BC Managed Care – PPO | Admitting: Podiatry

## 2022-08-30 ENCOUNTER — Other Ambulatory Visit: Payer: Self-pay | Admitting: Family Medicine

## 2022-09-02 ENCOUNTER — Other Ambulatory Visit: Payer: Self-pay

## 2022-09-02 ENCOUNTER — Ambulatory Visit
Admission: EM | Admit: 2022-09-02 | Discharge: 2022-09-02 | Disposition: A | Discharge status: Home / Self Care | Payer: BC Managed Care – PPO | Attending: Family Medicine | Admitting: Family Medicine

## 2022-09-02 ENCOUNTER — Encounter: Payer: Self-pay | Admitting: Emergency Medicine

## 2022-09-02 DIAGNOSIS — R112 Nausea with vomiting, unspecified: Secondary | ICD-10-CM

## 2022-09-02 DIAGNOSIS — R197 Diarrhea, unspecified: Secondary | ICD-10-CM

## 2022-09-02 MED ORDER — ONDANSETRON 4 MG PO TBDP: 4.0000 mg | ORAL_TABLET | Freq: Three times a day (TID) | ORAL | 0 refills | Status: DC | PRN

## 2022-09-02 MED ORDER — ONDANSETRON 8 MG PO TBDP
8.0000 mg | ORAL_TABLET | Freq: Once | ORAL | Status: AC
  Administered 2022-09-02: 8 mg via ORAL

## 2022-09-02 MED ORDER — LOPERAMIDE HCL 2 MG PO CAPS: 2.0000 mg | ORAL_CAPSULE | Freq: Four times a day (QID) | ORAL | 0 refills | Status: AC | PRN

## 2022-09-02 NOTE — ED Notes (Signed)
Pt given bottled water for PO challenge.Tolerating well at this time.

## 2022-09-02 NOTE — ED Triage Notes (Signed)
Pt reports lower abd pain, headache,emesis, diarrhea x1 week. Pt reports has taken stool softener and states usually has BM every 2 days but reports was constipated prior to symptoms starting. Denies any known fevers.

## 2022-09-03 ENCOUNTER — Emergency Department (HOSPITAL_COMMUNITY)
Admission: EM | Admit: 2022-09-03 | Discharge: 2022-09-03 | Disposition: A | Discharge status: Home / Self Care | Payer: BC Managed Care – PPO | Attending: Emergency Medicine | Admitting: Emergency Medicine

## 2022-09-03 ENCOUNTER — Encounter (HOSPITAL_COMMUNITY): Payer: Self-pay | Admitting: Emergency Medicine

## 2022-09-03 ENCOUNTER — Emergency Department (HOSPITAL_COMMUNITY): Payer: BC Managed Care – PPO

## 2022-09-03 DIAGNOSIS — Z79899 Other long term (current) drug therapy: Secondary | ICD-10-CM | POA: Diagnosis not present

## 2022-09-03 DIAGNOSIS — R109 Unspecified abdominal pain: Secondary | ICD-10-CM | POA: Diagnosis not present

## 2022-09-03 DIAGNOSIS — K5730 Diverticulosis of large intestine without perforation or abscess without bleeding: Secondary | ICD-10-CM | POA: Diagnosis not present

## 2022-09-03 DIAGNOSIS — N2889 Other specified disorders of kidney and ureter: Secondary | ICD-10-CM | POA: Diagnosis not present

## 2022-09-03 DIAGNOSIS — R1084 Generalized abdominal pain: Secondary | ICD-10-CM

## 2022-09-03 DIAGNOSIS — I880 Nonspecific mesenteric lymphadenitis: Secondary | ICD-10-CM | POA: Insufficient documentation

## 2022-09-03 DIAGNOSIS — Z794 Long term (current) use of insulin: Secondary | ICD-10-CM | POA: Insufficient documentation

## 2022-09-03 DIAGNOSIS — I10 Essential (primary) hypertension: Secondary | ICD-10-CM | POA: Insufficient documentation

## 2022-09-03 DIAGNOSIS — E119 Type 2 diabetes mellitus without complications: Secondary | ICD-10-CM | POA: Diagnosis not present

## 2022-09-03 LAB — COMPREHENSIVE METABOLIC PANEL
ALT: 41 U/L (ref 0–44)
AST: 88 U/L — ABNORMAL HIGH (ref 15–41)
Albumin: 3.6 g/dL (ref 3.5–5.0)
Alkaline Phosphatase: 76 U/L (ref 38–126)
Anion gap: 8 (ref 5–15)
BUN: 18 mg/dL (ref 6–20)
CO2: 25 mmol/L (ref 22–32)
Calcium: 8 mg/dL — ABNORMAL LOW (ref 8.9–10.3)
Chloride: 102 mmol/L (ref 98–111)
Creatinine, Ser: 1.04 mg/dL (ref 0.61–1.24)
GFR, Estimated: >60 mL/min (ref >60)
Glucose, Bld: 102 mg/dL — ABNORMAL HIGH (ref 70–99)
Potassium: 3 mmol/L — ABNORMAL LOW (ref 3.5–5.1)
Sodium: 135 mmol/L (ref 135–145)
Total Bilirubin: 0.6 mg/dL (ref 0.3–1.2)
Total Protein: 7.4 g/dL (ref 6.5–8.1)

## 2022-09-03 LAB — CBC WITH DIFFERENTIAL/PLATELET
Abs Immature Granulocytes: 0.03 10*3/uL (ref 0.00–0.07)
Basophils Absolute: 0 10*3/uL (ref 0.0–0.1)
Basophils Relative: 0 %
Eosinophils Absolute: 0.1 10*3/uL (ref 0.0–0.5)
Eosinophils Relative: 1 %
HCT: 46 % (ref 39.0–52.0)
Hemoglobin: 14.7 g/dL (ref 13.0–17.0)
Immature Granulocytes: 0 %
Lymphocytes Relative: 15 %
Lymphs Abs: 1.3 10*3/uL (ref 0.7–4.0)
MCH: 25.9 pg — ABNORMAL LOW (ref 26.0–34.0)
MCHC: 32 g/dL (ref 30.0–36.0)
MCV: 81 fL (ref 80.0–100.0)
Monocytes Absolute: 0.5 10*3/uL (ref 0.1–1.0)
Monocytes Relative: 6 %
Neutro Abs: 6.7 10*3/uL (ref 1.7–7.7)
Neutrophils Relative %: 78 %
Platelets: 186 10*3/uL (ref 150–400)
RBC: 5.68 MIL/uL (ref 4.22–5.81)
RDW: 16.2 % — ABNORMAL HIGH (ref 11.5–15.5)
WBC: 8.7 10*3/uL (ref 4.0–10.5)
nRBC: 0 % (ref 0.0–0.2)

## 2022-09-03 LAB — LIPASE, BLOOD: Lipase: 29 U/L (ref 11–51)

## 2022-09-03 MED ORDER — ONDANSETRON 8 MG PO TBDP: ORAL_TABLET | ORAL | 0 refills | Status: DC

## 2022-09-03 MED ORDER — MORPHINE SULFATE (PF) 4 MG/ML IV SOLN
4.0000 mg | Freq: Once | INTRAVENOUS | Status: AC
  Administered 2022-09-03: 4 mg via INTRAVENOUS
  Filled 2022-09-03: qty 1

## 2022-09-03 MED ORDER — SODIUM CHLORIDE 0.9 % IV BOLUS
1000.0000 mL | Freq: Once | INTRAVENOUS | Status: AC
  Administered 2022-09-03: 1000 mL via INTRAVENOUS

## 2022-09-03 MED ORDER — ONDANSETRON HCL 4 MG/2ML IJ SOLN
4.0000 mg | Freq: Once | INTRAMUSCULAR | Status: AC
  Administered 2022-09-03: 4 mg via INTRAVENOUS
  Filled 2022-09-03: qty 2

## 2022-09-03 MED ORDER — IOHEXOL 300 MG/ML  SOLN
100.0000 mL | Freq: Once | INTRAMUSCULAR | Status: AC | PRN
  Administered 2022-09-03: 100 mL via INTRAVENOUS

## 2022-09-03 MED ORDER — HYDROCODONE-ACETAMINOPHEN 5-325 MG PO TABS: 1.0000 | ORAL_TABLET | Freq: Four times a day (QID) | ORAL | 0 refills | Status: DC | PRN

## 2022-09-03 NOTE — Discharge Instructions (Signed)
Take hydrocodone as prescribed as needed for pain.  Begin taking Zofran as prescribed as needed for nausea.  Follow-up with primary doctor if not improving in the next week, and return to the ER if you develop severe abdominal pain, high fevers, bloody stools, or for other new and concerning symptoms.

## 2022-09-03 NOTE — ED Provider Notes (Signed)
[facility] EMERGENCY DEPARTMENT Provider Note   CSN: [id number] Arrival date & time: 09/03/22  0121     History  Chief Complaint  Patient presents with   Abdominal Pain    James Rivas is a 51 y.o. male.  This patient is a 51 year old male with past medical history of diabetes, hypertension, GERD, hyperlipidemia.  Patient presenting today with complaints of abdominal pain and loose stools.  This has been worsening over the past week.  All have been nonbloody and nonbilious.  He reports vomiting yesterday.  He was seen earlier today at urgent care for the same and was told he likely had a stomach virus.  He was given medication, then discharged.  He presents tonight stating that his pain is worsening and he feels worse.  The history is provided by the patient.       Home Medications Prior to Admission medications   Medication Sig Start Date End Date Taking? Authorizing Provider  Accu-Chek Softclix Lancets lancets Use as instructed 01/05/22   Lindell Spar, MD  allopurinol (ZYLOPRIM) 300 MG tablet TAKE 1 TABLET(300 MG) BY MOUTH DAILY 03/06/22   Sanjuana Kava, MD  blood glucose meter kit and supplies Dispense based on patient and insurance preference. Use daily to check blood sugar.. (FOR ICD-10 E10.9, E11.9). 06/23/21   Noreene Larsson, NP  chlorthalidone (HYGROTON) 25 MG tablet TAKE 1 TABLET(25 MG) BY MOUTH DAILY 07/14/22   Lindell Spar, MD  enalapril (VASOTEC) 20 MG tablet TAKE 1 TABLET(20 MG) BY MOUTH DAILY 04/13/22   Lindell Spar, MD  glucose blood test strip Use daily to check blood sugar. 09/19/21   Lindell Spar, MD  ibuprofen (ADVIL) 600 MG tablet Take 1 tablet (600 mg total) by mouth every 8 (eight) hours as needed. 04/21/22   Renee Rival, FNP  loperamide (IMODIUM) 2 MG capsule Take 1 capsule (2 mg total) by mouth 4 (four) times daily as needed for diarrhea or loose stools. 09/02/22   [name] American, PA-C  metoprolol tartrate (LOPRESSOR) 25 MG tablet TAKE  1/2 TABLET(12.5 MG) BY MOUTH TWICE DAILY 08/30/22   Fayrene Helper, MD  omeprazole (PRILOSEC) 20 MG capsule Take 1 capsule (20 mg total) by mouth daily. 02/13/22   Lindell Spar, MD  ondansetron (ZOFRAN-ODT) 4 MG disintegrating tablet Take 1 tablet (4 mg total) by mouth every 8 (eight) hours as needed for nausea or vomiting. 06/03/22   Carmin Muskrat, MD  ondansetron (ZOFRAN-ODT) 4 MG disintegrating tablet Take 1 tablet (4 mg total) by mouth every 8 (eight) hours as needed for nausea or vomiting. 09/02/22   [name] American, PA-C  rosuvastatin (CRESTOR) 10 MG tablet TAKE 1 TABLET(10 MG) BY MOUTH DAILY 07/03/22   Lindell Spar, MD  tirzepatide [facility]) 5 MG/0.5ML Pen Inject 5 mg into the skin once a week. 08/16/22   Lindell Spar, MD  triamcinolone ointment (KENALOG) 0.1 % APPLY TOPICALLY TO THE AFFECTED AREA TWICE DAILY 01/30/22   Fayrene Helper, MD      Allergies    Patient has no known allergies.    Review of Systems   Review of Systems  All other systems reviewed and are negative.   Physical Exam Updated Vital Signs BP (!) 145/86 (BP Location: Right Arm)   Pulse 81   Temp 99.7 F (37.6 C) (Oral)   Resp 20   Ht _0  (1.956 m)   Wt (!) 166.4 kg   SpO2 100%  BMI 43.50 kg/m  Physical Exam Vitals and nursing note reviewed.  Constitutional:      General: He is not in acute distress.    Appearance: He is well-developed. He is not diaphoretic.  HENT:     Head: Normocephalic and atraumatic.  Cardiovascular:     Rate and Rhythm: Normal rate and regular rhythm.     Heart sounds: No murmur heard.    No friction rub.  Pulmonary:     Effort: Pulmonary effort is normal. No respiratory distress.     Breath sounds: Normal breath sounds. No wheezing or rales.  Abdominal:     General: Bowel sounds are normal. There is no distension.     Palpations: Abdomen is soft.     Tenderness: There is abdominal tenderness in the periumbilical area. There is no right CVA  tenderness, left CVA tenderness, guarding or rebound.  Musculoskeletal:        General: Normal range of motion.     Cervical back: Normal range of motion and neck supple.  Skin:    General: Skin is warm and dry.  Neurological:     Mental Status: He is alert and oriented to person, place, and time.     Coordination: Coordination normal.     ED Results / Procedures / Treatments   Labs (all labs ordered are listed, but only abnormal results are displayed) Labs Reviewed - No data to display  EKG None  Radiology No results found.  Procedures Procedures    Medications Ordered in ED Medications  sodium chloride 0.9 % bolus 1,000 mL (has no administration in time range)  ondansetron (ZOFRAN) injection 4 mg (has no administration in time range)  morphine (PF) 4 MG/ML injection 4 mg (has no administration in time range)    ED Course/ Medical Decision Making/ A&P  Patient is a 51 year old male presenting with complaints of abdominal pain as described in the HPI.  He arrives with stable vital signs and is afebrile.  His examination reveals generalized abdominal tenderness without peritoneal signs.  He is otherwise well-appearing.  Work-up initiated including CBC, comprehensive metabolic panel, and lipase.  These have all returned unremarkable.  CT scan of the abdomen and pelvis was also obtained to further evaluate the cause of his pain and rule out a surgical cause.  This study showed evidence for mesenteric adenitis, and possibly mild pancreatitis.  Lipase is normal so I am uncertain as to the significance of this finding.  IV hydration given along with medicine for pain and nausea.  Patient seems to be feeling improved.  He still complains of discomfort and I feel as though a limited quantity of pain medication is reasonable.  He will be given hydrocodone for pain and Zofran for nausea.  He understands to return if his symptoms worsen or change.  Final Clinical Impression(s) / ED  Diagnoses Final diagnoses:  None    Rx / DC Orders ED Discharge Orders     None         Veryl Speak, MD 09/03/22 [phone]

## 2022-09-03 NOTE — ED Triage Notes (Signed)
Pt c/o lower/mid abd pain, emesis and diarrhea x1 wk. Pt seen at UC this morning for same.

## 2022-09-04 ENCOUNTER — Encounter: Payer: Self-pay | Admitting: Internal Medicine

## 2022-09-04 ENCOUNTER — Ambulatory Visit (INDEPENDENT_AMBULATORY_CARE_PROVIDER_SITE_OTHER): Payer: BC Managed Care – PPO | Admitting: Internal Medicine

## 2022-09-04 VITALS — BP 132/84 | HR 72 | Resp 18 | Ht 77.0 in | Wt 364.8 lb

## 2022-09-04 DIAGNOSIS — Z09 Encounter for follow-up examination after completed treatment for conditions other than malignant neoplasm: Secondary | ICD-10-CM | POA: Diagnosis not present

## 2022-09-04 DIAGNOSIS — I880 Nonspecific mesenteric lymphadenitis: Secondary | ICD-10-CM

## 2022-09-04 DIAGNOSIS — E1169 Type 2 diabetes mellitus with other specified complication: Secondary | ICD-10-CM | POA: Diagnosis not present

## 2022-09-04 NOTE — Patient Instructions (Signed)
Please continue taking medications as prescribed.  Please continue to follow small, frequent meals. Take Zofran as needed for nausea.
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
## 2022-09-05 ENCOUNTER — Telehealth: Payer: Self-pay

## 2022-09-05 ENCOUNTER — Encounter: Payer: Self-pay | Admitting: *Deleted

## 2022-09-05 DIAGNOSIS — Z09 Encounter for follow-up examination after completed treatment for conditions other than malignant neoplasm: Secondary | ICD-10-CM | POA: Insufficient documentation

## 2022-09-05 DIAGNOSIS — I880 Nonspecific mesenteric lymphadenitis: Secondary | ICD-10-CM | POA: Insufficient documentation

## 2022-09-05 NOTE — Progress Notes (Signed)
Established Patient Office Visit  Subjective:  Patient ID: James Rivas, male    DOB: 22-Oct-1971  Age: 51 y.o. MRN: [id number]  CC:  Chief Complaint  Patient presents with   Follow-up    ER follow up pt was in ER 09-03-22 infection in intestines was given nausea dirrhea and sodium tabs plus pain meds feels okay today    HPI James Rivas is a 51 y.o. male with past medical history of HTN, type II DM with morbid obesity, OSA, gout and HLD who presents for f/u of recent ER visit for abdominal pain and nausea.  He had abdominal pain and nausea with diarrhea for the last 2 days, for which he went to ER and had a CT abdomen done.  It showed mesenteric adenitis and/or viral enteritis.  He denies any fever or chills.  Today, he reports resolution of his abdominal pain.  He was given Norco for abdominal pain and Zofran for nausea.  He denies any episode of diarrhea since yesterday.  Of note, he took Imodium 2 days ago.  He denies any melena or hematochezia.   Past Medical History:  Diagnosis Date   Allergy    Arthritis    left knee   Chest pain    Chronic chest pain 06/22/2013   Diabetes mellitus without complication ([place])    Gout    Gout    History of COVID-19 12/20/2020   HTN (hypertension)    Impaired fasting glucose    Morbid obesity (HCC)    Sleep apnea    uses a CPAP   Venous stasis     Past Surgical History:  Procedure Laterality Date   COLONOSCOPY WITH PROPOFOL N/A 05/11/2020   Procedure: COLONOSCOPY WITH PROPOFOL;  Surgeon: Daneil Dolin, MD;  [facility]: [facility];  Service: Endoscopy;  Laterality: N/A;  2:15pm   FINGER SURGERY Left    left ring finger 2 surgeries   I & D EXTREMITY  11/29/2011   Procedure: IRRIGATION AND DEBRIDEMENT EXTREMITY;  Surgeon: Linna Hoff;  Location: [place];  Service: Orthopedics;  Laterality: Left;  I&D Right Long and Index Fingers with Tendon Repair as Needed   KNEE ARTHROSCOPY WITH MEDIAL MENISECTOMY Left 05/17/2021   Procedure: KNEE  ARTHROSCOPY WITH MICROFRACTURE;  Surgeon: Carole Civil, MD;  Location: [facility];  Service: Orthopedics;  Laterality: Left;   MENISECTOMY Left 05/17/2021   Procedure: PARTIAL MEDIAL MENISECTOMY;  Surgeon: Carole Civil, MD;  Location: [facility];  Service: Orthopedics;  Laterality: Left;    Family History  Problem Relation Age of Onset   Diabetes Father    Diabetes Paternal Aunt    Colon cancer Neg Hx     Social History   Socioeconomic History   Marital status: Married    Spouse name: Not on file   Number of children: 3   Years of education: Not on file   Highest education level: Not on file  Occupational History   Occupation: [employer]    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 1 beer or a glass of wine a couple times a week.    Drug use: No   Sexual activity: Never  Other Topics Concern   Not on file  Social History Narrative   Not on file  Social Determinants of Health   Financial Resource Strain: Not on file  Food Insecurity: Not on file  Transportation Needs: Not on file  Physical Activity: Not on file  Stress: Not on file  Social Connections: Not on file  Intimate Partner Violence: Not on file    Outpatient Medications Prior to Visit  Medication Sig Dispense Refill   Accu-Chek Softclix Lancets lancets Use as instructed 100 each 12   allopurinol (ZYLOPRIM) 300 MG tablet TAKE 1 TABLET(300 MG) BY MOUTH DAILY 30 tablet 5   blood glucose meter kit and supplies Dispense based on patient and insurance preference. Use daily to check blood sugar.. (FOR ICD-10 E10.9, E11.9). 1 each 0   chlorthalidone (HYGROTON) 25 MG tablet TAKE 1 TABLET(25 MG) BY MOUTH DAILY 90 tablet 1   enalapril (VASOTEC) 20 MG tablet TAKE 1 TABLET(20 MG) BY MOUTH DAILY 90 tablet 1   glucose blood test strip Use daily to  check blood sugar. 100 each 12   HYDROcodone-acetaminophen (NORCO) 5-325 MG tablet Take 1-2 tablets by mouth every 6 (six) hours as needed. 10 tablet 0   ibuprofen (ADVIL) 600 MG tablet Take 1 tablet (600 mg total) by mouth every 8 (eight) hours as needed. 30 tablet 0   loperamide (IMODIUM) 2 MG capsule Take 1 capsule (2 mg total) by mouth 4 (four) times daily as needed for diarrhea or loose stools. 12 capsule 0   metoprolol tartrate (LOPRESSOR) 25 MG tablet TAKE 1/2 TABLET(12.5 MG) BY MOUTH TWICE DAILY 90 tablet 1   omeprazole (PRILOSEC) 20 MG capsule Take 1 capsule (20 mg total) by mouth daily. 30 capsule 5   ondansetron (ZOFRAN-ODT) 8 MG disintegrating tablet 70m ODT q4 hours prn nausea 10 tablet 0   rosuvastatin (CRESTOR) 10 MG tablet TAKE 1 TABLET(10 MG) BY MOUTH DAILY 90 tablet 3   tirzepatide (MOUNJARO) 5 MG/0.5ML Pen Inject 5 mg into the skin once a week. 2 mL 5   triamcinolone ointment (KENALOG) 0.1 % APPLY TOPICALLY TO THE AFFECTED AREA TWICE DAILY 30 g 0   No facility-administered medications prior to visit.    No Known Allergies  ROS Review of Systems  Constitutional:  Negative for chills and fever.  HENT:  Negative for congestion and sore throat.   Eyes:  Negative for pain and discharge.  Respiratory:  Negative for cough and shortness of breath.   Cardiovascular:  Negative for chest pain and palpitations.  Gastrointestinal:  Positive for abdominal pain, diarrhea and nausea. Negative for constipation and vomiting.  Endocrine: Negative for polydipsia and polyuria.  Genitourinary:  Negative for dysuria and hematuria.  Musculoskeletal:  Positive for arthralgias. Negative for neck pain and neck stiffness.  Skin:  Negative for rash.  Neurological:  Negative for dizziness, weakness, numbness and headaches.  Psychiatric/Behavioral:  Negative for agitation and behavioral problems.       Objective:    Physical Exam Vitals reviewed.  Constitutional:      General: He is not in  acute distress.    Appearance: He is obese. He is not diaphoretic.  HENT:     Head: Normocephalic and atraumatic.     Nose: Nose normal.     Mouth/Throat:     Mouth: Mucous membranes are moist.  Eyes:     General: No scleral icterus.    Extraocular Movements: Extraocular movements intact.  Cardiovascular:     Rate and Rhythm: Normal rate and regular rhythm.     Pulses: Normal pulses.     Heart sounds:  Normal heart sounds. No murmur heard. Pulmonary:     Breath sounds: Normal breath sounds. No wheezing or rales.  Abdominal:     General: Bowel sounds are normal.     Palpations: Abdomen is soft.     Tenderness: There is no abdominal tenderness. There is no guarding or rebound.  Musculoskeletal:     Cervical back: Neck supple. No tenderness.     Right lower leg: No edema.     Left lower leg: No edema.  Skin:    General: Skin is warm.     Findings: Rash (stasis dermatitis b/l) present.  Neurological:     General: No focal deficit present.     Mental Status: He is alert and oriented to person, place, and time.  Psychiatric:        Mood and Affect: Mood normal.        Behavior: Behavior normal.     BP 132/84 (BP Location: Left Arm, Patient Position: Sitting, Cuff Size: Normal)   Pulse 72   Resp 18   Ht 6' 5"  (1.956 m)   Wt (!) 364 lb 12.8 oz (165.5 kg)   SpO2 96%   BMI 43.26 kg/m  Wt Readings from Last 3 Encounters:  09/04/22 (!) 364 lb 12.8 oz (165.5 kg)  09/03/22 (!) 366 lb 12.8 oz (166.4 kg)  08/16/22 (!) 366 lb 12.8 oz (166.4 kg)    Lab Results  Component Value Date   TSH 2.170 02/13/2022   Lab Results  Component Value Date   WBC 8.7 09/03/2022   HGB 14.7 09/03/2022   HCT 46.0 09/03/2022   MCV 81.0 09/03/2022   PLT 186 09/03/2022   Lab Results  Component Value Date   NA 135 09/03/2022   K 3.0 (L) 09/03/2022   CO2 25 09/03/2022   GLUCOSE 102 (H) 09/03/2022   BUN 18 09/03/2022   CREATININE 1.04 09/03/2022   BILITOT 0.6 09/03/2022   ALKPHOS 76  09/03/2022   AST 88 (H) 09/03/2022   ALT 41 09/03/2022   PROT 7.4 09/03/2022   ALBUMIN 3.6 09/03/2022   CALCIUM 8.0 (L) 09/03/2022   ANIONGAP 8 09/03/2022   EGFR 92 08/16/2022   Lab Results  Component Value Date   CHOL 119 02/13/2022   Lab Results  Component Value Date   HDL 44 02/13/2022   Lab Results  Component Value Date   LDLCALC 59 02/13/2022   Lab Results  Component Value Date   TRIG 80 02/13/2022   Lab Results  Component Value Date   CHOLHDL 2.7 02/13/2022   Lab Results  Component Value Date   HGBA1C 6.0 (H) 08/16/2022      Assessment & Plan:   Problem List Items Addressed This Visit       Endocrine   Type 2 diabetes mellitus with morbid obesity ([place])    Lab Results  Component Value Date   HGBA1C 6.0 (H) 08/16/2022  Well controlled Contunue Mounjaro for additional weight loss benefit, 5 mg now - if recurrent abdominal pain or nausea, will DC Mounjaro Advised to follow diabetic diet On ACEi and statin F/u CMP and lipid panel Diabetic eye exam: Advised to follow up with Ophthalmology for diabetic eye exam        Immune and Lymphatic   Mesenteric adenitis - Primary    Abdominal pain with nausea and diarrhea CT abdomen showed mesenteric adenitis and/or viral enteritis Also concern for previous pancreatitis, although lipase was normal Supportive treatment with IV hydration, pain control  and Zofran If he has recurrent episodes, will discontinue Mounjaro        Other   Encounter for examination following treatment at hospital    ER chart reviewed, including blood tests and imaging Abdominal pain now resolved Diarrhea resolved       No orders of the defined types were placed in this encounter.   Follow-up: Return if symptoms worsen or fail to improve.    Lindell Spar, MD

## 2022-09-05 NOTE — Assessment & Plan Note (Signed)
Abdominal pain with nausea and diarrhea CT abdomen showed mesenteric adenitis and/or viral enteritis Also concern for previous pancreatitis, although lipase was normal Supportive treatment with IV hydration, pain control and Zofran If he has recurrent episodes, will discontinue [facility]

## 2022-09-05 NOTE — Assessment & Plan Note (Addendum)
Lab Results  Component Value Date   HGBA1C 6.0 (H) 08/16/2022   Well controlled Contunue Mounjaro for additional weight loss benefit, 5 mg now - if recurrent abdominal pain or nausea, will DC Mounjaro Advised to follow diabetic diet On ACEi and statin F/u CMP and lipid panel Diabetic eye exam: Advised to follow up with Ophthalmology for diabetic eye exam 

## 2022-09-05 NOTE — Assessment & Plan Note (Signed)
ER chart reviewed, including blood tests and imaging Abdominal pain now resolved Diarrhea resolved

## 2022-09-05 NOTE — Telephone Encounter (Signed)
Patient walked in office needs note for working saying out of work last Friday,10/04-2022 and Monday,09/04/2022 . Went back to work today 09/05/2022

## 2022-09-05 NOTE — Telephone Encounter (Signed)
Note provided

## 2022-09-11 ENCOUNTER — Telehealth: Payer: Self-pay | Admitting: Orthopaedic Surgery

## 2022-10-08 ENCOUNTER — Other Ambulatory Visit: Payer: Self-pay | Admitting: Internal Medicine

## 2022-10-08 DIAGNOSIS — I10 Essential (primary) hypertension: Secondary | ICD-10-CM

## 2022-10-14 IMAGING — CT CT RENAL STONE PROTOCOL
2 of 4 series · 16 of 46 positions shown, 18 images · non-contrast
Comparison: June 15, 2013

CLINICAL DATA: Back pain radiating to the right-side.

EXAM:
CT ABDOMEN AND PELVIS WITHOUT CONTRAST
TECHNIQUE: Multidetector CT imaging of the abdomen and pelvis was performed
following the standard protocol without IV contrast.

[Series 2: axial st · axial · 0.98mm/px · z∈[+872,+1332]mm · 13 of 106 slices shown, 15 images]
[im 7/106  soft-tissue]
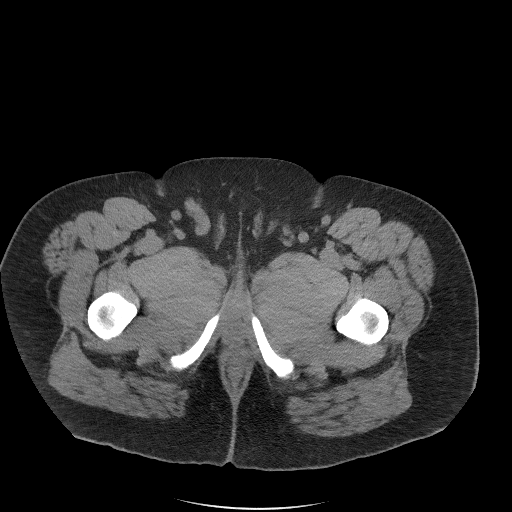
[im 7/106  bone]
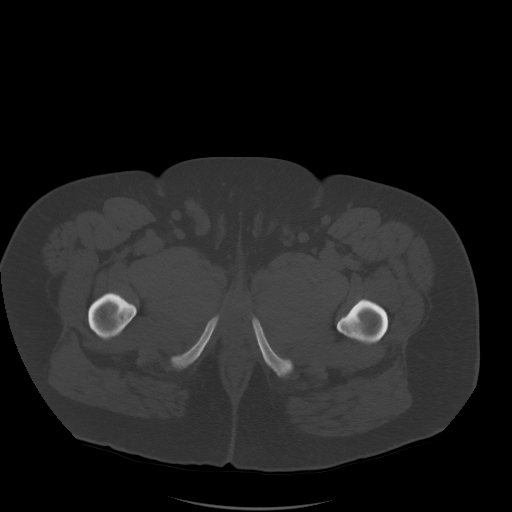
[im 14/106  soft-tissue]
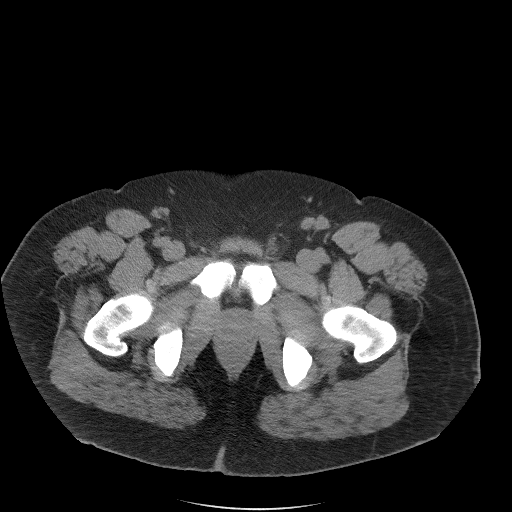
[im 20/106  soft-tissue]
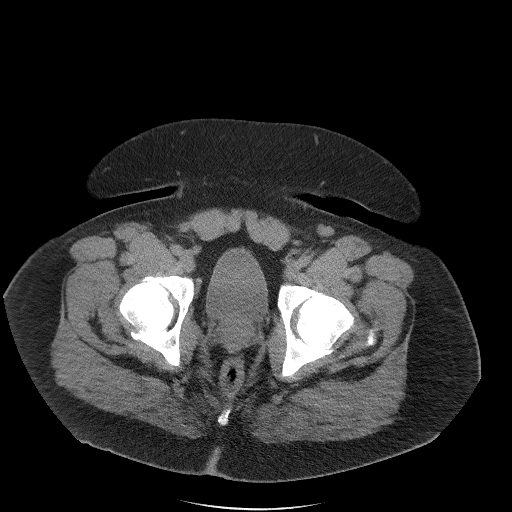
[im 33/106  soft-tissue]
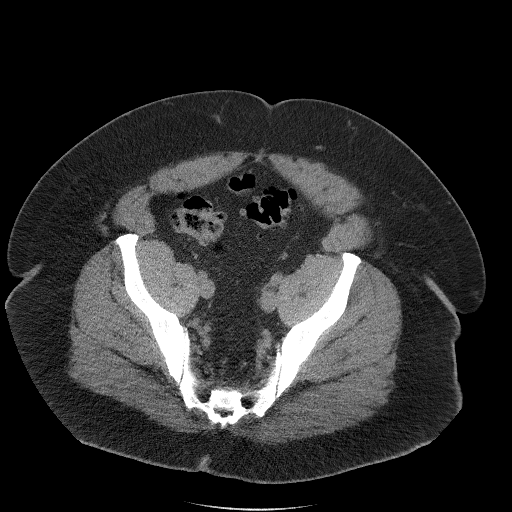
[im 40/106  soft-tissue]
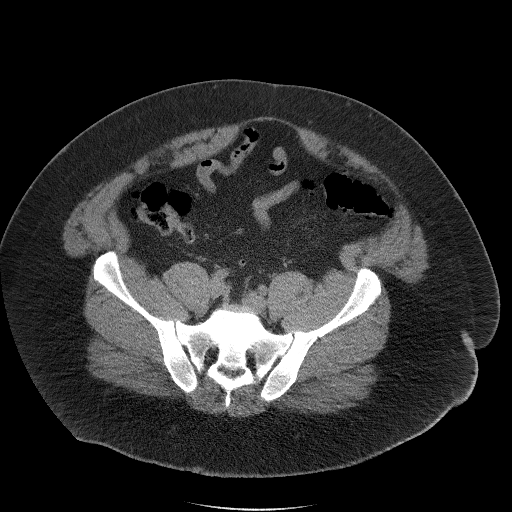
[im 46/106  soft-tissue]
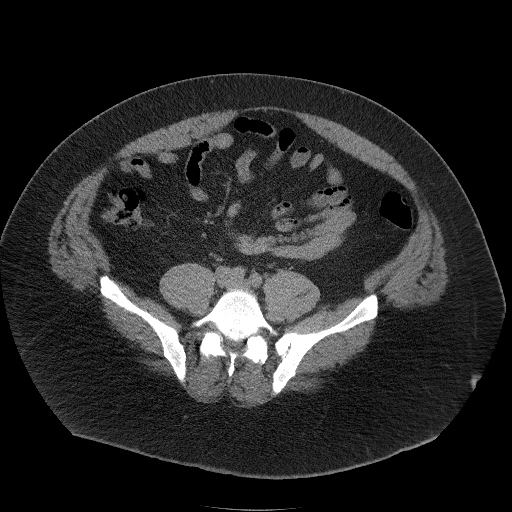
[im 53/106  soft-tissue]
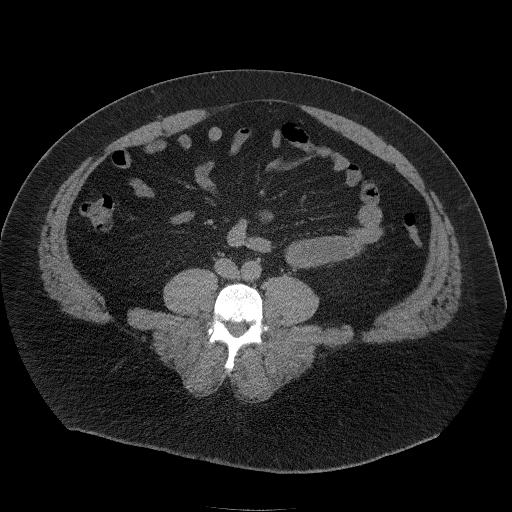
[im 60/106  soft-tissue]
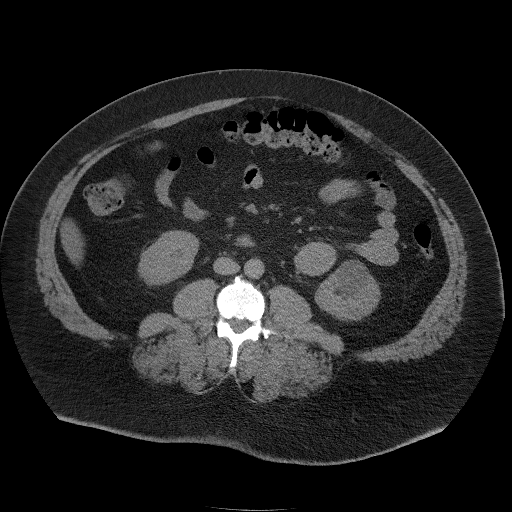
[im 66/106  soft-tissue]
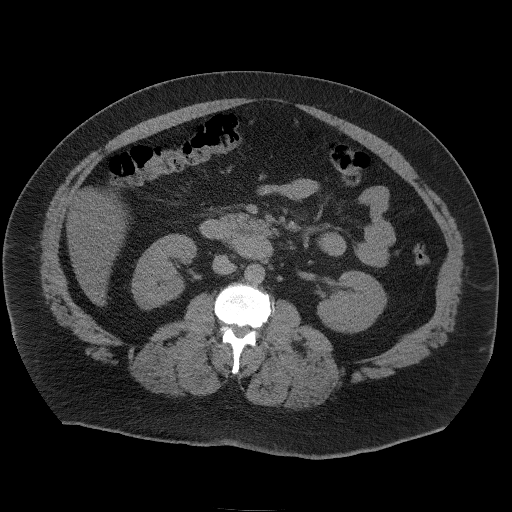
[im 66/106  bone]
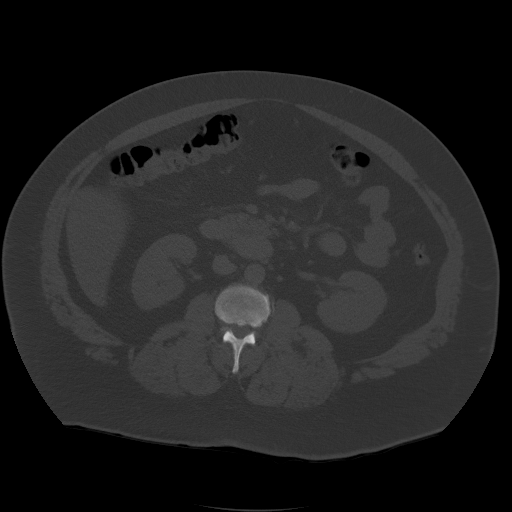
[im 73/106  soft-tissue]
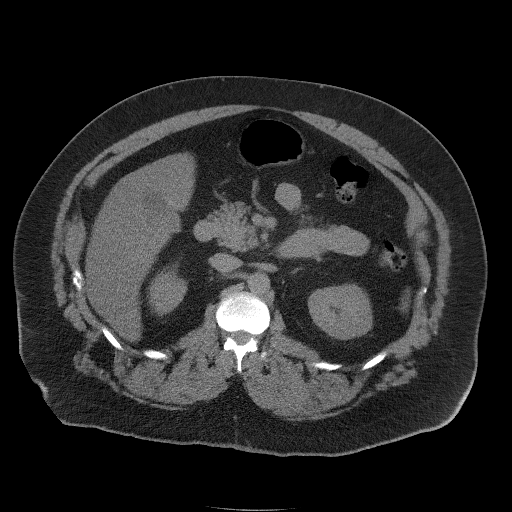
[im 86/106  soft-tissue]
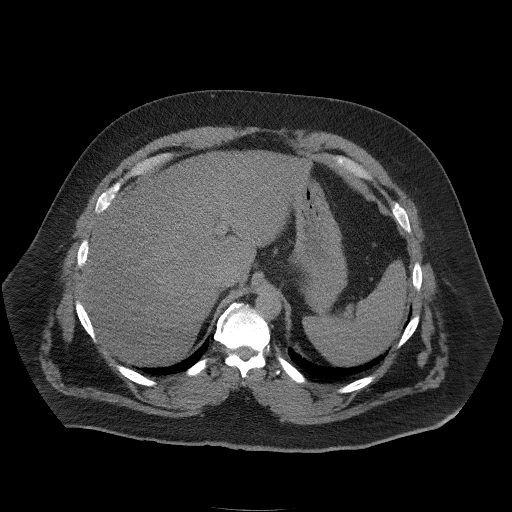
[im 92/106  soft-tissue]
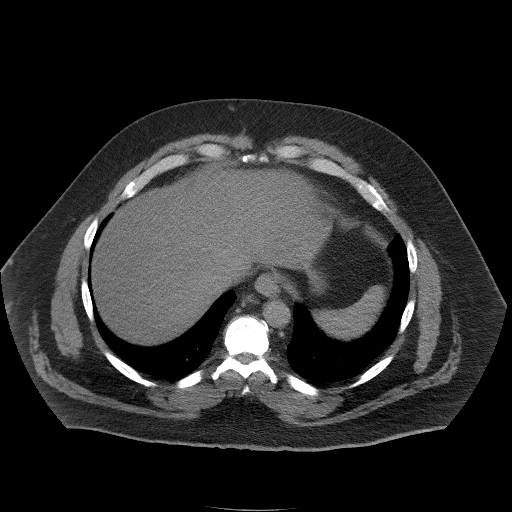
[im 99/106  soft-tissue]
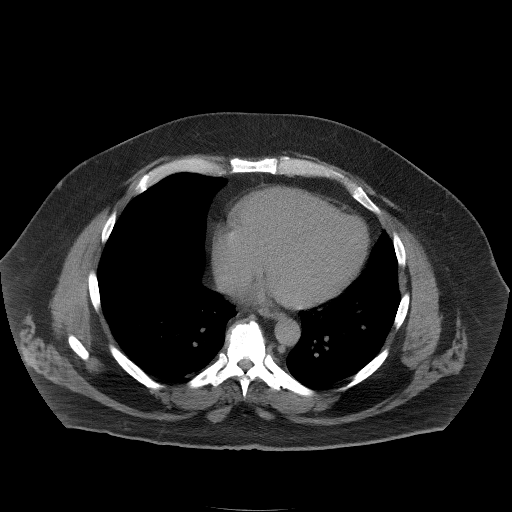

[Series 5: coronal st · coronal · 0.98mm/px · 3 of 142 slices shown]
[im 48/142  soft-tissue]
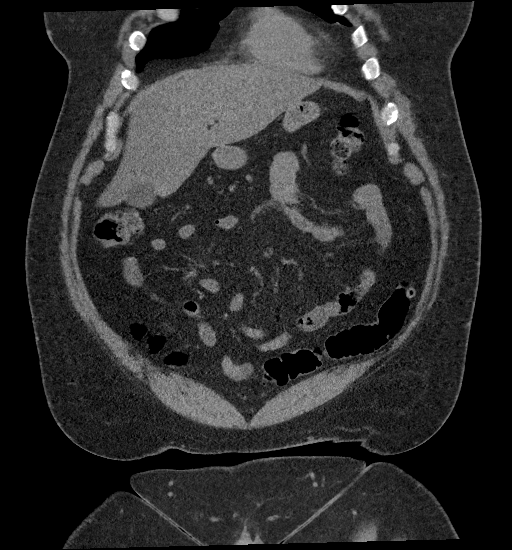
[im 63/142  soft-tissue]
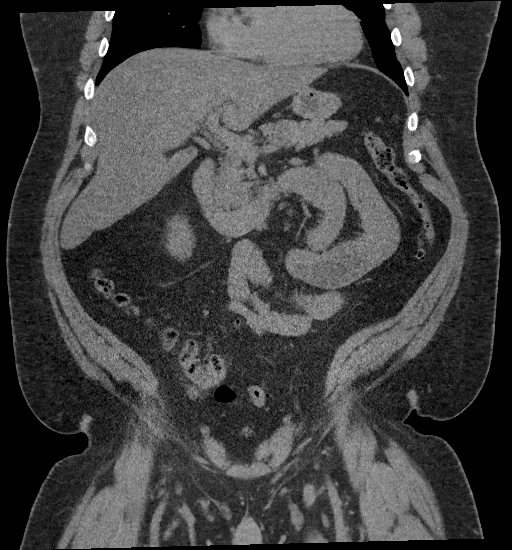
[im 79/142  soft-tissue]
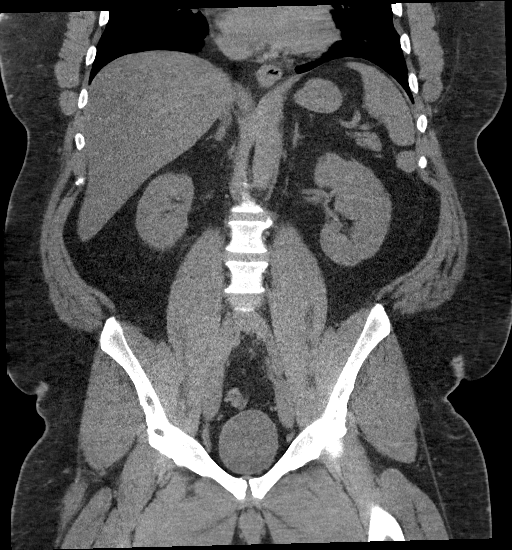

[16 of 46 positions shown; findings below may reference images not displayed]

FINDINGS: Lower chest: There are few ground-glass airspace opacities in the
left lower lobe.The heart size is normal.

Hepatobiliary: There is decreased hepatic attenuation suggestive of
hepatic steatosis. Normal gallbladder.There is no biliary ductal
dilation.

Pancreas: Normal contours without ductal dilatation. No
peripancreatic fluid collection.

Spleen: Unremarkable.

Adrenals/Urinary Tract:

--Adrenal glands: Unremarkable.

--Right kidney/ureter: No hydronephrosis or radiopaque kidney
stones.

--Left kidney/ureter: No hydronephrosis or radiopaque kidney stones.

--Urinary bladder: Unremarkable.

Stomach/Bowel:

--Stomach/Duodenum: No hiatal hernia or other gastric abnormality.
Normal duodenal course and caliber.

--Small bowel: Unremarkable.

--Colon: Rectosigmoid diverticulosis without acute inflammation.

--Appendix: Normal.

Vascular/Lymphatic: Normal course and caliber of the major abdominal
vessels.

--No retroperitoneal lymphadenopathy.

--No mesenteric lymphadenopathy.

--there is bilateral symmetric inguinal adenopathy.

Reproductive: Unremarkable

Other: No ascites or free air. There is a fat containing umbilical
hernia.

Musculoskeletal. No acute displaced fractures.
IMPRESSION: 1. No acute abdominopelvic abnormality.
2. There are few ground-glass airspace opacities in the left lower
lobe, concerning for an infectious or inflammatory process.
3. Hepatic steatosis.
4. Rectosigmoid diverticulosis without acute inflammation.
5. Fat containing umbilical hernia.
6. Bilateral symmetric inguinal adenopathy, likely reactive.

## 2022-11-28 ENCOUNTER — Other Ambulatory Visit: Payer: Self-pay | Admitting: Internal Medicine

## 2022-11-28 DIAGNOSIS — E1169 Type 2 diabetes mellitus with other specified complication: Secondary | ICD-10-CM

## 2022-12-01 ENCOUNTER — Other Ambulatory Visit: Payer: Self-pay | Admitting: Internal Medicine

## 2022-12-01 ENCOUNTER — Telehealth: Payer: Self-pay | Admitting: Internal Medicine

## 2022-12-01 DIAGNOSIS — E1169 Type 2 diabetes mellitus with other specified complication: Secondary | ICD-10-CM

## 2022-12-01 MED ORDER — MOUNJARO 5 MG/0.5ML ~~LOC~~ SOAJ: 5.0000 mg | SUBCUTANEOUS | 1 refills | Status: DC

## 2022-12-01 NOTE — Telephone Encounter (Signed)
Patient called in needs refill wants to have  tirzepatide [facility]) 5 MG/0.5ML Pen  Sent in to [facility] [facility] this med to always be sent to [facility]

## 2022-12-02 ENCOUNTER — Other Ambulatory Visit: Payer: Self-pay | Admitting: Family Medicine

## 2022-12-20 ENCOUNTER — Telehealth: Payer: Self-pay | Admitting: Orthopaedic Surgery

## 2023-01-12 ENCOUNTER — Other Ambulatory Visit: Payer: Self-pay | Admitting: Internal Medicine

## 2023-01-12 DIAGNOSIS — I10 Essential (primary) hypertension: Secondary | ICD-10-CM

## 2023-01-25 ENCOUNTER — Ambulatory Visit: Payer: BC Managed Care – PPO | Admitting: Orthopedic Surgery

## 2023-01-25 ENCOUNTER — Encounter: Payer: Self-pay | Admitting: Orthopedic Surgery

## 2023-01-25 ENCOUNTER — Ambulatory Visit (INDEPENDENT_AMBULATORY_CARE_PROVIDER_SITE_OTHER): Payer: BC Managed Care – PPO

## 2023-01-25 ENCOUNTER — Encounter: Payer: Self-pay | Admitting: Radiology

## 2023-01-25 VITALS — Ht 77.0 in | Wt 369.0 lb

## 2023-01-25 DIAGNOSIS — M238X2 Other internal derangements of left knee: Secondary | ICD-10-CM | POA: Diagnosis not present

## 2023-01-25 DIAGNOSIS — M1712 Unilateral primary osteoarthritis, left knee: Secondary | ICD-10-CM | POA: Diagnosis not present

## 2023-01-25 DIAGNOSIS — G8929 Other chronic pain: Secondary | ICD-10-CM

## 2023-01-25 DIAGNOSIS — M23321 Other meniscus derangements, posterior horn of medial meniscus, right knee: Secondary | ICD-10-CM

## 2023-01-25 DIAGNOSIS — Z9889 Other specified postprocedural states: Secondary | ICD-10-CM | POA: Diagnosis not present

## 2023-01-25 DIAGNOSIS — Z6841 Body Mass Index (BMI) 40.0 and over, adult: Secondary | ICD-10-CM

## 2023-01-25 NOTE — Progress Notes (Signed)
FOLLOW UP   Encounter Diagnoses  Name Primary?   Chronic pain of left knee Yes   Primary osteoarthritis of left knee    S/P arthroscopy of left knee 05/17/21    Chondral defect of condyle of left femur    Derangement of posterior horn of medial meniscus of right knee    Body mass index 45.0-49.9, adult [facility])      Chief Complaint  Patient presents with   Knee Pain    Left    Pertinent medical problems include hypertension diabetes and gout  MEDS: IBUPROFEN , HYDROCODONE  PRIOR TREATMENT:  NSAIDS DICLOFENAC IBUPROFEN NAPROSYN  STEROID INJECTION   ACTIVITY AND JOB MODIFICATION   Preop diagnosis torn medial meniscus left knee  Postop diagnosis torn medial meniscus left knee chondral defect medial femoral condyle left knee  Procedure arthroscopy arthroscopy left knee microfracture medial femoral condyle partial medial meniscectomy - MEDIAL chondral defect medial femoral condyle 10 x 7 full-thickness defect, medial meniscus tear radial tear posterior horn    James Rivas is doing okay.  He still having pain in his left knee especially if he stands for any significant [employer] time which she has to do at work.  He will sit down when he has to.  The job that he had which was easier on his knee was canceled and he had to go back to the job that he had prior to surgery.  This is very difficult for him but he manages by just toughing it out  His range of motion is good he is not limping instability was not an issue no effusion  The x-ray shows more significant narrowing of the medial compartment and worsening of the arthritis  Assessment and plan 52 year old male BMI 43 hypertension diabetes gout previously treated with injection and multiple NSAIDs had surgery including microfracture worsening arthritis on x-ray recommend HA injections if approved

## 2023-01-25 NOTE — Patient Instructions (Signed)
We will check which brand of Hyaluronic acid injections (there are several) your insurance covers. We will call you with price and schedule with you if insurance approves and you are okay with the out of pocket costs. If for any reason they will not cover or the out of pocket costs are high, we will discuss with you and let you know other options available.

## 2023-01-30 ENCOUNTER — Telehealth: Payer: Self-pay | Admitting: Orthopedic Surgery

## 2023-01-30 ENCOUNTER — Telehealth: Payer: Self-pay

## 2023-01-30 NOTE — Telephone Encounter (Signed)
VOB submitted for Supartz Fx, left knee.

## 2023-01-30 NOTE — Telephone Encounter (Signed)
[place] forms reached out to me today regarding this patient's forms.  There is nothing in his chart about being taken out of work or any work restrictions.  I contacted the patient to check with him and he patient stated that some days his knee swells up and he just can't go to work so he has to stay out.  He stated that Dr. Aline Brochure told him that he could get FMLA for days like that.  I don't even know how that works.  Please advise so I can advise [employer].  [employer] will need something for the forms supporting FMLA.

## 2023-01-30 NOTE — Telephone Encounter (Signed)
[employer] stated:  We need a work note from Dr. Aline Brochure for intermittent flare ups.

## 2023-01-30 NOTE — Telephone Encounter (Signed)
Ok to give work note for intermittent flares, they discussed it at office visit and Dr Lemmie Evens said that was fine.

## 2023-02-01 ENCOUNTER — Telehealth: Payer: Self-pay

## 2023-02-01 NOTE — Telephone Encounter (Signed)
Please schedule patient for gel injection All gel information can be found in referral Thank you.

## 2023-02-08 NOTE — Telephone Encounter (Signed)
Noted! Thank you

## 2023-02-12 ENCOUNTER — Telehealth: Payer: Self-pay | Admitting: Orthopedic Surgery

## 2023-02-12 NOTE — Telephone Encounter (Signed)
LVM for the patient that his forms are ready for pickup. 

## 2023-02-16 ENCOUNTER — Encounter: Payer: Self-pay | Admitting: Internal Medicine

## 2023-02-16 ENCOUNTER — Ambulatory Visit (INDEPENDENT_AMBULATORY_CARE_PROVIDER_SITE_OTHER): Payer: BC Managed Care – PPO | Admitting: Internal Medicine

## 2023-02-16 VITALS — BP 107/71 | HR 70 | Ht 77.0 in | Wt 357.6 lb

## 2023-02-16 DIAGNOSIS — I872 Venous insufficiency (chronic) (peripheral): Secondary | ICD-10-CM

## 2023-02-16 DIAGNOSIS — E559 Vitamin D deficiency, unspecified: Secondary | ICD-10-CM | POA: Insufficient documentation

## 2023-02-16 DIAGNOSIS — Z125 Encounter for screening for malignant neoplasm of prostate: Secondary | ICD-10-CM | POA: Diagnosis not present

## 2023-02-16 DIAGNOSIS — E782 Mixed hyperlipidemia: Secondary | ICD-10-CM | POA: Diagnosis not present

## 2023-02-16 DIAGNOSIS — I10 Essential (primary) hypertension: Secondary | ICD-10-CM

## 2023-02-16 DIAGNOSIS — E1169 Type 2 diabetes mellitus with other specified complication: Secondary | ICD-10-CM | POA: Diagnosis not present

## 2023-02-16 DIAGNOSIS — Z0001 Encounter for general adult medical examination with abnormal findings: Secondary | ICD-10-CM | POA: Diagnosis not present

## 2023-02-16 DIAGNOSIS — I8393 Asymptomatic varicose veins of bilateral lower extremities: Secondary | ICD-10-CM

## 2023-02-16 MED ORDER — BETAMETHASONE DIPROPIONATE 0.05 % EX CREA: TOPICAL_CREAM | Freq: Two times a day (BID) | CUTANEOUS | 2 refills | Status: AC

## 2023-02-16 NOTE — Assessment & Plan Note (Signed)
Advised to take vitamin D 5000 IU daily °

## 2023-02-16 NOTE — Assessment & Plan Note (Signed)
Physical exam as documented. ?Fasting blood tests ordered today. ?

## 2023-02-16 NOTE — Assessment & Plan Note (Signed)
Advised to keep legs elevated when possible Betamethasone cream for itching/irritation

## 2023-02-16 NOTE — Patient Instructions (Signed)
Please apply Betamethasone cream over both legs.  Please continue to take medications as prescribed.  Please continue to follow low carb diet and perform moderate exercise/walking at least 150 mins/week.

## 2023-02-16 NOTE — Assessment & Plan Note (Signed)
Could be due to prolonged standing Advised to perform leg elevation and compression stocking Has been evaluated by vascular surgery

## 2023-02-16 NOTE — Assessment & Plan Note (Signed)
Lab Results  Component Value Date   HGBA1C 6.0 (H) 08/16/2022   Well controlled Contunue Mounjaro for additional weight loss benefit, 5 mg now - if recurrent abdominal pain or nausea, will DC Mounjaro Advised to follow diabetic diet On ACEi and statin F/u CMP and lipid panel Diabetic eye exam: Advised to follow up with Ophthalmology for diabetic eye exam

## 2023-02-16 NOTE — Assessment & Plan Note (Signed)
BP Readings from Last 1 Encounters:  02/16/23 107/71   Well-controlled with Enalapril, Chlorthalidone and Metoprolol Counseled for compliance with the medications Advised DASH diet and moderate exercise/walking, at least 150 mins/week

## 2023-02-16 NOTE — Assessment & Plan Note (Signed)
On Crestor Check lipid profile 

## 2023-02-16 NOTE — Assessment & Plan Note (Signed)
Ordered PSA after discussing its limitations for prostate cancer screening, including false positive results leading to additional investigations. 

## 2023-02-16 NOTE — Progress Notes (Signed)
Established Patient Office Visit  Subjective:  Patient ID: James Rivas, male    DOB: 1971-04-07  Age: 52 y.o. MRN: [id number]  CC:  Chief Complaint  Patient presents with   Annual Exam    Patient states he is having issues with his eating habits.    HPI James Rivas is a 52 y.o. male with past medical history of HTN, type II DM with morbid obesity, OSA, gout and HLD who presents for annual physical.  Type II DM: He complains of bloating with Mounjaro. Reports that he has struggled with controlling his food intake at times.  He is trying to follow small, frequent meals.  He denies any polyuria or polyphagia currently.  Denies any recurrent episode of abdominal pain, nausea or vomiting since the last visit.  HTN: BP is well-controlled. Takes medications regularly. Patient denies headache, dizziness, chest pain, dyspnea or palpitations.   Past Medical History:  Diagnosis Date   Allergy    Arthritis    left knee   Chest pain    Chronic chest pain 06/22/2013   Diabetes mellitus without complication ([place])    Gout    Gout    History of COVID-19 12/20/2020   HTN (hypertension)    Impaired fasting glucose    Morbid obesity (HCC)    Sleep apnea    uses a CPAP   Venous stasis     Past Surgical History:  Procedure Laterality Date   COLONOSCOPY WITH PROPOFOL N/A 05/11/2020   Procedure: COLONOSCOPY WITH PROPOFOL;  Surgeon: Daneil Dolin, MD;  [facility];  Service: Endoscopy;  Laterality: N/A;  2:15pm   FINGER SURGERY Left    left ring finger 2 surgeries   I & D EXTREMITY  11/29/2011   Procedure: IRRIGATION AND DEBRIDEMENT EXTREMITY;  Surgeon: Linna Hoff;  Location: [place];  Service: Orthopedics;  Laterality: Left;  I&D Right Long and Index Fingers with Tendon Repair as Needed   KNEE ARTHROSCOPY WITH MEDIAL MENISECTOMY Left 05/17/2021   Procedure: KNEE ARTHROSCOPY WITH MICROFRACTURE;  Surgeon: Carole Civil, MD;  Location: [facility];  Service: Orthopedics;   Laterality: Left;   MENISECTOMY Left 05/17/2021   Procedure: PARTIAL MEDIAL MENISECTOMY;  Surgeon: Carole Civil, MD;  Location: [facility];  Service: Orthopedics;  Laterality: Left;    Family History  Problem Relation Age of Onset   Diabetes Father    Diabetes Paternal Aunt    Colon cancer Neg Hx     Social History   Socioeconomic History   Marital status: Married    Spouse name: Not on file   Number of children: 3   Years of education: Not on file   Highest education level: Not on file  Occupational History   Occupation: [employer]    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 1 beer or a glass of wine a couple times a week.    Drug use: No   Sexual activity: Never  Other Topics Concern   Not on file  Social History Narrative   Not on file   Social Determinants of Health   Financial Resource Strain: Not on file  Food Insecurity: Not on file  Transportation Needs: Not on file  Physical Activity: Not on file  Stress: Not on file  Social Connections: Not on file  Intimate Partner Violence: Not on file    Outpatient Medications Prior to Visit  Medication Sig Dispense Refill   Accu-Chek Softclix Lancets lancets Use as instructed 100 each 12   allopurinol (ZYLOPRIM) 300 MG tablet TAKE 1 TABLET(300 MG) BY MOUTH DAILY 30 tablet 5   blood glucose meter kit and supplies Dispense based on patient and insurance preference. Use daily to check blood sugar.. (FOR ICD-10 E10.9, E11.9). 1 each 0   chlorthalidone (HYGROTON) 25 MG tablet TAKE 1 TABLET(25 MG) BY MOUTH DAILY 90 tablet 1   enalapril (VASOTEC) 20 MG tablet TAKE 1 TABLET(20 MG) BY MOUTH DAILY 90 tablet 1   glucose blood (ONETOUCH VERIO) test strip USE TO CHECK BLOOD SUGAR DAILY 100 strip 1   HYDROcodone-acetaminophen (NORCO) 5-325 MG tablet Take 1-2  tablets by mouth every 6 (six) hours as needed. 10 tablet 0   ibuprofen (ADVIL) 600 MG tablet Take 1 tablet (600 mg total) by mouth every 8 (eight) hours as needed. 30 tablet 0   loperamide (IMODIUM) 2 MG capsule Take 1 capsule (2 mg total) by mouth 4 (four) times daily as needed for diarrhea or loose stools. 12 capsule 0   metoprolol tartrate (LOPRESSOR) 25 MG tablet TAKE 1/2 TABLET(12.5 MG) BY MOUTH TWICE DAILY 90 tablet 1   omeprazole (PRILOSEC) 20 MG capsule Take 1 capsule (20 mg total) by mouth daily. 30 capsule 5   ondansetron (ZOFRAN-ODT) 8 MG disintegrating tablet 8mg  ODT q4 hours prn nausea 10 tablet 0   rosuvastatin (CRESTOR) 10 MG tablet TAKE 1 TABLET(10 MG) BY MOUTH DAILY 90 tablet 3   tirzepatide (MOUNJARO) 5 MG/0.5ML Pen Inject 5 mg into the skin once a week. 6 mL 1   triamcinolone ointment (KENALOG) 0.1 % APPLY TOPICALLY TO THE AFFECTED AREA TWICE DAILY 30 g 0   No facility-administered medications prior to visit.    No Known Allergies  ROS Review of Systems  Constitutional:  Negative for chills and fever.  HENT:  Negative for congestion and sore throat.   Eyes:  Negative for pain and discharge.  Respiratory:  Negative for cough and shortness of breath.   Cardiovascular:  Negative for chest pain and palpitations.  Gastrointestinal:  Negative for constipation, diarrhea, nausea and vomiting.  Endocrine: Negative for polydipsia and polyuria.  Genitourinary:  Negative for dysuria and hematuria.  Musculoskeletal:  Positive for arthralgias. Negative for neck pain and neck stiffness.  Skin:  Negative for rash.  Neurological:  Negative for dizziness, weakness, numbness and headaches.  Psychiatric/Behavioral:  Negative for agitation and behavioral problems.       Objective:    Physical Exam Vitals reviewed.  Constitutional:      General: He is not in acute distress.    Appearance: He is obese. He is not diaphoretic.  HENT:     Head: Normocephalic and atraumatic.      Nose: Nose normal.     Mouth/Throat:     Mouth: Mucous membranes are moist.  Eyes:     General: No scleral icterus.    Extraocular Movements: Extraocular movements intact.  Cardiovascular:     Rate and Rhythm: Normal rate and regular rhythm.     Pulses: Normal pulses.     Heart sounds: Normal heart sounds. No murmur heard. Pulmonary:     Breath sounds: Normal breath sounds. No wheezing or rales.  Abdominal:     Palpations: Abdomen is soft.     Tenderness: There is no abdominal  tenderness.  Musculoskeletal:     Cervical back: Neck supple. No tenderness.     Right lower leg: No edema.     Left lower leg: No edema.  Skin:    General: Skin is warm.     Findings: Rash (stasis dermatitis b/l) present.  Neurological:     General: No focal deficit present.     Mental Status: He is alert and oriented to person, place, and time.     Cranial Nerves: No cranial nerve deficit.     Sensory: No sensory deficit.     Motor: No weakness.  Psychiatric:        Mood and Affect: Mood normal.        Behavior: Behavior normal.     BP 107/71 (BP Location: Right Arm, Patient Position: Sitting, Cuff Size: Large)   Pulse 70   Ht 6\' 5"  (1.956 m)   Wt (!) 357 lb 9.6 oz (162.2 kg)   SpO2 92%   BMI 42.41 kg/m  Wt Readings from Last 3 Encounters:  02/16/23 (!) 357 lb 9.6 oz (162.2 kg)  01/25/23 (!) 369 lb (167.4 kg)  09/04/22 (!) 364 lb 12.8 oz (165.5 kg)    Lab Results  Component Value Date   TSH 2.170 02/13/2022   Lab Results  Component Value Date   WBC 8.7 09/03/2022   HGB 14.7 09/03/2022   HCT 46.0 09/03/2022   MCV 81.0 09/03/2022   PLT 186 09/03/2022   Lab Results  Component Value Date   NA 135 09/03/2022   K 3.0 (L) 09/03/2022   CO2 25 09/03/2022   GLUCOSE 102 (H) 09/03/2022   BUN 18 09/03/2022   CREATININE 1.04 09/03/2022   BILITOT 0.6 09/03/2022   ALKPHOS 76 09/03/2022   AST 88 (H) 09/03/2022   ALT 41 09/03/2022   PROT 7.4 09/03/2022   ALBUMIN 3.6 09/03/2022   CALCIUM  8.0 (L) 09/03/2022   ANIONGAP 8 09/03/2022   EGFR 92 08/16/2022   Lab Results  Component Value Date   CHOL 119 02/13/2022   Lab Results  Component Value Date   HDL 44 02/13/2022   Lab Results  Component Value Date   LDLCALC 59 02/13/2022   Lab Results  Component Value Date   TRIG 80 02/13/2022   Lab Results  Component Value Date   CHOLHDL 2.7 02/13/2022   Lab Results  Component Value Date   HGBA1C 6.0 (H) 08/16/2022      Assessment & Plan:   Problem List Items Addressed This Visit       Cardiovascular and Mediastinum   HTN (hypertension) (Chronic)    BP Readings from Last 1 Encounters:  02/16/23 107/71  Well-controlled with Enalapril, Chlorthalidone and Metoprolol Counseled for compliance with the medications Advised DASH diet and moderate exercise/walking, at least 150 mins/week      Relevant Orders   TSH   CMP14+EGFR   CBC with Differential/Platelet   Varicose veins of both lower extremities    Could be due to prolonged standing Advised to perform leg elevation and compression stocking Has been evaluated by vascular surgery        Endocrine   Type 2 diabetes mellitus with morbid obesity ([place])    Lab Results  Component Value Date   HGBA1C 6.0 (H) 08/16/2022  Well controlled Contunue Mounjaro for additional weight loss benefit, 5 mg now - if recurrent abdominal pain or nausea, will DC Mounjaro Advised to follow diabetic diet On ACEi and statin F/u CMP and  lipid panel Diabetic eye exam: Advised to follow up with Ophthalmology for diabetic eye exam      Relevant Orders   Hemoglobin A1c   CMP14+EGFR     Musculoskeletal and Integument   Venous stasis dermatitis    Advised to keep legs elevated when possible Betamethasone cream for itching/irritation      Relevant Medications   betamethasone dipropionate 0.05 % cream     Other   Morbid obesity (HCC)    BMI Readings from Last 2 Encounters:  02/16/23 42.41 kg/m  01/25/23 43.76 kg/m   Diet modification and moderate exercise advised Continue Mounjaro for DM and obesity      Mixed hyperlipidemia    On Crestor Check lipid profile      Relevant Orders   Lipid panel   Encounter for general adult medical examination with abnormal findings - Primary    Physical exam as documented. Fasting blood tests ordered today.      Vitamin D deficiency    Advised to take vitamin D 5000 IU daily      Relevant Orders   VITAMIN D 25 Hydroxy (Vit-D Deficiency, Fractures)   Screening for prostate cancer    Ordered PSA after discussing its limitations for prostate cancer screening, including false positive results leading to additional investigations.      Relevant Orders   PSA   Meds ordered this encounter  Medications   betamethasone dipropionate 0.05 % cream    Sig: Apply topically 2 (two) times daily.    Dispense:  30 g    Refill:  2    Follow-up: Return in about 4 months (around 06/18/2023) for DM and HTN.    Lindell Spar, MD

## 2023-02-16 NOTE — Assessment & Plan Note (Signed)
BMI Readings from Last 2 Encounters:  02/16/23 42.41 kg/m  01/25/23 43.76 kg/m   Diet modification and moderate exercise advised Continue Mounjaro for DM and obesity

## 2023-02-17 LAB — CBC WITH DIFFERENTIAL/PLATELET
Basophils Absolute: 0 10*3/uL (ref 0.0–0.2)
Basos: 1 %
EOS (ABSOLUTE): 0.1 10*3/uL (ref 0.0–0.4)
Eos: 2 %
Hematocrit: 45.7 % (ref 37.5–51.0)
Hemoglobin: 15.2 g/dL (ref 13.0–17.7)
Immature Grans (Abs): 0 10*3/uL (ref 0.0–0.1)
Immature Granulocytes: 0 %
Lymphocytes Absolute: 1.6 10*3/uL (ref 0.7–3.1)
Lymphs: 27 %
MCH: 26.7 pg (ref 26.6–33.0)
MCHC: 33.3 g/dL (ref 31.5–35.7)
MCV: 80 fL (ref 79–97)
Monocytes Absolute: 0.5 10*3/uL (ref 0.1–0.9)
Monocytes: 9 %
Neutrophils Absolute: 3.8 10*3/uL (ref 1.4–7.0)
Neutrophils: 61 %
Platelets: 206 10*3/uL (ref 150–450)
RBC: 5.7 x10E6/uL (ref 4.14–5.80)
RDW: 14 % (ref 11.6–15.4)
WBC: 6.1 10*3/uL (ref 3.4–10.8)

## 2023-02-17 LAB — HEMOGLOBIN A1C
Est. average glucose Bld gHb Est-mCnc: 128 mg/dL
Hgb A1c MFr Bld: 6.1 % — ABNORMAL HIGH (ref 4.8–5.6)

## 2023-02-17 LAB — CMP14+EGFR
ALT: 19 IU/L (ref 0–44)
AST: 23 IU/L (ref 0–40)
Albumin/Globulin Ratio: 1.5 (ref 1.2–2.2)
Albumin: 4.5 g/dL (ref 3.8–4.9)
Alkaline Phosphatase: 71 IU/L (ref 44–121)
BUN/Creatinine Ratio: 17 (ref 9–20)
BUN: 19 mg/dL (ref 6–24)
Bilirubin Total: 0.5 mg/dL (ref 0.0–1.2)
CO2: 24 mmol/L (ref 20–29)
Calcium: 10.2 mg/dL (ref 8.7–10.2)
Chloride: 101 mmol/L (ref 96–106)
Creatinine, Ser: 1.12 mg/dL (ref 0.76–1.27)
Globulin, Total: 3 g/dL (ref 1.5–4.5)
Glucose: 95 mg/dL (ref 70–99)
Potassium: 4.4 mmol/L (ref 3.5–5.2)
Sodium: 139 mmol/L (ref 134–144)
Total Protein: 7.5 g/dL (ref 6.0–8.5)
eGFR: 79 mL/min/{1.73_m2} (ref >59)

## 2023-02-17 LAB — LIPID PANEL
Chol/HDL Ratio: 2.7 ratio (ref 0.0–5.0)
Cholesterol, Total: 115 mg/dL (ref 100–199)
HDL: 43 mg/dL (ref >39)
LDL Chol Calc (NIH): 58 mg/dL (ref 0–99)
Triglycerides: 62 mg/dL (ref 0–149)
VLDL Cholesterol Cal: 14 mg/dL (ref 5–40)

## 2023-02-17 LAB — TSH: TSH: 1.64 u[IU]/mL (ref 0.450–4.500)

## 2023-02-17 LAB — PSA: Prostate Specific Ag, Serum: 0.1 ng/mL (ref 0.0–4.0)

## 2023-02-17 LAB — VITAMIN D 25 HYDROXY (VIT D DEFICIENCY, FRACTURES): Vit D, 25-Hydroxy: 19.5 ng/mL — ABNORMAL LOW (ref 30.0–100.0)

## 2023-02-21 ENCOUNTER — Telehealth: Payer: Self-pay

## 2023-02-21 NOTE — Telephone Encounter (Signed)
Patient left message about checking on papers he dropped off yesterday.  I returned his call and had to leave him a message to call us back.

## 2023-02-22 ENCOUNTER — Telehealth: Payer: Self-pay | Admitting: Internal Medicine

## 2023-02-22 ENCOUNTER — Other Ambulatory Visit: Payer: Self-pay | Admitting: Internal Medicine

## 2023-02-22 MED ORDER — METFORMIN HCL 500 MG PO TABS: 500.0000 mg | ORAL_TABLET | Freq: Two times a day (BID) | ORAL | 2 refills | Status: DC

## 2023-02-22 NOTE — Telephone Encounter (Signed)
Pt states phar is out of tirzepatide [facility]) 5 MG/0.5ML Pen & it is on back order. Wants to know what he needs to do?

## 2023-02-22 NOTE — Telephone Encounter (Signed)
Left message

## 2023-02-23 ENCOUNTER — Other Ambulatory Visit: Payer: Self-pay

## 2023-02-23 ENCOUNTER — Other Ambulatory Visit (HOSPITAL_COMMUNITY): Payer: Self-pay

## 2023-02-23 ENCOUNTER — Telehealth: Payer: Self-pay | Admitting: Internal Medicine

## 2023-02-23 DIAGNOSIS — E1169 Type 2 diabetes mellitus with other specified complication: Secondary | ICD-10-CM

## 2023-02-23 MED ORDER — MOUNJARO 5 MG/0.5ML ~~LOC~~ SOAJ
5.0000 mg | SUBCUTANEOUS | 1 refills | Status: DC
  Filled 2023-02-23: qty 2, 28d supply, fill #0
  Filled 2023-03-20: qty 2, 28d supply, fill #1

## 2023-02-23 NOTE — Telephone Encounter (Signed)
Sent mounjaro to [facility] and let patient know.

## 2023-02-23 NOTE — Telephone Encounter (Signed)
Pt called stating the metformin that was called in makes him sick. [facility] is out of stock & does not know when it will be back in stock. Wants to know what else he can take?

## 2023-02-26 ENCOUNTER — Encounter: Payer: Self-pay | Admitting: Orthopedic Surgery

## 2023-02-26 ENCOUNTER — Ambulatory Visit (INDEPENDENT_AMBULATORY_CARE_PROVIDER_SITE_OTHER): Payer: BC Managed Care – PPO | Admitting: Orthopedic Surgery

## 2023-02-26 DIAGNOSIS — M1712 Unilateral primary osteoarthritis, left knee: Secondary | ICD-10-CM

## 2023-02-26 DIAGNOSIS — G8929 Other chronic pain: Secondary | ICD-10-CM

## 2023-02-26 MED ORDER — SODIUM HYALURONATE (VISCOSUP) 25 MG/2.5ML IX SOSY
25.0000 mg | PREFILLED_SYRINGE | INTRA_ARTICULAR | Status: AC
  Administered 2023-02-26 – 2023-03-12 (×3): 25 mg via INTRA_ARTICULAR

## 2023-02-26 NOTE — Progress Notes (Signed)
The patient has consented to and requested hyaluronic acid injection in the form of  Chief Complaint  Patient presents with   Injections    FIRST SUPARTZ LEFT KNEE       The left  knee is prepped with alcohol and ethyl chloride  The injection is performed with a 21-gauge needle, via inferolateral approach  No complications were noted  Appropriate instructions post injection were given

## 2023-03-05 ENCOUNTER — Ambulatory Visit (INDEPENDENT_AMBULATORY_CARE_PROVIDER_SITE_OTHER): Payer: BC Managed Care – PPO | Admitting: Orthopedic Surgery

## 2023-03-05 DIAGNOSIS — M23321 Other meniscus derangements, posterior horn of medial meniscus, right knee: Secondary | ICD-10-CM

## 2023-03-05 DIAGNOSIS — M1712 Unilateral primary osteoarthritis, left knee: Secondary | ICD-10-CM

## 2023-03-05 DIAGNOSIS — Z9889 Other specified postprocedural states: Secondary | ICD-10-CM

## 2023-03-05 DIAGNOSIS — M238X2 Other internal derangements of left knee: Secondary | ICD-10-CM

## 2023-03-05 DIAGNOSIS — G8929 Other chronic pain: Secondary | ICD-10-CM

## 2023-03-05 DIAGNOSIS — Z6841 Body Mass Index (BMI) 40.0 and over, adult: Secondary | ICD-10-CM

## 2023-03-05 MED ORDER — SODIUM HYALURONATE (VISCOSUP) 25 MG/2.5ML IX SOSY: 2.5000 mL | PREFILLED_SYRINGE | Freq: Once | INTRA_ARTICULAR | Status: AC

## 2023-03-05 NOTE — Progress Notes (Signed)
Chief Complaint  Patient presents with   Injections    Left -Supartz 2 of 3    The patient has consented to and requested hyaluronic acid injection in the form of  SUPARTZ   The left knee is prepped with alcohol and ethyl chloride  The injection is performed with a 21-gauge needle, via inferolateral approach  No complications were noted  Appropriate instructions post injection were given

## 2023-03-05 NOTE — Addendum Note (Signed)
Addended by: Debby Bud R on: 03/05/2023 05:41 PM   Modules accepted: Orders

## 2023-03-12 ENCOUNTER — Ambulatory Visit (INDEPENDENT_AMBULATORY_CARE_PROVIDER_SITE_OTHER): Payer: BC Managed Care – PPO | Admitting: Orthopedic Surgery

## 2023-03-12 ENCOUNTER — Encounter: Payer: Self-pay | Admitting: Orthopedic Surgery

## 2023-03-12 DIAGNOSIS — M1712 Unilateral primary osteoarthritis, left knee: Secondary | ICD-10-CM | POA: Diagnosis not present

## 2023-03-12 MED ORDER — SODIUM HYALURONATE (VISCOSUP) 25 MG/2.5ML IX SOSY: PREFILLED_SYRINGE | Freq: Once | INTRA_ARTICULAR | Status: AC

## 2023-03-12 NOTE — Progress Notes (Signed)
Chief Complaint  Patient presents with   Injections    Left knee supartz #3    Supartz injection left knee  The knee looks amenable to injection  Preparation Alcohol Ethyl chloride 20-gauge needle Lateral approach left knee  Left knee injected with Supartz  No complications follow-up 6 to 8 weeks

## 2023-03-15 DIAGNOSIS — E119 Type 2 diabetes mellitus without complications: Secondary | ICD-10-CM | POA: Diagnosis not present

## 2023-03-15 DIAGNOSIS — H1045 Other chronic allergic conjunctivitis: Secondary | ICD-10-CM | POA: Diagnosis not present

## 2023-03-15 DIAGNOSIS — Z7984 Long term (current) use of oral hypoglycemic drugs: Secondary | ICD-10-CM | POA: Diagnosis not present

## 2023-03-15 LAB — HM DIABETES EYE EXAM

## 2023-03-20 ENCOUNTER — Telehealth: Payer: Self-pay | Admitting: Internal Medicine

## 2023-03-20 ENCOUNTER — Other Ambulatory Visit: Payer: Self-pay | Admitting: Internal Medicine

## 2023-03-20 ENCOUNTER — Other Ambulatory Visit: Payer: Self-pay

## 2023-03-20 DIAGNOSIS — E1169 Type 2 diabetes mellitus with other specified complication: Secondary | ICD-10-CM

## 2023-03-20 MED ORDER — SEMAGLUTIDE (1 MG/DOSE) 4 MG/3ML ~~LOC~~ SOPN: 1.0000 mg | PEN_INJECTOR | SUBCUTANEOUS | 3 refills | Status: DC

## 2023-03-20 NOTE — Telephone Encounter (Signed)
Left message for patient

## 2023-03-20 NOTE — Telephone Encounter (Signed)
Pt called in regard to [facility]. Pt sates that med is out of stock , unable to find pharm with med in stock.  Pt also sates that he cannot take the Metformin.  Wants a call back.

## 2023-04-30 ENCOUNTER — Ambulatory Visit: Payer: BC Managed Care – PPO | Admitting: Orthopedic Surgery

## 2023-04-30 ENCOUNTER — Encounter: Payer: Self-pay | Admitting: Orthopedic Surgery

## 2023-04-30 VITALS — BP 132/71 | HR 92 | Ht 77.0 in | Wt 369.0 lb

## 2023-04-30 DIAGNOSIS — M238X2 Other internal derangements of left knee: Secondary | ICD-10-CM | POA: Diagnosis not present

## 2023-04-30 DIAGNOSIS — G8929 Other chronic pain: Secondary | ICD-10-CM

## 2023-04-30 DIAGNOSIS — M23321 Other meniscus derangements, posterior horn of medial meniscus, right knee: Secondary | ICD-10-CM | POA: Diagnosis not present

## 2023-04-30 DIAGNOSIS — M1712 Unilateral primary osteoarthritis, left knee: Secondary | ICD-10-CM

## 2023-04-30 DIAGNOSIS — Z6841 Body Mass Index (BMI) 40.0 and over, adult: Secondary | ICD-10-CM

## 2023-04-30 DIAGNOSIS — Z9889 Other specified postprocedural states: Secondary | ICD-10-CM | POA: Diagnosis not present

## 2023-04-30 NOTE — Progress Notes (Signed)
No chief complaint on file.   Encounter Diagnoses  Name Primary?   Primary osteoarthritis of left knee Yes   Chronic pain of left knee    S/P arthroscopy of left knee 05/17/21    Chondral defect of condyle of left femur    Derangement of posterior horn of medial meniscus of right knee    Body mass index 40.0-44.9, adult (HCC)     James Rivas finished his third injection with Supartz on 03/12/2023  His symptoms now include: Less pain and less stiffness  He is allopurinol  James Rivas is doing well with his Supartz injections he said at first he did not think it worked but then it kicked in  I recommend he do this again in 6 months

## 2023-05-01 ENCOUNTER — Other Ambulatory Visit: Payer: Self-pay | Admitting: Orthopaedic Surgery

## 2023-05-08 ENCOUNTER — Telehealth: Payer: Self-pay

## 2023-05-08 NOTE — Telephone Encounter (Signed)
-----   Message from [facility] May, RT sent at 05/08/2023  2:25 PM EDT ----- Left knee ----- Message ----- From: Vickki Hearing, MD Sent: 04/30/2023   4:32 PM EDT To: Cherre Huger, RT  Can we please approve him for Supartz in 6 months

## 2023-05-08 NOTE — Telephone Encounter (Signed)
Noted.  Will submit in September, 2024 for Supartz, left knee.  Next available gel injection needs to be after 09/11/2023.

## 2023-05-14 ENCOUNTER — Other Ambulatory Visit: Payer: Self-pay | Admitting: Internal Medicine

## 2023-05-14 DIAGNOSIS — I10 Essential (primary) hypertension: Secondary | ICD-10-CM

## 2023-05-15 ENCOUNTER — Other Ambulatory Visit: Payer: Self-pay | Admitting: Internal Medicine

## 2023-05-15 DIAGNOSIS — E1169 Type 2 diabetes mellitus with other specified complication: Secondary | ICD-10-CM

## 2023-05-23 NOTE — Progress Notes (Signed)
CARDIOLOGY CONSULT NOTE       Patient ID: James Rivas MRN: [id number] DOB/AGE: 53-17-1972 53 y.o.  Admit date: (Not on file) Referring Physician: Ladona Rivas Primary Physician: James Halon, MD Primary Cardiologist: James Rivas Reason for Consultation: PVC abnormal ECG    HPI:  52 y.o. previously seen by James Rivas for palpitations. Monitor with PVCls 4 beat NSVT Rx with beta blocker with improvement Normal echo September 2019 reviewed. History of HTN Rx with beta blocker , ACE And diuretic. Morbid obesity Most recent ECG 11/25/20 with SR poor R wave progression nonspecific inferior ST changes. 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 [employer] Very busy He likes to root for [employer] And [employer] hours at work last 29 years are 3pm to 3 am which is tough  Activity limited by weight and gout in left knee Has had scope June 2022 with meniscus repair  On allopurinol Sees James Rivas Has had 3 injections of Supartz  No chest pain Some dependant edema When I first saw May 2022 recommended calcium score to risk stratify for CAD but he deferred   Has chronic LE venous varicosities seen by [facility] James Rivas recommend compression stockings Did mention laser ablation left greater saphenous vein with stab phlebectomies   Calcium score August 2023 was 0   No cardiac complaints   ROS All other systems reviewed and negative except as noted above  Past Medical History:  Diagnosis Date   Allergy    Arthritis    left knee   Chest pain    Chronic chest pain 06/22/2013   Diabetes mellitus without complication (HCC)    Gout    Gout    History of COVID-19 12/20/2020   HTN (hypertension)    Impaired fasting glucose    Morbid obesity (HCC)    Sleep apnea    uses a CPAP   Venous stasis     Family History   Problem Relation Age of Onset   Diabetes Father    Diabetes Paternal Aunt    Colon cancer Neg Hx     Social History   Socioeconomic History   Marital status: Married    Spouse name: Not on file   Number of children: 3   Years of education: Not on file   Highest education level: Not on file  Occupational History   Occupation: [employer]    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 1 beer or a glass of wine a couple times a week.    Drug use: No   Sexual activity: Never  Other Topics Concern   Not on file  Social History Narrative   Not on file   Social Determinants of Health   Financial Resource Strain: Not on file  Food Insecurity: Not on file  Transportation Needs: Not on file  Physical Activity: Not on file  Stress: Not on file  Social Connections: Not on file  Intimate Partner Violence: Not on file    Past Surgical History:  Procedure Laterality Date   COLONOSCOPY WITH PROPOFOL N/A 05/11/2020  Procedure: COLONOSCOPY WITH PROPOFOL;  Surgeon: James Ade, MD;  [facility];  Service: Endoscopy;  Laterality: N/A;  2:15pm   FINGER SURGERY Left    left ring finger 2 surgeries   I & D EXTREMITY  11/29/2011   Procedure: IRRIGATION AND DEBRIDEMENT EXTREMITY;  Surgeon: James Rivas;  Location: [facility];  Service: Orthopedics;  Laterality: Left;  I&D Right Long and Index Fingers with Tendon Repair as Needed   KNEE ARTHROSCOPY WITH MEDIAL MENISECTOMY Left 05/17/2021   Procedure: KNEE ARTHROSCOPY WITH MICROFRACTURE;  Surgeon: James Hearing, MD;  Location: [facility];  Service: Orthopedics;  Laterality: Left;   MENISECTOMY Left 05/17/2021   Procedure: PARTIAL MEDIAL MENISECTOMY;  Surgeon: James Hearing, MD;  Location: [facility];  Service: Orthopedics;  Laterality: Left;      Current  Outpatient Medications:    Accu-Chek Softclix Lancets lancets, Use as instructed, Disp: 100 each, Rfl: 12   allopurinol ([employer]) 300 MG tablet, TAKE 1 TABLET(300 MG) BY MOUTH DAILY, Disp: 30 tablet, Rfl: 5   blood glucose meter kit and supplies, Dispense based on patient and insurance preference. Use daily to check blood sugar.. (FOR ICD-10 E10.9, E11.9)., Disp: 1 each, Rfl: 0   chlorthalidone (HYGROTON) 25 MG tablet, TAKE 1 TABLET(25 MG) BY MOUTH DAILY, Disp: 90 tablet, Rfl: 1   enalapril (VASOTEC) 20 MG tablet, TAKE 1 TABLET(20 MG) BY MOUTH DAILY, Disp: 90 tablet, Rfl: 1   glucose blood (ONETOUCH VERIO) test strip, USE TO CHECK BLOOD SUGAR DAILY, Disp: 100 strip, Rfl: 1   HYDROcodone-acetaminophen (NORCO) 5-325 MG tablet, Take 1-2 tablets by mouth every 6 (six) hours as needed., Disp: 10 tablet, Rfl: 0   ibuprofen (ADVIL) 600 MG tablet, Take 1 tablet (600 mg total) by mouth every 8 (eight) hours as needed., Disp: 30 tablet, Rfl: 0   loperamide (IMODIUM) 2 MG capsule, Take 1 capsule (2 mg total) by mouth 4 (four) times daily as needed for diarrhea or loose stools., Disp: 12 capsule, Rfl: 0   metFORMIN (GLUCOPHAGE) 500 MG tablet, Take 1 tablet (500 mg total) by mouth 2 (two) times daily with a meal., Disp: 60 tablet, Rfl: 2   metoprolol tartrate (LOPRESSOR) 25 MG tablet, TAKE 1/2 TABLET(12.5 MG) BY MOUTH TWICE DAILY, Disp: 90 tablet, Rfl: 1   MOUNJARO 5 MG/0.5ML Pen, ADMINISTER 5 MG UNDER THE SKIN 1 TIME A WEEK, Disp: 2 mL, Rfl: 2   omeprazole (PRILOSEC) 20 MG capsule, Take 1 capsule (20 mg total) by mouth daily., Disp: 30 capsule, Rfl: 5   ondansetron (ZOFRAN-ODT) 8 MG disintegrating tablet, 8mg  ODT q4 hours prn nausea, Disp: 10 tablet, Rfl: 0   rosuvastatin (CRESTOR) 10 MG tablet, TAKE 1 TABLET(10 MG) BY MOUTH DAILY, Disp: 90 tablet, Rfl: 3  Current Facility-Administered Medications:    Sodium Hyaluronate (Viscosup) SOSY 2.5 mL, 2.5 mL, Intra-articular, Once, James Hearing, MD   Sodium  Hyaluronate (Viscosup) SOSY, , Intra-articular, Once, James Hearing, MD    Physical Exam: Blood pressure 128/70, pulse 87, height 6\' 5"  (1.956 m), weight (!) 368 lb 12.8 oz (167.3 kg), SpO2 95 %.    Affect appropriate Overweight black male  HEENT: normal Neck supple with no adenopathy JVP normal no bruits no thyromegaly Lungs clear with no wheezing and good diaphragmatic motion Heart:  S1/S2 no murmur, no rub, gallop or click PMI normal Abdomen: benighn, BS positve, no tenderness, no AAA no bruit.  No HSM or HJR Distal pulses intact with no bruits  Bilateral varicosities with chronic stasis (hyperpigmentation and lipo dermatosclerosis )  Neuro non-focal Skin warm and dry No muscular weakness   Labs:   Lab Results  Component Value Date   WBC 6.1 02/16/2023   HGB 15.2 02/16/2023   HCT 45.7 02/16/2023   MCV 80 02/16/2023   PLT 206 02/16/2023   No results for input(s): "NA", "K", "CL", "CO2", "BUN", "CREATININE", "CALCIUM", "PROT", "BILITOT", "ALKPHOS", "ALT", "AST", "GLUCOSE" in the last 168 hours.  Invalid input(s): "LABALBU" Lab Results  Component Value Date   TROPONINI <0.03 06/14/2018    Lab Results  Component Value Date   CHOL 115 02/16/2023   CHOL 119 02/13/2022   CHOL 196 06/20/2021   Lab Results  Component Value Date   HDL 43 02/16/2023   HDL 44 02/13/2022   HDL 41 06/20/2021   Lab Results  Component Value Date   LDLCALC 58 02/16/2023   LDLCALC 59 02/13/2022   LDLCALC 133 (H) 06/20/2021   Lab Results  Component Value Date   TRIG 62 02/16/2023   TRIG 80 02/13/2022   TRIG 122 06/20/2021   Lab Results  Component Value Date   CHOLHDL 2.7 02/16/2023   CHOLHDL 2.7 02/13/2022   CHOLHDL 4.8 02/11/2021   No results found for: "LDLDIRECT"    Radiology: No results found.  EKG: see HPI  06/05/2023 SR rate 98 PAC low voltage    ASSESSMENT AND PLAN:   1. PVC:  Benign appearing Rx with beta blocker previous echo with no structural heart  disease. Coronary calcium score 0 07/07/22  2. HTN:  Well controlled.  Continue current medications and low sodium Dash type diet.   3. Obesity: discussed diet, exercise possible bariatric surgery   4. Gout :  Continue allopurinol and PRN colchicine for flare  5. Venous Varicosities:  F/U [facility] compression stockings consider laser ablation of left > saphenous vein if worsens  6. DM:  Discussed low carb diet.  Target hemoglobin A1c is 6.5 or less.  Continue current medications. 7:  OSA:  CPAP and weight loss   F/U in a year   Signed: Charlton Haws 06/05/2023, 3:05 PM

## 2023-06-05 ENCOUNTER — Encounter: Payer: Self-pay | Admitting: Cardiovascular Disease

## 2023-06-05 ENCOUNTER — Ambulatory Visit: Payer: BC Managed Care – PPO | Attending: Cardiovascular Disease | Admitting: Cardiovascular Disease

## 2023-06-05 VITALS — BP 128/70 | HR 87 | Ht 77.0 in | Wt 368.8 lb

## 2023-06-05 DIAGNOSIS — I10 Essential (primary) hypertension: Secondary | ICD-10-CM | POA: Diagnosis not present

## 2023-06-05 DIAGNOSIS — I8393 Asymptomatic varicose veins of bilateral lower extremities: Secondary | ICD-10-CM

## 2023-06-05 DIAGNOSIS — I493 Ventricular premature depolarization: Secondary | ICD-10-CM

## 2023-06-05 NOTE — Patient Instructions (Signed)
Medication Instructions:  Your physician recommends that you continue on your current medications as directed. Please refer to the Current Medication list given to you today.  *If you need a refill on your cardiac medications before your next appointment, please call your pharmacy*   Lab Work: None If you have labs (blood work) drawn today and your tests are completely normal, you will receive your results only by: MyChart Message (if you have MyChart) OR A paper copy in the mail If you have any lab test that is abnormal or we need to change your treatment, we will call you to review the results.   Testing/Procedures: None   Follow-Up: At [facility], you and your health needs are our priority.  As part of our continuing mission to provide you with exceptional heart care, we have created designated Provider Care Teams.  These Care Teams include your primary Cardiologist (physician) and Advanced Practice Providers (APPs -  Physician Assistants and Nurse Practitioners) who all work together to provide you with the care you need, when you need it.  We recommend signing up for the patient portal called "MyChart".  Sign up information is provided on this After Visit Summary.  MyChart is used to connect with patients for Virtual Visits (Telemedicine).  Patients are able to view lab/test results, encounter notes, upcoming appointments, etc.  Non-urgent messages can be sent to your provider as well.   To learn more about what you can do with MyChart, go to [email or web address].    Your next appointment:   1 year(s)  Provider:   Peter Nishan, MD    Other Instructions    

## 2023-06-22 ENCOUNTER — Encounter: Payer: Self-pay | Admitting: Internal Medicine

## 2023-06-22 ENCOUNTER — Ambulatory Visit (INDEPENDENT_AMBULATORY_CARE_PROVIDER_SITE_OTHER): Payer: BC Managed Care – PPO | Admitting: Internal Medicine

## 2023-06-22 VITALS — BP 120/72 | HR 71 | Ht 77.0 in | Wt 364.0 lb

## 2023-06-22 DIAGNOSIS — G4733 Obstructive sleep apnea (adult) (pediatric): Secondary | ICD-10-CM

## 2023-06-22 DIAGNOSIS — M545 Low back pain, unspecified: Secondary | ICD-10-CM | POA: Diagnosis not present

## 2023-06-22 DIAGNOSIS — E1169 Type 2 diabetes mellitus with other specified complication: Secondary | ICD-10-CM

## 2023-06-22 DIAGNOSIS — G8929 Other chronic pain: Secondary | ICD-10-CM

## 2023-06-22 DIAGNOSIS — I10 Essential (primary) hypertension: Secondary | ICD-10-CM | POA: Diagnosis not present

## 2023-06-22 DIAGNOSIS — I493 Ventricular premature depolarization: Secondary | ICD-10-CM

## 2023-06-22 DIAGNOSIS — E782 Mixed hyperlipidemia: Secondary | ICD-10-CM

## 2023-06-22 LAB — POCT GLYCOSYLATED HEMOGLOBIN (HGB A1C): HbA1c, POC (controlled diabetic range): 5.6 % (ref 0.0–7.0)

## 2023-06-22 MED ORDER — CYCLOBENZAPRINE HCL 5 MG PO TABS: 5.0000 mg | ORAL_TABLET | Freq: Two times a day (BID) | ORAL | 1 refills | Status: DC | PRN

## 2023-06-22 MED ORDER — ROSUVASTATIN CALCIUM 10 MG PO TABS: 10.0000 mg | ORAL_TABLET | Freq: Every day | ORAL | 3 refills | Status: DC

## 2023-06-22 MED ORDER — ENALAPRIL MALEATE 20 MG PO TABS: 20.0000 mg | ORAL_TABLET | Freq: Every day | ORAL | 1 refills | Status: DC

## 2023-06-22 MED ORDER — METOPROLOL TARTRATE 25 MG PO TABS: 12.5000 mg | ORAL_TABLET | Freq: Two times a day (BID) | ORAL | 1 refills | Status: DC

## 2023-06-22 MED ORDER — TIRZEPATIDE 7.5 MG/0.5ML ~~LOC~~ SOAJ: 7.5000 mg | SUBCUTANEOUS | 0 refills | Status: DC

## 2023-06-22 NOTE — Assessment & Plan Note (Addendum)
Lab Results  Component Value Date   HGBA1C 6.1 (H) 02/16/2023   Well controlled Contunue Mounjaro for additional weight loss benefit,  increased dose to 7.5 mg now - plan to increase to 10 mg qw Advised to follow diabetic diet On ACEi and statin F/u CMP and lipid panel Diabetic eye exam: Advised to follow up with Ophthalmology for diabetic eye exam

## 2023-06-22 NOTE — Assessment & Plan Note (Signed)
BMI Readings from Last 2 Encounters:  06/22/23 43.16 kg/m  06/05/23 43.73 kg/m   Diet modification and moderate exercise advised Continue Mounjaro for DM and obesity

## 2023-06-22 NOTE — Assessment & Plan Note (Signed)
Uses CPAP regularly °

## 2023-06-22 NOTE — Assessment & Plan Note (Signed)
Followed by cardiology On metoprolol 12.5 mg BID

## 2023-06-22 NOTE — Patient Instructions (Addendum)
Please take Flexeril as needed for back muscle spasms.  Please avoid heavy lifting and frequent bending.  Okay to apply heating pad and/or back brace. Okay to apply Lidocaine patch for back pain.  Please start taking Mounjaro 7.5 mg once you complete 5 mg dose.  Please continue to follow low carb diet and perform moderate exercise/walking at least 150 mins/week.

## 2023-06-22 NOTE — Assessment & Plan Note (Addendum)
Likely due to strenuous work in the heat Has to lie flat to use CPAP device, may cause paraspinal muscle stiffness Simple back  exercises advised Flexeril as needed for back muscle spasms Heating pad and/or back brace as needed Can use lidocaine patch Avoid heavy lifting and frequent bending Check x-ray lumbar spine

## 2023-06-22 NOTE — Assessment & Plan Note (Signed)
On Crestor Check lipid profile 

## 2023-06-22 NOTE — Assessment & Plan Note (Signed)
BP Readings from Last 1 Encounters:  06/22/23 120/72   Well-controlled with Enalapril, Chlorthalidone and Metoprolol Counseled for compliance with the medications Advised DASH diet and moderate exercise/walking, at least 150 mins/week

## 2023-06-22 NOTE — Progress Notes (Signed)
Established Patient Office Visit  Subjective:  Patient ID: James Rivas, male    DOB: 04/16/1971  Age: 52 y.o. MRN: [id number]  CC:  Chief Complaint  Patient presents with   Diabetes    Four month follow up    Hypertension    Four month follow up    Back Pain    Patient states he is having back pain and spasms     HPI James Rivas is a 52 y.o. male with past medical history of HTN, type II DM with morbid obesity, OSA, gout and HLD who presents for f/u of his chronic medical conditions.  Type II DM: He is  tolerating Mounjaro 5 mg qw. Reports that he has struggled with controlling his food intake at times. He is trying to follow small, frequent meals.  He has gained about 7 lbs from the last visit and attributes it to not being able to exercise regularly due to excessive heat.  He denies any polyuria or polyphagia currently.  Denies any recurrent episode of abdominal pain, nausea or vomiting since the last visit.  HTN: BP is well-controlled. Takes medications regularly. Patient denies headache, dizziness, chest pain, dyspnea or palpitations.  He reports acute on chronic low back pain, worse for the last 2 weeks.  Denies any recent injury, heavy lifting or falls.  He has ibuprofen, but does not take it regularly due to acid reflux, which I agreed to.  Denies any numbness or tingling of the LE.   Past Medical History:  Diagnosis Date   Allergy    Arthritis    left knee   Chest pain    Chronic chest pain 06/22/2013   Diabetes mellitus without complication (HCC)    Gout    Gout    History of COVID-19 12/20/2020   HTN (hypertension)    Impaired fasting glucose    Morbid obesity (HCC)    Sleep apnea    uses a CPAP   Venous stasis     Past Surgical History:  Procedure Laterality Date   COLONOSCOPY WITH PROPOFOL N/A 05/11/2020   Procedure: COLONOSCOPY WITH PROPOFOL;  Surgeon: Corbin Ade, MD;  Location: [facility];  Service: Endoscopy;  Laterality: N/A;  2:15pm   FINGER  SURGERY Left    left ring finger 2 surgeries   I & D EXTREMITY  11/29/2011   Procedure: IRRIGATION AND DEBRIDEMENT EXTREMITY;  Surgeon: Sharma Covert;  Location: [facility] OR;  Service: Orthopedics;  Laterality: Left;  I&D Right Long and Index Fingers with Tendon Repair as Needed   KNEE ARTHROSCOPY WITH MEDIAL MENISECTOMY Left 05/17/2021   Procedure: KNEE ARTHROSCOPY WITH MICROFRACTURE;  Surgeon: Vickki Hearing, MD;  Location: [facility];  Service: Orthopedics;  Laterality: Left;   MENISECTOMY Left 05/17/2021   Procedure: PARTIAL MEDIAL MENISECTOMY;  Surgeon: Vickki Hearing, MD;  Location: [facility];  Service: Orthopedics;  Laterality: Left;    Family History  Problem Relation Age of Onset   Diabetes Father    Diabetes Paternal Aunt    Colon cancer Neg Hx     Social History   Socioeconomic History   Marital status: Married    Spouse name: Not on file   Number of children: 3   Years of education: Not on file   Highest education level: Not on file  Occupational History   Occupation: [employer]    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 1 beer or a glass of wine a couple times a week.    Drug use: No   Sexual activity: Never  Other Topics Concern   Not on file  Social History Narrative   Not on file   Social Determinants of Health   Financial Resource Strain: Not on file  Food Insecurity: Not on file  Transportation Needs: Not on file  Physical Activity: Not on file  Stress: Not on file  Social Connections: Unknown (04/11/2022)   Received from [facility]   Social Network    Social Network: Not on file  Intimate Partner Violence: Unknown (03/03/2022)   Received from [facility]   HITS    Physically Hurt: Not on file    Insult or Talk Down To: Not on file    Threaten Physical Harm: Not on file     Scream or Curse: Not on file    Outpatient Medications Prior to Visit  Medication Sig Dispense Refill   Accu-Chek Softclix Lancets lancets Use as instructed 100 each 12   allopurinol (ZYLOPRIM) 300 MG tablet TAKE 1 TABLET(300 MG) BY MOUTH DAILY 30 tablet 5   blood glucose meter kit and supplies Dispense based on patient and insurance preference. Use daily to check blood sugar.. (FOR ICD-10 E10.9, E11.9). 1 each 0   chlorthalidone (HYGROTON) 25 MG tablet TAKE 1 TABLET(25 MG) BY MOUTH DAILY 90 tablet 1   glucose blood (ONETOUCH VERIO) test strip USE TO CHECK BLOOD SUGAR DAILY 100 strip 1   HYDROcodone-acetaminophen (NORCO) 5-325 MG tablet Take 1-2 tablets by mouth every 6 (six) hours as needed. 10 tablet 0   ibuprofen (ADVIL) 600 MG tablet Take 1 tablet (600 mg total) by mouth every 8 (eight) hours as needed. 30 tablet 0   loperamide (IMODIUM) 2 MG capsule Take 1 capsule (2 mg total) by mouth 4 (four) times daily as needed for diarrhea or loose stools. 12 capsule 0   omeprazole (PRILOSEC) 20 MG capsule Take 1 capsule (20 mg total) by mouth daily. 30 capsule 5   ondansetron (ZOFRAN-ODT) 8 MG disintegrating tablet 8mg  ODT q4 hours prn nausea 10 tablet 0   enalapril (VASOTEC) 20 MG tablet TAKE 1 TABLET(20 MG) BY MOUTH DAILY 90 tablet 1   metFORMIN (GLUCOPHAGE) 500 MG tablet Take 1 tablet (500 mg total) by mouth 2 (two) times daily with a meal. 60 tablet 2   metoprolol tartrate (LOPRESSOR) 25 MG tablet TAKE 1/2 TABLET(12.5 MG) BY MOUTH TWICE DAILY 90 tablet 1   MOUNJARO 5 MG/0.5ML Pen ADMINISTER 5 MG UNDER THE SKIN 1 TIME A WEEK 2 mL 2   rosuvastatin (CRESTOR) 10 MG tablet TAKE 1 TABLET(10 MG) BY MOUTH DAILY 90 tablet 3   Facility-Administered Medications Prior to Visit  Medication Dose Route Frequency Provider Last Rate Last Admin   Sodium Hyaluronate (Viscosup) SOSY 2.5 mL  2.5 mL Intra-articular Once Vickki Hearing, MD       Sodium Hyaluronate (Viscosup) SOSY   Intra-articular Once  Vickki Hearing, MD        No Known Allergies  ROS Review of Systems  Constitutional:  Negative for chills and fever.  HENT:  Negative for congestion and sore throat.   Eyes:  Negative for pain and discharge.  Respiratory:  Negative for cough and shortness of breath.   Cardiovascular:  Negative for chest  pain and palpitations.  Gastrointestinal:  Negative for diarrhea, nausea and vomiting.  Endocrine: Negative for polydipsia and polyuria.  Genitourinary:  Negative for dysuria and hematuria.  Musculoskeletal:  Positive for arthralgias and back pain. Negative for neck pain and neck stiffness.  Skin:  Negative for rash.  Neurological:  Negative for dizziness, weakness, numbness and headaches.  Psychiatric/Behavioral:  Negative for agitation and behavioral problems.       Objective:    Physical Exam Vitals reviewed.  Constitutional:      General: He is not in acute distress.    Appearance: He is obese. He is not diaphoretic.  HENT:     Head: Normocephalic and atraumatic.     Nose: Nose normal.     Mouth/Throat:     Mouth: Mucous membranes are moist.  Eyes:     General: No scleral icterus.    Extraocular Movements: Extraocular movements intact.  Cardiovascular:     Rate and Rhythm: Normal rate and regular rhythm.     Pulses: Normal pulses.     Heart sounds: Normal heart sounds. No murmur heard. Pulmonary:     Breath sounds: Normal breath sounds. No wheezing or rales.  Musculoskeletal:     Cervical back: Neck supple. No tenderness.     Lumbar back: Tenderness present. Decreased range of motion. Negative right straight leg raise test and negative left straight leg raise test.     Right lower leg: No edema.     Left lower leg: No edema.  Skin:    General: Skin is warm.     Findings: Rash (stasis dermatitis b/l) present.  Neurological:     General: No focal deficit present.     Mental Status: He is alert and oriented to person, place, and time.     Cranial Nerves: No  cranial nerve deficit.     Sensory: No sensory deficit.     Motor: No weakness.  Psychiatric:        Mood and Affect: Mood normal.        Behavior: Behavior normal.     BP 120/72 (BP Location: Left Arm, Patient Position: Sitting, Cuff Size: Large)   Pulse 71   Ht 6\' 5"  (1.956 m)   Wt (!) 364 lb (165.1 kg)   SpO2 95%   BMI 43.16 kg/m  Wt Readings from Last 3 Encounters:  06/22/23 (!) 364 lb (165.1 kg)  06/05/23 (!) 368 lb 12.8 oz (167.3 kg)  04/30/23 (!) 369 lb (167.4 kg)    Lab Results  Component Value Date   TSH 1.640 02/16/2023   Lab Results  Component Value Date   WBC 6.1 02/16/2023   HGB 15.2 02/16/2023   HCT 45.7 02/16/2023   MCV 80 02/16/2023   PLT 206 02/16/2023   Lab Results  Component Value Date   NA 139 02/16/2023   K 4.4 02/16/2023   CO2 24 02/16/2023   GLUCOSE 95 02/16/2023   BUN 19 02/16/2023   CREATININE 1.12 02/16/2023   BILITOT 0.5 02/16/2023   ALKPHOS 71 02/16/2023   AST 23 02/16/2023   ALT 19 02/16/2023   PROT 7.5 02/16/2023   ALBUMIN 4.5 02/16/2023   CALCIUM 10.2 02/16/2023   ANIONGAP 8 09/03/2022   EGFR 79 02/16/2023   Lab Results  Component Value Date   CHOL 115 02/16/2023   Lab Results  Component Value Date   HDL 43 02/16/2023   Lab Results  Component Value Date   LDLCALC 58 02/16/2023   Lab  Results  Component Value Date   TRIG 62 02/16/2023   Lab Results  Component Value Date   CHOLHDL 2.7 02/16/2023   Lab Results  Component Value Date   HGBA1C 5.6 06/22/2023      Assessment & Plan:   Problem List Items Addressed This Visit       Cardiovascular and Mediastinum   HTN (hypertension) (Chronic)    BP Readings from Last 1 Encounters:  06/22/23 120/72   Well-controlled with Enalapril, Chlorthalidone and Metoprolol Counseled for compliance with the medications Advised DASH diet and moderate exercise/walking, at least 150 mins/week      Relevant Medications   rosuvastatin (CRESTOR) 10 MG tablet   metoprolol  tartrate (LOPRESSOR) 25 MG tablet   enalapril (VASOTEC) 20 MG tablet   PVC (premature ventricular contraction)    Followed by cardiology On metoprolol 12.5 mg BID      Relevant Medications   rosuvastatin (CRESTOR) 10 MG tablet   metoprolol tartrate (LOPRESSOR) 25 MG tablet   enalapril (VASOTEC) 20 MG tablet     Respiratory   Obstructive sleep apnea    Uses CPAP regularly        Endocrine   Type 2 diabetes mellitus with morbid obesity (HCC) - Primary    Lab Results  Component Value Date   HGBA1C 6.1 (H) 02/16/2023   Well controlled Contunue Mounjaro for additional weight loss benefit,  increased dose to 7.5 mg now - plan to increase to 10 mg qw Advised to follow diabetic diet On ACEi and statin F/u CMP and lipid panel Diabetic eye exam: Advised to follow up with Ophthalmology for diabetic eye exam      Relevant Medications   tirzepatide (MOUNJARO) 7.5 MG/0.5ML Pen   rosuvastatin (CRESTOR) 10 MG tablet   enalapril (VASOTEC) 20 MG tablet   Other Relevant Orders   POCT glycosylated hemoglobin (Hb A1C) (Completed)     Other   Mixed hyperlipidemia (Chronic)    On Crestor Check lipid profile      Relevant Medications   rosuvastatin (CRESTOR) 10 MG tablet   metoprolol tartrate (LOPRESSOR) 25 MG tablet   enalapril (VASOTEC) 20 MG tablet   Morbid obesity (HCC)    BMI Readings from Last 2 Encounters:  06/22/23 43.16 kg/m  06/05/23 43.73 kg/m   Diet modification and moderate exercise advised Continue Mounjaro for DM and obesity      Relevant Medications   tirzepatide (MOUNJARO) 7.5 MG/0.5ML Pen   Chronic midline low back pain without sciatica    Likely due to strenuous work in the heat Has to lie flat to use CPAP device, may cause paraspinal muscle stiffness Simple back  exercises advised Flexeril as needed for back muscle spasms Heating pad and/or back brace as needed Can use lidocaine patch Avoid heavy lifting and frequent bending Check x-ray lumbar spine       Relevant Medications   cyclobenzaprine (FLEXERIL) 5 MG tablet   Other Relevant Orders   DG Lumbar Spine Complete    Meds ordered this encounter  Medications   cyclobenzaprine (FLEXERIL) 5 MG tablet    Sig: Take 1 tablet (5 mg total) by mouth 2 (two) times daily as needed for muscle spasms.    Dispense:  30 tablet    Refill:  1   tirzepatide (MOUNJARO) 7.5 MG/0.5ML Pen    Sig: Inject 7.5 mg into the skin once a week.    Dispense:  2 mL    Refill:  0    Dose change -  06/22/23   rosuvastatin (CRESTOR) 10 MG tablet    Sig: Take 1 tablet (10 mg total) by mouth daily.    Dispense:  90 tablet    Refill:  3   metoprolol tartrate (LOPRESSOR) 25 MG tablet    Sig: Take 0.5 tablets (12.5 mg total) by mouth 2 (two) times daily.    Dispense:  90 tablet    Refill:  1    ZERO refills remain on this prescription. Your patient is requesting advance approval of refills for this medication to PREVENT ANY MISSED DOSES   enalapril (VASOTEC) 20 MG tablet    Sig: Take 1 tablet (20 mg total) by mouth daily.    Dispense:  90 tablet    Refill:  1    ZERO refills remain on this prescription. Your patient is requesting advance approval of refills for this medication to PREVENT ANY MISSED DOSES    Follow-up: Return in about 4 months (around 10/23/2023) for DM and back pain.    Anabel Halon, MD

## 2023-07-02 ENCOUNTER — Ambulatory Visit (INDEPENDENT_AMBULATORY_CARE_PROVIDER_SITE_OTHER): Payer: BC Managed Care – PPO | Admitting: Podiatry

## 2023-07-02 DIAGNOSIS — Z91199 Patient's noncompliance with other medical treatment and regimen due to unspecified reason: Secondary | ICD-10-CM

## 2023-07-03 NOTE — Progress Notes (Signed)
Patient was no-show for appointment today 

## 2023-08-04 ENCOUNTER — Other Ambulatory Visit: Payer: Self-pay | Admitting: Internal Medicine

## 2023-08-04 DIAGNOSIS — E1169 Type 2 diabetes mellitus with other specified complication: Secondary | ICD-10-CM

## 2023-08-16 ENCOUNTER — Telehealth: Payer: Self-pay | Admitting: Internal Medicine

## 2023-08-16 NOTE — Telephone Encounter (Signed)
Wellness work [employer]  Noted Copied Sleeved (put in provider box)  Fax [phone] Call patient when been faxed

## 2023-08-20 NOTE — Telephone Encounter (Signed)
Paper work filled out  Faxed to number on form  Copied Faxed to [employer]  Patient notified

## 2023-08-27 NOTE — Telephone Encounter (Signed)
Patient picked up forms.

## 2023-09-04 ENCOUNTER — Other Ambulatory Visit: Payer: Self-pay | Admitting: Internal Medicine

## 2023-09-04 DIAGNOSIS — E1169 Type 2 diabetes mellitus with other specified complication: Secondary | ICD-10-CM

## 2023-09-06 ENCOUNTER — Telehealth: Payer: Self-pay

## 2023-09-06 NOTE — Telephone Encounter (Signed)
VOB submitted for Supartz, left knee Appt. Will need to be after 09/11/2023.

## 2023-09-17 ENCOUNTER — Telehealth: Payer: Self-pay

## 2023-09-17 ENCOUNTER — Other Ambulatory Visit: Payer: Self-pay

## 2023-09-17 DIAGNOSIS — M1712 Unilateral primary osteoarthritis, left knee: Secondary | ICD-10-CM

## 2023-09-17 NOTE — Telephone Encounter (Signed)
April is his supartz approved for 3 injections or 5 injections?

## 2023-09-17 NOTE — Telephone Encounter (Signed)
Please schedule patient 3 appts.with Dr. [name] for gel injections.  Thank you  All gel information has been added to the chart under the referrals tab

## 2023-09-18 NOTE — Telephone Encounter (Signed)
It's for 3

## 2023-09-18 NOTE — Telephone Encounter (Signed)
Thank you   I think Amy L. May have closed it.

## 2023-09-25 ENCOUNTER — Ambulatory Visit: Payer: BC Managed Care – PPO | Admitting: Podiatry

## 2023-09-26 ENCOUNTER — Encounter: Payer: Self-pay | Admitting: Podiatry

## 2023-09-26 ENCOUNTER — Ambulatory Visit: Payer: BC Managed Care – PPO | Admitting: Podiatry

## 2023-09-26 VITALS — Ht 77.0 in | Wt 364.0 lb

## 2023-09-26 DIAGNOSIS — M79675 Pain in left toe(s): Secondary | ICD-10-CM | POA: Diagnosis not present

## 2023-09-26 DIAGNOSIS — E1169 Type 2 diabetes mellitus with other specified complication: Secondary | ICD-10-CM | POA: Diagnosis not present

## 2023-09-26 DIAGNOSIS — B351 Tinea unguium: Secondary | ICD-10-CM | POA: Diagnosis not present

## 2023-09-26 DIAGNOSIS — M79674 Pain in right toe(s): Secondary | ICD-10-CM

## 2023-09-26 DIAGNOSIS — L84 Corns and callosities: Secondary | ICD-10-CM | POA: Diagnosis not present

## 2023-09-26 MED ORDER — TERBINAFINE HCL 250 MG PO TABS: 250.0000 mg | ORAL_TABLET | Freq: Every day | ORAL | 0 refills | Status: AC

## 2023-09-26 NOTE — Patient Instructions (Signed)
VISIT SUMMARY:  During today's visit, we addressed your concerns about painful calluses on your feet and toenail fungus. We also reviewed your diabetes management, which is well-controlled.  YOUR PLAN:  -FOOT CALLUSES: Calluses are thickened and hardened areas of skin that develop due to repeated friction or pressure. We recommend using a 40% urea cream daily and a pumice stone to help reduce the calluses. Additionally, we will consider orthotics to help redistribute pressure on your feet.  -ONYCHOMYCOSIS: Onychomycosis is a fungal infection of the toenails. We will start you on oral Lamisil for three months to treat the infection. Please follow up in four months to assess your progress.  -DIABETES: Your diabetes is well-controlled with an A1C of 5.6. Continue taking Mounjaro as prescribed.  -ORTHOTICS: Orthotics are custom-made shoe inserts that can help redistribute pressure on your feet. We have provided you with insurance codes to check if orthotics are covered. If they are, we will schedule an appointment with an orthotist for custom orthotics. The code for the orthotics to check with your insurance is [phone]  INSTRUCTIONS:  Please follow up in four months to assess the progress of your toenail fungus treatment. In the meantime, continue using the urea cream and pumice stone daily for your calluses, and check with your insurance about coverage for orthotics.

## 2023-09-27 ENCOUNTER — Ambulatory Visit: Payer: BC Managed Care – PPO | Admitting: Orthopedic Surgery

## 2023-09-27 ENCOUNTER — Encounter: Payer: Self-pay | Admitting: Orthopedic Surgery

## 2023-09-27 DIAGNOSIS — M238X2 Other internal derangements of left knee: Secondary | ICD-10-CM

## 2023-09-27 DIAGNOSIS — M1712 Unilateral primary osteoarthritis, left knee: Secondary | ICD-10-CM

## 2023-09-27 DIAGNOSIS — Z6841 Body Mass Index (BMI) 40.0 and over, adult: Secondary | ICD-10-CM

## 2023-09-27 DIAGNOSIS — G8929 Other chronic pain: Secondary | ICD-10-CM

## 2023-09-27 DIAGNOSIS — Z9889 Other specified postprocedural states: Secondary | ICD-10-CM

## 2023-09-27 MED ORDER — SODIUM HYALURONATE (VISCOSUP) 25 MG/2.5ML IX SOSY
25.0000 mg | PREFILLED_SYRINGE | INTRA_ARTICULAR | Status: AC
  Administered 2023-09-27 – 2023-10-11 (×3): 25 mg via INTRA_ARTICULAR

## 2023-09-27 NOTE — Addendum Note (Signed)
Addended byCaffie Damme on: 09/27/2023 04:47 PM   Modules accepted: Orders

## 2023-09-27 NOTE — Progress Notes (Signed)
Encounter Diagnoses  Name Primary?   Chondral defect of condyle of left femur Yes   S/P arthroscopy of left knee 05/17/21    Chronic pain of left knee    Primary osteoarthritis of left knee    Body mass index 40.0-44.9, adult [facility])     Chief Complaint  Patient presents with   Injections    Left knee / Supartz #1     Chief Complaint  Patient presents with   Injections    Left knee / Supartz #1     Encounter Diagnoses  Name Primary?   Chondral defect of condyle of left femur Yes   S/P arthroscopy of left knee 05/17/21    Chronic pain of left knee    Primary osteoarthritis of left knee    Body mass index 40.0-44.9, adult (HCC)     The patient has consented to and requested hyaluronic acid injection   SUPARTZ  left  THE knee is prepped with alcohol and ethyl chloride  The injection is performed with a 21-gauge needle, via inferolateral approach  No complications were noted  Appropriate instructions post injection were given   1 week

## 2023-09-28 NOTE — Progress Notes (Signed)
  Subjective:  Patient ID: James Rivas, male    DOB: [birth year],  MRN: [id number]  Chief Complaint  Patient presents with   Callouses    Patient is here for left foot callous    Discussed the use of AI scribe software for clinical note transcription with the patient, who gave verbal consent to proceed.  History of Present Illness   The patient, with a history of diabetes, presents with painful calluses on both feet. He describes the calluses as 'countless' and located mainly on the bottom of the feet. The patient works in a job that requires [employer] and involves a lot of walking on concrete floors. He does not currently use any inserts in his work boots.  The patient's diabetes is well-controlled with an A1C of 5.6, down from 6.5. He is currently taking Mounjaro for his diabetes. He also has significant onychomycosis of all toenails.  In addition to the foot pain and calluses, the patient has a concern about his toenails, which he describes as 'like that every day.' He is interested in treatment options for the nail fungus.          Objective:    Physical Exam   EXTREMITIES: Palpable pulses, good capillary refill time. SKIN: Thick and diffuse callus across the ball of the foot bilaterally over the lesser metatarsals. Pinched callus on the left medial hallux. Significant onychomycosis of all toenails with thickening and dystrophy.       No images are attached to the encounter.    Results   Procedure: Callus debridement Description: Thickened callus tissue was shaved down, and central cores were removed from the calluses on the bottom of the feet.  Procedure: Salicylic acid application Description: Salicylic acid cream was applied across the ball of the foot, followed by a nonstick pad.  Procedure: Toenail debridement Description: Toenails were debrided in length and thickness to manage onychomycosis.  LABS HbA1c: 5.6%      Assessment:   1. Callus of foot   2.  Onychomycosis   3. Type 2 diabetes mellitus with morbid obesity (HCC)      Plan:  Patient was evaluated and treated and all questions answered.  Assessment and Plan    Foot Calluses   We will start urea cream 40% strength for daily use and advise the use of a pumice stone daily for thick and diffuse callus across the ball of the foot bilaterally of the lesser metatarsals. Orthotics will be considered to redistribute pressure points.  Onychomycosis   Oral Lamisil will be prescribed for three months for significant onychomycosis of all toenails, with a follow-up in four months to assess progress.  Diabetes   His A1c is well-controlled at 5.6 on Mounjaro; the current medication regimen will continue.  Orthotics   We provided codes for an insurance check regarding the potential benefit of orthotics for pressure redistribution. If covered, an appointment with an orthotist for custom orthotics will be scheduled.          Return in about 4 months (around 01/25/2024) for follow up after nail fungus treatment.

## 2023-10-01 ENCOUNTER — Other Ambulatory Visit: Payer: Self-pay | Admitting: Internal Medicine

## 2023-10-01 DIAGNOSIS — E1169 Type 2 diabetes mellitus with other specified complication: Secondary | ICD-10-CM

## 2023-10-04 ENCOUNTER — Ambulatory Visit: Payer: BC Managed Care – PPO | Admitting: Orthopedic Surgery

## 2023-10-04 ENCOUNTER — Encounter: Payer: Self-pay | Admitting: Orthopedic Surgery

## 2023-10-04 DIAGNOSIS — M1712 Unilateral primary osteoarthritis, left knee: Secondary | ICD-10-CM | POA: Diagnosis not present

## 2023-10-04 DIAGNOSIS — Z9889 Other specified postprocedural states: Secondary | ICD-10-CM

## 2023-10-04 DIAGNOSIS — M238X2 Other internal derangements of left knee: Secondary | ICD-10-CM

## 2023-10-04 NOTE — Progress Notes (Signed)
Follow-up for Supartz injection  OA left knee  Supartz injection #2 of 3 left knee for osteoarthritis  The patient gave verbal consent to inject his left knee with Supartz  This is a second of 3 injections  There were no complications from the first injection  We used a lateral approach we used alcohol and ethyl chloride we injected with a 21-gauge needle there were no complications  He will return in 1 week for the third of 3 injections

## 2023-10-11 ENCOUNTER — Ambulatory Visit: Payer: BC Managed Care – PPO | Admitting: Orthopedic Surgery

## 2023-10-11 DIAGNOSIS — M238X2 Other internal derangements of left knee: Secondary | ICD-10-CM

## 2023-10-11 DIAGNOSIS — M1712 Unilateral primary osteoarthritis, left knee: Secondary | ICD-10-CM

## 2023-10-11 DIAGNOSIS — Z9889 Other specified postprocedural states: Secondary | ICD-10-CM

## 2023-10-11 NOTE — Progress Notes (Signed)
Chief Complaint  Patient presents with   Injections    Supartz #3   Encounter Diagnoses  Name Primary?   S/P arthroscopy of left knee 05/17/21 Yes   Chondral defect of condyle of left femur    Primary osteoarthritis of left knee    James Rivas has had no problems with his injections up to this point he is here for his third Supartz injection left knee  Chief Complaint  Patient presents with   Injections    Supartz #3    Encounter Diagnoses  Name Primary?   S/P arthroscopy of left knee 05/17/21 Yes   Chondral defect of condyle of left femur    Primary osteoarthritis of left knee     The patient has consented to and requested hyaluronic acid injection   MEDICATION: Supartz  Left  THE knee is prepped with alcohol and ethyl chloride  The injection is performed with a 21-gauge needle, via inferolateral approach  No complications were noted  Appropriate instructions post injection were given   5 months

## 2023-10-12 ENCOUNTER — Telehealth: Payer: Self-pay | Admitting: Internal Medicine

## 2023-10-12 MED ORDER — BLOOD GLUCOSE MONITORING SUPPL DEVI: 1.0000 | Freq: Three times a day (TID) | 0 refills | Status: AC

## 2023-10-12 MED ORDER — LANCET DEVICE MISC: 0 refills | Status: AC

## 2023-10-12 MED ORDER — BLOOD GLUCOSE TEST VI STRP: 1.0000 | ORAL_STRIP | Freq: Three times a day (TID) | 5 refills | Status: AC

## 2023-10-12 MED ORDER — LANCETS MISC. MISC: 1.0000 | Freq: Three times a day (TID) | 5 refills | Status: AC

## 2023-10-12 NOTE — Telephone Encounter (Signed)
Copied from CRM [id number]. Topic: Clinical - Prescription Issue >> Oct 12, 2023  2:26 PM Roswell Nickel wrote: Reason for CRM:  Patient calling regarding his glucose machine is not working  was wondering if Dr Allena Katz can put in an order for a new glucose machine  Pt can  reach at [phone]

## 2023-10-24 ENCOUNTER — Ambulatory Visit (INDEPENDENT_AMBULATORY_CARE_PROVIDER_SITE_OTHER): Payer: BC Managed Care – PPO | Admitting: Internal Medicine

## 2023-10-24 ENCOUNTER — Encounter: Payer: Self-pay | Admitting: Internal Medicine

## 2023-10-24 DIAGNOSIS — E1169 Type 2 diabetes mellitus with other specified complication: Secondary | ICD-10-CM

## 2023-10-24 DIAGNOSIS — R350 Frequency of micturition: Secondary | ICD-10-CM

## 2023-10-24 DIAGNOSIS — I10 Essential (primary) hypertension: Secondary | ICD-10-CM | POA: Diagnosis not present

## 2023-10-24 DIAGNOSIS — E782 Mixed hyperlipidemia: Secondary | ICD-10-CM

## 2023-10-24 DIAGNOSIS — Z7985 Long-term (current) use of injectable non-insulin antidiabetic drugs: Secondary | ICD-10-CM

## 2023-10-24 MED ORDER — TIRZEPATIDE 10 MG/0.5ML ~~LOC~~ SOAJ: 10.0000 mg | SUBCUTANEOUS | 3 refills | Status: DC

## 2023-10-24 NOTE — Assessment & Plan Note (Signed)
BP Readings from Last 1 Encounters:  10/24/23 122/75   Well-controlled with Enalapril, Chlorthalidone and Metoprolol Counseled for compliance with the medications Advised DASH diet and moderate exercise/walking, at least 150 mins/week

## 2023-10-24 NOTE — Patient Instructions (Signed)
Please start taking Mounjaro 10 mg once weekly after you complete current supplies of 7.5 mg dose.  Please continue to take other medications as prescribed.  Please continue to follow low carb diet and perform moderate exercise/walking at least 150 mins/week.

## 2023-10-24 NOTE — Assessment & Plan Note (Signed)
On Crestor Check lipid profile 

## 2023-10-24 NOTE — Assessment & Plan Note (Signed)
Since starting terbinafine, although it is very uncommon side effect Check UA with reflex culture Advised to maintain adequate hydration - he takes enalapril and chlorthalidone

## 2023-10-24 NOTE — Assessment & Plan Note (Addendum)
Lab Results  Component Value Date   HGBA1C 5.6 06/22/2023   Well controlled Contunue Mounjaro for additional weight loss benefit,  increased dose to 10 mg qw now Advised to follow diabetic diet On ACEi and statin F/u CMP and lipid panel Diabetic eye exam: Advised to follow up with Ophthalmology for diabetic eye exam

## 2023-10-24 NOTE — Progress Notes (Signed)
Established Patient Office Visit  Subjective:  Patient ID: James Rivas, male    DOB: 11-Dec-1970  Age: 52 y.o. MRN: [id number]  CC:  Chief Complaint  Patient presents with   Diabetes    Follow up    Urinary Frequency    Urinating more often than usual     HPI James Rivas is a 52 y.o. male with past medical history of HTN, type II DM with morbid obesity, OSA, gout and HLD who presents for f/u of his chronic medical conditions.  Type II DM: He is  tolerating Mounjaro 7.5 mg qw. Reports that he has struggled with controlling his food intake at times. He is trying to follow small, frequent meals.  He has lost about 3 lbs from the last visit and attributes it to not being able to exercise regularly due to knee pain. He denies any polyuria or polyphagia currently.  Denies any recurrent episode of abdominal pain, nausea or vomiting since the last visit.  HTN: BP is well-controlled. Takes medications regularly. Patient denies headache, dizziness, chest pain, dyspnea or palpitations.  Left knee pain: He has had left knee arthroscopy and recently had Supartz injection. He has noted improvement in left knee pain recently.  He reports urinary frequency for the last 2 weeks since starting terbinafine.  Denies any dysuria or hematuria.  He has been prescribed terbinafine from podiatry for onychomycosis.   Past Medical History:  Diagnosis Date   Allergy    Arthritis    left knee   Chest pain    Chronic chest pain 06/22/2013   Diabetes mellitus without complication (HCC)    Gout    Gout    History of COVID-19 12/20/2020   HTN (hypertension)    Impaired fasting glucose    Morbid obesity (HCC)    Sleep apnea    uses a CPAP   Venous stasis     Past Surgical History:  Procedure Laterality Date   COLONOSCOPY WITH PROPOFOL N/A 05/11/2020   Procedure: COLONOSCOPY WITH PROPOFOL;  Surgeon: Corbin Ade, MD;  Location: [facility];  Service: Endoscopy;  Laterality: N/A;  2:15pm   FINGER  SURGERY Left    left ring finger 2 surgeries   I & D EXTREMITY  11/29/2011   Procedure: IRRIGATION AND DEBRIDEMENT EXTREMITY;  Surgeon: Sharma Covert;  Location: [facility];  Service: Orthopedics;  Laterality: Left;  I&D Right Long and Index Fingers with Tendon Repair as Needed   KNEE ARTHROSCOPY WITH MEDIAL MENISECTOMY Left 05/17/2021   Procedure: KNEE ARTHROSCOPY WITH MICROFRACTURE;  Surgeon: Vickki Hearing, MD;  Location: [facility];  Service: Orthopedics;  Laterality: Left;   MENISECTOMY Left 05/17/2021   Procedure: PARTIAL MEDIAL MENISECTOMY;  Surgeon: Vickki Hearing, MD;  Location: [facility];  Service: Orthopedics;  Laterality: Left;    Family History  Problem Relation Age of Onset   Diabetes Father    Diabetes Paternal Aunt    Colon cancer Neg Hx     Social History   Socioeconomic History   Marital status: Married    Spouse name: Not on file   Number of children: 3   Years of education: Not on file   Highest education level: Not on file  Occupational History   Occupation: [employer]    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 1 beer or a glass of wine a couple times a week.    Drug use: No   Sexual activity: Never  Other Topics Concern   Not on file  Social History Narrative   Not on file   Social Determinants of Health   Financial Resource Strain: Not on file  Food Insecurity: Not on file  Transportation Needs: Not on file  Physical Activity: Not on file  Stress: Not on file  Social Connections: Unknown (04/11/2022)   Received from [facility], [facility]   Social Network    Social Network: Not on file  Intimate Partner Violence: Unknown (03/03/2022)   Received from [facility], [facility]   HITS    Physically Hurt: Not on file    Insult or Talk Down To: Not on file    Threaten  Physical Harm: Not on file    Scream or Curse: Not on file    Outpatient Medications Prior to Visit  Medication Sig Dispense Refill   Accu-Chek Softclix Lancets lancets Use as instructed 100 each 12   allopurinol (ZYLOPRIM) 300 MG tablet TAKE 1 TABLET(300 MG) BY MOUTH DAILY 30 tablet 5   blood glucose meter kit and supplies Dispense based on patient and insurance preference. Use daily to check blood sugar.. (FOR ICD-10 E10.9, E11.9). 1 each 0   Blood Glucose Monitoring Suppl DEVI 1 each by Does not apply route 3 (three) times daily. May substitute to any manufacturer covered by patient's insurance. 1 each 0   chlorthalidone (HYGROTON) 25 MG tablet TAKE 1 TABLET(25 MG) BY MOUTH DAILY 90 tablet 1   cyclobenzaprine (FLEXERIL) 5 MG tablet Take 1 tablet (5 mg total) by mouth 2 (two) times daily as needed for muscle spasms. 30 tablet 1   enalapril (VASOTEC) 20 MG tablet Take 1 tablet (20 mg total) by mouth daily. 90 tablet 1   Glucose Blood (BLOOD GLUCOSE TEST STRIPS) STRP 1 each by In Vitro route in the morning, at noon, and at bedtime. May substitute to any manufacturer covered by patient's insurance. 100 strip 5   glucose blood (ONETOUCH VERIO) test strip USE TO CHECK BLOOD SUGAR DAILY 100 strip 1   HYDROcodone-acetaminophen (NORCO) 5-325 MG tablet Take 1-2 tablets by mouth every 6 (six) hours as needed. 10 tablet 0   ibuprofen (ADVIL) 600 MG tablet Take 1 tablet (600 mg total) by mouth every 8 (eight) hours as needed. 30 tablet 0   Lancet Device MISC Three times daily DX e11.65 May substitute to any manufacturer covered by patient's insurance. 1 each 0   Lancets Misc. MISC 1 each by Does not apply route in the morning, at noon, and at bedtime. May substitute to any manufacturer covered by patient's insurance. 100 each 5   loperamide (IMODIUM) 2 MG capsule Take 1 capsule (2 mg total) by mouth 4 (four) times daily as needed for diarrhea or loose stools. 12 capsule 0   metoprolol tartrate (LOPRESSOR)  25 MG tablet Take 0.5 tablets (12.5 mg total) by mouth 2 (two) times daily. 90 tablet 1   omeprazole (PRILOSEC) 20 MG capsule Take 1 capsule (20 mg total) by mouth daily. 30 capsule 5   ondansetron (ZOFRAN-ODT) 8 MG disintegrating tablet 8mg  ODT q4 hours prn nausea 10 tablet 0   rosuvastatin (CRESTOR) 10 MG tablet Take 1 tablet (10 mg total) by mouth daily. 90 tablet 3   terbinafine (LAMISIL) 250 MG tablet Take 1 tablet (250  mg total) by mouth daily. 90 tablet 0   tirzepatide (MOUNJARO) 7.5 MG/0.5ML Pen ADMINISTER 7.5 MG UNDER THE SKIN 1 TIME A WEEK 2 mL 1   Facility-Administered Medications Prior to Visit  Medication Dose Route Frequency Provider Last Rate Last Admin   Sodium Hyaluronate (Viscosup) SOSY 2.5 mL  2.5 mL Intra-articular Once Vickki Hearing, MD       Sodium Hyaluronate (Viscosup) SOSY   Intra-articular Once Vickki Hearing, MD        No Known Allergies  ROS Review of Systems  Constitutional:  Negative for chills and fever.  HENT:  Negative for congestion and sore throat.   Eyes:  Negative for pain and discharge.  Respiratory:  Negative for cough and shortness of breath.   Cardiovascular:  Negative for chest pain and palpitations.  Gastrointestinal:  Negative for diarrhea, nausea and vomiting.  Endocrine: Negative for polydipsia and polyuria.  Genitourinary:  Positive for frequency. Negative for dysuria and hematuria.  Musculoskeletal:  Positive for arthralgias and back pain. Negative for neck pain and neck stiffness.  Skin:  Negative for rash.  Neurological:  Negative for dizziness, weakness, numbness and headaches.  Psychiatric/Behavioral:  Negative for agitation and behavioral problems.       Objective:    Physical Exam Vitals reviewed.  Constitutional:      General: He is not in acute distress.    Appearance: He is obese. He is not diaphoretic.  HENT:     Head: Normocephalic and atraumatic.     Nose: Nose normal.     Mouth/Throat:     Mouth: Mucous  membranes are moist.  Eyes:     General: No scleral icterus.    Extraocular Movements: Extraocular movements intact.  Cardiovascular:     Rate and Rhythm: Normal rate and regular rhythm.     Pulses: Normal pulses.     Heart sounds: Normal heart sounds. No murmur heard. Pulmonary:     Breath sounds: Normal breath sounds. No wheezing or rales.  Musculoskeletal:     Cervical back: Neck supple. No tenderness.     Right lower leg: No edema.     Left lower leg: No edema.  Skin:    General: Skin is warm.     Findings: Rash (stasis dermatitis b/l) present.  Neurological:     General: No focal deficit present.     Mental Status: He is alert and oriented to person, place, and time.     Sensory: No sensory deficit.     Motor: No weakness.  Psychiatric:        Mood and Affect: Mood normal.        Behavior: Behavior normal.     BP 122/75 (BP Location: Right Arm, Patient Position: Sitting, Cuff Size: Large)   Pulse 70   Ht 6\' 5"  (1.956 m)   Wt (!) 361 lb 6.4 oz (163.9 kg)   SpO2 94%   BMI 42.86 kg/m  Wt Readings from Last 3 Encounters:  10/24/23 (!) 361 lb 6.4 oz (163.9 kg)  09/26/23 (!) 364 lb (165.1 kg)  06/22/23 (!) 364 lb (165.1 kg)    Lab Results  Component Value Date   TSH 1.640 02/16/2023   Lab Results  Component Value Date   WBC 6.1 02/16/2023   HGB 15.2 02/16/2023   HCT 45.7 02/16/2023   MCV 80 02/16/2023   PLT 206 02/16/2023   Lab Results  Component Value Date   NA 139 02/16/2023   K 4.4  02/16/2023   CO2 24 02/16/2023   GLUCOSE 95 02/16/2023   BUN 19 02/16/2023   CREATININE 1.12 02/16/2023   BILITOT 0.5 02/16/2023   ALKPHOS 71 02/16/2023   AST 23 02/16/2023   ALT 19 02/16/2023   PROT 7.5 02/16/2023   ALBUMIN 4.5 02/16/2023   CALCIUM 10.2 02/16/2023   ANIONGAP 8 09/03/2022   EGFR 79 02/16/2023   Lab Results  Component Value Date   CHOL 115 02/16/2023   Lab Results  Component Value Date   HDL 43 02/16/2023   Lab Results  Component Value Date    LDLCALC 58 02/16/2023   Lab Results  Component Value Date   TRIG 62 02/16/2023   Lab Results  Component Value Date   CHOLHDL 2.7 02/16/2023   Lab Results  Component Value Date   HGBA1C 5.6 06/22/2023      Assessment & Plan:   Problem List Items Addressed This Visit       Cardiovascular and Mediastinum   HTN (hypertension) (Chronic)    BP Readings from Last 1 Encounters:  10/24/23 122/75   Well-controlled with Enalapril, Chlorthalidone and Metoprolol Counseled for compliance with the medications Advised DASH diet and moderate exercise/walking, at least 150 mins/week        Endocrine   Type 2 diabetes mellitus with morbid obesity (HCC) - Primary    Lab Results  Component Value Date   HGBA1C 5.6 06/22/2023   Well controlled Contunue Mounjaro for additional weight loss benefit,  increased dose to 10 mg qw now Advised to follow diabetic diet On ACEi and statin F/u CMP and lipid panel Diabetic eye exam: Advised to follow up with Ophthalmology for diabetic eye exam      Relevant Medications   tirzepatide (MOUNJARO) 10 MG/0.5ML Pen   Other Relevant Orders   CMP14+EGFR   Hemoglobin A1c   Urine Microalbumin w/creat. ratio     Other   Mixed hyperlipidemia (Chronic)    On Crestor Check lipid profile      Relevant Orders   Lipid Profile   Urinary frequency    Since starting terbinafine, although it is very uncommon side effect Check UA with reflex culture Advised to maintain adequate hydration - he takes enalapril and chlorthalidone       Relevant Orders   UA/M w/rflx Culture, Routine     Meds ordered this encounter  Medications   tirzepatide (MOUNJARO) 10 MG/0.5ML Pen    Sig: Inject 10 mg into the skin once a week.    Dispense:  2 mL    Refill:  3    Follow-up: Return in about 4 months (around 02/21/2024) for Annual physical (after 02/16/24).    Anabel Halon, MD

## 2023-10-26 LAB — CMP14+EGFR
ALT: 17 [IU]/L (ref 0–44)
AST: 20 [IU]/L (ref 0–40)
Albumin: 4.4 g/dL (ref 3.8–4.9)
Alkaline Phosphatase: 62 [IU]/L (ref 44–121)
BUN/Creatinine Ratio: 20 (ref 9–20)
BUN: 21 mg/dL (ref 6–24)
Bilirubin Total: 0.6 mg/dL (ref 0.0–1.2)
CO2: 25 mmol/L (ref 20–29)
Calcium: 9.6 mg/dL (ref 8.7–10.2)
Chloride: 101 mmol/L (ref 96–106)
Creatinine, Ser: 1.06 mg/dL (ref 0.76–1.27)
Globulin, Total: 3.1 g/dL (ref 1.5–4.5)
Glucose: 80 mg/dL (ref 70–99)
Potassium: 4.4 mmol/L (ref 3.5–5.2)
Sodium: 141 mmol/L (ref 134–144)
Total Protein: 7.5 g/dL (ref 6.0–8.5)
eGFR: 84 mL/min/{1.73_m2} (ref >59)

## 2023-10-26 LAB — UA/M W/RFLX CULTURE, ROUTINE
Bilirubin, UA: NEGATIVE
Glucose, UA: NEGATIVE
Ketones, UA: NEGATIVE
Leukocytes,UA: NEGATIVE
Nitrite, UA: NEGATIVE
Protein,UA: NEGATIVE
RBC, UA: NEGATIVE
Specific Gravity, UA: 1.021 (ref 1.005–1.030)
Urobilinogen, Ur: 0.2 mg/dL (ref 0.2–1.0)
pH, UA: 6.5 (ref 5.0–7.5)

## 2023-10-26 LAB — LIPID PANEL
Chol/HDL Ratio: 2.7 {ratio} (ref 0.0–5.0)
Cholesterol, Total: 116 mg/dL (ref 100–199)
HDL: 43 mg/dL (ref >39)
LDL Chol Calc (NIH): 58 mg/dL (ref 0–99)
Triglycerides: 75 mg/dL (ref 0–149)
VLDL Cholesterol Cal: 15 mg/dL (ref 5–40)

## 2023-10-26 LAB — MICROALBUMIN / CREATININE URINE RATIO
Creatinine, Urine: 131.1 mg/dL
Microalb/Creat Ratio: 4 mg/g{creat} (ref 0–29)
Microalbumin, Urine: 5.5 ug/mL

## 2023-10-26 LAB — HEMOGLOBIN A1C
Est. average glucose Bld gHb Est-mCnc: 128 mg/dL
Hgb A1c MFr Bld: 6.1 % — ABNORMAL HIGH (ref 4.8–5.6)

## 2023-10-26 LAB — MICROSCOPIC EXAMINATION
Bacteria, UA: NONE SEEN
Casts: NONE SEEN /[LPF]
Epithelial Cells (non renal): NONE SEEN /[HPF] (ref 0–10)
RBC, Urine: NONE SEEN /[HPF] (ref 0–2)
WBC, UA: NONE SEEN /[HPF] (ref 0–5)

## 2023-11-06 ENCOUNTER — Encounter: Payer: Self-pay | Admitting: Internal Medicine

## 2023-11-07 ENCOUNTER — Other Ambulatory Visit: Payer: Self-pay | Admitting: Internal Medicine

## 2023-11-07 DIAGNOSIS — I10 Essential (primary) hypertension: Secondary | ICD-10-CM

## 2023-12-04 ENCOUNTER — Telehealth: Payer: Self-pay

## 2023-12-04 NOTE — Telephone Encounter (Signed)
 Ok to send ?   Copied from CRM [id number]. Topic: Clinical - Prescription Issue >> Dec 03, 2023  3:31 PM James Rivas wrote: Reason for CRM: need replacement mask/nose piece for cpap machine, his has a hole in it so it is not working properly, please [phone]

## 2023-12-10 ENCOUNTER — Encounter: Payer: Self-pay | Admitting: Internal Medicine

## 2023-12-10 ENCOUNTER — Other Ambulatory Visit: Payer: Self-pay

## 2023-12-10 MED ORDER — NEBULIZER MASK ADULT/TUBING MISC: 1.0000 | Freq: Every day | 11 refills | Status: AC

## 2023-12-10 NOTE — Telephone Encounter (Signed)
 Copied from CRM [id number]. Topic: Clinical - Medication Question >> Dec 10, 2023 12:01 PM Joanette Gula wrote: Dahlia Client w/[facility] asks to send over the ICD10 for the mask to [phone]... if need be call [phone]

## 2023-12-10 NOTE — Telephone Encounter (Signed)
 Rx sent for mask and tubing to [facility]

## 2023-12-11 ENCOUNTER — Other Ambulatory Visit: Payer: Self-pay | Admitting: Internal Medicine

## 2023-12-11 DIAGNOSIS — G4733 Obstructive sleep apnea (adult) (pediatric): Secondary | ICD-10-CM

## 2023-12-11 MED ORDER — MISC. DEVICES KIT: PACK | 0 refills | Status: AC

## 2023-12-11 NOTE — Telephone Encounter (Signed)
 Pt needing supplies for cpap, faxed over today at lunch time spoke to pharmacy let them know, please refer to other TE

## 2023-12-14 ENCOUNTER — Telehealth: Payer: Self-pay | Admitting: Internal Medicine

## 2023-12-14 NOTE — Telephone Encounter (Signed)
Patient came by office  Pharm states they have not received orders for patients cpap needs

## 2023-12-17 NOTE — Telephone Encounter (Signed)
Rx was faxed on 12/11/23, with cpap notes , will print and fax again.

## 2023-12-19 DIAGNOSIS — G4733 Obstructive sleep apnea (adult) (pediatric): Secondary | ICD-10-CM | POA: Diagnosis not present

## 2024-01-12 ENCOUNTER — Telehealth: Payer: Self-pay | Admitting: Orthopaedic Surgery

## 2024-01-23 ENCOUNTER — Encounter: Payer: Self-pay | Admitting: Podiatry

## 2024-01-23 ENCOUNTER — Ambulatory Visit: Payer: BC Managed Care – PPO | Admitting: Podiatry

## 2024-01-23 DIAGNOSIS — E1169 Type 2 diabetes mellitus with other specified complication: Secondary | ICD-10-CM | POA: Diagnosis not present

## 2024-01-23 DIAGNOSIS — B351 Tinea unguium: Secondary | ICD-10-CM

## 2024-01-23 DIAGNOSIS — L84 Corns and callosities: Secondary | ICD-10-CM

## 2024-01-23 MED ORDER — TERBINAFINE HCL 250 MG PO TABS: 250.0000 mg | ORAL_TABLET | Freq: Every day | ORAL | 0 refills | Status: DC

## 2024-01-23 NOTE — Patient Instructions (Signed)
 Look for salicylic 40% cream or ointment and apply to the thickened dry skin / calluses. This can be bought over the counter, at a pharmacy or online such as [employer].

## 2024-01-23 NOTE — Progress Notes (Signed)
  Subjective:  Patient ID: James Rivas, male    DOB: [birth year],  MRN: [id number] Chief Complaint  Patient presents with   Nail Problem    "I took all the medicine, 90 days.  I don't see a difference.     History of Present Illness   Calluses are still quite painful.  He completed the Lamisil but has not seen much change in the nails      Objective:    Physical Exam   EXTREMITIES: Palpable pulses, good capillary refill time. SKIN: Thick and diffuse callus across the ball of the foot bilaterally over the lesser metatarsals. Pinched callus on the left medial hallux. Significant onychomycosis of all toenails with thickening and dystrophy.  Minimal change      No images are attached to the encounter.    Results   Procedure: Callus debridement Description: Thickened callus tissue was shaved down, and central cores were removed from the calluses on the bottom of the feet.  Procedure: Salicylic acid application Description: Salicylic acid cream was applied across the ball of the foot, followed by a nonstick pad.  LABS HbA1c: 5.6%      Assessment:   1. Callus of foot   2. Onychomycosis   3. Type 2 diabetes mellitus with morbid obesity (HCC)      Plan:  Patient was evaluated and treated and all questions answered.  Assessment and Plan    Foot Calluses   Calluses debrided sharply with a scalpel to tolerance and salicylic acid treatment applied.  I recommended he use this at home as well.  Onychomycosis   Second course of Lamisil prescribed.  If not much change after next visit may not be curable  Return in about 3 months (around 04/21/2024) for painful thick fungal nails. calluses.

## 2024-02-06 ENCOUNTER — Encounter: Payer: Self-pay | Admitting: Internal Medicine

## 2024-02-07 NOTE — Telephone Encounter (Signed)
 Scheduled

## 2024-02-12 ENCOUNTER — Encounter: Payer: Self-pay | Admitting: Internal Medicine

## 2024-02-12 ENCOUNTER — Ambulatory Visit (INDEPENDENT_AMBULATORY_CARE_PROVIDER_SITE_OTHER): Payer: Self-pay | Admitting: Internal Medicine

## 2024-02-12 VITALS — BP 111/70 | HR 86 | Ht 77.0 in | Wt 352.4 lb

## 2024-02-12 DIAGNOSIS — Z23 Encounter for immunization: Secondary | ICD-10-CM

## 2024-02-12 DIAGNOSIS — G5711 Meralgia paresthetica, right lower limb: Secondary | ICD-10-CM | POA: Diagnosis not present

## 2024-02-12 DIAGNOSIS — L84 Corns and callosities: Secondary | ICD-10-CM

## 2024-02-12 MED ORDER — IBUPROFEN 600 MG PO TABS: 600.0000 mg | ORAL_TABLET | Freq: Three times a day (TID) | ORAL | 0 refills | Status: DC | PRN

## 2024-02-12 MED ORDER — ALLOPURINOL 300 MG PO TABS: 300.0000 mg | ORAL_TABLET | Freq: Every day | ORAL | 3 refills | Status: AC

## 2024-02-12 NOTE — Patient Instructions (Addendum)
 Please avoid tight garments and belts.  Meralgia paresthetica happens due to entrapment of the lateral femoral cutaneous nerve as it passes underneath or through the inguinal ligament. The most common risk factors are obesity, diabetes mellitus, and older age.  Please perform simple exercises to help with discomfort.  3 Femoral Nerve Exercises (Meralgia Paresthetica) [email or web address]

## 2024-02-13 DIAGNOSIS — L84 Corns and callosities: Secondary | ICD-10-CM | POA: Insufficient documentation

## 2024-02-13 NOTE — Progress Notes (Signed)
 Acute Office Visit  Subjective:    Patient ID: James Rivas, male    DOB: Apr 23, 1971, 53 y.o.   MRN: [id number]  Chief Complaint  Patient presents with   Leg Pain    Right leg pain, worsens at night time, ongoing for 1 week and a half.     HPI Patient is in today for complaint of intermittent burning sensation on the anterolateral part of the right thigh for the last 10 days.  He reports tingling sensation of the thigh, especially at nighttime.  Denies any recent injury, fall or heavy lifting.  Denies any acute low back pain.  He has history of type II DM, but denies any numbness or tingling of the feet.  Past Medical History:  Diagnosis Date   Allergy    Arthritis    left knee   Chest pain    Chronic chest pain 06/22/2013   Diabetes mellitus without complication (HCC)    Gout    Gout    History of COVID-19 12/20/2020   HTN (hypertension)    Impaired fasting glucose    Morbid obesity (HCC)    Sleep apnea    uses a CPAP   Venous stasis     Past Surgical History:  Procedure Laterality Date   COLONOSCOPY WITH PROPOFOL N/A 05/11/2020   Procedure: COLONOSCOPY WITH PROPOFOL;  Surgeon: Corbin Ade, MD;  [facility];  Service: Endoscopy;  Laterality: N/A;  2:15pm   FINGER SURGERY Left    left ring finger 2 surgeries   I & D EXTREMITY  11/29/2011   Procedure: IRRIGATION AND DEBRIDEMENT EXTREMITY;  Surgeon: Sharma Covert;  Location: [facility];  Service: Orthopedics;  Laterality: Left;  I&D Right Long and Index Fingers with Tendon Repair as Needed   KNEE ARTHROSCOPY WITH MEDIAL MENISECTOMY Left 05/17/2021   Procedure: KNEE ARTHROSCOPY WITH MICROFRACTURE;  Surgeon: Vickki Hearing, MD;  Location: [facility];  Service: Orthopedics;  Laterality: Left;   MENISECTOMY Left 05/17/2021   Procedure: PARTIAL MEDIAL MENISECTOMY;  Surgeon: Vickki Hearing, MD;  Location: [facility];  Service: Orthopedics;  Laterality: Left;    Family History  Problem Relation Age of Onset    Diabetes Father    Diabetes Paternal Aunt    Colon cancer Neg Hx     Social History   Socioeconomic History   Marital status: Married    Spouse name: Not on file   Number of children: 3   Years of education: Not on file   Highest education level: Not on file  Occupational History   Occupation: [employer]    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 1 beer or a glass of wine a couple times a week.    Drug use: No   Sexual activity: Never  Other Topics Concern   Not on file  Social History Narrative   Not on file   Social Drivers of Health   Financial Resource Strain: Not on file  Food Insecurity: Not on file  Transportation Needs: Not on file  Physical Activity: Not on file  Stress: Not on file  Social Connections: Unknown (04/11/2022)   Received from [facility], [facility]   Social Network    Social Network: Not on file  Intimate Partner Violence: Unknown (03/03/2022)  Received from [facility], [facility]   HITS    Physically Hurt: Not on file    Insult or Talk Down To: Not on file    Threaten Physical Harm: Not on file    Scream or Curse: Not on file    Outpatient Medications Prior to Visit  Medication Sig Dispense Refill   Accu-Chek Softclix Lancets lancets Use as instructed 100 each 12   blood glucose meter kit and supplies Dispense based on patient and insurance preference. Use daily to check blood sugar.. (FOR ICD-10 E10.9, E11.9). 1 each 0   Blood Glucose Monitoring Suppl DEVI 1 each by Does not apply route 3 (three) times daily. May substitute to any manufacturer covered by patient's insurance. 1 each 0   chlorthalidone (HYGROTON) 25 MG tablet TAKE 1 TABLET(25 MG) BY MOUTH DAILY 90 tablet 1   cyclobenzaprine (FLEXERIL) 5 MG tablet Take 1 tablet (5 mg total) by mouth 2 (two) times daily as  needed for muscle spasms. 30 tablet 1   enalapril (VASOTEC) 20 MG tablet Take 1 tablet (20 mg total) by mouth daily. 90 tablet 1   Glucose Blood (BLOOD GLUCOSE TEST STRIPS) STRP 1 each by In Vitro route in the morning, at noon, and at bedtime. May substitute to any manufacturer covered by patient's insurance. 100 strip 5   glucose blood (ONETOUCH VERIO) test strip USE TO CHECK BLOOD SUGAR DAILY 100 strip 1   HYDROcodone-acetaminophen (NORCO) 5-325 MG tablet Take 1-2 tablets by mouth every 6 (six) hours as needed. 10 tablet 0   Lancet Device MISC Three times daily DX e11.65 May substitute to any manufacturer covered by patient's insurance. 1 each 0   loperamide (IMODIUM) 2 MG capsule Take 1 capsule (2 mg total) by mouth 4 (four) times daily as needed for diarrhea or loose stools. 12 capsule 0   metoprolol tartrate (LOPRESSOR) 25 MG tablet Take 0.5 tablets (12.5 mg total) by mouth 2 (two) times daily. 90 tablet 1   Misc. Devices KIT CPAP nose piece and heated tubing. 1 kit 0   omeprazole (PRILOSEC) 20 MG capsule Take 1 capsule (20 mg total) by mouth daily. 30 capsule 5   ondansetron (ZOFRAN-ODT) 8 MG disintegrating tablet 8mg  ODT q4 hours prn nausea 10 tablet 0   Respiratory Therapy Supplies (NEBULIZER MASK ADULT/TUBING) MISC 1 each by Does not apply route at bedtime. 1 each 11   rosuvastatin (CRESTOR) 10 MG tablet Take 1 tablet (10 mg total) by mouth daily. 90 tablet 3   terbinafine (LAMISIL) 250 MG tablet Take 1 tablet (250 mg total) by mouth daily. 90 tablet 0   tirzepatide (MOUNJARO) 10 MG/0.5ML Pen Inject 10 mg into the skin once a week. 2 mL 3   allopurinol (ZYLOPRIM) 300 MG tablet TAKE 1 TABLET(300 MG) BY MOUTH DAILY 30 tablet 5   ibuprofen (ADVIL) 600 MG tablet Take 1 tablet (600 mg total) by mouth every 8 (eight) hours as needed. 30 tablet 0   Facility-Administered Medications Prior to Visit  Medication Dose Route Frequency Provider Last Rate Last Admin   Sodium Hyaluronate (Viscosup) SOSY  2.5 mL  2.5 mL Intra-articular Once Vickki Hearing, MD       Sodium Hyaluronate (Viscosup) SOSY   Intra-articular Once Vickki Hearing, MD        No Known Allergies  Review of Systems  Constitutional:  Negative for chills and fever.  HENT:  Negative for congestion and sore throat.   Eyes:  Negative for pain  and discharge.  Respiratory:  Negative for cough and shortness of breath.   Cardiovascular:  Negative for chest pain and palpitations.  Gastrointestinal:  Negative for diarrhea, nausea and vomiting.  Endocrine: Negative for polydipsia and polyuria.  Genitourinary:  Positive for frequency. Negative for dysuria and hematuria.  Musculoskeletal:  Positive for arthralgias. Negative for neck pain and neck stiffness.       Burning pain of thigh area  Skin:  Negative for rash.  Neurological:  Negative for dizziness, weakness and headaches.  Psychiatric/Behavioral:  Negative for agitation and behavioral problems.        Objective:    Physical Exam Vitals reviewed.  Constitutional:      General: He is not in acute distress.    Appearance: He is obese. He is not diaphoretic.  HENT:     Head: Normocephalic and atraumatic.     Nose: Nose normal.     Mouth/Throat:     Mouth: Mucous membranes are moist.  Eyes:     General: No scleral icterus.    Extraocular Movements: Extraocular movements intact.  Cardiovascular:     Rate and Rhythm: Normal rate and regular rhythm.     Pulses: Normal pulses.     Heart sounds: Normal heart sounds. No murmur heard. Pulmonary:     Breath sounds: Normal breath sounds. No wheezing or rales.  Musculoskeletal:     Cervical back: Neck supple. No tenderness.     Right lower leg: No edema.     Left lower leg: No edema.  Feet:     Right foot:     Skin integrity: Callus present.     Left foot:     Skin integrity: Callus present.  Skin:    General: Skin is warm.     Findings: Rash (stasis dermatitis b/l) present.  Neurological:     General:  No focal deficit present.     Mental Status: He is alert and oriented to person, place, and time.     Sensory: No sensory deficit.     Motor: No weakness.  Psychiatric:        Mood and Affect: Mood normal.        Behavior: Behavior normal.     BP 111/70   Pulse 86   Ht 6\' 5"  (1.956 m)   Wt (!) 352 lb 6.4 oz (159.8 kg)   SpO2 93%   BMI 41.79 kg/m  Wt Readings from Last 3 Encounters:  02/12/24 (!) 352 lb 6.4 oz (159.8 kg)  10/24/23 (!) 361 lb 6.4 oz (163.9 kg)  09/26/23 (!) 364 lb (165.1 kg)        Assessment & Plan:   Problem List Items Addressed This Visit       Nervous and Auditory   Meralgia paresthetica of right side - Primary   Anterolateral thigh area tingling and burning sensation likely due to meralgia paresthetica-reassured Advised to avoid tight fitting undergarments/clothing and tight belts Ibuprofen as needed for pain If persistent, will consider gabapentin Advised to perform simple hip exercises Continue to follow low-carb diet to focus on weight loss      Relevant Medications   allopurinol (ZYLOPRIM) 300 MG tablet   ibuprofen (ADVIL) 600 MG tablet     Musculoskeletal and Integument   Callus of foot   Reports multiple recurrent callus on feet Referred to podiatry      Relevant Orders   Ambulatory referral to Podiatry     Meds ordered this encounter  Medications  allopurinol (ZYLOPRIM) 300 MG tablet    Sig: Take 1 tablet (300 mg total) by mouth daily.    Dispense:  90 tablet    Refill:  3   ibuprofen (ADVIL) 600 MG tablet    Sig: Take 1 tablet (600 mg total) by mouth every 8 (eight) hours as needed.    Dispense:  30 tablet    Refill:  0     Quinzell Malcomb Concha Se, MD

## 2024-02-13 NOTE — Assessment & Plan Note (Signed)
 Anterolateral thigh area tingling and burning sensation likely due to meralgia paresthetica-reassured Advised to avoid tight fitting undergarments/clothing and tight belts Ibuprofen as needed for pain If persistent, will consider gabapentin Advised to perform simple hip exercises Continue to follow low-carb diet to focus on weight loss

## 2024-02-13 NOTE — Assessment & Plan Note (Signed)
 Reports multiple recurrent callus on feet Referred to podiatry

## 2024-02-25 ENCOUNTER — Encounter: Payer: Self-pay | Admitting: Internal Medicine

## 2024-02-25 ENCOUNTER — Encounter: Payer: BC Managed Care – PPO | Admitting: Internal Medicine

## 2024-02-25 ENCOUNTER — Other Ambulatory Visit: Payer: Self-pay | Admitting: Internal Medicine

## 2024-02-25 DIAGNOSIS — E1169 Type 2 diabetes mellitus with other specified complication: Secondary | ICD-10-CM

## 2024-02-26 ENCOUNTER — Ambulatory Visit: Admitting: Podiatry

## 2024-02-27 ENCOUNTER — Other Ambulatory Visit: Payer: Self-pay | Admitting: Internal Medicine

## 2024-02-28 ENCOUNTER — Ambulatory Visit (INDEPENDENT_AMBULATORY_CARE_PROVIDER_SITE_OTHER): Payer: BC Managed Care – PPO | Admitting: Internal Medicine

## 2024-02-28 ENCOUNTER — Encounter: Payer: Self-pay | Admitting: Internal Medicine

## 2024-02-28 VITALS — BP 95/62 | HR 74 | Ht 77.0 in | Wt 355.0 lb

## 2024-02-28 DIAGNOSIS — I10 Essential (primary) hypertension: Secondary | ICD-10-CM

## 2024-02-28 DIAGNOSIS — E1169 Type 2 diabetes mellitus with other specified complication: Secondary | ICD-10-CM

## 2024-02-28 DIAGNOSIS — E782 Mixed hyperlipidemia: Secondary | ICD-10-CM | POA: Diagnosis not present

## 2024-02-28 DIAGNOSIS — I493 Ventricular premature depolarization: Secondary | ICD-10-CM

## 2024-02-28 DIAGNOSIS — M1A9XX0 Chronic gout, unspecified, without tophus (tophi): Secondary | ICD-10-CM

## 2024-02-28 DIAGNOSIS — Z6841 Body Mass Index (BMI) 40.0 and over, adult: Secondary | ICD-10-CM

## 2024-02-28 DIAGNOSIS — Z0001 Encounter for general adult medical examination with abnormal findings: Secondary | ICD-10-CM | POA: Diagnosis not present

## 2024-02-28 DIAGNOSIS — E559 Vitamin D deficiency, unspecified: Secondary | ICD-10-CM

## 2024-02-28 DIAGNOSIS — Z23 Encounter for immunization: Secondary | ICD-10-CM

## 2024-02-28 DIAGNOSIS — Z125 Encounter for screening for malignant neoplasm of prostate: Secondary | ICD-10-CM

## 2024-02-28 LAB — BAYER DCA HB A1C WAIVED: HB A1C (BAYER DCA - WAIVED): 5.5 % (ref 4.8–5.6)

## 2024-02-28 MED ORDER — ENALAPRIL MALEATE 20 MG PO TABS: 20.0000 mg | ORAL_TABLET | Freq: Every day | ORAL | 3 refills | Status: DC

## 2024-02-28 MED ORDER — METOPROLOL TARTRATE 25 MG PO TABS: 12.5000 mg | ORAL_TABLET | Freq: Two times a day (BID) | ORAL | 3 refills | Status: DC

## 2024-02-28 NOTE — Assessment & Plan Note (Signed)
 Ordered PSA after discussing its limitations for prostate cancer screening, including false positive results leading to additional investigations.

## 2024-02-28 NOTE — Progress Notes (Signed)
 Established Patient Office Visit  Subjective:  Patient ID: James Rivas, male    DOB: [birth year]  Age: 53 y.o. MRN: [id number]  CC:  Chief Complaint  Patient presents with  . Annual Exam    Cpe.     HPI [name] is a 53 y.o. male with past medical history of HTN, type II DM with morbid obesity, OSA, gout and HLD who presents for annual physical.  Type II DM: He is  tolerating Mounjaro 10 mg qw. Reports that he has struggled with controlling his food intake at times. He is trying to follow small, frequent meals.  He has lost about 6 lbs from the last visit. He denies any polyuria or polyphagia currently.  Denies any recurrent episode of abdominal pain, nausea or vomiting since the last visit.  HTN: BP is well-controlled. Takes medications regularly. Patient denies headache, dizziness, chest pain, dyspnea or palpitations.  Left knee pain: He has had left knee arthroscopy and had Supartz injection. He has noted improvement in left knee pain recently.  His right thigh pain/burning has improved in the last week.    Past Medical History:  Diagnosis Date  . Allergy   . Arthritis    left knee  . Chest pain   . Chronic chest pain 06/22/2013  . Diabetes mellitus without complication (HCC)   . Gout   . Gout   . History of COVID-19 12/20/2020  . HTN (hypertension)   . Impaired fasting glucose   . Morbid obesity (HCC)   . Sleep apnea    uses a CPAP  . Venous stasis     Past Surgical History:  Procedure Laterality Date  . COLONOSCOPY WITH PROPOFOL N/A 05/11/2020   Procedure: COLONOSCOPY WITH PROPOFOL;  Surgeon: Corbin Ade, MD;  [facility]: [facility];  Service: Endoscopy;  Laterality: N/A;  2:15pm  . FINGER SURGERY Left    left ring finger 2 surgeries  . I & D EXTREMITY  11/29/2011   Procedure: IRRIGATION AND DEBRIDEMENT EXTREMITY;  Surgeon: Sharma Covert;  Location: [facility];  Service: Orthopedics;  Laterality: Left;  I&D Right Long and Index Fingers with Tendon Repair as  Needed  . KNEE ARTHROSCOPY WITH MEDIAL MENISECTOMY Left 05/17/2021   Procedure: KNEE ARTHROSCOPY WITH MICROFRACTURE;  Surgeon: Vickki Hearing, MD;  Location: [facility];  Service: Orthopedics;  Laterality: Left;  . MENISECTOMY Left 05/17/2021   Procedure: PARTIAL MEDIAL MENISECTOMY;  Surgeon: Vickki Hearing, MD;  Location: [facility];  Service: Orthopedics;  Laterality: Left;    Family History  Problem Relation Age of Onset  . Diabetes Father   . Diabetes Paternal Aunt   . Colon cancer Neg Hx     Social History   Socioeconomic History  . Marital status: Married    Spouse name: Not on file  . Number of children: 3  . Years of education: Not on file  . Highest education level: Not on file  Occupational History  . Occupation: [employer]    Comment: Orthoptist  Tobacco Use  . Smoking status: Never  . Smokeless tobacco: Never  Vaping Use  . Vaping status: Never Used  Substance and Sexual Activity  . Alcohol use: Yes    Comment: occ. 1 beer or a glass of wine a couple times a week.   . Drug use: No  . Sexual activity: Never  Other Topics Concern  . Not on file  Social History Narrative  . Not on file  Social Drivers of Corporate investment banker Strain: Not on file  Food Insecurity: Not on file  Transportation Needs: Not on file  Physical Activity: Not on file  Stress: Not on file  Social Connections: Unknown (04/11/2022)   [facility], [facility]   Social Network   . Social Network: Not on file  Intimate Partner Violence: Unknown (03/03/2022)   Received from [facility], [facility]   HITS   . Physically Hurt: Not on file   . Insult or Talk Down To: Not on file   . Threaten Physical Harm: Not on file   . Scream or Curse: Not on file    Outpatient Medications Prior to Visit  Medication Sig Dispense Refill  . Accu-Chek Softclix Lancets lancets Use as instructed 100 each 12  . allopurinol (ZYLOPRIM) 300 MG tablet Take 1 tablet (300  mg total) by mouth daily. 90 tablet 3  . blood glucose meter kit and supplies Dispense based on patient and insurance preference. Use daily to check blood sugar.. (FOR ICD-10 E10.9, E11.9). 1 each 0  . Blood Glucose Monitoring Suppl DEVI 1 each by Does not apply route 3 (three) times daily. May substitute to any manufacturer covered by patient's insurance. 1 each 0  . chlorthalidone (HYGROTON) 25 MG tablet TAKE 1 TABLET(25 MG) BY MOUTH DAILY 90 tablet 1  . cyclobenzaprine (FLEXERIL) 5 MG tablet Take 1 tablet (5 mg total) by mouth 2 (two) times daily as needed for muscle spasms. 30 tablet 1  . Glucose Blood (BLOOD GLUCOSE TEST STRIPS) STRP 1 each by In Vitro route in the morning, at noon, and at bedtime. May substitute to any manufacturer covered by patient's insurance. 100 strip 5  . glucose blood (ONETOUCH VERIO) test strip USE TO CHECK BLOOD SUGAR DAILY 100 strip 1  . HYDROcodone-acetaminophen (NORCO) 5-325 MG tablet Take 1-2 tablets by mouth every 6 (six) hours as needed. 10 tablet 0  . ibuprofen (ADVIL) 600 MG tablet Take 1 tablet (600 mg total) by mouth every 8 (eight) hours as needed. 30 tablet 0  . Lancet Device MISC Three times daily DX e11.65 May substitute to any manufacturer covered by patient's insurance. 1 each 0  . loperamide (IMODIUM) 2 MG capsule Take 1 capsule (2 mg total) by mouth 4 (four) times daily as needed for diarrhea or loose stools. 12 capsule 0  . Misc. Devices KIT CPAP nose piece and heated tubing. 1 kit 0  . omeprazole (PRILOSEC) 20 MG capsule Take 1 capsule (20 mg total) by mouth daily. 30 capsule 5  . ondansetron (ZOFRAN-ODT) 8 MG disintegrating tablet 8mg  ODT q4 hours prn nausea 10 tablet 0  . Respiratory Therapy Supplies (NEBULIZER MASK ADULT/TUBING) MISC 1 each by Does not apply route at bedtime. 1 each 11  . rosuvastatin (CRESTOR) 10 MG tablet Take 1 tablet (10 mg total) by mouth daily. 90 tablet 3  . tirzepatide (MOUNJARO) 10 MG/0.5ML Pen ADMINISTER 10 MG UNDER THE  SKIN 1 TIME A WEEK 2 mL 5  . enalapril (VASOTEC) 20 MG tablet Take 1 tablet (20 mg total) by mouth daily. 90 tablet 1  . metoprolol tartrate (LOPRESSOR) 25 MG tablet Take 0.5 tablets (12.5 mg total) by mouth 2 (two) times daily. 90 tablet 1  . terbinafine (LAMISIL) 250 MG tablet Take 1 tablet (250 mg total) by mouth daily. 90 tablet 0   Facility-Administered Medications Prior to Visit  Medication Dose Route Frequency Provider Last Rate Last Admin  .  Sodium Hyaluronate (Viscosup) SOSY 2.5 mL  2.5 mL Intra-articular Once Vickki Hearing, MD      . Sodium Hyaluronate (Viscosup) SOSY   Intra-articular Once Vickki Hearing, MD        No Known Allergies  ROS Review of Systems  Constitutional:  Negative for chills and fever.  HENT:  Negative for congestion and sore throat.   Eyes:  Negative for pain and discharge.  Respiratory:  Negative for cough and shortness of breath.   Cardiovascular:  Negative for chest pain and palpitations.  Gastrointestinal:  Negative for diarrhea, nausea and vomiting.  Endocrine: Negative for polydipsia and polyuria.  Genitourinary:  Negative for dysuria and hematuria.  Musculoskeletal:  Positive for arthralgias and back pain. Negative for neck pain and neck stiffness.  Skin:  Negative for rash.  Neurological:  Negative for dizziness, weakness, numbness and headaches.  Psychiatric/Behavioral:  Negative for agitation and behavioral problems.       Objective:    Physical Exam Vitals reviewed.  Constitutional:      General: He is not in acute distress.    Appearance: He is obese. He is not diaphoretic.  HENT:     Head: Normocephalic and atraumatic.     Nose: Nose normal.     Mouth/Throat:     Mouth: Mucous membranes are moist.  Eyes:     General: No scleral icterus.    Extraocular Movements: Extraocular movements intact.  Cardiovascular:     Rate and Rhythm: Normal rate and regular rhythm.     Heart sounds: Normal heart sounds. No murmur  heard. Pulmonary:     Breath sounds: Normal breath sounds. No wheezing or rales.  Abdominal:     Palpations: Abdomen is soft.     Tenderness: There is no abdominal tenderness.  Musculoskeletal:     Cervical back: Neck supple. No tenderness.     Right lower leg: Edema (1+) present.     Left lower leg: Edema (1+) present.  Skin:    General: Skin is warm.     Findings: Rash (stasis dermatitis b/l) present.  Neurological:     General: No focal deficit present.     Mental Status: He is alert and oriented to person, place, and time.     Cranial Nerves: No cranial nerve deficit.     Sensory: No sensory deficit.     Motor: No weakness.  Psychiatric:        Mood and Affect: Mood normal.        Behavior: Behavior normal.    BP 95/62   Pulse 74   Ht 6\' 5"  (1.956 m)   Wt (!) 355 lb (161 kg)   SpO2 97%   BMI 42.10 kg/m  Wt Readings from Last 3 Encounters:  02/28/24 (!) 355 lb (161 kg)  02/12/24 (!) 352 lb 6.4 oz (159.8 kg)  10/24/23 (!) 361 lb 6.4 oz (163.9 kg)    Lab Results  Component Value Date   TSH 1.640 02/16/2023   Lab Results  Component Value Date   WBC 6.1 02/16/2023   HGB 15.2 02/16/2023   HCT 45.7 02/16/2023   MCV 80 02/16/2023   PLT 206 02/16/2023   Lab Results  Component Value Date   NA 141 10/24/2023   K 4.4 10/24/2023   CO2 25 10/24/2023   GLUCOSE 80 10/24/2023   BUN 21 10/24/2023   CREATININE 1.06 10/24/2023   BILITOT 0.6 10/24/2023   ALKPHOS 62 10/24/2023   AST 20 10/24/2023  ALT 17 10/24/2023   PROT 7.5 10/24/2023   ALBUMIN 4.4 10/24/2023   CALCIUM 9.6 10/24/2023   ANIONGAP 8 09/03/2022   EGFR 84 10/24/2023   Lab Results  Component Value Date   CHOL 116 10/24/2023   Lab Results  Component Value Date   HDL 43 10/24/2023   Lab Results  Component Value Date   LDLCALC 58 10/24/2023   Lab Results  Component Value Date   TRIG 75 10/24/2023   Lab Results  Component Value Date   CHOLHDL 2.7 10/24/2023   Lab Results  Component Value  Date   HGBA1C 6.1 (H) 10/24/2023      Assessment & Plan:   Problem List Items Addressed This Visit       Cardiovascular and Mediastinum   HTN (hypertension) (Chronic)   BP Readings from Last 1 Encounters:  02/28/24 95/62   Well-controlled with Enalapril, Chlorthalidone and Metoprolol Counseled for compliance with the medications Advised DASH diet and moderate exercise/walking, at least 150 mins/week      Relevant Medications   enalapril (VASOTEC) 20 MG tablet   metoprolol tartrate (LOPRESSOR) 25 MG tablet   Other Relevant Orders   TSH   CMP14+EGFR   CBC with Differential/Platelet   PVC (premature ventricular contraction)   Followed by cardiology On metoprolol 12.5 mg BID      Relevant Medications   enalapril (VASOTEC) 20 MG tablet   metoprolol tartrate (LOPRESSOR) 25 MG tablet     Endocrine   Type 2 diabetes mellitus with morbid obesity (HCC)   Lab Results  Component Value Date   HGBA1C 6.1 (H) 10/24/2023   Well controlled Contunue Mounjaro 10 mg qw now Advised to follow diabetic diet On ACEi and statin F/u CMP and lipid panel Diabetic eye exam: Advised to follow up with Ophthalmology for diabetic eye exam      Relevant Medications   enalapril (VASOTEC) 20 MG tablet   Other Relevant Orders   Hemoglobin A1c   CMP14+EGFR   Bayer DCA Hb A1c Waived     Other   Mixed hyperlipidemia (Chronic)   On Crestor Check lipid profile      Relevant Medications   enalapril (VASOTEC) 20 MG tablet   metoprolol tartrate (LOPRESSOR) 25 MG tablet   Other Relevant Orders   Lipid panel   Gout   On Allopurinol 300 mg QD      Relevant Orders   Uric acid   Encounter for general adult medical examination with abnormal findings - Primary   Physical exam as documented. Fasting blood tests ordered today.      Vitamin D deficiency   Advised to take vitamin D 5000 IU daily      Relevant Orders   VITAMIN D 25 Hydroxy (Vit-D Deficiency, Fractures)   Prostate cancer  screening   Ordered PSA after discussing its limitations for prostate cancer screening, including false positive results leading to additional investigations.      Relevant Orders   PSA   Other Visit Diagnoses       Encounter for immunization       Relevant Orders   Pneumococcal conjugate vaccine 20-valent (Completed)        Meds ordered this encounter  Medications  . enalapril (VASOTEC) 20 MG tablet    Sig: Take 1 tablet (20 mg total) by mouth daily.    Dispense:  90 tablet    Refill:  3  . metoprolol tartrate (LOPRESSOR) 25 MG tablet    Sig: Take 0.5  tablets (12.5 mg total) by mouth 2 (two) times daily.    Dispense:  90 tablet    Refill:  3    Follow-up: Return in about 4 months (around 06/29/2024) for DM and HTN.    Anabel Halon, MD

## 2024-02-28 NOTE — Assessment & Plan Note (Signed)
On Allopurinol 300 mg QD

## 2024-02-28 NOTE — Assessment & Plan Note (Signed)
On Crestor Check lipid profile 

## 2024-02-28 NOTE — Patient Instructions (Signed)
Please continue to take medications as prescribed.  Please continue to follow low carb diet and perform moderate exercise/walking at least 150 mins/week.  Please get fasting blood tests done before the next visit. 

## 2024-02-28 NOTE — Assessment & Plan Note (Signed)
Followed by cardiology On metoprolol 12.5 mg BID

## 2024-02-28 NOTE — Assessment & Plan Note (Signed)
 BP Readings from Last 1 Encounters:  02/28/24 95/62   Well-controlled with Enalapril, Chlorthalidone and Metoprolol Counseled for compliance with the medications Advised DASH diet and moderate exercise/walking, at least 150 mins/week

## 2024-02-28 NOTE — Assessment & Plan Note (Signed)
Advised to take vitamin D 5000 IU daily 

## 2024-02-28 NOTE — Assessment & Plan Note (Signed)
Physical exam as documented. ?Fasting blood tests ordered today. ?

## 2024-02-28 NOTE — Assessment & Plan Note (Signed)
 Lab Results  Component Value Date   HGBA1C 6.1 (H) 10/24/2023   Well controlled Contunue Mounjaro 10 mg qw now Advised to follow diabetic diet On ACEi and statin F/u CMP and lipid panel Diabetic eye exam: Advised to follow up with Ophthalmology for diabetic eye exam

## 2024-02-29 ENCOUNTER — Ambulatory Visit (INDEPENDENT_AMBULATORY_CARE_PROVIDER_SITE_OTHER): Payer: Self-pay

## 2024-02-29 DIAGNOSIS — Z23 Encounter for immunization: Secondary | ICD-10-CM

## 2024-03-10 ENCOUNTER — Ambulatory Visit: Payer: BC Managed Care – PPO | Admitting: Orthopedic Surgery

## 2024-03-10 DIAGNOSIS — Z6841 Body Mass Index (BMI) 40.0 and over, adult: Secondary | ICD-10-CM

## 2024-03-10 DIAGNOSIS — Z9889 Other specified postprocedural states: Secondary | ICD-10-CM

## 2024-03-10 DIAGNOSIS — G8929 Other chronic pain: Secondary | ICD-10-CM

## 2024-03-10 DIAGNOSIS — M1712 Unilateral primary osteoarthritis, left knee: Secondary | ICD-10-CM | POA: Diagnosis not present

## 2024-03-10 DIAGNOSIS — M238X2 Other internal derangements of left knee: Secondary | ICD-10-CM

## 2024-03-10 NOTE — Progress Notes (Unsigned)
   There were no vitals taken for this visit.  There is no height or weight on file to calculate BMI.  Chief Complaint  Patient presents with   Follow-up    Recheck on left knee    No diagnosis found.  DOI/DOS/ Date:   Improved

## 2024-03-11 NOTE — Progress Notes (Signed)
   Chief Complaint  Patient presents with   Follow-up    Recheck on left knee    Encounter Diagnoses  Name Primary?   S/P arthroscopy of left knee 05/17/21 Yes   Chondral defect of condyle of left femur    Primary osteoarthritis of left knee    Chronic pain of left knee    Body mass index 40.0-44.9, adult (HCC)     DOI/DOS/ Date:   Improved  James Rivas has had arthroscopy of the left knee this was followed by injection of hyaluronic acid.  He says his knee is doing very well at this time he has minimal discomfort and it is intermittent.  He does see some swelling at times  Based on the history stated above the patient is in good condition his arthritis is well-controlled he has moderate risk of pain and from arthritis in the future but as for now he can be seen on an as-needed basis.  We can repeat HA injections if needed.  We probably should do another x-ray if his symptoms warrant.

## 2024-03-19 ENCOUNTER — Other Ambulatory Visit: Payer: Self-pay | Admitting: Internal Medicine

## 2024-03-19 DIAGNOSIS — I10 Essential (primary) hypertension: Secondary | ICD-10-CM

## 2024-04-02 ENCOUNTER — Other Ambulatory Visit: Payer: Self-pay | Admitting: Internal Medicine

## 2024-04-02 DIAGNOSIS — L84 Corns and callosities: Secondary | ICD-10-CM

## 2024-04-08 ENCOUNTER — Other Ambulatory Visit: Payer: Self-pay | Admitting: Internal Medicine

## 2024-04-08 DIAGNOSIS — I10 Essential (primary) hypertension: Secondary | ICD-10-CM

## 2024-04-23 ENCOUNTER — Ambulatory Visit: Payer: BC Managed Care – PPO | Admitting: Podiatry

## 2024-05-12 ENCOUNTER — Telehealth: Payer: Self-pay

## 2024-05-12 NOTE — Telephone Encounter (Signed)
 Copied from CRM [id number]. Topic: General - Other >> May 12, 2024 11:31 AM Tiffany S wrote: Reason for CRM: Patient stated his insurance company is sending over paperwork for weight loss medication patient asked if he can put the original A1C down so medication can be approved

## 2024-05-12 NOTE — Telephone Encounter (Signed)
 Pt states form will be faxed to the office since he changed insurance.

## 2024-05-13 ENCOUNTER — Encounter: Payer: Self-pay | Admitting: Internal Medicine

## 2024-05-13 ENCOUNTER — Telehealth: Payer: Self-pay | Admitting: Internal Medicine

## 2024-05-13 ENCOUNTER — Telehealth (INDEPENDENT_AMBULATORY_CARE_PROVIDER_SITE_OTHER): Admitting: Internal Medicine

## 2024-05-13 ENCOUNTER — Ambulatory Visit: Payer: Self-pay

## 2024-05-13 VITALS — Ht 77.0 in | Wt 350.0 lb

## 2024-05-13 DIAGNOSIS — H109 Unspecified conjunctivitis: Secondary | ICD-10-CM | POA: Diagnosis not present

## 2024-05-13 MED ORDER — OFLOXACIN 0.3 % OP SOLN: 1.0000 [drp] | Freq: Four times a day (QID) | OPHTHALMIC | 0 refills | Status: DC

## 2024-05-13 NOTE — Telephone Encounter (Signed)
 Please advise. Copied from CRM [id number]. Topic: General - Other >> May 13, 2024  4:11 PM Phil Braun wrote: Reason for CRM: Pt called and needs drs note for tomorrow. Please advise. Wife will come pick it up.

## 2024-05-13 NOTE — Progress Notes (Signed)
 Virtual Visit via Video Note   Because of James Rivas's co-morbid illnesses, he is at least at moderate risk for complications without adequate follow up.  This format is felt to be most appropriate for this patient at this time.  All issues noted in this document were discussed and addressed.  A limited physical exam was performed with this format.      Evaluation Performed:  Follow-up visit  Date:  05/13/2024   ID:  James Rivas, DOB 1971/08/22, MRN [id number]  Patient Location: Home Provider Location: Office/Clinic  Participants: Patient Location of Patient: Home Location of Provider: Telehealth Consent was obtain for visit to be over via telehealth. I verified that I am speaking with the correct person using two identifiers.  PCP:  Meldon Sport, MD   Chief Complaint: Right eye redness and discharge  History of Present Illness:    James Rivas is a 53 y.o. male who has a video visit for complaint of right eye redness, discharge and mild swelling for the last 2 days.  He has noticed whitish sticky discharge, especially in the morning when he wakes up.  Denies any foreign body sensation in the eyes.  The patient does not have symptoms concerning for COVID-19 infection (fever, chills, cough, or new shortness of breath).   Past Medical, Surgical, Social History, Allergies, and Medications have been Reviewed.  Past Medical History:  Diagnosis Date   Allergy    Arthritis    left knee   Chest pain    Chronic chest pain 06/22/2013   Diabetes mellitus without complication (HCC)    Gout    Gout    History of COVID-19 12/20/2020   HTN (hypertension)    Impaired fasting glucose    Morbid obesity (HCC)    Sleep apnea    uses a CPAP   Venous stasis    Past Surgical History:  Procedure Laterality Date   COLONOSCOPY WITH PROPOFOL  N/A 05/11/2020   Procedure: COLONOSCOPY WITH PROPOFOL ;  Surgeon: Suzette Espy, MD;  [facility];  Service: Endoscopy;  Laterality:  N/A;  2:15pm   FINGER SURGERY Left    left ring finger 2 surgeries   I & D EXTREMITY  11/29/2011   Procedure: IRRIGATION AND DEBRIDEMENT EXTREMITY;  Surgeon: Shellie Dials;  Location: [facility];  Service: Orthopedics;  Laterality: Left;  I&D Right Long and Index Fingers with Tendon Repair as Needed   KNEE ARTHROSCOPY WITH MEDIAL MENISECTOMY Left 05/17/2021   Procedure: KNEE ARTHROSCOPY WITH MICROFRACTURE;  Surgeon: Darrin Emerald, MD;  Location: [facility];  Service: Orthopedics;  Laterality: Left;   MENISECTOMY Left 05/17/2021   Procedure: PARTIAL MEDIAL MENISECTOMY;  Surgeon: Darrin Emerald, MD;  Location: [facility];  Service: Orthopedics;  Laterality: Left;     Current Meds  Medication Sig   Accu-Chek Softclix Lancets lancets Use as instructed   allopurinol  (ZYLOPRIM ) 300 MG tablet Take 1 tablet (300 mg total) by mouth daily.   blood glucose meter kit and supplies Dispense based on patient and insurance preference. Use daily to check blood sugar.. (FOR ICD-10 E10.9, E11.9).   Blood Glucose Monitoring Suppl DEVI 1 each by Does not apply route 3 (three) times daily. May substitute to any manufacturer covered by patient's insurance.   chlorthalidone  (HYGROTON ) 25 MG tablet TAKE 1 TABLET(25 MG) BY MOUTH DAILY   cyclobenzaprine  (FLEXERIL ) 5 MG tablet Take 1 tablet (5 mg total) by mouth 2 (two) times daily as  needed for muscle spasms.   enalapril  (VASOTEC ) 20 MG tablet TAKE 1 TABLET(20 MG) BY MOUTH DAILY   Glucose Blood (BLOOD GLUCOSE TEST STRIPS) STRP 1 each by In Vitro route in the morning, at noon, and at bedtime. May substitute to any manufacturer covered by patient's insurance.   glucose blood (ONETOUCH VERIO) test strip USE TO CHECK BLOOD SUGAR DAILY   HYDROcodone -acetaminophen  (NORCO) 5-325 MG tablet Take 1-2 tablets by mouth every 6 (six) hours as needed.   ibuprofen  (ADVIL ) 600 MG tablet Take 1 tablet (600 mg total) by mouth every 8 (eight) hours as needed.   Lancet Device MISC Three times  daily DX e11.65 May substitute to any manufacturer covered by patient's insurance.   loperamide  (IMODIUM ) 2 MG capsule Take 1 capsule (2 mg total) by mouth 4 (four) times daily as needed for diarrhea or loose stools.   metoprolol  tartrate (LOPRESSOR ) 25 MG tablet Take 0.5 tablets (12.5 mg total) by mouth 2 (two) times daily.   Misc. Devices KIT CPAP nose piece and heated tubing.   ofloxacin (OCUFLOX) 0.3 % ophthalmic solution Place 1 drop into the right eye 4 (four) times daily.   omeprazole  (PRILOSEC) 20 MG capsule Take 1 capsule (20 mg total) by mouth daily.   ondansetron  (ZOFRAN -ODT) 8 MG disintegrating tablet 8mg  ODT q4 hours prn nausea   Respiratory Therapy Supplies (NEBULIZER MASK ADULT/TUBING) MISC 1 each by Does not apply route at bedtime.   rosuvastatin  (CRESTOR ) 10 MG tablet Take 1 tablet (10 mg total) by mouth daily.   tirzepatide  (MOUNJARO ) 10 MG/0.5ML Pen ADMINISTER 10 MG UNDER THE SKIN 1 TIME A WEEK   Current Facility-Administered Medications for the 05/13/24 encounter (Video Visit) with Meldon Sport, MD  Medication   Sodium Hyaluronate (Viscosup) SOSY 2.5 mL   Sodium Hyaluronate (Viscosup) SOSY     Allergies:   Patient has no known allergies.   ROS:   Please see the history of present illness. All other systems reviewed and are negative.   Labs/Other Tests and Data Reviewed:    Recent Labs: 10/24/2023: ALT 17; BUN 21; Creatinine, Ser 1.06; Potassium 4.4; Sodium 141   Recent Lipid Panel Lab Results  Component Value Date/Time   CHOL 116 10/24/2023 10:33 AM   TRIG 75 10/24/2023 10:33 AM   HDL 43 10/24/2023 10:33 AM   CHOLHDL 2.7 10/24/2023 10:33 AM   CHOLHDL 4.0 09/26/2014 10:09 AM   LDLCALC 58 10/24/2023 10:33 AM    Wt Readings from Last 3 Encounters:  05/13/24 (!) 350 lb (158.8 kg)  02/28/24 (!) 355 lb (161 kg)  02/12/24 (!) 352 lb 6.4 oz (159.8 kg)     Objective:    Vital Signs:  Ht 6' 5 (1.956 m)   Wt (!) 350 lb (158.8 kg)   BMI 41.50 kg/m     VITAL SIGNS:  reviewed GEN:  no acute distress EYES:  Conjunctival erythema on right side RESPIRATORY:  normal respiratory effort, symmetric expansion NEURO:  alert and oriented x 3, no obvious focal deficit PSYCH:  normal affect  ASSESSMENT & PLAN:    Bacterial conjunctivitis of right eye Right eye redness and discharge likely due to bacterial conjunctivitis Prescribed ofloxacin eyedrops Advised to perform warm compresses as needed for mild swelling (could be hordeolum as well, difficult to assess on video visit) Advised to avoid sharing napkin, towel, etc.  I discussed the assessment and treatment plan with the patient. The patient was provided an opportunity to ask questions, and all were answered.  The patient agreed with the plan and demonstrated an understanding of the instructions.   The patient was advised to call back or seek an in-person evaluation if the symptoms worsen or if the condition fails to improve as anticipated.  The above assessment and management plan was discussed with the patient. The patient verbalized understanding of and has agreed to the management plan.   Medication Adjustments/Labs and Tests Ordered: Current medicines are reviewed at length with the patient today.  Concerns regarding medicines are outlined above.   Tests Ordered: No orders of the defined types were placed in this encounter.   Medication Changes: Meds ordered this encounter  Medications   ofloxacin (OCUFLOX) 0.3 % ophthalmic solution    Sig: Place 1 drop into the right eye 4 (four) times daily.    Dispense:  5 mL    Refill:  0     Note: This dictation was prepared with Dragon dictation along with smaller phrase technology. Similar sounding words can be transcribed inadequately or may not be corrected upon review. Any transcriptional errors that result from this process are unintentional.      Disposition:  Follow up  Signed, Meldon Sport, MD  05/13/2024 4:06 PM      [facility]

## 2024-05-13 NOTE — Telephone Encounter (Signed)
 Virtual scheduled , patient aware

## 2024-05-13 NOTE — Telephone Encounter (Signed)
 FYI Only or Action Required?: Action required by provider  Patient was last seen in primary care on 02/28/2024 by Meldon Sport, MD. Called Nurse Triage reporting Eye Drainage. Symptoms began several days ago. Interventions attempted: Nothing. Symptoms are: unchanged.  Triage Disposition: Call PCP Within 24 Hours  Patient/caregiver understands and will follow disposition?: Yes  No appts that NT able to schedule for VV with PCP. Advised wife I can scheduled for VUC but they prefer to see PCP.   Copied from CRM [id number]. Topic: Clinical - Red Word Triage >> May 13, 2024 12:39 PM Fonda T wrote: Red Word that prompted transfer to Nurse Triage: Right eye pain, swelling, runny, watery   Caller, is patient spouse, Ammon Bales (DPR verified) Answer Assessment - Initial Assessment Questions 1. EYE DISCHARGE: Is the discharge in one or both eyes? What color is it? How much is there? When did the discharge start?      R eye, redness and Sunday  2. REDNESS OF SCLERA: Is the redness in one or both eyes? When did the redness start?      Yes R eye  3. EYELIDS: Are the eyelids red or swollen? If Yes, ask: How much?      Swelling R eye  5. PAIN: Is there any pain? If Yes, ask: How bad is it? (Scale 1-10; or mild, moderate, severe)    - MILD (1-3): doesn't interfere with normal activities     - MODERATE (4-7): interferes with normal activities or awakens from sleep    - SEVERE (8-10): excruciating pain, unable to do any normal activities       Just discomfort   7. OTHER SYMPTOMS: Do you have any other symptoms? (e.g., fever, runny nose, cough)     Watery and drainage  Protocols used: Eye - Pus or Discharge-A-AH  Reason for Disposition  [1] Eye with yellow or green discharge, or eyelashes stick together AND [2] NO PCP standing order to call in antibiotic eye drops   (Exception: [place]; continue triage.)  Answer Assessment - Initial Assessment Questions 1. EYE DISCHARGE: Is the  discharge in one or both eyes? What color is it? How much is there? When did the discharge start?      R eye, redness and Sunday  2. REDNESS OF SCLERA: Is the redness in one or both eyes? When did the redness start?      Yes R eye  3. EYELIDS: Are the eyelids red or swollen? If Yes, ask: How much?      Swelling R eye  5. PAIN: Is there any pain? If Yes, ask: How bad is it? (Scale 1-10; or mild, moderate, severe)    - MILD (1-3): doesn't interfere with normal activities     - MODERATE (4-7): interferes with normal activities or awakens from sleep    - SEVERE (8-10): excruciating pain, unable to do any normal activities       Just discomfort   7. OTHER SYMPTOMS: Do you have any other symptoms? (e.g., fever, runny nose, cough)     Watery and drainage  Protocols used: Eye - Pus or Discharge-A-AH

## 2024-05-14 ENCOUNTER — Telehealth: Admitting: Family Medicine

## 2024-05-14 ENCOUNTER — Telehealth: Admitting: Internal Medicine

## 2024-05-14 ENCOUNTER — Other Ambulatory Visit: Payer: Self-pay

## 2024-05-14 NOTE — Telephone Encounter (Signed)
Work note ready for pick up at the front desk.  

## 2024-06-05 ENCOUNTER — Other Ambulatory Visit: Payer: Self-pay | Admitting: Internal Medicine

## 2024-06-05 DIAGNOSIS — G8929 Other chronic pain: Secondary | ICD-10-CM

## 2024-06-05 MED ORDER — CYCLOBENZAPRINE HCL 5 MG PO TABS: 5.0000 mg | ORAL_TABLET | Freq: Two times a day (BID) | ORAL | 1 refills | Status: AC | PRN

## 2024-06-06 ENCOUNTER — Encounter: Payer: Self-pay | Admitting: Internal Medicine

## 2024-06-06 ENCOUNTER — Telehealth: Payer: Self-pay | Admitting: Internal Medicine

## 2024-06-06 ENCOUNTER — Telehealth (INDEPENDENT_AMBULATORY_CARE_PROVIDER_SITE_OTHER): Admitting: Internal Medicine

## 2024-06-06 ENCOUNTER — Ambulatory Visit: Payer: Self-pay

## 2024-06-06 DIAGNOSIS — M545 Low back pain, unspecified: Secondary | ICD-10-CM

## 2024-06-06 DIAGNOSIS — M5442 Lumbago with sciatica, left side: Secondary | ICD-10-CM

## 2024-06-06 MED ORDER — PREDNISONE 10 MG (21) PO TBPK: ORAL_TABLET | ORAL | 0 refills | Status: DC

## 2024-06-06 NOTE — Telephone Encounter (Signed)
 Spoke with patient wife- says patient and Dr Tobie discussed being out of work for two weeks so needing a note for that. Requesting a call when ready or with any questions. Thank you

## 2024-06-06 NOTE — Telephone Encounter (Signed)
 Copied from CRM [id number]. Topic: General - Other >> Jun 06, 2024  4:05 PM Avram MATSU wrote: Reason for CRM: wife is calling because the patient needs a doctor note for 06/06/24

## 2024-06-06 NOTE — Patient Instructions (Addendum)
 Please start taking Prednisone  as prescribed.  Please take Flexeril  as needed for muscle spasms.  Please avoid heavy lifting and frequent bending.  Please use heating pad and/or back brace as needed for back pain.

## 2024-06-06 NOTE — Assessment & Plan Note (Signed)
 Likely due to muscular strain, muscle spasms are likely due to strenuous work in the heat Has to lie flat to use CPAP device, may cause paraspinal muscle stiffness Sterapred Dosepak prescribed Flexeril  as needed for back muscle spasms Simple back  exercises advised Heating pad and/or back brace as needed Can use lidocaine  patch Avoid heavy lifting and frequent bending

## 2024-06-06 NOTE — Telephone Encounter (Signed)
 FYI Only or Action Required?: FYI only for provider.  Patient was last seen in primary care on 05/13/2024 by Tobie Suzzane POUR, MD.  Called Nurse Triage reporting Back Pain.  Symptoms began a week ago.  Interventions attempted: OTC medications: ibuprofen .  Symptoms are: gradually worsening.  Triage Disposition: No disposition on file.  Patient/caregiver understands and will follow disposition?:   Instructed to go to the ER.  Copied from CRM [id number]. Topic: Clinical - Red Word Triage >> Jun 06, 2024  8:38 AM Antwanette L wrote: Red Word that prompted transfer to Nurse Triage: Patient is experiencing severe pain in his lower back.The patient can barely move or walk Reason for Disposition  [1] SEVERE back pain (e.g., excruciating) AND [2] sudden onset AND [3] age > 60 years  Answer Assessment - Initial Assessment Questions 1. ONSET: When did the pain begin? (e.g., minutes, hours, days)     Tuesday 2. LOCATION: Where does it hurt? (upper, mid or lower back)     Lower back pain 3. SEVERITY: How bad is the pain?  (e.g., Scale 1-10; mild, moderate, or severe)     Severe, can barely walk 4. PATTERN: Is the pain constant? (e.g., yes, no; constant, intermittent)      constant 5. RADIATION: Does the pain shoot into your legs or somewhere else?     yes 6. CAUSE:  What do you think is causing the back pain?      no 7. BACK OVERUSE:  Any recent lifting of heavy objects, strenuous work or exercise?     no 8. MEDICINES: What have you taken so far for the pain? (e.g., nothing, acetaminophen , NSAIDS)     Advil  9. NEUROLOGIC SYMPTOMS: Do you have any weakness, numbness, or problems with bowel/bladder control?     Last Sunday he started throwing up. 10. OTHER SYMPTOMS: Do you have any other symptoms? (e.g., fever, abdomen pain, burning with urination, blood in urine)       no 11. PREGNANCY: Is there any chance you are pregnant? When was your last menstrual period?        na  Protocols used: Back Pain-A-AH

## 2024-06-06 NOTE — Progress Notes (Signed)
 Virtual Visit via Video Note   Because of James Rivas's co-morbid illnesses, he is at least at moderate risk for complications without adequate follow up.  This format is felt to be most appropriate for this patient at this time.  All issues noted in this document were discussed and addressed.  A limited physical exam was performed with this format.      Evaluation Performed:  Follow-up visit  Date:  06/12/2024   ID:  James Rivas, DOB [birth year], MRN [id number]  Patient Location: Home Provider Location: Office/Clinic  Participants: Patient Location of Patient: Home Location of Provider: Telehealth Consent was obtain for visit to be over via telehealth. I verified that I am speaking with the correct person using two identifiers.  PCP:  Tobie Suzzane POUR, MD   Chief Complaint: Low back pain  History of Present Illness:    James Rivas is a 53 y.o. male who has a video visit for complaint of acute on chronic low back pain since 06/02/24.  He had recently changed his job, where he had to work 12-hour shift in a hot environment.  He was getting exhausted after each shift.  He had chronic low back pain, which aggravated about 4 days ago.  His pain has been constant, sharp, on the left side, radiating towards left buttock area and worse with movement.  He has tried taking Tylenol  without much relief.  Denies any recent fall or injury.  Denies any numbness or tingling of the LE.  Denies saddle anesthesia, urinary or stool incontinence.  The patient does not have symptoms concerning for COVID-19 infection (fever, chills, cough, or new shortness of breath).   Past Medical, Surgical, Social History, Allergies, and Medications have been Reviewed.  Past Medical History:  Diagnosis Date   Allergy    Arthritis    left knee   Chest pain    Chronic chest pain 06/22/2013   Diabetes mellitus without complication (HCC)    Gout    Gout    History of COVID-19 12/20/2020   HTN (hypertension)     Impaired fasting glucose    Morbid obesity (HCC)    Sleep apnea    uses a CPAP   Venous stasis    Past Surgical History:  Procedure Laterality Date   COLONOSCOPY WITH PROPOFOL  N/A 05/11/2020   Procedure: COLONOSCOPY WITH PROPOFOL ;  Surgeon: Shaaron Lamar HERO, MD;  [facility];  Service: Endoscopy;  Laterality: N/A;  2:15pm   FINGER SURGERY Left    left ring finger 2 surgeries   I & D EXTREMITY  11/29/2011   Procedure: IRRIGATION AND DEBRIDEMENT EXTREMITY;  Surgeon: Prentice LELON Pagan;  Location: [facility];  Service: Orthopedics;  Laterality: Left;  I&D Right Long and Index Fingers with Tendon Repair as Needed   KNEE ARTHROSCOPY WITH MEDIAL MENISECTOMY Left 05/17/2021   Procedure: KNEE ARTHROSCOPY WITH MICROFRACTURE;  Surgeon: Margrette Taft BRAVO, MD;  Location: [facility];  Service: Orthopedics;  Laterality: Left;   MENISECTOMY Left 05/17/2021   Procedure: PARTIAL MEDIAL MENISECTOMY;  Surgeon: Margrette Taft BRAVO, MD;  Location: [facility];  Service: Orthopedics;  Laterality: Left;     Current Meds  Medication Sig   predniSONE  (STERAPRED UNI-PAK 21 TAB) 10 MG (21) TBPK tablet Take as package instructions.   Current Facility-Administered Medications for the 06/06/24 encounter (Video Visit) with Tobie Suzzane POUR, MD  Medication   Sodium Hyaluronate (Viscosup) SOSY 2.5 mL   Sodium Hyaluronate (Viscosup) SOSY  Allergies:   Patient has no known allergies.   ROS:   Please see the history of present illness. All other systems reviewed and are negative.   Labs/Other Tests and Data Reviewed:    Recent Labs: 10/24/2023: ALT 17; BUN 21; Creatinine, Ser 1.06; Potassium 4.4; Sodium 141   Recent Lipid Panel Lab Results  Component Value Date/Time   CHOL 116 10/24/2023 10:33 AM   TRIG 75 10/24/2023 10:33 AM   HDL 43 10/24/2023 10:33 AM   CHOLHDL 2.7 10/24/2023 10:33 AM   CHOLHDL 4.0 09/26/2014 10:09 AM   LDLCALC 58 10/24/2023 10:33 AM    Wt Readings from Last 3 Encounters:  05/13/24 (!)  350 lb (158.8 kg)  02/28/24 (!) 355 lb (161 kg)  02/12/24 (!) 352 lb 6.4 oz (159.8 kg)     Objective:    Vital Signs:  There were no vitals taken for this visit.   VITAL SIGNS:  reviewed GEN:  no acute distress EYES:  sclerae anicteric, EOMI - Extraocular Movements Intact RESPIRATORY:  normal respiratory effort, symmetric expansion NEURO:  alert and oriented x 3, no obvious focal deficit PSYCH:  normal affect  ASSESSMENT & PLAN:    Acute left-sided low back pain with left-sided sciatica Likely due to muscular strain, muscle spasms are likely due to strenuous work in the heat Has to lie flat to use CPAP device, may cause paraspinal muscle stiffness Sterapred Dosepak prescribed Flexeril  as needed for back muscle spasms Simple back  exercises advised Heating pad and/or back brace as needed Can use lidocaine  patch Avoid heavy lifting and frequent bending  I discussed the assessment and treatment plan with the patient. The patient was provided an opportunity to ask questions, and all were answered. The patient agreed with the plan and demonstrated an understanding of the instructions.   The patient was advised to call back or seek an in-person evaluation if the symptoms worsen or if the condition fails to improve as anticipated.  The above assessment and management plan was discussed with the patient. The patient verbalized understanding of and has agreed to the management plan.   Medication Adjustments/Labs and Tests Ordered: Current medicines are reviewed at length with the patient today.  Concerns regarding medicines are outlined above.   Tests Ordered: No orders of the defined types were placed in this encounter.   Medication Changes: Meds ordered this encounter  Medications   predniSONE  (STERAPRED UNI-PAK 21 TAB) 10 MG (21) TBPK tablet    Sig: Take as package instructions.    Dispense:  1 each    Refill:  0     Note: This dictation was prepared with Dragon dictation  along with smaller phrase technology. Similar sounding words can be transcribed inadequately or may not be corrected upon review. Any transcriptional errors that result from this process are unintentional.      Disposition:  Follow up  Signed, Suzzane MARLA Blanch, MD  06/12/2024 5:26 PM     [facility]

## 2024-06-09 ENCOUNTER — Other Ambulatory Visit: Payer: Self-pay

## 2024-06-09 NOTE — Telephone Encounter (Signed)
 Work note ready for pick up at the front desk pt has been informed its ready for pick up.

## 2024-06-09 NOTE — Telephone Encounter (Signed)
 Patient is calling to follow up on the work note. Patient reporting that it is important that he receives the work note today.

## 2024-06-11 ENCOUNTER — Telehealth: Payer: Self-pay | Admitting: Internal Medicine

## 2024-06-11 ENCOUNTER — Telehealth: Payer: Self-pay

## 2024-06-11 NOTE — Telephone Encounter (Signed)
 [employer] form  Noted Copied Sleeved  Placed original in provider box Copy of form placed front desk folder

## 2024-06-11 NOTE — Telephone Encounter (Signed)
 Copied from CRM [id number]. Topic: General - Other >> Jun 11, 2024  2:13 PM Larissa S wrote: Reason for CRM: Marina  with [employer] requesting to speak with nurse regarding patient's disability claim Callback [phone]

## 2024-06-11 NOTE — Telephone Encounter (Signed)
New forms received.

## 2024-06-11 NOTE — Telephone Encounter (Signed)
 Returned call was unable to get through.

## 2024-06-19 ENCOUNTER — Ambulatory Visit (INDEPENDENT_AMBULATORY_CARE_PROVIDER_SITE_OTHER): Payer: Self-pay | Admitting: Internal Medicine

## 2024-06-19 ENCOUNTER — Other Ambulatory Visit: Payer: Self-pay | Admitting: Internal Medicine

## 2024-06-19 ENCOUNTER — Ambulatory Visit (HOSPITAL_COMMUNITY)
Admission: RE | Admit: 2024-06-19 | Discharge: 2024-06-19 | Disposition: A | Discharge status: Home / Self Care | Source: Ambulatory Visit | Attending: Internal Medicine | Admitting: Internal Medicine

## 2024-06-19 ENCOUNTER — Encounter: Payer: Self-pay | Admitting: Internal Medicine

## 2024-06-19 ENCOUNTER — Ambulatory Visit: Admitting: Cardiovascular Disease

## 2024-06-19 VITALS — BP 108/72 | HR 76 | Ht 77.0 in | Wt 358.0 lb

## 2024-06-19 DIAGNOSIS — M5442 Lumbago with sciatica, left side: Secondary | ICD-10-CM | POA: Insufficient documentation

## 2024-06-19 MED ORDER — CELECOXIB 100 MG PO CAPS: 100.0000 mg | ORAL_CAPSULE | Freq: Two times a day (BID) | ORAL | 1 refills | Status: AC | PRN

## 2024-06-19 NOTE — Progress Notes (Signed)
 Acute Office Visit  Subjective:    Patient ID: James Rivas, male    DOB: [birth year], 53 y.o.   MRN: [id number]  Chief Complaint  Patient presents with   Back Pain    Radiating back pain     HPI Patient is in today for complaint of acute on chronic low back pain since 06/02/24.  He had recently changed his job, where he had to work 12-hour shift in a hot environment.  He was getting exhausted after each shift.  He had chronic low back pain, which aggravated around 06/06/24.  His pain has been constant, sharp, on the left side, radiating towards left buttock area and worse with movement.  He has completed oral prednisone  tapering course with mild relief.  He still has recurrent severe low back pain upon certain movements.  He has not been able to return to work since 06/06/24. Denies any recent fall or injury.  Denies any numbness or tingling of the LE.  Denies saddle anesthesia, urinary or stool incontinence.   Past Medical History:  Diagnosis Date   Allergy    Arthritis    left knee   Chest pain    Chronic chest pain 06/22/2013   Diabetes mellitus without complication (HCC)    Gout    Gout    History of COVID-19 12/20/2020   HTN (hypertension)    Impaired fasting glucose    Morbid obesity (HCC)    Sleep apnea    uses a CPAP   Venous stasis     Past Surgical History:  Procedure Laterality Date   COLONOSCOPY WITH PROPOFOL  N/A 05/11/2020   Procedure: COLONOSCOPY WITH PROPOFOL ;  Surgeon: Shaaron Lamar HERO, MD;  Location: [facility];  Service: Endoscopy;  Laterality: N/A;  2:15pm   FINGER SURGERY Left    left ring finger 2 surgeries   I & D EXTREMITY  11/29/2011   Procedure: IRRIGATION AND DEBRIDEMENT EXTREMITY;  Surgeon: Prentice LELON Pagan;  Location: [facility];  Service: Orthopedics;  Laterality: Left;  I&D Right Long and Index Fingers with Tendon Repair as Needed   KNEE ARTHROSCOPY WITH MEDIAL MENISECTOMY Left 05/17/2021   Procedure: KNEE ARTHROSCOPY WITH MICROFRACTURE;  Surgeon:  Margrette Taft BRAVO, MD;  Location: [facility];  Service: Orthopedics;  Laterality: Left;   MENISECTOMY Left 05/17/2021   Procedure: PARTIAL MEDIAL MENISECTOMY;  Surgeon: Margrette Taft BRAVO, MD;  Location: [facility];  Service: Orthopedics;  Laterality: Left;    Family History  Problem Relation Age of Onset   Diabetes Father    Diabetes Paternal Aunt    Colon cancer Neg Hx     Social History   Socioeconomic History   Marital status: Married    Spouse name: Not on file   Number of children: 3   Years of education: Not on file   Highest education level: Not on file  Occupational History   Occupation: [employer]    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 1 beer or a glass of wine a couple times a week.    Drug use: No   Sexual activity: Never  Other Topics Concern   Not on file  Social History Narrative   Not on file   Social Drivers of Health   Financial Resource Strain: Not on file  Food Insecurity: Not on file  Transportation Needs: Not on file  Physical Activity: Not on file  Stress: Not on file  Social Connections: Unknown (04/11/2022)   Received from [facility]   Social Network    Social Network: Not on file  Intimate Partner Violence: Unknown (03/03/2022)   Received from [facility]   HITS    Physically Hurt: Not on file    Insult or Talk Down To: Not on file    Threaten Physical Harm: Not on file    Scream or Curse: Not on file    Outpatient Medications Prior to Visit  Medication Sig Dispense Refill   Accu-Chek Softclix Lancets lancets Use as instructed 100 each 12   allopurinol  (ZYLOPRIM ) 300 MG tablet Take 1 tablet (300 mg total) by mouth daily. 90 tablet 3   blood glucose meter kit and supplies Dispense based on patient and insurance preference. Use daily to check blood sugar.. (FOR ICD-10 E10.9,  E11.9). 1 each 0   Blood Glucose Monitoring Suppl DEVI 1 each by Does not apply route 3 (three) times daily. May substitute to any manufacturer covered by patient's insurance. 1 each 0   chlorthalidone  (HYGROTON ) 25 MG tablet TAKE 1 TABLET(25 MG) BY MOUTH DAILY 90 tablet 1   cyclobenzaprine  (FLEXERIL ) 5 MG tablet Take 1 tablet (5 mg total) by mouth 2 (two) times daily as needed for muscle spasms. 30 tablet 1   enalapril  (VASOTEC ) 20 MG tablet TAKE 1 TABLET(20 MG) BY MOUTH DAILY 90 tablet 3   Glucose Blood (BLOOD GLUCOSE TEST STRIPS) STRP 1 each by In Vitro route in the morning, at noon, and at bedtime. May substitute to any manufacturer covered by patient's insurance. 100 strip 5   glucose blood (ONETOUCH VERIO) test strip USE TO CHECK BLOOD SUGAR DAILY 100 strip 1   Lancet Device MISC Three times daily DX e11.65 May substitute to any manufacturer covered by patient's insurance. 1 each 0   loperamide  (IMODIUM ) 2 MG capsule Take 1 capsule (2 mg total) by mouth 4 (four) times daily as needed for diarrhea or loose stools. 12 capsule 0   metoprolol  tartrate (LOPRESSOR ) 25 MG tablet Take 0.5 tablets (12.5 mg total) by mouth 2 (two) times daily. 90 tablet 3   Misc. Devices KIT CPAP nose piece and heated tubing. 1 kit 0   omeprazole  (PRILOSEC) 20 MG capsule Take 1 capsule (20 mg total) by mouth daily. 30 capsule 5   ondansetron  (ZOFRAN -ODT) 8 MG disintegrating tablet 8mg  ODT q4 hours prn nausea 10 tablet 0   Respiratory Therapy Supplies (NEBULIZER MASK ADULT/TUBING) MISC 1 each by Does not apply route at bedtime. 1 each 11   rosuvastatin  (CRESTOR ) 10 MG tablet Take 1 tablet (10 mg total) by mouth daily. 90 tablet 3   tirzepatide  (MOUNJARO ) 10 MG/0.5ML Pen ADMINISTER 10 MG UNDER THE SKIN 1 TIME A WEEK 2 mL 5   HYDROcodone -acetaminophen  (NORCO) 5-325 MG tablet Take 1-2 tablets by mouth every 6 (six) hours as needed. 10 tablet 0   ibuprofen  (ADVIL ) 600 MG tablet Take 1 tablet (600 mg total) by mouth every 8  (eight) hours as needed. 30 tablet 0   ofloxacin  (OCUFLOX ) 0.3 % ophthalmic solution Place 1 drop into the right eye 4 (four) times daily. 5 mL 0   predniSONE  (STERAPRED UNI-PAK 21 TAB) 10 MG (21) TBPK tablet Take as package instructions. 1 each 0   Facility-Administered Medications Prior to Visit  Medication Dose Route Frequency Provider Last Rate Last  Admin   Sodium Hyaluronate (Viscosup) SOSY 2.5 mL  2.5 mL Intra-articular Once Harrison, Stanley E, MD       Sodium Hyaluronate (Viscosup) SOSY   Intra-articular Once Margrette Taft BRAVO, MD        No Known Allergies  Review of Systems  Constitutional:  Negative for chills and fever.  HENT:  Negative for congestion and sore throat.   Eyes:  Negative for pain and discharge.  Respiratory:  Negative for cough and shortness of breath.   Cardiovascular:  Negative for chest pain and palpitations.  Gastrointestinal:  Negative for diarrhea, nausea and vomiting.  Endocrine: Negative for polydipsia and polyuria.  Genitourinary:  Negative for dysuria and hematuria.  Musculoskeletal:  Positive for arthralgias and back pain. Negative for neck pain and neck stiffness.  Skin:  Negative for rash.  Neurological:  Positive for weakness (RLE) and numbness. Negative for dizziness and headaches.  Psychiatric/Behavioral:  Negative for agitation and behavioral problems.        Objective:    Physical Exam Vitals reviewed.  Constitutional:      General: He is not in acute distress.    Appearance: He is obese. He is not diaphoretic.  HENT:     Head: Normocephalic and atraumatic.     Nose: Nose normal.     Mouth/Throat:     Mouth: Mucous membranes are moist.  Eyes:     General: No scleral icterus.    Extraocular Movements: Extraocular movements intact.  Cardiovascular:     Rate and Rhythm: Normal rate and regular rhythm.     Heart sounds: Normal heart sounds. No murmur heard. Pulmonary:     Breath sounds: Normal breath sounds. No wheezing or  rales.  Musculoskeletal:     Cervical back: Neck supple. No tenderness.     Lumbar back: Tenderness present. Decreased range of motion. Positive right straight leg raise test. Negative left straight leg raise test.     Right lower leg: Edema (1+) present.     Left lower leg: Edema (1+) present.  Skin:    General: Skin is warm.     Findings: Rash (stasis dermatitis b/l) present.  Neurological:     General: No focal deficit present.     Mental Status: He is alert and oriented to person, place, and time.     Cranial Nerves: No cranial nerve deficit.     Sensory: No sensory deficit.     Motor: No weakness.  Psychiatric:        Mood and Affect: Mood normal.        Behavior: Behavior normal.     BP 108/72   Pulse 76   Ht 6' 5 (1.956 m)   Wt (!) 358 lb (162.4 kg)   SpO2 94%   BMI 42.45 kg/m  Wt Readings from Last 3 Encounters:  06/19/24 (!) 358 lb (162.4 kg)  05/13/24 (!) 350 lb (158.8 kg)  02/28/24 (!) 355 lb (161 kg)        Assessment & Plan:   Problem List Items Addressed This Visit       Nervous and Auditory   Acute left-sided low back pain with left-sided sciatica - Primary   Likely due to muscular strain, muscle spasms are likely due to strenuous work in the heat Has to lie flat to use CPAP device, may cause paraspinal muscle stiffness Sterapred Dosepak completed recently Prescribed Celebrex  100 mg twice daily as needed Flexeril  as needed for back muscle spasms Simple back  exercises advised Heating pad and/or back brace as needed Can use lidocaine  patch Avoid heavy lifting and frequent bending Check x-ray of lumbar spine Refer to orthopedic surgery FMLA paperwork provided to extend leave for an additional 2 weeks      Relevant Medications   celecoxib  (CELEBREX ) 100 MG capsule   Other Relevant Orders   Ambulatory referral to Orthopedic Surgery   DG Lumbar Spine Complete     Meds ordered this encounter  Medications   celecoxib  (CELEBREX ) 100 MG  capsule    Sig: Take 1 capsule (100 mg total) by mouth 2 (two) times daily as needed for mild pain (pain score 1-3) or moderate pain (pain score 4-6).    Dispense:  60 capsule    Refill:  1     Bing Duffey MARLA Blanch, MD

## 2024-06-19 NOTE — Patient Instructions (Signed)
 Please take Celebrex  as needed for back pain.  Please use heating pad and/or back brace as needed for back pain.  Please get an x-ray of lumbar spine done at [facility].

## 2024-06-19 NOTE — Assessment & Plan Note (Signed)
 Likely due to muscular strain, muscle spasms are likely due to strenuous work in the heat Has to lie flat to use CPAP device, may cause paraspinal muscle stiffness Sterapred Dosepak completed recently Prescribed Celebrex  100 mg twice daily as needed Flexeril  as needed for back muscle spasms Simple back  exercises advised Heating pad and/or back brace as needed Can use lidocaine  patch Avoid heavy lifting and frequent bending Check x-ray of lumbar spine Refer to orthopedic surgery FMLA paperwork provided to extend leave for an additional 2 weeks

## 2024-06-20 ENCOUNTER — Telehealth: Payer: Self-pay | Admitting: Internal Medicine

## 2024-06-20 NOTE — Telephone Encounter (Signed)
 Disability Form-work  Noted Copied Sleeved Original placed in provider box Copy placed front desk folder

## 2024-06-23 NOTE — Telephone Encounter (Signed)
 Faxed on 06/20/2024, copy laid on [place] desk

## 2024-06-29 ENCOUNTER — Encounter: Payer: Self-pay | Admitting: Internal Medicine

## 2024-06-30 ENCOUNTER — Encounter: Payer: Self-pay | Admitting: Internal Medicine

## 2024-06-30 DIAGNOSIS — M51369 Other intervertebral disc degeneration, lumbar region without mention of lumbar back pain or lower extremity pain: Secondary | ICD-10-CM | POA: Insufficient documentation

## 2024-07-01 ENCOUNTER — Encounter: Payer: Self-pay | Admitting: Internal Medicine

## 2024-07-01 ENCOUNTER — Ambulatory Visit: Payer: Self-pay | Admitting: Internal Medicine

## 2024-07-01 VITALS — BP 116/78 | HR 73 | Ht 77.0 in | Wt 359.0 lb

## 2024-07-01 DIAGNOSIS — E782 Mixed hyperlipidemia: Secondary | ICD-10-CM | POA: Diagnosis not present

## 2024-07-01 DIAGNOSIS — M51362 Other intervertebral disc degeneration, lumbar region with discogenic back pain and lower extremity pain: Secondary | ICD-10-CM | POA: Diagnosis not present

## 2024-07-01 DIAGNOSIS — I10 Essential (primary) hypertension: Secondary | ICD-10-CM | POA: Diagnosis not present

## 2024-07-01 DIAGNOSIS — Z7985 Long-term (current) use of injectable non-insulin antidiabetic drugs: Secondary | ICD-10-CM

## 2024-07-01 DIAGNOSIS — E1169 Type 2 diabetes mellitus with other specified complication: Secondary | ICD-10-CM | POA: Diagnosis not present

## 2024-07-01 NOTE — Assessment & Plan Note (Signed)
 BP Readings from Last 1 Encounters:  07/01/24 116/78   Well-controlled with Enalapril , Chlorthalidone  and Metoprolol  Counseled for compliance with the medications Advised DASH diet and moderate exercise/walking, at least 150 mins/week

## 2024-07-01 NOTE — Patient Instructions (Signed)
Please continue to take medications as prescribed.  Please continue to follow low carb diet and perform moderate exercise/walking at least 150 mins/week.  Please get fasting blood tests done before the next visit. 

## 2024-07-01 NOTE — Progress Notes (Unsigned)
 Established Patient Office Visit  Subjective:  Patient ID: James Rivas, male    DOB: 10/02/1971  Age: 53 y.o. MRN: [id number]  CC:  Chief Complaint  Patient presents with   Hypertension    Four month follow up   Diabetes    Four month follow up    HPI James Rivas is a 53 y.o. male with past medical history of HTN, type II DM with morbid obesity, OSA, gout and HLD who presents for chronic medical conditions.  Type II DM: He is tolerating Mounjaro  10 mg qw. Reports that he has struggled with controlling his food intake at times. He is trying to follow small, frequent meals.  He has lost about 6 lbs from the last visit. He denies any polyuria or polyphagia currently.  Denies any recurrent episode of abdominal pain, nausea or vomiting since the last visit.  HTN: BP is well-controlled. Takes medications regularly. Patient denies headache, dizziness, chest pain, dyspnea or palpitations.  Left knee pain: He has had left knee arthroscopy and had Supartz injection. He has noted improvement in left knee pain recently.  His right thigh pain/burning has improved in the last week.    Past Medical History:  Diagnosis Date   Allergy    Arthritis    left knee   Chest pain    Chronic chest pain 06/22/2013   Diabetes mellitus without complication (HCC)    Gout    Gout    History of COVID-19 12/20/2020   HTN (hypertension)    Impaired fasting glucose    Morbid obesity (HCC)    Sleep apnea    uses a CPAP   Venous stasis     Past Surgical History:  Procedure Laterality Date   COLONOSCOPY WITH PROPOFOL  N/A 05/11/2020   Procedure: COLONOSCOPY WITH PROPOFOL ;  Surgeon: Shaaron Lamar HERO, MD;  [facility];  Service: Endoscopy;  Laterality: N/A;  2:15pm   FINGER SURGERY Left    left ring finger 2 surgeries   I & D EXTREMITY  11/29/2011   Procedure: IRRIGATION AND DEBRIDEMENT EXTREMITY;  Surgeon: Prentice LELON Pagan;  Location: [facility] OR;  Service: Orthopedics;  Laterality: Left;  I&D Right  Long and Index Fingers with Tendon Repair as Needed   KNEE ARTHROSCOPY WITH MEDIAL MENISECTOMY Left 05/17/2021   Procedure: KNEE ARTHROSCOPY WITH MICROFRACTURE;  Surgeon: Margrette Taft BRAVO, MD;  Location: [facility];  Service: Orthopedics;  Laterality: Left;   MENISECTOMY Left 05/17/2021   Procedure: PARTIAL MEDIAL MENISECTOMY;  Surgeon: Margrette Taft BRAVO, MD;  Location: [facility];  Service: Orthopedics;  Laterality: Left;    Family History  Problem Relation Age of Onset   Diabetes Father    Diabetes Paternal Aunt    Colon cancer Neg Hx     Social History   Socioeconomic History   Marital status: Married    Spouse name: Not on file   Number of children: 3   Years of education: Not on file   Highest education level: Not on file  Occupational History   Occupation: [employer]    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 1 beer or a glass of wine a couple times a week.    Drug use: No   Sexual activity: Never  Other Topics Concern   Not  on file  Social History Narrative   Not on file   Social Drivers of Health   Financial Resource Strain: Not on file  Food Insecurity: Not on file  Transportation Needs: Not on file  Physical Activity: Not on file  Stress: Not on file  Social Connections: Unknown (04/11/2022)   Received from [facility]   Social Network    Social Network: Not on file  Intimate Partner Violence: Unknown (03/03/2022)   Received from [facility]   HITS    Physically Hurt: Not on file    Insult or Talk Down To: Not on file    Threaten Physical Harm: Not on file    Scream or Curse: Not on file    Outpatient Medications Prior to Visit  Medication Sig Dispense Refill   Accu-Chek Softclix Lancets lancets Use as instructed 100 each 12   allopurinol  (ZYLOPRIM ) 300 MG tablet Take 1 tablet (300 mg total)  by mouth daily. 90 tablet 3   blood glucose meter kit and supplies Dispense based on patient and insurance preference. Use daily to check blood sugar.. (FOR ICD-10 E10.9, E11.9). 1 each 0   Blood Glucose Monitoring Suppl DEVI 1 each by Does not apply route 3 (three) times daily. May substitute to any manufacturer covered by patient's insurance. 1 each 0   celecoxib  (CELEBREX ) 100 MG capsule Take 1 capsule (100 mg total) by mouth 2 (two) times daily as needed for mild pain (pain score 1-3) or moderate pain (pain score 4-6). 60 capsule 1   chlorthalidone  (HYGROTON ) 25 MG tablet TAKE 1 TABLET(25 MG) BY MOUTH DAILY 90 tablet 1   cyclobenzaprine  (FLEXERIL ) 5 MG tablet Take 1 tablet (5 mg total) by mouth 2 (two) times daily as needed for muscle spasms. 30 tablet 1   enalapril  (VASOTEC ) 20 MG tablet TAKE 1 TABLET(20 MG) BY MOUTH DAILY 90 tablet 3   Glucose Blood (BLOOD GLUCOSE TEST STRIPS) STRP 1 each by In Vitro route in the morning, at noon, and at bedtime. May substitute to any manufacturer covered by patient's insurance. 100 strip 5   glucose blood (ONETOUCH VERIO) test strip USE TO CHECK BLOOD SUGAR DAILY 100 strip 1   Lancet Device MISC Three times daily DX e11.65 May substitute to any manufacturer covered by patient's insurance. 1 each 0   loperamide  (IMODIUM ) 2 MG capsule Take 1 capsule (2 mg total) by mouth 4 (four) times daily as needed for diarrhea or loose stools. 12 capsule 0   metoprolol  tartrate (LOPRESSOR ) 25 MG tablet Take 0.5 tablets (12.5 mg total) by mouth 2 (two) times daily. 90 tablet 3   Misc. Devices KIT CPAP nose piece and heated tubing. 1 kit 0   omeprazole  (PRILOSEC) 20 MG capsule Take 1 capsule (20 mg total) by mouth daily. 30 capsule 5   ondansetron  (ZOFRAN -ODT) 8 MG disintegrating tablet 8mg  ODT q4 hours prn nausea 10 tablet 0   Respiratory Therapy Supplies (NEBULIZER MASK ADULT/TUBING) MISC 1 each by Does not apply route at bedtime. 1 each 11   rosuvastatin  (CRESTOR ) 10 MG  tablet Take 1 tablet (10 mg total) by mouth daily. 90 tablet 3   tirzepatide  (MOUNJARO ) 10 MG/0.5ML Pen ADMINISTER 10 MG UNDER THE SKIN 1 TIME A WEEK 2 mL 5   Facility-Administered Medications Prior to Visit  Medication Dose Route Frequency Provider Last Rate Last Admin   Sodium Hyaluronate (Viscosup) SOSY 2.5 mL  2.5 mL Intra-articular Once Margrette Taft BRAVO, MD  Sodium Hyaluronate (Viscosup) SOSY   Intra-articular Once Margrette Taft BRAVO, MD        No Known Allergies  ROS Review of Systems  Constitutional:  Negative for chills and fever.  HENT:  Negative for congestion and sore throat.   Eyes:  Negative for pain and discharge.  Respiratory:  Negative for cough and shortness of breath.   Cardiovascular:  Negative for chest pain and palpitations.  Gastrointestinal:  Negative for diarrhea, nausea and vomiting.  Endocrine: Negative for polydipsia and polyuria.  Genitourinary:  Negative for dysuria and hematuria.  Musculoskeletal:  Positive for arthralgias and back pain. Negative for neck pain and neck stiffness.  Skin:  Negative for rash.  Neurological:  Negative for dizziness, weakness, numbness and headaches.  Psychiatric/Behavioral:  Negative for agitation and behavioral problems.       Objective:    Physical Exam Vitals reviewed.  Constitutional:      General: He is not in acute distress.    Appearance: He is obese. He is not diaphoretic.  HENT:     Head: Normocephalic and atraumatic.     Nose: Nose normal.     Mouth/Throat:     Mouth: Mucous membranes are moist.  Eyes:     General: No scleral icterus.    Extraocular Movements: Extraocular movements intact.  Cardiovascular:     Rate and Rhythm: Normal rate and regular rhythm.     Heart sounds: Normal heart sounds. No murmur heard. Pulmonary:     Breath sounds: Normal breath sounds. No wheezing or rales.  Abdominal:     Palpations: Abdomen is soft.     Tenderness: There is no abdominal tenderness.   Musculoskeletal:     Cervical back: Neck supple. No tenderness.     Right lower leg: Edema (1+) present.     Left lower leg: Edema (1+) present.  Skin:    General: Skin is warm.     Findings: Rash (stasis dermatitis b/l) present.  Neurological:     General: No focal deficit present.     Mental Status: He is alert and oriented to person, place, and time.     Cranial Nerves: No cranial nerve deficit.     Sensory: No sensory deficit.     Motor: No weakness.  Psychiatric:        Mood and Affect: Mood normal.        Behavior: Behavior normal.     BP (!) 142/80   Pulse 73   Ht 6' 5 (1.956 m)   Wt (!) 359 lb (162.8 kg)   SpO2 97%   BMI 42.57 kg/m  Wt Readings from Last 3 Encounters:  07/01/24 (!) 359 lb (162.8 kg)  06/19/24 (!) 358 lb (162.4 kg)  05/13/24 (!) 350 lb (158.8 kg)    Lab Results  Component Value Date   TSH 1.640 02/16/2023   Lab Results  Component Value Date   WBC 6.1 02/16/2023   HGB 15.2 02/16/2023   HCT 45.7 02/16/2023   MCV 80 02/16/2023   PLT 206 02/16/2023   Lab Results  Component Value Date   NA 141 10/24/2023   K 4.4 10/24/2023   CO2 25 10/24/2023   GLUCOSE 80 10/24/2023   BUN 21 10/24/2023   CREATININE 1.06 10/24/2023   BILITOT 0.6 10/24/2023   ALKPHOS 62 10/24/2023   AST 20 10/24/2023   ALT 17 10/24/2023   PROT 7.5 10/24/2023   ALBUMIN 4.4 10/24/2023   CALCIUM  9.6 10/24/2023   ANIONGAP  8 09/03/2022   EGFR 84 10/24/2023   Lab Results  Component Value Date   CHOL 116 10/24/2023   Lab Results  Component Value Date   HDL 43 10/24/2023   Lab Results  Component Value Date   LDLCALC 58 10/24/2023   Lab Results  Component Value Date   TRIG 75 10/24/2023   Lab Results  Component Value Date   CHOLHDL 2.7 10/24/2023   Lab Results  Component Value Date   HGBA1C 5.5 02/28/2024      Assessment & Plan:   Problem List Items Addressed This Visit   None     No orders of the defined types were placed in this  encounter.   Follow-up: No follow-ups on file.    Suzzane MARLA Blanch, MD

## 2024-07-01 NOTE — Telephone Encounter (Signed)
 Paperwork refaxed with conformation

## 2024-07-02 ENCOUNTER — Telehealth: Payer: Self-pay | Admitting: Internal Medicine

## 2024-07-02 NOTE — Telephone Encounter (Signed)
 Patient called needs the last office note 07.24.2025 and 08.05.2025 needs to be  fax to [employer], fax # [phone].

## 2024-07-02 NOTE — Telephone Encounter (Signed)
 Printed and ready to fax

## 2024-07-02 NOTE — Telephone Encounter (Signed)
 [employer] Noted Copied Scanned Original in provider box Copy at front desk

## 2024-07-02 NOTE — Assessment & Plan Note (Signed)
On Crestor Check lipid profile 

## 2024-07-02 NOTE — Telephone Encounter (Signed)
 Faxed

## 2024-07-02 NOTE — Assessment & Plan Note (Signed)
 Lab Results  Component Value Date   HGBA1C 5.5 02/28/2024   Well controlled Contunue Mounjaro  10 mg qw now Advised to follow diabetic diet On ACEi and statin F/u CMP and lipid panel Diabetic eye exam: Advised to follow up with Ophthalmology for diabetic eye exam

## 2024-07-02 NOTE — Telephone Encounter (Signed)
 See other message in [place]   Copied from CRM [id number]. Topic: General - Other >> Jul 02, 2024 10:03 AM Avram MATSU wrote: Reason for CRM: July 24th summary report to be faxed over to [employer],  Fax::[phone]  I did a warm transfer to the cal

## 2024-07-02 NOTE — Assessment & Plan Note (Signed)
 Recent x-ray of lumbar spine reviewed, showed degenerative changes with marginal osteophytes L3-4 and L4-5. Referred to orthopedic surgeon Advised to perform simple back exercises at home Avoid heavy lifting and frequent bending Unable to stand for more than 2 hours, especially in extreme heat conditions - paperwork submitted

## 2024-07-02 NOTE — Telephone Encounter (Signed)
 Done

## 2024-07-03 ENCOUNTER — Encounter: Payer: Self-pay | Admitting: Orthopaedic Surgery

## 2024-07-03 ENCOUNTER — Ambulatory Visit (INDEPENDENT_AMBULATORY_CARE_PROVIDER_SITE_OTHER): Admitting: Orthopaedic Surgery

## 2024-07-03 VITALS — BP 115/71 | HR 87 | Ht 77.0 in | Wt 358.0 lb

## 2024-07-03 DIAGNOSIS — M79604 Pain in right leg: Secondary | ICD-10-CM

## 2024-07-03 DIAGNOSIS — M545 Low back pain, unspecified: Secondary | ICD-10-CM | POA: Diagnosis not present

## 2024-07-03 NOTE — Progress Notes (Signed)
 Subjective:    Patient ID: James Rivas, male    DOB: 31-Dec-1970, 53 y.o.   MRN: [id number]  HPI He has had marked lower back pain since 06-02-24.  It began suddenly.  He was trying to get out of bed that day and had a very difficult time and a lot of pain.  He does not know of why it happened.  He has no trauma or untoward event.  He contacted his family doctor on 06-06-24 and again 06-19-24.  He has had anti spasm medicine, Celebrex  and rest but still has pain.  He is not getting any better.  The notes of his visit states left sided sciatica but today he tells me it is on the right.  He is concerned he is not improving.  He is still out of work. He has no bowel or bladder problems and no weakness.  Range of motion is very limited and he cannot sit for long or stand for long.  He cannot sleep on his side because of pain.  XRays showed:  IMPRESSION: Degenerative changes. No acute osseous abnormalities.  I have reviewed his family doctor notes and his X-rays.  I have independently reviewed and interpreted x-rays of this patient done at another site by another physician or qualified health professional.  He has had blood sugar problems and cannot take steroids.  He has reflux and cannot take NSAIDs.  Review of Systems  Constitutional:  Positive for activity change.  Musculoskeletal:  Positive for arthralgias and back pain.  All other systems reviewed and are negative. For Review of Systems, all other systems reviewed and are negative.  The following is a summary of the past history medically, past history surgically, known current medicines, social history and family history.  This information is gathered electronically by the computer from prior information and documentation.  I review this each visit and have found including this information at this point in the chart is beneficial and informative.   Past Medical History:  Diagnosis Date   Allergy    Arthritis    left knee   Chest pain     Chronic chest pain 06/22/2013   Diabetes mellitus without complication (HCC)    Gout    Gout    History of COVID-19 12/20/2020   HTN (hypertension)    Impaired fasting glucose    Morbid obesity (HCC)    Sleep apnea    uses a CPAP   Venous stasis     Past Surgical History:  Procedure Laterality Date   COLONOSCOPY WITH PROPOFOL  N/A 05/11/2020   Procedure: COLONOSCOPY WITH PROPOFOL ;  Surgeon: Shaaron Lamar HERO, MD;  Location: [facility];  Service: Endoscopy;  Laterality: N/A;  2:15pm   FINGER SURGERY Left    left ring finger 2 surgeries   I & D EXTREMITY  11/29/2011   Procedure: IRRIGATION AND DEBRIDEMENT EXTREMITY;  Surgeon: Prentice LELON Pagan;  Location: [facility];  Service: Orthopedics;  Laterality: Left;  I&D Right Long and Index Fingers with Tendon Repair as Needed   KNEE ARTHROSCOPY WITH MEDIAL MENISECTOMY Left 05/17/2021   Procedure: KNEE ARTHROSCOPY WITH MICROFRACTURE;  Surgeon: Margrette Taft BRAVO, MD;  Location: [facility];  Service: Orthopedics;  Laterality: Left;   MENISECTOMY Left 05/17/2021   Procedure: PARTIAL MEDIAL MENISECTOMY;  Surgeon: Margrette Taft BRAVO, MD;  Location: [facility];  Service: Orthopedics;  Laterality: Left;    Current Outpatient Medications on File Prior to Visit  Medication Sig Dispense Refill  Accu-Chek Softclix Lancets lancets Use as instructed 100 each 12   allopurinol  (ZYLOPRIM ) 300 MG tablet Take 1 tablet (300 mg total) by mouth daily. 90 tablet 3   blood glucose meter kit and supplies Dispense based on patient and insurance preference. Use daily to check blood sugar.. (FOR ICD-10 E10.9, E11.9). 1 each 0   Blood Glucose Monitoring Suppl DEVI 1 each by Does not apply route 3 (three) times daily. May substitute to any manufacturer covered by patient's insurance. 1 each 0   celecoxib  (CELEBREX ) 100 MG capsule Take 1 capsule (100 mg total) by mouth 2 (two) times daily as needed for mild pain (pain score 1-3) or moderate pain (pain score 4-6). 60 capsule 1    chlorthalidone  (HYGROTON ) 25 MG tablet TAKE 1 TABLET(25 MG) BY MOUTH DAILY 90 tablet 1   cyclobenzaprine  (FLEXERIL ) 5 MG tablet Take 1 tablet (5 mg total) by mouth 2 (two) times daily as needed for muscle spasms. 30 tablet 1   enalapril  (VASOTEC ) 20 MG tablet TAKE 1 TABLET(20 MG) BY MOUTH DAILY 90 tablet 3   Glucose Blood (BLOOD GLUCOSE TEST STRIPS) STRP 1 each by In Vitro route in the morning, at noon, and at bedtime. May substitute to any manufacturer covered by patient's insurance. 100 strip 5   glucose blood (ONETOUCH VERIO) test strip USE TO CHECK BLOOD SUGAR DAILY 100 strip 1   Lancet Device MISC Three times daily DX e11.65 May substitute to any manufacturer covered by patient's insurance. 1 each 0   loperamide  (IMODIUM ) 2 MG capsule Take 1 capsule (2 mg total) by mouth 4 (four) times daily as needed for diarrhea or loose stools. 12 capsule 0   metoprolol  tartrate (LOPRESSOR ) 25 MG tablet Take 0.5 tablets (12.5 mg total) by mouth 2 (two) times daily. 90 tablet 3   Misc. Devices KIT CPAP nose piece and heated tubing. 1 kit 0   omeprazole  (PRILOSEC) 20 MG capsule Take 1 capsule (20 mg total) by mouth daily. 30 capsule 5   ondansetron  (ZOFRAN -ODT) 8 MG disintegrating tablet 8mg  ODT q4 hours prn nausea 10 tablet 0   Respiratory Therapy Supplies (NEBULIZER MASK ADULT/TUBING) MISC 1 each by Does not apply route at bedtime. 1 each 11   rosuvastatin  (CRESTOR ) 10 MG tablet Take 1 tablet (10 mg total) by mouth daily. 90 tablet 3   tirzepatide  (MOUNJARO ) 10 MG/0.5ML Pen ADMINISTER 10 MG UNDER THE SKIN 1 TIME A WEEK 2 mL 5   Current Facility-Administered Medications on File Prior to Visit  Medication Dose Route Frequency Provider Last Rate Last Admin   Sodium Hyaluronate (Viscosup) SOSY 2.5 mL  2.5 mL Intra-articular Once Margrette Taft BRAVO, MD       Sodium Hyaluronate (Viscosup) SOSY   Intra-articular Once Margrette Taft BRAVO, MD        Social History   Socioeconomic History   Marital status:  Married    Spouse name: Not on file   Number of children: 3   Years of education: Not on file   Highest education level: Not on file  Occupational History   Occupation: [employer]    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 1 beer or a glass of wine a couple times a week.    Drug use: No   Sexual activity: Never  Other Topics Concern   Not on file  Social History Narrative   Not on file   Social Drivers of Health   Financial Resource Strain: Not on file  Food Insecurity: Not on file  Transportation Needs: Not on file  Physical Activity: Not on file  Stress: Not on file  Social Connections: Unknown (04/11/2022)   [facility]   Social Network    Social Network: Not on file  Intimate Partner Violence: Unknown (03/03/2022)   Received from [facility]   HITS    Physically Hurt: Not on file    Insult or Talk Down To: Not on file    Threaten Physical Harm: Not on file    Scream or Curse: Not on file    Family History  Problem Relation Age of Onset   Diabetes Father    Diabetes Paternal Aunt    Colon cancer Neg Hx     BP 115/71   Pulse 87   Ht 6' 5 (1.956 m)   Wt (!) 358 lb (162.4 kg)   BMI 42.45 kg/m   Body mass index is 42.45 kg/m.      Objective:   Physical Exam Vitals and nursing note reviewed. Exam conducted with a chaperone present.  Constitutional:      Appearance: He is well-developed.  HENT:     Head: Normocephalic and atraumatic.  Eyes:     Conjunctiva/sclera: Conjunctivae normal.     Pupils: Pupils are equal, round, and reactive to light.  Cardiovascular:     Rate and Rhythm: Normal rate and regular rhythm.  Pulmonary:     Effort: Pulmonary effort is normal.  Abdominal:     Palpations: Abdomen is soft.  Musculoskeletal:       Arms:     Cervical back: Normal  range of motion and neck supple.  Skin:    General: Skin is warm and dry.  Neurological:     Mental Status: He is alert and oriented to person, place, and time.     Cranial Nerves: No cranial nerve deficit.     Motor: No abnormal muscle tone.     Coordination: Coordination normal.     Deep Tendon Reflexes: Reflexes are normal and symmetric. Reflexes normal.  Psychiatric:        Behavior: Behavior normal.        Thought Content: Thought content normal.        Judgment: Judgment normal.           Assessment & Plan:   Encounter Diagnosis  Name Primary?   Lumbar pain with radiation down right leg Yes   I would like to get MRI as he has not improved after a month and is probably worse.  I am concerned about a HNP.  He will continue on his present medicine.  I will get OPEN MRI.  Return in two weeks.  Stay out of work.  Call if any problem.  Precautions discussed.  Electronically Signed Lemond Stable, MD 8/7/20253:21 PM

## 2024-07-03 NOTE — Patient Instructions (Signed)
 MRI has been ordered and will be sent to [facility] for the OPEN unit.   Follow up in weeks with Dr. Brenna.  I will call your insurance to get the mri approved may need Physical therapy first.   Needs work note until next appointment.

## 2024-07-10 ENCOUNTER — Other Ambulatory Visit: Payer: Self-pay | Admitting: Internal Medicine

## 2024-07-10 DIAGNOSIS — R112 Nausea with vomiting, unspecified: Secondary | ICD-10-CM

## 2024-07-10 DIAGNOSIS — M51362 Other intervertebral disc degeneration, lumbar region with discogenic back pain and lower extremity pain: Secondary | ICD-10-CM

## 2024-07-10 MED ORDER — LUMBAR BACK BRACE/SUPPORT PAD MISC
0 refills | Status: AC
Start: 2024-07-10 — End: ?

## 2024-07-10 MED ORDER — ONDANSETRON HCL 4 MG PO TABS: 4.0000 mg | ORAL_TABLET | Freq: Three times a day (TID) | ORAL | 0 refills | Status: AC | PRN

## 2024-07-10 NOTE — Progress Notes (Signed)
 Received MyChart message from his wife [name] stating that [name] has been having spells of nausea and vomiting since last night.  Sent Zofran  as needed for nausea and vomiting.  Advised to go to ER if he has persistent vomiting spells and has dizziness and inability to tolerate p.o. intake.  Tried to call [name] about this, was unable to reach. Responded to [name] with MyChart message.

## 2024-07-10 NOTE — Addendum Note (Signed)
 Addended byBETHA TOBIE DOWNS on: 07/10/2024 02:17 PM   Modules accepted: Orders

## 2024-07-16 ENCOUNTER — Ambulatory Visit (INDEPENDENT_AMBULATORY_CARE_PROVIDER_SITE_OTHER): Admitting: Orthopaedic Surgery

## 2024-07-16 ENCOUNTER — Encounter: Payer: Self-pay | Admitting: Orthopaedic Surgery

## 2024-07-16 VITALS — BP 122/78 | HR 88 | Ht 77.0 in | Wt 361.0 lb

## 2024-07-16 DIAGNOSIS — M545 Low back pain, unspecified: Secondary | ICD-10-CM | POA: Diagnosis not present

## 2024-07-16 DIAGNOSIS — M79604 Pain in right leg: Secondary | ICD-10-CM

## 2024-07-16 LAB — HM DIABETES EYE EXAM

## 2024-07-16 MED ORDER — HYDROCODONE-ACETAMINOPHEN 7.5-325 MG PO TABS: 1.0000 | ORAL_TABLET | ORAL | 0 refills | Status: AC | PRN

## 2024-07-16 NOTE — Progress Notes (Signed)
 My back is worse.  He had the MRI of the lumbar spine showing: IMPRESSION:  1. Mild bilateral neuroforaminal stenosis L2-3.  2. Moderate bilateral neuroforaminal stenosis L3-4 and L4-5.  3. Mild bilateral lateral recess stenosis and moderate bilateral neuroforaminal stenosis L5-S1.  4. Incompletely evaluated intradural, extramedullary mass lesion of the cauda equina the level of L5 as above. Differential considerations include nerve sheath tumor, meningioma as well as others. Contrast enhanced imaging is recommended for further, more complete evaluation.   I have explained the findings to him.  I will have another MRI with contrast scheduled.  I answered his questions.  I will have him see a neurosurgeon after getting this new MRI.  Lower back is painful, he has pain down the right leg, ROM is good, NV intact, muscle tone and strength normal.  Encounter Diagnosis  Name Primary?   Lumbar pain with radiation down right leg Yes   Get the new MRI.  I have reviewed the [place]  Controlled Substance Reporting System web site prior to prescribing narcotic medicine for this patient.  I have informed the patient I will be retiring from medical practice and from this office on August 28, 2024.  The patient has been offered continuing care with Dr. Margrette or Dr. Onesimo of this office.  The patient may choose another provider and the records will be forwarded after proper signature and notification.  Patient understands and agrees.  Return in two weeks or earlier if gets MRI earlier.  Call if any problem.  Precautions discussed.  Electronically Signed Lemond Stable, MD 8/20/20253:41 PM

## 2024-07-16 NOTE — Patient Instructions (Signed)
 Note to be out of work for Two weeks  MRI lumbar spine with contrast has been ordered as STAT. [facility] will call to schedule appt

## 2024-07-17 ENCOUNTER — Encounter: Payer: Self-pay | Admitting: Internal Medicine

## 2024-07-18 ENCOUNTER — Encounter: Payer: Self-pay | Admitting: Radiology

## 2024-07-22 ENCOUNTER — Telehealth: Payer: Self-pay | Admitting: *Deleted

## 2024-07-22 NOTE — Telephone Encounter (Signed)
 Pt came in to inform you that he got his MRI done today 07/22/2024

## 2024-07-25 ENCOUNTER — Other Ambulatory Visit: Payer: Self-pay | Admitting: Internal Medicine

## 2024-07-25 DIAGNOSIS — E782 Mixed hyperlipidemia: Secondary | ICD-10-CM

## 2024-07-30 ENCOUNTER — Ambulatory Visit: Admitting: Orthopaedic Surgery

## 2024-08-11 ENCOUNTER — Other Ambulatory Visit: Payer: Self-pay | Admitting: Internal Medicine

## 2024-08-11 DIAGNOSIS — E1169 Type 2 diabetes mellitus with other specified complication: Secondary | ICD-10-CM

## 2024-08-15 NOTE — Progress Notes (Signed)
 CARDIOLOGY CONSULT NOTE       Patient ID: James Rivas MRN: [id number] DOB/AGE: [birth year] 53 y.o.  Admit date: (Not on file) Referring Physician: Waddell Primary Physician: Tobie Suzzane POUR, MD Primary Cardiologist: Delford Reason for Consultation: PVC abnormal ECG    HPI:  53 y.o. previously seen by Dr POUR for palpitations. Monitor with PVCls 4 beat NSVT Rx with beta blocker with improvement Normal echo September 2019 reviewed. History of HTN Rx with beta blocker , ACE And diuretic. Morbid obesity Most recent ECG 11/25/20 with SR poor R wave progression nonspecific inferior ST changes. 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 [employer] Very busy He likes to root for [employer] hours at work last 29 years are 3pm to 3 am which is tough  Activity limited by weight and gout in left knee Has had scope June 2022 with meniscus repair  On allopurinol  Sees Dr Margrette Has had 3 injections of Supartz Also lower back pain with some foraminal narrowing on MRI Uses Celebrx and Flexeril  for pain   No chest pain Some dependant edema When I first saw May 2022 recommended calcium  score to risk stratify for CAD but he deferred   Has chronic LE venous varicosities seen by [facility] Dr Melvenia recommend compression stockings Did mention laser ablation left greater saphenous vein with stab phlebectomies   Calcium  score August 2023 was 0   No cardiac complaints  Discussed lotion on dry legs to prevent ulcers with venous stasis. Working at [employer]. Married with 3 kids and grand kids  ROS All other systems reviewed and negative except as noted above  Past Medical History:  Diagnosis Date   Allergy    Arthritis    left knee   Chest pain    Chronic chest pain 06/22/2013   Diabetes  mellitus without complication (HCC)    Gout    Gout    History of COVID-19 12/20/2020   HTN (hypertension)    Impaired fasting glucose    Morbid obesity (HCC)    Sleep apnea    uses a CPAP   Venous stasis     Family History  Problem Relation Age of Onset   Diabetes Father    Diabetes Paternal Aunt    Colon cancer Neg Hx     Social History   Socioeconomic History   Marital status: Married    Spouse name: Not on file   Number of children: 3   Years of education: Not on file   Highest education level: Not on file  Occupational History   Occupation: [employer]    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 1 beer or a glass of wine a couple times a week.    Drug use: No   Sexual activity: Never  Other Topics Concern   Not on file  Social History Narrative   Not on file   Social Drivers of Health   Financial Resource Strain: Not on file  Food Insecurity: Not on file  Transportation Needs: Not on file  Physical  Activity: Not on file  Stress: Not on file  Social Connections: Unknown (04/11/2022)   Received from [facility]   Social Network    Social Network: Not on file  Intimate Partner Violence: Unknown (03/03/2022)   Received from [facility]   HITS    Physically Hurt: Not on file    Insult or Talk Down To: Not on file    Threaten Physical Harm: Not on file    Scream or Curse: Not on file    Past Surgical History:  Procedure Laterality Date   COLONOSCOPY WITH PROPOFOL  N/A 05/11/2020   Procedure: COLONOSCOPY WITH PROPOFOL ;  Surgeon: Shaaron Lamar HERO, MD;  [facility];  Service: Endoscopy;  Laterality: N/A;  2:15pm   FINGER SURGERY Left    left ring finger 2 surgeries   I & D EXTREMITY  11/29/2011   Procedure: IRRIGATION AND DEBRIDEMENT EXTREMITY;  Surgeon: Prentice LELON Pagan;   Location: [facility];  Service: Orthopedics;  Laterality: Left;  I&D Right Long and Index Fingers with Tendon Repair as Needed   KNEE ARTHROSCOPY WITH MEDIAL MENISECTOMY Left 05/17/2021   Procedure: KNEE ARTHROSCOPY WITH MICROFRACTURE;  Surgeon: Margrette Taft BRAVO, MD;  Location: [facility];  Service: Orthopedics;  Laterality: Left;   MENISECTOMY Left 05/17/2021   Procedure: PARTIAL MEDIAL MENISECTOMY;  Surgeon: Margrette Taft BRAVO, MD;  Location: [facility];  Service: Orthopedics;  Laterality: Left;      Current Outpatient Medications:    Accu-Chek Softclix Lancets lancets, Use as instructed, Disp: 100 each, Rfl: 12   allopurinol  (ZYLOPRIM ) 300 MG tablet, Take 1 tablet (300 mg total) by mouth daily., Disp: 90 tablet, Rfl: 3   blood glucose meter kit and supplies, Dispense based on patient and insurance preference. Use daily to check blood sugar.. (FOR ICD-10 E10.9, E11.9)., Disp: 1 each, Rfl: 0   Blood Glucose Monitoring Suppl DEVI, 1 each by Does not apply route 3 (three) times daily. May substitute to any manufacturer covered by patient's insurance., Disp: 1 each, Rfl: 0   celecoxib  (CELEBREX ) 100 MG capsule, Take 1 capsule (100 mg total) by mouth 2 (two) times daily as needed for mild pain (pain score 1-3) or moderate pain (pain score 4-6)., Disp: 60 capsule, Rfl: 1   chlorthalidone  (HYGROTON ) 25 MG tablet, TAKE 1 TABLET(25 MG) BY MOUTH DAILY, Disp: 90 tablet, Rfl: 1   cyclobenzaprine  (FLEXERIL ) 5 MG tablet, Take 1 tablet (5 mg total) by mouth 2 (two) times daily as needed for muscle spasms., Disp: 30 tablet, Rfl: 1   Elastic Bandages & Supports (LUMBAR BACK BRACE/SUPPORT PAD) MISC, Use it as directed for back pain. ICD10: M51.369, Disp: 2 each, Rfl: 0   enalapril  (VASOTEC ) 20 MG tablet, TAKE 1 TABLET(20 MG) BY MOUTH DAILY, Disp: 90 tablet, Rfl: 3   Glucose Blood (BLOOD GLUCOSE TEST STRIPS) STRP, 1 each by In Vitro route in the morning, at noon, and at bedtime. May substitute to any manufacturer covered by  patient's insurance., Disp: 100 strip, Rfl: 5   glucose blood (ONETOUCH VERIO) test strip, USE TO CHECK BLOOD SUGAR DAILY, Disp: 100 strip, Rfl: 1   Lancet Device MISC, Three times daily DX e11.65 May substitute to any manufacturer covered by patient's insurance., Disp: 1 each, Rfl: 0   loperamide  (IMODIUM ) 2 MG capsule, Take 1 capsule (2 mg total) by mouth 4 (four) times daily as needed for diarrhea or loose stools., Disp: 12 capsule, Rfl: 0   metoprolol  tartrate (LOPRESSOR ) 25 MG tablet, Take 0.5 tablets (  12.5 mg total) by mouth 2 (two) times daily., Disp: 90 tablet, Rfl: 3   Misc. Devices KIT, CPAP nose piece and heated tubing., Disp: 1 kit, Rfl: 0   MOUNJARO  10 MG/0.5ML Pen, ADMINISTER 10 MG UNDER THE SKIN 1 TIME A WEEK, Disp: 2 mL, Rfl: 5   omeprazole  (PRILOSEC) 20 MG capsule, Take 1 capsule (20 mg total) by mouth daily., Disp: 30 capsule, Rfl: 5   ondansetron  (ZOFRAN ) 4 MG tablet, Take 1 tablet (4 mg total) by mouth every 8 (eight) hours as needed for nausea or vomiting., Disp: 20 tablet, Rfl: 0   Respiratory Therapy Supplies (NEBULIZER MASK ADULT/TUBING) MISC, 1 each by Does not apply route at bedtime., Disp: 1 each, Rfl: 11   rosuvastatin  (CRESTOR ) 10 MG tablet, TAKE 1 TABLET(10 MG) BY MOUTH DAILY, Disp: 90 tablet, Rfl: 3  Current Facility-Administered Medications:    Sodium Hyaluronate (Viscosup) SOSY 2.5 mL, 2.5 mL, Intra-articular, Once, Margrette Taft BRAVO, MD   Sodium Hyaluronate (Viscosup) SOSY, , Intra-articular, Once, Margrette Taft BRAVO, MD    Physical Exam: Blood pressure 118/74, pulse 72, height 6' 5 (1.956 m), weight (!) 365 lb 3.2 oz (165.7 kg), SpO2 96%.    Affect appropriate Overweight black male  HEENT: normal Neck supple with no adenopathy JVP normal left bruits no thyromegaly Lungs clear with no wheezing and good diaphragmatic motion Heart:  S1/S2 no murmur, no rub, gallop or click PMI normal Abdomen: benighn, BS positve, no tenderness, no AAA no bruit.  No  HSM or HJR Distal pulses intact with no bruits Bilateral varicosities with chronic stasis (hyperpigmentation and lipo dermatosclerosis )  Neuro non-focal Skin warm and dry No muscular weakness   Labs:   Lab Results  Component Value Date   WBC 6.1 02/16/2023   HGB 15.2 02/16/2023   HCT 45.7 02/16/2023   MCV 80 02/16/2023   PLT 206 02/16/2023   No results for input(s): NA, K, CL, CO2, BUN, CREATININE, CALCIUM , PROT, BILITOT, ALKPHOS, ALT, AST, GLUCOSE in the last 168 hours.  Invalid input(s): LABALBU Lab Results  Component Value Date   TROPONINI <0.03 06/14/2018    Lab Results  Component Value Date   CHOL 116 10/24/2023   CHOL 115 02/16/2023   CHOL 119 02/13/2022   Lab Results  Component Value Date   HDL 43 10/24/2023   HDL 43 02/16/2023   HDL 44 02/13/2022   Lab Results  Component Value Date   LDLCALC 58 10/24/2023   LDLCALC 58 02/16/2023   LDLCALC 59 02/13/2022   Lab Results  Component Value Date   TRIG 75 10/24/2023   TRIG 62 02/16/2023   TRIG 80 02/13/2022   Lab Results  Component Value Date   CHOLHDL 2.7 10/24/2023   CHOLHDL 2.7 02/16/2023   CHOLHDL 2.7 02/13/2022   No results found for: LDLDIRECT    Radiology: No results found.  EKG: see HPI  08/29/2024 SR rate 98 PAC low voltage    ASSESSMENT AND PLAN:   1. PVC:  Benign appearing Rx with beta blocker previous echo with no structural heart disease. Coronary calcium  score 0 07/07/22  2. HTN:  Well controlled.  Continue current medications and low sodium Dash type diet.   3. Obesity: discussed diet, exercise possible bariatric surgery   4. Gout :  Continue allopurinol  and PRN colchicine  for flare  5. Venous Varicosities:  F/U Dixon [facility] compression stockings consider laser ablation of left > saphenous vein if worsens  6. DM:  Discussed low carb  diet.  Target hemoglobin A1c is 6.5 or less.  Continue current medications. A1c 6.1  7:  OSA:  CPAP and weight loss  8. HLD:   on crestor  10 mg LDL 58 9. Bruit:  left needs carotid duplex   Carotid Duplex fr left bruit  F/U in a year   Signed: Maude Emmer 08/29/2024, 9:16 AM

## 2024-08-20 ENCOUNTER — Other Ambulatory Visit: Payer: Self-pay | Admitting: *Deleted

## 2024-08-20 ENCOUNTER — Encounter: Payer: Self-pay | Admitting: Orthopaedic Surgery

## 2024-08-20 DIAGNOSIS — M79604 Pain in right leg: Secondary | ICD-10-CM

## 2024-08-29 ENCOUNTER — Ambulatory Visit: Attending: Cardiovascular Disease | Admitting: Cardiovascular Disease

## 2024-08-29 ENCOUNTER — Encounter: Payer: Self-pay | Admitting: Cardiovascular Disease

## 2024-08-29 VITALS — BP 118/74 | HR 72 | Ht 77.0 in | Wt 365.2 lb

## 2024-08-29 DIAGNOSIS — I493 Ventricular premature depolarization: Secondary | ICD-10-CM | POA: Diagnosis not present

## 2024-08-29 DIAGNOSIS — I10 Essential (primary) hypertension: Secondary | ICD-10-CM

## 2024-08-29 DIAGNOSIS — R0989 Other specified symptoms and signs involving the circulatory and respiratory systems: Secondary | ICD-10-CM | POA: Diagnosis not present

## 2024-08-29 DIAGNOSIS — I8393 Asymptomatic varicose veins of bilateral lower extremities: Secondary | ICD-10-CM

## 2024-08-29 NOTE — Patient Instructions (Signed)
Medication Instructions:  Your physician recommends that you continue on your current medications as directed. Please refer to the Current Medication list given to you today.   Labwork: None today  Testing/Procedures: Your physician has requested that you have a carotid duplex. This test is an ultrasound of the carotid arteries in your neck. It looks at blood flow through these arteries that supply the brain with blood. Allow one hour for this exam. There are no restrictions or special instructions.   Follow-Up: 1 year  Any Other Special Instructions Will Be Listed Below (If Applicable).  If you need a refill on your cardiac medications before your next appointment, please call your pharmacy.

## 2024-09-02 ENCOUNTER — Ambulatory Visit (HOSPITAL_COMMUNITY): Admission: RE | Admit: 2024-09-02 | Source: Ambulatory Visit

## 2024-09-05 ENCOUNTER — Ambulatory Visit (HOSPITAL_COMMUNITY)
Admission: RE | Admit: 2024-09-05 | Discharge: 2024-09-05 | Disposition: A | Discharge status: Home / Self Care | Payer: Self-pay | Source: Ambulatory Visit | Attending: Cardiovascular Disease | Admitting: Cardiovascular Disease

## 2024-09-05 DIAGNOSIS — R0989 Other specified symptoms and signs involving the circulatory and respiratory systems: Secondary | ICD-10-CM | POA: Insufficient documentation

## 2024-09-08 ENCOUNTER — Ambulatory Visit: Payer: Self-pay | Admitting: Cardiovascular Disease

## 2024-09-15 ENCOUNTER — Other Ambulatory Visit: Payer: Self-pay | Admitting: Family Medicine

## 2024-09-15 ENCOUNTER — Other Ambulatory Visit: Payer: Self-pay | Admitting: Internal Medicine

## 2024-09-15 ENCOUNTER — Inpatient Hospital Stay
Admission: RE | Admit: 2024-09-15 | Discharge: 2024-09-15 | Disposition: A | Discharge status: Home / Self Care | Payer: Self-pay | Source: Ambulatory Visit | Attending: Physician Assistant | Admitting: Physician Assistant

## 2024-09-15 DIAGNOSIS — Z049 Encounter for examination and observation for unspecified reason: Secondary | ICD-10-CM

## 2024-09-15 DIAGNOSIS — I10 Essential (primary) hypertension: Secondary | ICD-10-CM

## 2024-09-16 NOTE — Progress Notes (Unsigned)
 Referring Physician:  Brenna Lin, MD No address on file  Primary Physician:  Tobie Suzzane POUR, MD  History of Present Illness: 09/24/2024 James Rivas is here today with a chief complaint of chronic low back pain and numbness and tingling to his anterior lateral right thigh since July.  The numbness, tingling, and burning pain in his right thigh keeps him up at night and causes him significant discomfort.  Denies any weakness, saddle anesthesia, incontinence of bowel or bladder.   Weakness: none  Bowel/Bladder Dysfunction: none  Conservative measures:  Physical therapy:  has not participated in PT Multimodal medical therapy including regular antiinflammatories:  Celebrex , Flexeril  Injections: no epidural steroid injections  Past Surgery: none  James Rivas has no symptoms of cervical myelopathy.  The symptoms are causing a significant impact on the patient's life.   Review of Systems:  A 10 point review of systems is negative, except for the pertinent positives and negatives detailed in the HPI.  Past Medical History: Past Medical History:  Diagnosis Date   Allergy    Arthritis    left knee   Chest pain    Chronic chest pain 06/22/2013   Diabetes mellitus without complication (HCC)    Gout    Gout    History of COVID-19 12/20/2020   HTN (hypertension)    Impaired fasting glucose    Morbid obesity (HCC)    Sleep apnea    uses a CPAP   Venous stasis     Past Surgical History: Past Surgical History:  Procedure Laterality Date   COLONOSCOPY WITH PROPOFOL  N/A 05/11/2020   Procedure: COLONOSCOPY WITH PROPOFOL ;  Surgeon: Shaaron Lamar HERO, MD;  Location: [facility];  Service: Endoscopy;  Laterality: N/A;  2:15pm   FINGER SURGERY Left    left ring finger 2 surgeries   I & D EXTREMITY  11/29/2011   Procedure: IRRIGATION AND DEBRIDEMENT EXTREMITY;  Surgeon: Prentice LELON Pagan;  Location: [facility];  Service: Orthopedics;  Laterality: Left;  I&D Right Long and Index  Fingers with Tendon Repair as Needed   KNEE ARTHROSCOPY WITH MEDIAL MENISECTOMY Left 05/17/2021   Procedure: KNEE ARTHROSCOPY WITH MICROFRACTURE;  Surgeon: Margrette Taft BRAVO, MD;  Location: [facility];  Service: Orthopedics;  Laterality: Left;   MENISECTOMY Left 05/17/2021   Procedure: PARTIAL MEDIAL MENISECTOMY;  Surgeon: Margrette Taft BRAVO, MD;  Location: [facility];  Service: Orthopedics;  Laterality: Left;    Allergies: Allergies as of 09/24/2024   (No Known Allergies)    Medications: Outpatient Encounter Medications as of 09/24/2024  Medication Sig   Accu-Chek Softclix Lancets lancets Use as instructed   allopurinol  (ZYLOPRIM ) 300 MG tablet Take 1 tablet (300 mg total) by mouth daily.   blood glucose meter kit and supplies Dispense based on patient and insurance preference. Use daily to check blood sugar.. (FOR ICD-10 E10.9, E11.9).   Blood Glucose Monitoring Suppl DEVI 1 each by Does not apply route 3 (three) times daily. May substitute to any manufacturer covered by patient's insurance.   celecoxib  (CELEBREX ) 100 MG capsule Take 1 capsule (100 mg total) by mouth 2 (two) times daily as needed for mild pain (pain score 1-3) or moderate pain (pain score 4-6).   chlorthalidone  (HYGROTON ) 25 MG tablet TAKE 1 TABLET(25 MG) BY MOUTH DAILY   cyclobenzaprine  (FLEXERIL ) 5 MG tablet Take 1 tablet (5 mg total) by mouth 2 (two) times daily as needed for muscle spasms.   Elastic Bandages & Supports (LUMBAR BACK BRACE/SUPPORT PAD)  MISC Use it as directed for back pain. ICD10: M51.369   enalapril  (VASOTEC ) 20 MG tablet TAKE 1 TABLET(20 MG) BY MOUTH DAILY   Glucose Blood (BLOOD GLUCOSE TEST STRIPS) STRP 1 each by In Vitro route in the morning, at noon, and at bedtime. May substitute to any manufacturer covered by patient's insurance.   glucose blood (ONETOUCH VERIO) test strip USE TO CHECK BLOOD SUGAR DAILY   Lancet Device MISC Three times daily DX e11.65 May substitute to any manufacturer covered by  patient's insurance.   metoprolol  tartrate (LOPRESSOR ) 25 MG tablet Take 0.5 tablets (12.5 mg total) by mouth 2 (two) times daily.   Misc. Devices KIT CPAP nose piece and heated tubing.   MOUNJARO  10 MG/0.5ML Pen ADMINISTER 10 MG UNDER THE SKIN 1 TIME A WEEK   omeprazole  (PRILOSEC) 20 MG capsule Take 1 capsule (20 mg total) by mouth daily.   ondansetron  (ZOFRAN ) 4 MG tablet Take 1 tablet (4 mg total) by mouth every 8 (eight) hours as needed for nausea or vomiting.   Respiratory Therapy Supplies (NEBULIZER MASK ADULT/TUBING) MISC 1 each by Does not apply route at bedtime.   rosuvastatin  (CRESTOR ) 10 MG tablet TAKE 1 TABLET(10 MG) BY MOUTH DAILY   loperamide  (IMODIUM ) 2 MG capsule Take 1 capsule (2 mg total) by mouth 4 (four) times daily as needed for diarrhea or loose stools.   Facility-Administered Encounter Medications as of 09/24/2024  Medication   Sodium Hyaluronate (Viscosup) SOSY 2.5 mL   Sodium Hyaluronate (Viscosup) SOSY    Social History: Social History   Tobacco Use   Smoking status: Never   Smokeless tobacco: Never  Vaping Use   Vaping status: Never Used  Substance Use Topics   Alcohol use: Not Currently    Comment: occ. 1 beer or a glass of wine a couple times a week.    Drug use: No    Family Medical History: Family History  Problem Relation Age of Onset   Diabetes Father    Diabetes Paternal Aunt    Colon cancer Neg Hx     Physical Examination: @VITALWITHPAIN @  General: Patient is well developed, well nourished, calm, collected, and in no apparent distress. Attention to examination is appropriate.  Psychiatric: Patient is non-anxious.  Head:  Pupils equal, round, and reactive to light.  ENT:  Oral mucosa appears well hydrated.  Neck:   Supple.  Full range of motion.  Respiratory: Patient is breathing without any difficulty.  Extremities: No edema.  Vascular: Palpable dorsal pedal pulses.  Skin:   On exposed skin, there are no abnormal skin  lesions.  NEUROLOGICAL:     Awake, alert, oriented to person, place, and time.  Speech is clear and fluent. Fund of knowledge is appropriate.   Cranial Nerves: Pupils equal round and reactive to light.  Facial tone is symmetric.   ROM of spine: Minimal to no tenderness to palpation of lumbar paraspinals.  Strength:  Side Iliopsoas Quads Hamstring PF DF EHL  R 5 5 5 5 5 5   L 5 5 5 5 5 5     Difficult to obtain Tinel of right LF CN secondary to body habitus  Bilateral upper and lower extremity sensation is intact to light touch, with the exception of decree sensation in right LF CN distribution. Gait is normal.   No difficulty with tandem gait.   No evidence of dysmetria noted.  Medical Decision Making  Imaging: MRI lumbar with contrast:   TECHNIQUE: Sagittal and axial T1-weighted sequences  were performed after intravenous injection of 15 mL Gadavist.   INDICATION: Right-sided radiculopathy   COMPARISON: MRI July 07, 2024   FINDINGS:    There is an intradural extra medullary enhancing nodule seen adjacent to the nerve roots at the level of L5-S1. Disc nodule is well-defined measuring 10 x 7 x 11 mm in size. No other enhancing lesion. This finding may reflect nerve sheath tumor.   I have personally reviewed the images and agree with the above interpretation.  Assessment and Plan: James Rivas is a pleasant 53 y.o. 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  Patient's biggest complaint at this time is neuropathic pain in his right thigh.  I do think this is likely due to a right lateral femoral cutaneous neuropathy/meralgia paresthetica.  Plan includes the following moving forward:  -Repeat MRI 6 months from last date to track nerve sheath tumor size - Plan for gabapentin to help with patient's nerve pain especially since its exacerbated at night.  The risks and  benefits of this medication were discussed at length. - Plan for injection for right LF CN.  Will send note to pain clinic to try to get this facilitated soon as possible. - Will check in following injection.  Thank you for involving me in the care of this patient.     Lyle Decamp, PA-C Dept. of Neurosurgery

## 2024-09-24 ENCOUNTER — Ambulatory Visit: Payer: Self-pay | Admitting: Physician Assistant

## 2024-09-24 ENCOUNTER — Encounter: Payer: Self-pay | Admitting: Physician Assistant

## 2024-09-24 VITALS — BP 134/78 | Ht 75.5 in | Wt 357.0 lb

## 2024-09-24 DIAGNOSIS — G5711 Meralgia paresthetica, right lower limb: Secondary | ICD-10-CM

## 2024-09-24 DIAGNOSIS — G8929 Other chronic pain: Secondary | ICD-10-CM | POA: Diagnosis not present

## 2024-09-24 DIAGNOSIS — M549 Dorsalgia, unspecified: Secondary | ICD-10-CM

## 2024-09-24 DIAGNOSIS — D492 Neoplasm of unspecified behavior of bone, soft tissue, and skin: Secondary | ICD-10-CM

## 2024-09-24 MED ORDER — GABAPENTIN 100 MG PO CAPS: 100.0000 mg | ORAL_CAPSULE | Freq: Every day | ORAL | 1 refills | Status: AC

## 2024-09-26 ENCOUNTER — Other Ambulatory Visit (HOSPITAL_COMMUNITY): Payer: Self-pay

## 2024-09-26 ENCOUNTER — Telehealth: Payer: Self-pay | Admitting: Pharmacy Technician

## 2024-09-26 NOTE — Telephone Encounter (Signed)
 Pharmacy Patient Advocate Encounter   Received notification from CoverMyMeds that prior authorization for Mounjaro  10MG /0.5ML auto-injectors is required/requested.   Insurance verification completed.   The patient is insured through [facility].   Per test claim: PA required; PA submitted to above mentioned insurance via Latent Key/confirmation #/EOC [id number] Status is pending

## 2024-09-29 ENCOUNTER — Other Ambulatory Visit (HOSPITAL_COMMUNITY): Payer: Self-pay

## 2024-09-29 ENCOUNTER — Encounter: Payer: Self-pay | Admitting: Radiology

## 2024-09-29 NOTE — Telephone Encounter (Signed)
 Pharmacy Patient Advocate Encounter  Received notification from [facility] that Prior Authorization for Mounjaro  10MG /0.5ML auto-injectors has been APPROVED from 09/26/2024 to 09/26/2025. Ran test claim, Copay is $50.00. This test claim was processed through [facility]- copay amounts may vary at other pharmacies due to pharmacy/plan contracts, or as the patient moves through the different stages of their insurance plan.   PA #/Case ID/Reference #: [id number]

## 2024-10-07 ENCOUNTER — Encounter: Payer: Self-pay | Admitting: Student in an Organized Health Care Education/Training Program

## 2024-10-07 ENCOUNTER — Ambulatory Visit
Attending: Student in an Organized Health Care Education/Training Program | Admitting: Student in an Organized Health Care Education/Training Program

## 2024-10-07 VITALS — BP 121/73 | HR 81 | Temp 97.9°F | Resp 16 | Ht 77.0 in | Wt 335.0 lb

## 2024-10-07 DIAGNOSIS — D497 Neoplasm of unspecified behavior of endocrine glands and other parts of nervous system: Secondary | ICD-10-CM | POA: Insufficient documentation

## 2024-10-07 DIAGNOSIS — M79604 Pain in right leg: Secondary | ICD-10-CM | POA: Diagnosis not present

## 2024-10-07 DIAGNOSIS — G5711 Meralgia paresthetica, right lower limb: Secondary | ICD-10-CM | POA: Diagnosis not present

## 2024-10-07 NOTE — Progress Notes (Signed)
 Safety precautions to be maintained throughout the outpatient stay will include: orient to surroundings, keep bed in low position, maintain call bell within reach at all times, provide assistance with transfer out of bed and ambulation.

## 2024-10-07 NOTE — Patient Instructions (Signed)
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
## 2024-10-08 ENCOUNTER — Ambulatory Visit (INDEPENDENT_AMBULATORY_CARE_PROVIDER_SITE_OTHER): Admitting: Physician Assistant

## 2024-10-08 DIAGNOSIS — G5711 Meralgia paresthetica, right lower limb: Secondary | ICD-10-CM | POA: Diagnosis not present

## 2024-10-08 NOTE — Progress Notes (Addendum)
 Spoke with patient over the phone regarding his gabapentin that was prescribed to him for his nerve pain that is thought to be from myalgia paresthetica.  He is only been using 100 mg of gabapentin at night and thus far he has not been able to tell much of a difference in his pain.  He continues to wake up with burning, numbness and tingling.  I advised him to start taking 200 mg at night and if he continues to not see difference after 2 to 3 days to increase it to 300 mg at night.  I received confirmation today that he has an injection scheduled.  Will check back in after this has been completed.    This visit was performed via telephone.  Patient location: home Provider location: office  I spent a total of 5 minutes non-face-to-face activities for this visit on the date of this encounter including review of current clinical condition and response to treatment.  The patient is aware of and accepts the limits of this telehealth visit.

## 2024-10-14 ENCOUNTER — Encounter: Payer: Self-pay | Admitting: Student in an Organized Health Care Education/Training Program

## 2024-10-14 ENCOUNTER — Ambulatory Visit
Payer: Self-pay | Attending: Student in an Organized Health Care Education/Training Program | Admitting: Student in an Organized Health Care Education/Training Program

## 2024-10-14 VITALS — BP 110/78 | HR 81 | Temp 97.3°F | Resp 15 | Ht 77.0 in | Wt 335.0 lb

## 2024-10-14 DIAGNOSIS — G5711 Meralgia paresthetica, right lower limb: Secondary | ICD-10-CM | POA: Insufficient documentation

## 2024-10-14 DIAGNOSIS — M79604 Pain in right leg: Secondary | ICD-10-CM | POA: Insufficient documentation

## 2024-10-14 DIAGNOSIS — M79651 Pain in right thigh: Secondary | ICD-10-CM | POA: Insufficient documentation

## 2024-10-14 MED ORDER — LIDOCAINE HCL (PF) 2 % IJ SOLN
INTRAMUSCULAR | Status: AC
  Filled 2024-10-14: qty 10

## 2024-10-14 MED ORDER — DEXAMETHASONE SOD PHOSPHATE PF 10 MG/ML IJ SOLN
10.0000 mg | Freq: Once | INTRAMUSCULAR | Status: AC
  Administered 2024-10-14: 10 mg

## 2024-10-14 MED ORDER — ROPIVACAINE HCL 2 MG/ML IJ SOLN
INTRAMUSCULAR | Status: AC
  Filled 2024-10-14: qty 20

## 2024-10-14 MED ORDER — ROPIVACAINE HCL 2 MG/ML IJ SOLN
9.0000 mL | Freq: Once | INTRAMUSCULAR | Status: AC
  Administered 2024-10-14: 9 mL via PERINEURAL

## 2024-10-14 MED ORDER — LIDOCAINE HCL 2 % IJ SOLN
20.0000 mL | Freq: Once | INTRAMUSCULAR | Status: AC
  Administered 2024-10-14: 100 mg

## 2024-10-14 NOTE — Progress Notes (Signed)
 PROVIDER NOTE: Interpretation of information contained herein should be left to medically-trained personnel. Specific patient instructions are provided elsewhere under Patient Instructions section of medical record. This document was created in part using STT-dictation technology, any transcriptional errors that may result from this process are unintentional.  Patient: James Rivas Type: Established DOB: 10-31-1971 MRN: [id number] PCP: Tobie Suzzane POUR, MD  Service: Procedure DOS: 10/14/2024 Setting: Ambulatory Location: Ambulatory outpatient facility Delivery: Face-to-face Provider: Wallie Sherry, MD Specialty: Interventional Pain Management Specialty designation: 09 Location: Outpatient facility Ref. Prov.: Tobie Suzzane POUR, MD       Interventional Therapy   Primary Reason for Visit: Interventional Pain Management Treatment. CC: Leg Pain (Right, thigh)    Procedure:          Anesthesia, Analgesia, Anxiolysis:  Type: Right lateral femoral cutaneous nerve block Primary Purpose: Diagnostic Region: Groin Region Approach: Anterior approach Laterality: Right  Anesthesia: Local (1-2% Lidocaine )  Anxiolysis: None  Sedation: None  Guidance: Ultrasound           Position: Supine    1. Meralgia paresthetica of right side   2. Right leg pain    NAS-11 Pain score:   Pre-procedure: 2 /10   Post-procedure: 0-No pain/10     H&P (Pre-op Assessment):  Mr. Colclough is a 53 y.o. (year old), male patient, seen today for interventional treatment. He  has a past surgical history that includes I & D extremity (11/29/2011); Finger surgery (Left); Colonoscopy with propofol  (N/A, 05/11/2020); Knee arthroscopy with medial menisectomy (Left, 05/17/2021); and Menisectomy (Left, 05/17/2021). Mr. Mcneish has a current medication list which includes the following prescription(s): accu-chek softclix lancets, allopurinol , blood glucose meter kit and supplies, blood glucose monitoring suppl, celecoxib , chlorthalidone ,  cyclobenzaprine , lumbar back brace/support pad, enalapril , gabapentin, blood glucose test strips, onetouch verio, lancet device, loperamide , metoprolol  tartrate, misc. devices, mounjaro , omeprazole , ondansetron , nebulizer mask adult/tubing, and rosuvastatin , and the following Facility-Administered Medications: sodium hyaluronate (viscosup) and sodium hyaluronate (viscosup). His primarily concern today is the Leg Pain (Right, thigh)  Initial Vital Signs:  Pulse/HCG Rate: 81ECG Heart Rate: 74 Temp: (!) 97.3 F (36.3 C) Resp: 16 BP: 139/76 SpO2: 98 %  BMI: Estimated body mass index is 39.73 kg/m as calculated from the following:   Height as of this encounter: 6' 5 (1.956 m).   Weight as of this encounter: 335 lb (152 kg).  Risk Assessment: Allergies: Reviewed. He has no known allergies.  Allergy Precautions: None required Coagulopathies: Reviewed. None identified.  Blood-thinner therapy: None at this time Active Infection(s): Reviewed. None identified. Mr. Minor is afebrile  Site Confirmation: Mr. Gelin was asked to confirm the procedure and laterality before marking the site Procedure checklist: Completed Consent: Before the procedure and under the influence of no sedative(s), amnesic(s), or anxiolytics, the patient was informed of the treatment options, risks and possible complications. To fulfill our ethical and legal obligations, as recommended by the American Medical Association's Code of Ethics, I have informed the patient of my clinical impression; the nature and purpose of the treatment or procedure; the risks, benefits, and possible complications of the intervention; the alternatives, including doing nothing; the risk(s) and benefit(s) of the alternative treatment(s) or procedure(s); and the risk(s) and benefit(s) of doing nothing. The patient was provided information about the general risks and possible complications associated with the procedure. These may include, but are not limited  to: failure to achieve desired goals, infection, bleeding, organ or nerve damage, allergic reactions, paralysis, and death. In addition, the patient was informed  of those risks and complications associated to the procedure, such as failure to decrease pain; infection; bleeding; organ or nerve damage with subsequent damage to sensory, motor, and/or autonomic systems, resulting in permanent pain, numbness, and/or weakness of one or several areas of the body; allergic reactions; (i.e.: anaphylactic reaction); and/or death. Furthermore, the patient was informed of those risks and complications associated with the medications. These include, but are not limited to: allergic reactions (i.e.: anaphylactic or anaphylactoid reaction(s)); adrenal axis suppression; blood sugar elevation that in diabetics may result in ketoacidosis or comma; water retention that in patients with history of congestive heart failure may result in shortness of breath, pulmonary edema, and decompensation with resultant heart failure; weight gain; swelling or edema; medication-induced neural toxicity; particulate matter embolism and blood vessel occlusion with resultant organ, and/or nervous system infarction; and/or aseptic necrosis of one or more joints. Finally, the patient was informed that Medicine is not an exact science; therefore, there is also the possibility of unforeseen or unpredictable risks and/or possible complications that may result in a catastrophic outcome. The patient indicated having understood very clearly. We have given the patient no guarantees and we have made no promises. Enough time was given to the patient to ask questions, all of which were answered to the patient's satisfaction. Mr. Wich has indicated that he wanted to continue with the procedure. Attestation: I, the ordering provider, attest that I have discussed with the patient the benefits, risks, side-effects, alternatives, likelihood of achieving goals, and  potential problems during recovery for the procedure that I have provided informed consent. Date  Time: 10/14/2024  1:43 PM  Pre-Procedure Preparation:  Monitoring: As per clinic protocol. Respiration, ETCO2, SpO2, BP, heart rate and rhythm monitor placed and checked for adequate function Safety Precautions: Patient was assessed for positional comfort and pressure points before starting the procedure. Time-out: I initiated and conducted the Time-out before starting the procedure, as per protocol. The patient was asked to participate by confirming the accuracy of the Time Out information. Verification of the correct person, site, and procedure were performed and confirmed by me, the nursing staff, and the patient. Time-out conducted as per Joint Commission's Universal Protocol (UP.01.01.01). Time: 1421 Start Time: 1421 hrs.  Description of Procedure:          Area Prepped: Entire Anterolateral hip area. ChloraPrep (2% chlorhexidine  gluconate and 70% isopropyl alcohol) Safety Precautions: Aspiration looking for blood return was conducted prior to all injections. At no point did we inject any substances, as a needle was being advanced. No attempts were made at seeking any paresthesias. Safe injection practices and needle disposal techniques used. Medications properly checked for expiration dates. SDV (single dose vial) medications used.  Description of the Procedure (block of the LFCN) : Protocol guidelines were followed. The patient was placed in supine position over the procedure table. The entire groin area and upper thigh was prepped and draped in the usual manner.  The lateral femoral cutaneous nerve was identified and between the tensor fascia lata and the sartorius and was located 1.5 cm medial and inferior to the anterior superior iliac spine parallel to the ilioinguinal ligament.  Under ultrasound guidance using in-plane technique, 5 cc solution made of 4 cc of 0.2% ropivacaine 1 cc of  Decadron 10 mg/cc was injected for the right lateral femoral cutaneous nerve.   Vitals:   10/14/24 1347 10/14/24 1420 10/14/24 1433  BP: 139/76 122/73 110/78  Pulse: 81    Resp: 16 18 15   Temp: ROLLEN)  97.3 F (36.3 C)    TempSrc: Temporal    SpO2: 98% 97% 98%  Weight: (!) 335 lb (152 kg)    Height: 6' 5 (1.956 m)      Start Time: 1421 hrs. End Time: 1433 hrs.      Materials:  Needle(s) Type: Regular needle Gauge: 22G Length: 1.5-in Medication(s): Please see orders for medications and dosing details.  Imaging Guidance (Non-Spinal):          Type of Imaging Technique: ultrasound  Antibiotic Prophylaxis:   Anti-infectives (From admission, onward)    None      Indication(s): None identified  Post-operative Assessment:  Post-procedure Vital Signs:  Pulse/HCG Rate: 8177 Temp: (!) 97.3 F (36.3 C) Resp: 15 BP: 110/78 SpO2: 98 %  EBL: None  Complications: No immediate post-treatment complications observed by team, or reported by patient.  Note: The patient tolerated the entire procedure well. A repeat set of vitals were taken after the procedure and the patient was kept under observation following institutional policy, for this type of procedure. Post-procedural neurological assessment was performed, showing return to baseline, prior to discharge. The patient was provided with post-procedure discharge instructions, including a section on how to identify potential problems. Should any problems arise concerning this procedure, the patient was given instructions to immediately contact us , at any time, without hesitation. In any case, we plan to contact the patient by telephone for a follow-up status report regarding this interventional procedure.  Comments:  No additional relevant information.  Plan of Care (POC)  Orders:  No orders of the defined types were placed in this encounter.    Medications ordered for procedure: Meds ordered this encounter  Medications    lidocaine  (XYLOCAINE ) 2 % (with pres) injection 400 mg   dexamethasone (DECADRON) injection 10 mg   ropivacaine (PF) 2 mg/mL (0.2%) (NAROPIN) injection 9 mL   Medications administered: We administered lidocaine , dexamethasone, and ropivacaine (PF) 2 mg/mL (0.2%).  See the medical record for exact dosing, route, and time of administration.    R LFCN block    Follow-up plan:   Return in about 3 weeks (around 11/04/2024) for PPE, VV, Seema.     Recent Visits Date Type Provider Dept  10/07/24 Office Visit Marcelino Nurse, MD [facility]  Showing recent visits within past 90 days and meeting all other requirements Today's Visits Date Type Provider Dept  10/14/24 Procedure visit Marcelino Nurse, MD [facility]  Showing today's visits and meeting all other requirements Future Appointments Date Type Provider Dept  10/30/24 Appointment Patel, Seema K, NP [facility]  Showing future appointments within next 90 days and meeting all other requirements   Disposition: Discharge home  Discharge (Date  Time): 10/14/2024; 1445 hrs.   Primary Care Physician: Tobie Suzzane POUR, MD Location: [facility] Note by: Nurse Marcelino, MD (TTS technology used. I apologize for any typographical errors that were not detected and corrected.) Date: 10/14/2024; Time: 2:41 PM  Disclaimer:  Medicine is not an visual merchandiser. The only guarantee in medicine is that nothing is guaranteed. It is important to note that the decision to proceed with this intervention was based on the information collected from the patient. The Data and conclusions were drawn from the patient's questionnaire, the interview, and the physical examination. Because the information was provided in large part by the patient, it cannot be guaranteed that it has not been purposely or unconsciously manipulated. Every effort has been made to obtain as  much relevant data as possible for this  evaluation. It is important to note that the conclusions that lead to this procedure are derived in large part from the available data. Always take into account that the treatment will also be dependent on availability of resources and existing treatment guidelines, considered by other Pain Management Practitioners as being common knowledge and practice, at the time of the intervention. For Medico-Legal purposes, it is also important to point out that variation in procedural techniques and pharmacological choices are the acceptable norm. The indications, contraindications, technique, and results of the above procedure should only be interpreted and judged by a Board-Certified Interventional Pain Specialist with extensive familiarity and expertise in the same exact procedure and technique.

## 2024-10-14 NOTE — Patient Instructions (Signed)

## 2024-10-15 ENCOUNTER — Telehealth: Payer: Self-pay | Admitting: *Deleted

## 2024-10-15 NOTE — Telephone Encounter (Signed)
 Post procedure call:   no  questions or concerns.

## 2024-10-29 ENCOUNTER — Ambulatory Visit: Payer: Self-pay | Admitting: Physician Assistant

## 2024-10-29 DIAGNOSIS — G5711 Meralgia paresthetica, right lower limb: Secondary | ICD-10-CM

## 2024-10-29 NOTE — Progress Notes (Signed)
 Spoke with patient over the phone to get an update on his status.  He recently had an injection for his meralgia paresthetica which gave him complete relief for 3 days.  He is interested in surgery to try to make his pain relief more longstanding.  I will facilitate him meeting with the surgeon in clinic.  In addition, we discussed his gabapentin  and increasing his dosage to take during the day to try to give him more daytime coverage.  Concerns over drowsiness were discussed.  All questions were answered and concerns addressed.  Will have an appointment made with Dr. Claudene here shortly.     This visit was performed via telephone.  Patient location: home Provider location: office  I spent a total of 5 minutes non-face-to-face activities for this visit on the date of this encounter including review of current clinical condition and response to treatment.  The patient is aware of and accepts the limits of this telehealth visit.

## 2024-10-30 ENCOUNTER — Ambulatory Visit: Payer: Self-pay | Admitting: Nurse Practitioner

## 2024-11-04 ENCOUNTER — Telehealth: Payer: Self-pay | Admitting: Neurosurgery

## 2024-11-04 ENCOUNTER — Encounter: Payer: Self-pay | Admitting: Internal Medicine

## 2024-11-04 ENCOUNTER — Ambulatory Visit: Payer: Self-pay | Admitting: Internal Medicine

## 2024-11-04 VITALS — BP 114/76 | HR 84 | Resp 18 | Ht 77.0 in | Wt 336.0 lb

## 2024-11-04 DIAGNOSIS — E1169 Type 2 diabetes mellitus with other specified complication: Secondary | ICD-10-CM

## 2024-11-04 DIAGNOSIS — Z7985 Long-term (current) use of injectable non-insulin antidiabetic drugs: Secondary | ICD-10-CM

## 2024-11-04 DIAGNOSIS — E782 Mixed hyperlipidemia: Secondary | ICD-10-CM

## 2024-11-04 DIAGNOSIS — I10 Essential (primary) hypertension: Secondary | ICD-10-CM

## 2024-11-04 DIAGNOSIS — M51362 Other intervertebral disc degeneration, lumbar region with discogenic back pain and lower extremity pain: Secondary | ICD-10-CM

## 2024-11-04 DIAGNOSIS — G5711 Meralgia paresthetica, right lower limb: Secondary | ICD-10-CM

## 2024-11-04 MED ORDER — GLIPIZIDE ER 2.5 MG PO TB24: 2.5000 mg | ORAL_TABLET | Freq: Every day | ORAL | 3 refills | Status: AC

## 2024-11-04 NOTE — Assessment & Plan Note (Addendum)
 Anterolateral thigh area tingling and burning sensation likely due to meralgia paresthetica Advised to avoid tight fitting undergarments/clothing and tight belts Celebrex  as needed for pain On gabapentin  currently Has been evaluated by pain clinic - had nerve block, but did not help much Planned to see neurosurgeon for surgical option discussion Continue to follow low-carb diet to focus on weight loss

## 2024-11-04 NOTE — Patient Instructions (Signed)
   Please restart taking Mounjaro  once insurance starts.  Please continue to take medications as prescribed.  Please continue to follow low carb diet and perform moderate exercise/walking at least 150 mins/week.

## 2024-11-04 NOTE — Assessment & Plan Note (Signed)
 BP Readings from Last 1 Encounters:  10/14/24 110/78   Well-controlled with Enalapril , Chlorthalidone  and Metoprolol  Counseled for compliance with the medications Advised DASH diet and moderate exercise/walking, at least 150 mins/week

## 2024-11-04 NOTE — Progress Notes (Signed)
 Established Patient Office Visit  Subjective:  Patient ID: James Rivas, male    DOB: December 24, 1970  Age: 53 y.o. MRN: [id number]  CC:  Chief Complaint  Patient presents with   Diabetes    Follow up   Hypertension    Follow up   Leg Pain    Reports right leg pain.    HPI James Rivas is a 53 y.o. male with past medical history of HTN, type II DM with morbid obesity, OSA, gout and HLD who presents for chronic medical conditions.  He reports constant pain in the right thigh, lateral side.  He has been evaluated by pain clinic and neurosurgery for meralgia paresthetica.  He had nerve block, but did not have much relief.  He is taking gabapentin  without much relief. He takes Celebrex  as needed for back pain now.  He also has Flexeril  as needed for muscle spasms.  He currently denies saddle anesthesia, urinary or stool incontinence.  Type II DM: He was tolerating Mounjaro  10 mg qw, but has run out of it for the last 2 weeks due to lack of insurance. He is trying to follow small, frequent meals. He denies any polyuria or polyphagia currently.  Denies any recurrent episode of abdominal pain, nausea or vomiting since the last visit.  He has not had updated blood tests as well.  HTN: BP is well-controlled. Takes medications regularly. Patient denies headache, dizziness, chest pain, dyspnea or palpitations.  Left knee pain: He has had left knee arthroscopy and had Supartz injection. He has noted improvement in left knee pain.     Past Medical History:  Diagnosis Date   Allergy    Arthritis    left knee   Chest pain    Chronic chest pain 06/22/2013   Diabetes mellitus without complication (HCC)    Gout    Gout    History of COVID-19 12/20/2020   HTN (hypertension)    Impaired fasting glucose    Morbid obesity (HCC)    Sleep apnea    uses a CPAP   Venous stasis     Past Surgical History:  Procedure Laterality Date   COLONOSCOPY WITH PROPOFOL  N/A 05/11/2020   Procedure: COLONOSCOPY  WITH PROPOFOL ;  Surgeon: Shaaron Lamar HERO, MD;  Location: [facility];  Service: Endoscopy;  Laterality: N/A;  2:15pm   FINGER SURGERY Left    left ring finger 2 surgeries   I & D EXTREMITY  11/29/2011   Procedure: IRRIGATION AND DEBRIDEMENT EXTREMITY;  Surgeon: Prentice LELON Pagan;  Location: [facility];  Service: Orthopedics;  Laterality: Left;  I&D Right Long and Index Fingers with Tendon Repair as Needed   KNEE ARTHROSCOPY WITH MEDIAL MENISECTOMY Left 05/17/2021   Procedure: KNEE ARTHROSCOPY WITH MICROFRACTURE;  Surgeon: Margrette Taft BRAVO, MD;  Location: [facility];  Service: Orthopedics;  Laterality: Left;   MENISECTOMY Left 05/17/2021   Procedure: PARTIAL MEDIAL MENISECTOMY;  Surgeon: Margrette Taft BRAVO, MD;  Location: [facility];  Service: Orthopedics;  Laterality: Left;    Family History  Problem Relation Age of Onset   Diabetes Father    Diabetes Paternal Aunt    Colon cancer Neg Hx     Social History   Socioeconomic History   Marital status: Married    Spouse name: Not on file   Number of children: 3   Years of education: Not on file   Highest education level: Not on file  Occupational History   Occupation: [employer]  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 1 beer or a glass of wine a couple times a week.    Drug use: No   Sexual activity: Never  Other Topics Concern   Not on file  Social History Narrative   Not on file   Social Drivers of Health   Financial Resource Strain: Not on file  Food Insecurity: Not on file  Transportation Needs: Not on file  Physical Activity: Not on file  Stress: Not on file  Social Connections: Unknown (04/11/2022)   Received from [facility]   Social Network    Social Network: Not on file  Intimate Partner Violence: Unknown (03/03/2022)   Received from [facility]    HITS    Physically Hurt: Not on file    Insult or Talk Down To: Not on file    Threaten Physical Harm: Not on file    Scream or Curse: Not on file    Outpatient Medications Prior to Visit  Medication Sig Dispense Refill   Accu-Chek Softclix Lancets lancets Use as instructed 100 each 12   allopurinol  (ZYLOPRIM ) 300 MG tablet Take 1 tablet (300 mg total) by mouth daily. 90 tablet 3   Blood Glucose Monitoring Suppl DEVI 1 each by Does not apply route 3 (three) times daily. May substitute to any manufacturer covered by patient's insurance. 1 each 0   celecoxib  (CELEBREX ) 100 MG capsule Take 1 capsule (100 mg total) by mouth 2 (two) times daily as needed for mild pain (pain score 1-3) or moderate pain (pain score 4-6). 60 capsule 1   chlorthalidone  (HYGROTON ) 25 MG tablet TAKE 1 TABLET(25 MG) BY MOUTH DAILY 90 tablet 1   cyclobenzaprine  (FLEXERIL ) 5 MG tablet Take 1 tablet (5 mg total) by mouth 2 (two) times daily as needed for muscle spasms. 30 tablet 1   Elastic Bandages & Supports (LUMBAR BACK BRACE/SUPPORT PAD) MISC Use it as directed for back pain. ICD10: M51.369 2 each 0   enalapril  (VASOTEC ) 20 MG tablet TAKE 1 TABLET(20 MG) BY MOUTH DAILY 90 tablet 3   gabapentin  (NEURONTIN ) 100 MG capsule Take 1-3 capsules (100-300 mg total) by mouth at bedtime. Start by taking 1 capsule at night for one week. Then you may increase to two capsules at night, or increase to 3 90 capsule 1   Glucose Blood (BLOOD GLUCOSE TEST STRIPS) STRP 1 each by In Vitro route in the morning, at noon, and at bedtime. May substitute to any manufacturer covered by patient's insurance. 100 strip 5   glucose blood (ONETOUCH VERIO) test strip USE TO CHECK BLOOD SUGAR DAILY 100 strip 1   Lancet Device MISC Three times daily DX e11.65 May substitute to any manufacturer covered by patient's insurance. 1 each 0   loperamide  (IMODIUM ) 2 MG capsule Take 1 capsule (2 mg total) by mouth 4 (four) times daily as needed for diarrhea or loose  stools. 12 capsule 0   metoprolol  tartrate (LOPRESSOR ) 25 MG tablet Take 0.5 tablets (12.5 mg total) by mouth 2 (two) times daily. 90 tablet 3   Misc. Devices KIT CPAP nose piece and heated tubing. 1 kit 0   MOUNJARO  10 MG/0.5ML Pen ADMINISTER 10 MG UNDER THE SKIN 1 TIME A WEEK 2 mL 5   omeprazole  (PRILOSEC) 20 MG capsule  Take 1 capsule (20 mg total) by mouth daily. 30 capsule 5   ondansetron  (ZOFRAN ) 4 MG tablet Take 1 tablet (4 mg total) by mouth every 8 (eight) hours as needed for nausea or vomiting. 20 tablet 0   Respiratory Therapy Supplies (NEBULIZER MASK ADULT/TUBING) MISC 1 each by Does not apply route at bedtime. 1 each 11   rosuvastatin  (CRESTOR ) 10 MG tablet TAKE 1 TABLET(10 MG) BY MOUTH DAILY 90 tablet 3   blood glucose meter kit and supplies Dispense based on patient and insurance preference. Use daily to check blood sugar.. (FOR ICD-10 E10.9, E11.9). 1 each 0   Facility-Administered Medications Prior to Visit  Medication Dose Route Frequency Provider Last Rate Last Admin   Sodium Hyaluronate (Viscosup) SOSY 2.5 mL  2.5 mL Intra-articular Once Harrison, Stanley E, MD       Sodium Hyaluronate (Viscosup) SOSY   Intra-articular Once Margrette Taft BRAVO, MD        No Known Allergies  ROS Review of Systems  Constitutional:  Negative for chills and fever.  HENT:  Negative for congestion and sore throat.   Eyes:  Negative for pain and discharge.  Respiratory:  Negative for cough and shortness of breath.   Cardiovascular:  Negative for chest pain and palpitations.  Gastrointestinal:  Negative for diarrhea, nausea and vomiting.  Endocrine: Negative for polydipsia and polyuria.  Genitourinary:  Negative for dysuria and hematuria.  Musculoskeletal:  Positive for arthralgias and back pain. Negative for neck pain and neck stiffness.  Skin:  Negative for rash.  Neurological:  Negative for dizziness and weakness.  Psychiatric/Behavioral:  Negative for agitation and behavioral problems.        Objective:    Physical Exam Vitals reviewed.  Constitutional:      General: He is not in acute distress.    Appearance: He is obese. He is not diaphoretic.  HENT:     Head: Normocephalic and atraumatic.     Nose: Nose normal.     Mouth/Throat:     Mouth: Mucous membranes are moist.  Eyes:     General: No scleral icterus.    Extraocular Movements: Extraocular movements intact.  Cardiovascular:     Rate and Rhythm: Normal rate and regular rhythm.     Heart sounds: Normal heart sounds. No murmur heard. Pulmonary:     Breath sounds: Normal breath sounds. No wheezing or rales.  Musculoskeletal:     Cervical back: Neck supple. No tenderness.     Lumbar back: Tenderness present. Normal range of motion. Negative right straight leg raise test and negative left straight leg raise test.     Right lower leg: Edema (1+) present.     Left lower leg: Edema (1+) present.  Skin:    General: Skin is warm.     Findings: Rash (stasis dermatitis b/l) present.  Neurological:     General: No focal deficit present.     Mental Status: He is alert and oriented to person, place, and time.     Sensory: No sensory deficit.     Motor: No weakness.  Psychiatric:        Mood and Affect: Mood normal.        Behavior: Behavior normal.     Ht 6' 5 (1.956 m)   BMI 39.73 kg/m  Wt Readings from Last 3 Encounters:  10/14/24 (!) 335 lb (152 kg)  10/07/24 (!) 335 lb (152 kg)  09/24/24 (!) 357 lb (161.9 kg)    Lab Results  Component Value Date   TSH 1.640 02/16/2023   Lab Results  Component Value Date   WBC 6.1 02/16/2023   HGB 15.2 02/16/2023   HCT 45.7 02/16/2023   MCV 80 02/16/2023   PLT 206 02/16/2023   Lab Results  Component Value Date   NA 141 10/24/2023   K 4.4 10/24/2023   CO2 25 10/24/2023   GLUCOSE 80 10/24/2023   BUN 21 10/24/2023   CREATININE 1.06 10/24/2023   BILITOT 0.6 10/24/2023   ALKPHOS 62 10/24/2023   AST 20 10/24/2023   ALT 17 10/24/2023   PROT 7.5  10/24/2023   ALBUMIN 4.4 10/24/2023   CALCIUM  9.6 10/24/2023   ANIONGAP 8 09/03/2022   EGFR 84 10/24/2023   Lab Results  Component Value Date   CHOL 116 10/24/2023   Lab Results  Component Value Date   HDL 43 10/24/2023   Lab Results  Component Value Date   LDLCALC 58 10/24/2023   Lab Results  Component Value Date   TRIG 75 10/24/2023   Lab Results  Component Value Date   CHOLHDL 2.7 10/24/2023   Lab Results  Component Value Date   HGBA1C 5.5 02/28/2024      Assessment & Plan:   Problem List Items Addressed This Visit       Cardiovascular and Mediastinum   HTN (hypertension) - Primary (Chronic)   BP Readings from Last 1 Encounters:  10/14/24 110/78   Well-controlled with Enalapril , Chlorthalidone  and Metoprolol  Counseled for compliance with the medications Advised DASH diet and moderate exercise/walking, at least 150 mins/week        Endocrine   Type 2 diabetes mellitus with morbid obesity (HCC)   Lab Results  Component Value Date   HGBA1C 5.5 02/28/2024   Well controlled, but could be worse now due to running out of Mounjaro  Started glipizide  2.5 mg QD for now, did not tolerate metformin  in the past Would prefer to continue Mounjaro  10 mg qw once he gets insurance Advised to follow diabetic diet On ACEi and statin F/u CMP and lipid panel Diabetic eye exam: Advised to follow up with Ophthalmology for diabetic eye exam      Relevant Medications   glipiZIDE  (GLUCOTROL  XL) 2.5 MG 24 hr tablet   Other Relevant Orders   Microalbumin / creatinine urine ratio     Nervous and Auditory   Meralgia paresthetica of right side   Anterolateral thigh area tingling and burning sensation likely due to meralgia paresthetica Advised to avoid tight fitting undergarments/clothing and tight belts Celebrex  as needed for pain On gabapentin  currently Has been evaluated by pain clinic - had nerve block, but did not help much Planned to see neurosurgeon for surgical  option discussion Continue to follow low-carb diet to focus on weight loss        Musculoskeletal and Integument   DDD (degenerative disc disease), lumbar   Recent x-ray of lumbar spine reviewed, showed degenerative changes with marginal osteophytes L3-4 and L4-5. Followed by neurosurgery Advised to perform simple back exercises at home Avoid heavy lifting and frequent bending Continue gabapentin  for neuropathic symptoms        Other   Mixed hyperlipidemia (Chronic)   On Crestor  Check lipid profile         Meds ordered this encounter  Medications   glipiZIDE  (GLUCOTROL  XL) 2.5 MG 24 hr tablet    Sig: Take 1 tablet (2.5 mg total) by mouth daily with breakfast.    Dispense:  30 tablet  Refill:  3    Follow-up: Return in about 4 months (around 03/05/2025) for DM.    Suzzane MARLA Blanch, MD

## 2024-11-04 NOTE — Assessment & Plan Note (Signed)
On Crestor Check lipid profile 

## 2024-11-04 NOTE — Assessment & Plan Note (Signed)
 Recent x-ray of lumbar spine reviewed, showed degenerative changes with marginal osteophytes L3-4 and L4-5. Followed by neurosurgery Advised to perform simple back exercises at home Avoid heavy lifting and frequent bending Continue gabapentin  for neuropathic symptoms

## 2024-11-04 NOTE — Assessment & Plan Note (Addendum)
 Lab Results  Component Value Date   HGBA1C 5.5 02/28/2024   Well controlled, but could be worse now due to running out of Mounjaro  Started glipizide  2.5 mg QD for now, did not tolerate metformin  in the past Would prefer to continue Mounjaro  10 mg qw once he gets insurance Advised to follow diabetic diet On ACEi and statin F/u CMP and lipid panel Diabetic eye exam: Advised to follow up with Ophthalmology for diabetic eye exam

## 2024-11-25 ENCOUNTER — Other Ambulatory Visit: Payer: Self-pay | Admitting: Internal Medicine

## 2024-11-25 DIAGNOSIS — I493 Ventricular premature depolarization: Secondary | ICD-10-CM

## 2024-12-11 ENCOUNTER — Encounter: Payer: Self-pay | Admitting: Internal Medicine

## 2025-01-01 ENCOUNTER — Inpatient Hospital Stay
Admission: RE | Admit: 2025-01-01 | Discharge: 2025-01-01 | Attending: Physician Assistant | Admitting: Physician Assistant

## 2025-01-01 DIAGNOSIS — D492 Neoplasm of unspecified behavior of bone, soft tissue, and skin: Secondary | ICD-10-CM

## 2025-03-10 ENCOUNTER — Ambulatory Visit: Payer: Self-pay | Admitting: Internal Medicine
